# Patient Record
Sex: Male | Born: 1940 | Race: White | Hispanic: No | Marital: Married | Smoking: Former smoker
Health system: Southern US, Community
[De-identification: ages and names within clinical notes are randomized; demographics above are authoritative.]

## PROBLEM LIST (undated history)

## (undated) DIAGNOSIS — I2699 Other pulmonary embolism without acute cor pulmonale: Secondary | ICD-10-CM

## (undated) DIAGNOSIS — Z789 Other specified health status: Secondary | ICD-10-CM

## (undated) DIAGNOSIS — I10 Essential (primary) hypertension: Secondary | ICD-10-CM

## (undated) DIAGNOSIS — R760 Raised antibody titer: Secondary | ICD-10-CM

## (undated) DIAGNOSIS — J449 Chronic obstructive pulmonary disease, unspecified: Secondary | ICD-10-CM

## (undated) DIAGNOSIS — I2510 Atherosclerotic heart disease of native coronary artery without angina pectoris: Secondary | ICD-10-CM

## (undated) DIAGNOSIS — E785 Hyperlipidemia, unspecified: Secondary | ICD-10-CM

## (undated) DIAGNOSIS — E119 Type 2 diabetes mellitus without complications: Secondary | ICD-10-CM

## (undated) DIAGNOSIS — R911 Solitary pulmonary nodule: Secondary | ICD-10-CM

## (undated) DIAGNOSIS — K219 Gastro-esophageal reflux disease without esophagitis: Secondary | ICD-10-CM

## (undated) DIAGNOSIS — K8020 Calculus of gallbladder without cholecystitis without obstruction: Secondary | ICD-10-CM

## (undated) DIAGNOSIS — I272 Pulmonary hypertension, unspecified: Secondary | ICD-10-CM

## (undated) DIAGNOSIS — G4733 Obstructive sleep apnea (adult) (pediatric): Secondary | ICD-10-CM

## (undated) HISTORY — DX: Obstructive sleep apnea (adult) (pediatric): G47.33

## (undated) HISTORY — DX: Essential (primary) hypertension: I10

## (undated) HISTORY — PX: OTHER SURGICAL HISTORY: SHX169

## (undated) HISTORY — DX: Chronic obstructive pulmonary disease, unspecified: J44.9

## (undated) HISTORY — DX: Atherosclerotic heart disease of native coronary artery without angina pectoris: I25.10

## (undated) HISTORY — PX: COLONOSCOPY: SHX174

## (undated) HISTORY — DX: Hyperlipidemia, unspecified: E78.5

## (undated) HISTORY — DX: Other specified health status: Z78.9

## (undated) HISTORY — DX: Type 2 diabetes mellitus without complications: E11.9

---

## 2003-07-16 HISTORY — PX: CORONARY ARTERY BYPASS GRAFT: SHX141

## 2004-03-06 ENCOUNTER — Inpatient Hospital Stay (HOSPITAL_COMMUNITY): Admission: EM | Admit: 2004-03-06 | Discharge: 2004-03-19 | Payer: Self-pay | Admitting: Emergency Medicine

## 2004-03-14 ENCOUNTER — Encounter (INDEPENDENT_AMBULATORY_CARE_PROVIDER_SITE_OTHER): Payer: Self-pay | Admitting: Specialist

## 2004-05-16 ENCOUNTER — Ambulatory Visit: Payer: Self-pay | Admitting: Family Medicine

## 2004-06-26 ENCOUNTER — Ambulatory Visit: Payer: Self-pay | Admitting: Family Medicine

## 2004-07-02 ENCOUNTER — Ambulatory Visit: Payer: Self-pay | Admitting: Cardiology

## 2004-08-13 ENCOUNTER — Ambulatory Visit: Payer: Self-pay | Admitting: Family Medicine

## 2004-09-24 ENCOUNTER — Ambulatory Visit: Payer: Self-pay | Admitting: Family Medicine

## 2004-10-03 ENCOUNTER — Ambulatory Visit: Payer: Self-pay | Admitting: Family Medicine

## 2005-01-03 ENCOUNTER — Ambulatory Visit: Payer: Self-pay | Admitting: Family Medicine

## 2005-01-30 ENCOUNTER — Ambulatory Visit: Payer: Self-pay | Admitting: Family Medicine

## 2005-04-09 ENCOUNTER — Ambulatory Visit: Payer: Self-pay | Admitting: Family Medicine

## 2005-04-16 ENCOUNTER — Ambulatory Visit: Payer: Self-pay | Admitting: Family Medicine

## 2005-05-02 ENCOUNTER — Ambulatory Visit: Payer: Self-pay | Admitting: Cardiology

## 2005-05-02 ENCOUNTER — Inpatient Hospital Stay (HOSPITAL_COMMUNITY): Admission: EM | Admit: 2005-05-02 | Discharge: 2005-05-08 | Payer: Self-pay | Admitting: Emergency Medicine

## 2005-05-05 ENCOUNTER — Ambulatory Visit: Payer: Self-pay | Admitting: Cardiovascular Disease

## 2005-05-15 ENCOUNTER — Ambulatory Visit: Payer: Self-pay | Admitting: Cardiology

## 2005-05-19 ENCOUNTER — Inpatient Hospital Stay (HOSPITAL_COMMUNITY): Admission: AD | Admit: 2005-05-19 | Discharge: 2005-05-20 | Payer: Self-pay | Admitting: Cardiology

## 2005-05-29 ENCOUNTER — Ambulatory Visit: Payer: Self-pay | Admitting: Family Medicine

## 2005-06-20 ENCOUNTER — Ambulatory Visit: Payer: Self-pay | Admitting: Cardiology

## 2005-07-10 ENCOUNTER — Ambulatory Visit: Payer: Self-pay | Admitting: Family Medicine

## 2005-07-16 ENCOUNTER — Ambulatory Visit: Payer: Self-pay | Admitting: Family Medicine

## 2005-10-02 ENCOUNTER — Ambulatory Visit: Payer: Self-pay | Admitting: Cardiology

## 2005-10-22 ENCOUNTER — Ambulatory Visit: Payer: Self-pay | Admitting: Family Medicine

## 2005-11-12 ENCOUNTER — Ambulatory Visit: Payer: Self-pay | Admitting: Family Medicine

## 2005-11-21 ENCOUNTER — Ambulatory Visit: Payer: Self-pay | Admitting: Family Medicine

## 2006-01-21 ENCOUNTER — Ambulatory Visit: Payer: Self-pay | Admitting: Family Medicine

## 2006-02-27 ENCOUNTER — Encounter: Admission: RE | Admit: 2006-02-27 | Discharge: 2006-02-27 | Payer: Self-pay | Admitting: Neurosurgery

## 2006-03-25 ENCOUNTER — Encounter: Admission: RE | Admit: 2006-03-25 | Discharge: 2006-03-25 | Payer: Self-pay | Admitting: Neurosurgery

## 2006-04-30 ENCOUNTER — Ambulatory Visit: Payer: Self-pay | Admitting: Family Medicine

## 2006-05-14 ENCOUNTER — Ambulatory Visit: Payer: Self-pay | Admitting: Cardiology

## 2006-05-26 ENCOUNTER — Ambulatory Visit: Payer: Self-pay | Admitting: Cardiology

## 2006-06-09 ENCOUNTER — Ambulatory Visit: Payer: Self-pay | Admitting: Family Medicine

## 2006-06-19 ENCOUNTER — Inpatient Hospital Stay (HOSPITAL_COMMUNITY): Admission: RE | Admit: 2006-06-19 | Discharge: 2006-06-21 | Payer: Self-pay | Admitting: Neurosurgery

## 2006-08-05 ENCOUNTER — Ambulatory Visit: Payer: Self-pay | Admitting: Family Medicine

## 2006-08-19 ENCOUNTER — Ambulatory Visit: Payer: Self-pay | Admitting: Cardiology

## 2006-08-20 ENCOUNTER — Ambulatory Visit: Payer: Self-pay | Admitting: Cardiology

## 2006-08-20 ENCOUNTER — Observation Stay (HOSPITAL_COMMUNITY): Admission: AD | Admit: 2006-08-20 | Discharge: 2006-08-20 | Payer: Self-pay | Admitting: Cardiology

## 2006-08-25 ENCOUNTER — Ambulatory Visit: Payer: Self-pay | Admitting: Family Medicine

## 2006-08-28 ENCOUNTER — Ambulatory Visit: Payer: Self-pay | Admitting: Cardiology

## 2006-09-02 ENCOUNTER — Ambulatory Visit: Payer: Self-pay | Admitting: Family Medicine

## 2006-09-17 ENCOUNTER — Ambulatory Visit: Payer: Self-pay | Admitting: Family Medicine

## 2006-10-15 ENCOUNTER — Ambulatory Visit: Payer: Self-pay | Admitting: Family Medicine

## 2006-11-05 ENCOUNTER — Ambulatory Visit: Payer: Self-pay | Admitting: Family Medicine

## 2006-11-12 ENCOUNTER — Ambulatory Visit: Payer: Self-pay | Admitting: Family Medicine

## 2007-02-11 ENCOUNTER — Ambulatory Visit (HOSPITAL_COMMUNITY): Admission: RE | Admit: 2007-02-11 | Discharge: 2007-02-11 | Payer: Self-pay | Admitting: Neurosurgery

## 2007-08-27 ENCOUNTER — Encounter: Payer: Self-pay | Admitting: Cardiology

## 2008-06-02 ENCOUNTER — Encounter: Payer: Self-pay | Admitting: Cardiology

## 2008-06-02 ENCOUNTER — Ambulatory Visit: Payer: Self-pay | Admitting: Cardiology

## 2008-06-02 DIAGNOSIS — R05 Cough: Secondary | ICD-10-CM | POA: Insufficient documentation

## 2008-06-02 DIAGNOSIS — I2510 Atherosclerotic heart disease of native coronary artery without angina pectoris: Secondary | ICD-10-CM | POA: Insufficient documentation

## 2008-06-02 DIAGNOSIS — R55 Syncope and collapse: Secondary | ICD-10-CM | POA: Insufficient documentation

## 2008-06-13 ENCOUNTER — Ambulatory Visit: Payer: Self-pay | Admitting: Cardiology

## 2008-06-29 ENCOUNTER — Ambulatory Visit: Payer: Self-pay | Admitting: Cardiology

## 2008-07-01 ENCOUNTER — Inpatient Hospital Stay (HOSPITAL_BASED_OUTPATIENT_CLINIC_OR_DEPARTMENT_OTHER): Admission: RE | Admit: 2008-07-01 | Discharge: 2008-07-01 | Payer: Self-pay | Admitting: Internal Medicine

## 2008-07-01 ENCOUNTER — Ambulatory Visit: Payer: Self-pay | Admitting: Internal Medicine

## 2008-07-14 ENCOUNTER — Ambulatory Visit: Payer: Self-pay | Admitting: Physician Assistant

## 2008-11-03 ENCOUNTER — Encounter: Payer: Self-pay | Admitting: Cardiology

## 2008-11-03 ENCOUNTER — Emergency Department (HOSPITAL_COMMUNITY): Admission: EM | Admit: 2008-11-03 | Discharge: 2008-11-03 | Payer: Self-pay | Admitting: Emergency Medicine

## 2008-11-21 ENCOUNTER — Ambulatory Visit: Payer: Self-pay | Admitting: Cardiology

## 2009-02-02 ENCOUNTER — Telehealth: Payer: Self-pay | Admitting: Cardiology

## 2009-04-11 ENCOUNTER — Ambulatory Visit: Payer: Self-pay | Admitting: Cardiology

## 2009-04-11 DIAGNOSIS — E119 Type 2 diabetes mellitus without complications: Secondary | ICD-10-CM

## 2009-04-11 DIAGNOSIS — Z87891 Personal history of nicotine dependence: Secondary | ICD-10-CM

## 2009-04-11 DIAGNOSIS — J4489 Other specified chronic obstructive pulmonary disease: Secondary | ICD-10-CM | POA: Insufficient documentation

## 2009-04-11 DIAGNOSIS — Z951 Presence of aortocoronary bypass graft: Secondary | ICD-10-CM | POA: Insufficient documentation

## 2009-04-11 DIAGNOSIS — I252 Old myocardial infarction: Secondary | ICD-10-CM

## 2009-04-11 DIAGNOSIS — R072 Precordial pain: Secondary | ICD-10-CM

## 2009-04-11 DIAGNOSIS — I2510 Atherosclerotic heart disease of native coronary artery without angina pectoris: Secondary | ICD-10-CM

## 2009-04-11 DIAGNOSIS — R0602 Shortness of breath: Secondary | ICD-10-CM

## 2009-04-11 DIAGNOSIS — I10 Essential (primary) hypertension: Secondary | ICD-10-CM

## 2009-04-11 DIAGNOSIS — J449 Chronic obstructive pulmonary disease, unspecified: Secondary | ICD-10-CM

## 2009-04-11 DIAGNOSIS — I200 Unstable angina: Secondary | ICD-10-CM

## 2009-04-11 DIAGNOSIS — K219 Gastro-esophageal reflux disease without esophagitis: Secondary | ICD-10-CM

## 2009-04-11 DIAGNOSIS — Z9861 Coronary angioplasty status: Secondary | ICD-10-CM | POA: Insufficient documentation

## 2009-04-11 DIAGNOSIS — R0789 Other chest pain: Secondary | ICD-10-CM

## 2009-04-19 ENCOUNTER — Telehealth (INDEPENDENT_AMBULATORY_CARE_PROVIDER_SITE_OTHER): Payer: Self-pay | Admitting: *Deleted

## 2009-10-24 ENCOUNTER — Ambulatory Visit: Payer: Self-pay | Admitting: Cardiology

## 2010-06-12 ENCOUNTER — Ambulatory Visit: Payer: Self-pay | Admitting: Cardiology

## 2010-07-18 ENCOUNTER — Inpatient Hospital Stay (HOSPITAL_COMMUNITY)
Admission: AD | Admit: 2010-07-18 | Discharge: 2010-07-20 | Payer: Self-pay | Source: Home / Self Care | Attending: Cardiovascular Disease | Admitting: Cardiovascular Disease

## 2010-07-18 LAB — HEPARIN LEVEL (UNFRACTIONATED): Heparin Unfractionated: 0.3 IU/mL (ref 0.30–0.70)

## 2010-07-18 LAB — GLUCOSE, CAPILLARY
Glucose-Capillary: 97 mg/dL (ref 70–99)
Glucose-Capillary: 156 mg/dL — ABNORMAL HIGH (ref 70–99)

## 2010-07-19 LAB — GLUCOSE, CAPILLARY
Glucose-Capillary: 118 mg/dL — ABNORMAL HIGH (ref 70–99)
Glucose-Capillary: 107 mg/dL — ABNORMAL HIGH (ref 70–99)
Glucose-Capillary: 104 mg/dL — ABNORMAL HIGH (ref 70–99)
Glucose-Capillary: 92 mg/dL (ref 70–99)
Glucose-Capillary: 192 mg/dL — ABNORMAL HIGH (ref 70–99)

## 2010-07-19 LAB — CBC
HCT: 39.5 % (ref 39.0–52.0)
Hemoglobin: 13 g/dL (ref 13.0–17.0)
MCH: 29.6 pg (ref 26.0–34.0)
MCHC: 32.9 g/dL (ref 30.0–36.0)
MCV: 90 fL (ref 78.0–100.0)
Platelets: 227 10*3/uL (ref 150–400)
RBC: 4.39 MIL/uL (ref 4.22–5.81)
RDW: 14.7 % (ref 11.5–15.5)
WBC: 13.3 10*3/uL — ABNORMAL HIGH (ref 4.0–10.5)

## 2010-07-19 LAB — BASIC METABOLIC PANEL
BUN: 23 mg/dL (ref 6–23)
CO2: 25 mEq/L (ref 19–32)
Calcium: 8.1 mg/dL — ABNORMAL LOW (ref 8.4–10.5)
Chloride: 103 mEq/L (ref 96–112)
Creatinine, Ser: 1.33 mg/dL (ref 0.4–1.5)
GFR calc Af Amer: >60 mL/min (ref >60)
GFR calc non Af Amer: 53 mL/min — ABNORMAL LOW (ref >60)
Glucose, Bld: 122 mg/dL — ABNORMAL HIGH (ref 70–99)
Potassium: 3.8 mEq/L (ref 3.5–5.1)
Sodium: 135 mEq/L (ref 135–145)

## 2010-07-19 LAB — DIFFERENTIAL
Basophils Absolute: 0.1 10*3/uL (ref 0.0–0.1)
Basophils Relative: 1 % (ref 0–1)
Eosinophils Absolute: 0.1 10*3/uL (ref 0.0–0.7)
Eosinophils Relative: 1 % (ref 0–5)
Lymphocytes Relative: 21 % (ref 12–46)
Lymphs Abs: 2.8 10*3/uL (ref 0.7–4.0)
Monocytes Absolute: 1.3 10*3/uL — ABNORMAL HIGH (ref 0.1–1.0)
Monocytes Relative: 10 % (ref 3–12)
Neutro Abs: 9 10*3/uL — ABNORMAL HIGH (ref 1.7–7.7)
Neutrophils Relative %: 67 % (ref 43–77)

## 2010-07-19 LAB — PROTIME-INR
INR: 1.06 (ref 0.00–1.49)
Prothrombin Time: 14 seconds (ref 11.6–15.2)

## 2010-07-19 LAB — CARDIAC PANEL(CRET KIN+CKTOT+MB+TROPI)
CK, MB: 6.1 ng/mL (ref 0.3–4.0)
Relative Index: 0.9 (ref 0.0–2.5)
Total CK: 689 U/L — ABNORMAL HIGH (ref 7–232)
Troponin I: 2.73 ng/mL (ref 0.00–0.06)

## 2010-07-19 LAB — PLATELET INHIBITION P2Y12
P2Y12 % Inhibition: 0 %
Platelet Function  P2Y12: 243 [PRU] (ref 194–418)
Platelet Function Baseline: 238 [PRU] (ref 194–418)

## 2010-07-19 LAB — HEPARIN LEVEL (UNFRACTIONATED): Heparin Unfractionated: 0.42 IU/mL (ref 0.30–0.70)

## 2010-07-20 ENCOUNTER — Encounter: Payer: Self-pay | Admitting: Cardiology

## 2010-07-20 ENCOUNTER — Encounter: Payer: Self-pay | Admitting: Cardiovascular Disease

## 2010-07-20 LAB — BASIC METABOLIC PANEL
BUN: 21 mg/dL (ref 6–23)
CO2: 26 mEq/L (ref 19–32)
Calcium: 8.4 mg/dL (ref 8.4–10.5)
Chloride: 101 mEq/L (ref 96–112)
Creatinine, Ser: 1.26 mg/dL (ref 0.4–1.5)
GFR calc Af Amer: >60 mL/min (ref >60)
GFR calc non Af Amer: 57 mL/min — ABNORMAL LOW (ref >60)
Glucose, Bld: 104 mg/dL — ABNORMAL HIGH (ref 70–99)
Potassium: 3.9 mEq/L (ref 3.5–5.1)
Sodium: 135 mEq/L (ref 135–145)

## 2010-07-20 LAB — GLUCOSE, CAPILLARY
Glucose-Capillary: 121 mg/dL — ABNORMAL HIGH (ref 70–99)
Glucose-Capillary: 162 mg/dL — ABNORMAL HIGH (ref 70–99)

## 2010-07-20 LAB — CBC
HCT: 39.8 % (ref 39.0–52.0)
Hemoglobin: 12.8 g/dL — ABNORMAL LOW (ref 13.0–17.0)
MCH: 29.4 pg (ref 26.0–34.0)
MCHC: 32.2 g/dL (ref 30.0–36.0)
MCV: 91.3 fL (ref 78.0–100.0)
Platelets: 191 10*3/uL (ref 150–400)
RBC: 4.36 MIL/uL (ref 4.22–5.81)
RDW: 14.6 % (ref 11.5–15.5)
WBC: 8.6 10*3/uL (ref 4.0–10.5)

## 2010-07-30 LAB — POCT I-STAT 3, VENOUS BLOOD GAS (G3P V)
Acid-base deficit: 2 mmol/L (ref 0.0–2.0)
Bicarbonate: 22.8 mEq/L (ref 20.0–24.0)
O2 Saturation: 67 %
TCO2: 24 mmol/L (ref 0–100)
pCO2, Ven: 37.8 mmHg — ABNORMAL LOW (ref 45.0–50.0)
pH, Ven: 7.389 — ABNORMAL HIGH (ref 7.250–7.300)
pO2, Ven: 35 mmHg (ref 30.0–45.0)

## 2010-07-30 LAB — POCT I-STAT 3, ART BLOOD GAS (G3+)
Acid-base deficit: 2 mmol/L (ref 0.0–2.0)
Bicarbonate: 22.7 mEq/L (ref 20.0–24.0)
O2 Saturation: 97 %
TCO2: 24 mmol/L (ref 0–100)
pCO2 arterial: 36.1 mmHg (ref 35.0–45.0)
pH, Arterial: 7.408 (ref 7.350–7.450)
pO2, Arterial: 86 mmHg (ref 80.0–100.0)

## 2010-08-04 NOTE — Discharge Summary (Signed)
NAMEMCCORMICK, Henry [name]                ACCOUNT NO.:  [id number]  MEDICAL RECORD NO.:  [id number]          PATIENT TYPE:  INP  LOCATION:  2037                         FACILITY:  [facility]  PHYSICIAN:  Luis Abed, MD, FACCDATE OF BIRTH:  [birth year]  DATE OF ADMISSION:  07/18/2010 DATE OF DISCHARGE:  07/20/2010                              DISCHARGE SUMMARY   PRIMARY CARDIOLOGIST:  Learta Codding, MD,FACC.  PRIMARY CARE PHYSICIAN:  Suzy Bouchard  DISCHARGE DIAGNOSES: 1. Non-ST elevation myocardial infarction.     a.     Cardiac catheterization July 19, 2009:  Left main normal.      RCA 100% occlusion with retrograde fill via collaterals. 2. Left circumflex:  Long proximal stented section was segment of 40-     50% stenosis, proximal portion of third obtuse marginal is 100%     occluded, which is new since prior catheterization, distal OM with     bifurcation with segmental and sequential 70-80% stenosis     diffusely.  LAD extensive stents proximally with diffuse in-stent     restenosis with subsequent occlusion.  LIMA to LAD patent with good     flow to the distal LAD.  No opportunity for PCI appreciated.     Limited option could be CABG to distal left circumflex/distal RCA,     but distal RCA is too diseased for adjunctive revascularization.     Suggest medical therapy. 3. Bronchitis subsection.     a.     The patient received 5 days of Avelox and 5 days of cefepime      IV to complete 10-day course of antibiotics with a 5-day course of      Avelox 400 mg p.o. daily (no fevers for greater than 48 hours).      White count not elevated on date of discharge. 4. Syncopal episode (question vaguely mediated in the setting of     bronchitis and diarrhea).     a.     The patient's thiazide diuretic to be held until followup      with primary cardiologist, and the patient encouraged to increase      fluid intake over the next few days. 5. Chronic obstructive pulmonary disease  exacerbation/bronchitis.  SECONDARY DIAGNOSES: 1. Coronary artery disease.     a.     Non-ST elevated myocardial infarction with subsequent      coronary artery bypass graft x3 (LIMA - LAD, SVG - OM, SVG - PDA),      August 2005.     b.     Cardiac catheterization December 2009:  Severe native triple-      vessel disease, patent LIMA to LAD, chronically occluded SVG      grafts x2, approximately 50% tandem in-stent restenosis of the      left circumflex artery, treated medically. 2. Non-insulin-dependent diabetes mellitus. 3. Hypertension. 4. Dyslipidemia. 5. Gastroesophageal reflux disease. 6. Status post lumbar laminectomy, October 2007.  ALLERGIES:  NKDA.  PROCEDURES: 1. Cardiac catheterization July 19, 2010:  Please see discharge     diagnoses, cardiac catheterization July 19, 2009. 2. EKG July 20, 2010:  Normal sinus rhythm, inferior Q-waves,     question significant Q-waves in V1 through V3 with no significant     ST-T-wave changes (nonspecific changes in I and aVL), normal axis,     no evidence of hypertrophy.  Delayed R-wave progression, PR 146,     QRS 86, and QTc of 435.  HISTORY OF PRESENT ILLNESS:  [name] is a 70 year old Caucasian gentleman with [name] is a 70 year old Caucasian gentleman with the above-noted complex medical history who reported to [facility] ED on the morning of July 15, 2010, after he had a syncopal episode.  The patient awoke early in the morning, felt well, and was cooking in the kitchen, and over the course of the morning had to go to the bathroom twice and was ambulating with a sensation of dizziness. During the last bowel movement in the morning, he became diaphoretic while he was sitting on the commode and after getting up, he felt every weak and slumped to the floor.  The patient was found by his wife breathing heavily and drenched in sweat.  EMS was called and initial blood pressure was 87/50.  He was brought to [facility] ED, and after receiving 500 mL normal saline bolus,  blood pressure up to 125/75.  He had a similar episode several years ago while getting out of car.  He denies history of TIAs, CVAs, or any history of thyroid problems.  The patient does report severe cough over the prior 3 days to his presentation.  He has been treated by his primary care provider, but his symptoms have continued and actually worsened.  He has some minimal yellow sputum production.  No pleuritis or hemoptysis.  He denies vertigo, seizure disorder, or seizure activity.  He is a former smoker, quitting in 85s.  No other complaints and negative review of systems.  HOSPITAL COURSE:  The patient was noted to have elevation to his cardiac enzymes and troponin peak of 5.18.  Subsequently, transferred to [facility] on July 18, 2009, for further eval/cardiac catheterization. Of note on admission to [facility], he was initiated on IV antibiotics with cefepime and oral azithromycin.  He continued these for the duration of both his stay at [facility] and [facility].  The patient underwent cardiac catheterization as above.  Please see results in discharge diagnosis section, subsection A.  At this time, medical management is being pursued.  Of note, the patient has a VerifyNow test, which showed zero inhibition from Plavix, but this medication along with full strength aspirin is being continued now, and decision as to whether or not dual antiplatelet therapy in the setting of VerifyNow results should be continued will be made by his primary cardiologist.  The patient will complete a full 10-day course of antibiotics by switching to Avelox 400 mg p.o. for the next 5 days.  He has been instructed to follow up with his primary care provider regarding his upper respiratory symptoms.  Of note, the patient has chest x-ray at [facility], which was negative, blood and urine cultures, which were also negative. The patient had not had a fever for greater than 40 hours prior to  his discharge.  His white count was not elevated on the date of discharge. At the time of discharge, the patient received his new medication list, prescriptions, followup instructions, and all questions and concerns were addressed prior to leaving the hospital.  DISCHARGE LABORATORY DATA:  WBC is 8.6, HGB is 12.8, HCT 39.8, PLT count is 198, WBC differential on July 19, 2010,  notable for neutrophils at only 67%.  Sodium 135, potassium 3.9, chloride 101, bicarb 26, BUN 21, creatinine 1.26, glucose 104, calcium 8.4.  Peak troponin at [facility] of 5.18.  Full set of enzymes on July 19, 2010, at [facility] CK 689, MB 6.1, and troponin I of 2.73.  Platelet function P2Y12 243, platelet function baseline 238,  P2Y12 percent inhibition 0.  FOLLOWUP PLANS AND APPOINTMENTS:  Please see hospital course.  DISCHARGE MEDICATIONS: 1. Avelox 400 mg 1 tablet p.o. daily x5 days. 2. Acetaminophen 325 mg 1-2 tablets p.o. q.4 h. p.r.n. 3. Enteric-coated aspirin 325 mg p.o. daily. 4. Guaifenesin 600 mg 1 tablet p.o. b.i.d. 5. Loratadine 10 mg p.o. daily. 6. Sublingual nitroglycerin p.r.n. for chest discomfort. 7. Advair 250/50 one puff inhaled b.i.d. 8. Flonase 2 sprays in each nostril daily. 9. Isosorbide mononitrate 90 mg p.o. daily. 10.Lipitor 80 mg p.o. daily. 11.Losartan 100 mg p.o. daily. 12.Metformin 500 mg 1 tablet p.o. at bedtime (please restart on     July 21, 2010). 13.Metoprolol tartrate 75 mg p.o. b.i.d. 14.Plavix 75 mg p.o. daily. 15.Verapamil SR 240 mg p.o. daily.  DURATION OF DISCHARGE ENCOUNTER INCLUDING PHYSICIAN:  45 minutes.     Jarrett Ables, PAC   ______________________________ Luis Abed, MD, [facility]    MS/MEDQ  D:  07/20/2010  T:  07/21/2010  Job:  [id number]  cc:   Learta Codding, MD,FACC Suzy Bouchard  Electronically Signed by Jarrett Ables PAC on 07/25/2010 02:58:50 PM Electronically Signed by Willa Rough MD FACC on 08/04/2010 09:10:14 AM

## 2010-08-06 ENCOUNTER — Ambulatory Visit
Admission: RE | Admit: 2010-08-06 | Discharge: 2010-08-06 | Payer: Self-pay | Source: Home / Self Care | Attending: Cardiology | Admitting: Cardiology

## 2010-08-09 NOTE — Procedures (Addendum)
NAMEJAQUAN, SADOWSKY                ACCOUNT NO.:  [id number]  MEDICAL RECORD NO.:  [id number]          PATIENT TYPE:  INP  LOCATION:  2037                         FACILITY:  [facility]  PHYSICIAN:  Colleen Can. Deborah Chalk, M.D.DATE OF BIRTH:  [birth year]  DATE OF PROCEDURE:  07/19/2010 DATE OF DISCHARGE:                           CARDIAC CATHETERIZATION   HISTORY:  Mr. Plessinger has had previous bypass surgery with known total occlusion of saphenous vein grafts, had extensive stents.  He has had total occlusion of his right coronary artery and left anterior descending in-stent stenosis.  He presents with a non-ST elevation myocardial infarction who is now referred for right and left heart catheterization.  PROCEDURE:  Right and left heart catheterization with selective coronary angiography, left ventricular angiography, angiography left internal mammary artery graft.  TYPE AND SITE OF ENTRY:  Percutaneous right femoral artery, percutaneous right femoral vein.  CATHETERS:  A 5-French 4-curved Judkins right-left coronary catheters, 5- French pigtail ventriculographic catheter, 5-French left internal mammary graft catheter, 7-French Swan-Ganz thermodilution catheter.  CONTRAST MATERIAL:  Omnipaque.  MEDICATIONS GIVEN PRIOR TO PROCEDURE:  None.  MEDICATIONS GIVEN DURING THE PROCEDURE:  Versed 2 mg IV.  COMMENTS:  The patient tolerated the procedure well.  HEMODYNAMIC DATA:  The right aortic pressure showed an A-wave 19, V-wave of 18, mean of 18, RV pressure was 41/10-18.  Pulmonary artery pressure was 37/40, pulmonary wedge pressure's A-wave of 20, V-wave of 18, mean of 18, LV pressure was 114/9, aortic pressure 117/70, aortic saturation was 97%, pulmonary artery saturation of 67%.  Fick cardiac output was 5.0 with a cardiac index of 2.5.  ANGIOGRAPHIC DATA:  The left ventricular angiogram was performed in the RAO projection.  Overall cardiac size and silhouette were normal.  There was  mild apical hypokinesis.  Ejection fraction is 50%.  There is no mitral regurgitation, intracardiac calcification, or intracavitary filling defect.  Extensive stents were noted in the cardiac silhouette.  CORONARY ARTERIES: 1. Left main coronary artery is normal. 2. The right coronary artery is totally occluded in the proximal     portions.  There is retrograde fill by the left circumflex branch     that continued in the AV groove.  The left circumflex had a long proximal stented section proximally with sequential 40-50% stenosis.  The more proximal of three obtuse marginal is totally occluded which is a new finding from the previous catheterization.  Distal obtuse marginal has a bifurcation and is segmental and sequential 78% stenosis diffusely.  The distal portions of this distal obtuse marginal, possibly could be bypassed.  The left anterior descending had extensive stents proximally with diffuse in-stent restenosis and subsequent occlusion.  Left internal mammary artery graft to LAD is patent with good fill distally to the distal left anterior descending.  OVERALL IMPRESSION: 1. Well-preserved left ventricular function with mild apical     hypokinesis. 2. There is a new total occlusion of a branch of the left circumflex     marginal system that would account for the new findings. 3. Totally occluded right coronary artery, totally occluded left     anterior descending, and  newly occluded branch of left circumflex. 4. Totally occluded saphenous vein graft from previous catheterization     study. 5. Persistent patency of the left internal mammary graft to the LAD.  DISCUSSION:  It is felt that probably Mr. Beale will be best treated medically.  He is not a candidate for any repeat percutaneous interventions.  The distal circumflex is perhaps the only potential target for redo bypass surgery and the distal right coronary artery is so diffusely diseased and is retrograde fill  that I do not think he would be a candidate for surgery.  I think he is best managed with medical approach.     Colleen Can. Deborah Chalk, M.D.     SNT/MEDQ  D:  07/19/2010  T:  07/20/2010  Job:  [id number]  cc:   Maryann Conners, MD Cardiology, [facility]  Electronically Signed by Roger Shelter M.D. on 08/09/2010 03:36:14 PM

## 2010-08-10 ENCOUNTER — Ambulatory Visit
Admission: RE | Admit: 2010-08-10 | Discharge: 2010-08-10 | Payer: Self-pay | Source: Home / Self Care | Attending: Cardiology | Admitting: Cardiology

## 2010-08-15 NOTE — Assessment & Plan Note (Signed)
Summary: 6 MO FU PER OCT REMINDER   Visit Type:  Follow-up Primary Provider:  Joette Catching MD   History of Present Illness: the patient is a 70 year old male with a history of coronary artery disease.  He has two occluded vein grafts and patent Lima to the LAD.  His last catheterization was in December 2009.  He continues to decline any symptoms of chest pain or shortness of breath.  He has no orthopnea PND no palpitations or syncope.  From a cardiac standpoint is doing quite well.  Preventive Screening-Counseling & Management  Alcohol-Tobacco     Smoking Status: quit     Year Quit: 1974  Current Medications (verified): 1)  Cozaar 100 Mg Tabs (Losartan Potassium) .... Take 1 Tablet By Mouth Once A Day 2)  Imdur 60 Mg Xr24h-Tab (Isosorbide Mononitrate) .... Take 1 1/2 Tab (90mg ) By Mouth Daily 3)  Metoprolol Tartrate 50 Mg Tabs (Metoprolol Tartrate) .... Take 1 1/2 Tablet By Mouth Twice A Day 4)  Verapamil Hcl Cr 240 Mg Xr24h-Cap (Verapamil Hcl) .... Take 1 Tablet By Mouth Once A Day 5)  Plavix 75 Mg Tabs (Clopidogrel Bisulfate) .... Take 1 Tablet By Mouth Once A Day 6)  Lipitor 40 Mg Tabs (Atorvastatin Calcium) .... Take 1 Tablet By Mouth Once A Day 7)  Aspirin 81 Mg Tbec (Aspirin) .... Take 1 Tablet By Mouth Once A Day 8)  Advair Diskus 250-50 Mcg/dose Aepb (Fluticasone-Salmeterol) .... One Inhalation Twice Daily 9)  Singulair 10 Mg Tabs (Montelukast Sodium) .... Take 1 Tablet By Mouth Once A Day 10)  Nasonex 50 Mcg/act Susp (Mometasone Furoate) .... Two Sprays Per Nostril Daily 11)  Metformin Hcl 500 Mg Tabs (Metformin Hcl) .... Take 1 Tablet By Mouth Once A Day 12)  Nitrostat 0.4 Mg Subl (Nitroglycerin) .... Use As Directed 13)  Zegerid 40-1100 Mg Caps (Omeprazole-Sodium Bicarbonate) .... Take 1 Tablet By Mouth Once A Day 14)  Chlorthalidone 50 Mg Tabs (Chlorthalidone) .... Take 1 Tablet By Mouth Once A Day 15)  Tekturna 150 Mg Tabs (Aliskiren Fumarate) .... Take 1 Tablet By Mouth  Once A Day  Allergies (verified): No Known Drug Allergies  Comments:  Nurse/Medical Assistant: The patient's medication list and allergies were reviewed with the patient and were updated in the Medication and Allergy Lists.  Past History:  Past Medical History: Last updated: 04/11/2009  coronary artery disease status post non-ST elevationmyocardial infarction andstatus-post 3 vessel coronary bypass grafting in August of 2005 status post Cypher stenting of the circumflex in August 2006 normal ejection fraction hypertension poorly controlled type 2 diabetes mellitus dyslipidemia tobacco use (chews tobacco) ACE inhibitor intolerance with cough asthma history of recurrent bronchitis  Past Surgical History: Last updated: 04/11/2009 CARDIAC STENT 2006 CORONARYARTERY BYPASS 2005  Family History: Last updated: 04/11/2009 FAMILY HX-STROKE, DIABETES MELLITUS, NEOP PROSTATE  Social History: Last updated: 04/11/2009 Tobacco Use - Yes.  Retired  Married  Alcohol Use - no Drug Use - no  Risk Factors: Smoking Status: quit (06/12/2010) Cans of tobacco/wk: 3/pks (04/11/2009)  Vital Signs:  Patient profile:   70 year old male Height:      68 inches Weight:      185 pounds BMI:     28.23 Pulse rate:   74 / minute BP sitting:   152 / 88  (left arm) Cuff size:   large  Vitals Entered By: Carlye Grippe (June 12, 2010 1:45 PM)  Nutrition Counseling: Patient's BMI is greater than 25 and therefore counseled on weight  management options.  Physical Exam  Additional Exam:  General: Well-developed, well-nourished in no distress head: Normocephalic and atraumatic eyes PERRLA/EOMI intact, conjunctiva and lids normal nose: No deformity or lesions mouth normal dentition, normal posterior pharynx neck: Supple, no JVD.  No masses, thyromegaly or abnormal cervical nodes lungs: Normal breath sounds bilaterally without wheezing.  Normal percussion heart: regular rate and rhythm  with normal S1 and S2, no S3 or S4.  PMI is normal.  No pathological murmurs abdomen: Normal bowel sounds, abdomen is soft and nontender without masses, organomegaly or hernias noted.  No hepatosplenomegaly musculoskeletal: Back normal, normal gait muscle strength and tone normal pulsus: Pulse is normal in all 4 extremities Extremities: No peripheral pitting edema neurologic: Alert and oriented x 3 skin: Intact without lesions or rashes cervical nodes: No significant adenopathy psychologic: Normal affect    Impression & Recommendations:  Problem # 1:  POSTSURGICAL AORTOCORONARY BYPASS STATUS (ICD-V45.81) the patient is status post coronary artery bypass grafting and prior stent placement.  He has no recurrent chest pain.  His last catheterization was in 2009.  Continue risk factor modification.  Problem # 2:  HYPERTENSION (ICD-401.9) patient's blood pressure is well controlled.  We will continue current medical regimen. His updated medication list for this problem includes:    Cozaar 100 Mg Tabs (Losartan potassium) .Marland Kitchen... Take 1 tablet by mouth once a day    Metoprolol Tartrate 50 Mg Tabs (Metoprolol tartrate) .Marland Kitchen... Take 1 1/2 tablet by mouth twice a day    Verapamil Hcl Cr 240 Mg Xr24h-cap (Verapamil hcl) .Marland Kitchen... Take 1 tablet by mouth once a day    Aspirin 81 Mg Tbec (Aspirin) .Marland Kitchen... Take 1 tablet by mouth once a day    Chlorthalidone 50 Mg Tabs (Chlorthalidone) .Marland Kitchen... Take 1 tablet by mouth once a day    Tekturna 150 Mg Tabs (Aliskiren fumarate) .Marland Kitchen... Take 1 tablet by mouth once a day  Other Orders: EKG w/ Interpretation (93000)  Patient Instructions: 1)  Your physician recommends that you continue on your current medications as directed. Please refer to the Current Medication list given to you today. 2)  Follow up in  6 months

## 2010-08-15 NOTE — Assessment & Plan Note (Signed)
Summary: 6 MO F/U PER MARCH REMINDER-JM   Visit Type:  Follow-up Primary Provider:  Joette Catching MD   History of Present Illness: the patient is a 70 year old male with a history of coronary artery disease. He has 2 occluded vein grafts and a patent LIMA to the LAD. Last catheterization was in December of 2009. He currently denies any chest pain or shortness of breath. He recently went [place] hunting without any difficulty. Dr. Lysbeth Galas follows his laboratory work and his cholesterol is stable. He denies any orthopnea PND palpitations or syncope. However his blood pressure remains uncontrolled.  Preventive Screening-Counseling & Management  Alcohol-Tobacco     Smoking Status: quit     Year Quit: 1974  Current Medications (verified): 1)  Cozaar 100 Mg Tabs (Losartan Potassium) .... Take 1 Tablet By Mouth Once A Day 2)  Imdur 60 Mg Xr24h-Tab (Isosorbide Mononitrate) .... Take 1 1/2 Tab (90mg ) By Mouth Daily 3)  Metoprolol Tartrate 50 Mg Tabs (Metoprolol Tartrate) .... Take 1 1/2 Tablet By Mouth Twice A Day 4)  Verapamil Hcl Cr 240 Mg Xr24h-Cap (Verapamil Hcl) .... Take 1 Tablet By Mouth Once A Day 5)  Plavix 75 Mg Tabs (Clopidogrel Bisulfate) .... Take 1 Tablet By Mouth Once A Day 6)  Lipitor 40 Mg Tabs (Atorvastatin Calcium) .... Take 1 Tablet By Mouth Once A Day 7)  Aspirin 81 Mg Tbec (Aspirin) .... Take 1 Tablet By Mouth Once A Day 8)  Advair Diskus 250-50 Mcg/dose Aepb (Fluticasone-Salmeterol) .... One Inhalation Twice Daily 9)  Singulair 10 Mg Tabs (Montelukast Sodium) .... Take 1 Tablet By Mouth Once A Day 10)  Nasonex 50 Mcg/act Susp (Mometasone Furoate) .... Two Sprays Per Nostril Daily 11)  Glimepiride 2 Mg Tabs (Glimepiride) .... Take 1 Tablet By Mouth Once A Day 12)  Nitrostat 0.4 Mg Subl (Nitroglycerin) .... Use As Directed 13)  Zegerid 40-1100 Mg Caps (Omeprazole-Sodium Bicarbonate) .... Take 1 Tablet By Mouth Once A Day 14)  Chlorthalidone 50 Mg Tabs (Chlorthalidone) ....  Take 1 Tablet By Mouth Once A Day  Allergies (verified): No Known Drug Allergies  Comments:  Nurse/Medical Assistant: The patient's medications and allergies were reviewed with the patient and were updated in the Medication and Allergy Lists. List reviewed.  Past History:  Past Medical History: Last updated: 04/11/2009  coronary artery disease status post non-ST elevationmyocardial infarction andstatus-post 3 vessel coronary bypass grafting in August of 2005 status post Cypher stenting of the circumflex in August 2006 normal ejection fraction hypertension poorly controlled type 2 diabetes mellitus dyslipidemia tobacco use (chews tobacco) ACE inhibitor intolerance with cough asthma history of recurrent bronchitis  Past Surgical History: Last updated: 04/11/2009 CARDIAC STENT 2006 CORONARYARTERY BYPASS 2005  Family History: Last updated: 04/11/2009 FAMILY HX-STROKE, DIABETES MELLITUS, NEOP PROSTATE  Social History: Last updated: 04/11/2009 Tobacco Use - Yes.  Retired  Married  Alcohol Use - no Drug Use - no  Risk Factors: Smoking Status: quit (10/24/2009) Cans of tobacco/wk: 3/pks (04/11/2009)  Vital Signs:  Patient profile:   70 year old male Height:      68 inches Weight:      192 pounds Pulse rate:   71 / minute BP sitting:   147 / 87  (left arm) Cuff size:   large  Vitals Entered By: Carlye Grippe (October 24, 2009 10:08 AM)  Physical Exam  Additional Exam:  General: Well-developed, well-nourished in no distress head: Normocephalic and atraumatic eyes PERRLA/EOMI intact, conjunctiva and lids normal nose: No deformity or lesions  mouth normal dentition, normal posterior pharynx neck: Supple, no JVD.  No masses, thyromegaly or abnormal cervical nodes lungs: Normal breath sounds bilaterally without wheezing.  Normal percussion heart: regular rate and rhythm with normal S1 and S2, no S3 or S4.  PMI is normal.  No pathological murmurs abdomen: Normal  bowel sounds, abdomen is soft and nontender without masses, organomegaly or hernias noted.  No hepatosplenomegaly musculoskeletal: Back normal, normal gait muscle strength and tone normal pulsus: Pulse is normal in all 4 extremities Extremities: No peripheral pitting edema neurologic: Alert and oriented x 3 skin: Intact without lesions or rashes cervical nodes: No significant adenopathy psychologic: Normal affect    Impression & Recommendations:  Problem # 1:  POSTSURGICAL AORTOCORONARY BYPASS STATUS (ICD-V45.81) the patient reports no recurrent chest pain.we will continue on her current medical regimen.  Problem # 2:  HYPERTENSION (ICD-401.9) blood pressure is not optimally controlled and we will change hydrochlorothiazide to chlorthalidone 50 milligrams p.o. q. daily His updated medication list for this problem includes:    Cozaar 100 Mg Tabs (Losartan potassium) .Marland Kitchen... Take 1 tablet by mouth once a day    Metoprolol Tartrate 50 Mg Tabs (Metoprolol tartrate) .Marland Kitchen... Take 1 1/2 tablet by mouth twice a day    Verapamil Hcl Cr 240 Mg Xr24h-cap (Verapamil hcl) .Marland Kitchen... Take 1 tablet by mouth once a day    Aspirin 81 Mg Tbec (Aspirin) .Marland Kitchen... Take 1 tablet by mouth once a day    Chlorthalidone 50 Mg Tabs (Chlorthalidone) .Marland Kitchen... Take 1 tablet by mouth once a day  Problem # 3:  DM (ICD-250.00) followed by Dr. Lysbeth Galas. Laboratory work for lipid panel  also followed by him His updated medication list for this problem includes:    Cozaar 100 Mg Tabs (Losartan potassium) .Marland Kitchen... Take 1 tablet by mouth once a day    Aspirin 81 Mg Tbec (Aspirin) .Marland Kitchen... Take 1 tablet by mouth once a day    Glimepiride 2 Mg Tabs (Glimepiride) .Marland Kitchen... Take 1 tablet by mouth once a day  Patient Instructions: 1)  Stop HCTZ 2)  Start Chlorthalidone 50mg   3)  Follow up in  6 months Prescriptions: CHLORTHALIDONE 50 MG TABS (CHLORTHALIDONE) Take 1 tablet by mouth once a day  #30 x 6   Entered by:   Hoover Brunette, LPN   Authorized  by:   Lewayne Bunting, MD, [facility]   Signed by:   Hoover Brunette, LPN on 04/54/0981   Method used:   [facility] to        [facility] [phone]* (retail)       [address]       [place], [place]  [address]       Ph: [phone] or [phone]       Fax: [phone]   RxID:   [id number]   Handout requested.

## 2010-08-16 NOTE — Assessment & Plan Note (Signed)
Summary: BP/HR CHECK-JM  Nurse Visit   Vital Signs:  Patient profile:   70 year old male Height:      68 inches Weight:      186 pounds Pulse rate:   62 / minute BP sitting:   107 / 69  (left arm) Cuff size:   regular  Vitals Entered By: Carlye Grippe (August 10, 2010 8:49 AM)  Past History:  Past Medical History: Last updated: 04/11/2009  coronary artery disease status post non-ST elevationmyocardial infarction andstatus-post 3 vessel coronary bypass grafting in August of 2005 status post Cypher stenting of the circumflex in August 2006 normal ejection fraction hypertension poorly controlled type 2 diabetes mellitus dyslipidemia tobacco use (chews tobacco) ACE inhibitor intolerance with cough asthma history of recurrent bronchitis   Visit Type:  nurse visit  CC:  bp check.  CC: bp check Comments [id number] HTN--yes [id number] meds?--yes Side effects?--no Chest pain, SOB, Dizziness?--no A/P: 1. HTN (401.1)             At goal?              If no, physician will be notified.              Follow up in ...Marland KitchenMarland KitchenMarland Kitchen  5 minutes was spent with the patient.       Preventive Screening-Counseling & Management  Alcohol-Tobacco     Cans of tobacco/week: chew 5-6 packs tobacco/week     Tobacco Counseling: not to resume use of tobacco products  Current Medications (verified): 1)  Cozaar 100 Mg Tabs (Losartan Potassium) .... Take 1 Tablet By Mouth Once A Day 2)  Isosorbide Mononitrate Cr 120 Mg Xr24h-Tab (Isosorbide Mononitrate) .... Take One Tablet By Mouth Daily 3)  Metoprolol Tartrate 50 Mg Tabs (Metoprolol Tartrate) .... Take 1 1/2 Tablet By Mouth Twice A Day 4)  Verapamil Hcl Cr 240 Mg Xr24h-Cap (Verapamil Hcl) .... Take 1 Tablet By Mouth Every Morning. 5)  Plavix 75 Mg Tabs (Clopidogrel Bisulfate) .... Take 1 Tablet By Mouth Once A Day 6)  Lipitor 40 Mg Tabs (Atorvastatin Calcium) .... Take 1 Tablet By Mouth Once A Day 7)  Aspirin 325 Mg Tabs (Aspirin) ....  Take 1 Tablet By Mouth Once A Day 8)  Advair Diskus 250-50 Mcg/dose Aepb (Fluticasone-Salmeterol) .... One Inhalation Twice Daily 9)  Singulair 10 Mg Tabs (Montelukast Sodium) .... Take 1 Tablet By Mouth Once A Day 10)  Nasonex 50 Mcg/act Susp (Mometasone Furoate) .... Two Sprays Per Nostril Daily 11)  Metformin Hcl 500 Mg Tabs (Metformin Hcl) .... Take 1 Tablet By Mouth Once A Day 12)  Nitrostat 0.4 Mg Subl (Nitroglycerin) .... Use As Directed 13)  Zegerid 40-1100 Mg Caps (Omeprazole-Sodium Bicarbonate) .... Take 1 Tablet By Mouth Once A Day 14)  Chlorthalidone 50 Mg Tabs (Chlorthalidone) .... Take 1 Tablet By Mouth Once A Day 15)  Verapamil Hcl Cr 120 Mg Xr24h-Cap (Verapamil Hcl) .... Take One Capsule By Mouth Every Evening.  Allergies (verified): No Known Drug Allergies  Comments:  Nurse/Medical Assistant: The patient's medication list and allergies were reviewed with the patient and were updated in the Medication and Allergy Lists.  Orders Added: 1)  Est. Patient Level I [82956] Blood pressure very good no further adjustments in medications. Lewayne Bunting, MD, [facility]  August 10, 2010 12:05 PM  Patient informed via message machine of the above.Carlye Grippe  August 10, 2010 3:08 PM

## 2010-08-16 NOTE — Assessment & Plan Note (Signed)
Summary: EPH-POST [facility] 1/6?   Primary Provider:  Joette Catching MD   History of Present Illness: the patient is a 70 year old male with a history of coronary artery disease status post prior bypass surgery.  The patient presented with a non-ST elevation myocardial infarction July 15, 2010.  Reportedly the patient had symptoms of bronchitis and diarrhea he was very dehydrated and when he coughed he had a syncopal episode.  On arrival in the emergency room his blood pressure was very low approximately 80 systolic.  He was given IV fluids and transferred for cardiac catheterization.  He was found to have well preserved LV function with mild apical hypokinesis but with a new total occlusion of the branch of the left circumflex marginal system.  It also the newly occluded branch of the left circumflex.  He persistent patency of the left internal mammary artery to the LAD.  It was felt that the patient could be best managed medically.  Since his episode the patient appears to have chronic stable angina.  Interestingly on exertion however he has no chest pain but does have some shortness of breath.  On occasion he has central chest pain which will last a few minutes and resolves with sublingual nitroglycerin.  Unfortunately continues to chew tobacco but otherwise compliant with his medical regimen.  I also advised the patient to stop chewing tobacco.  Preventive Screening-Counseling & Management  Alcohol-Tobacco     Smoking Status: quit     Year Quit: 1974  Current Medications (verified): 1)  Cozaar 100 Mg Tabs (Losartan Potassium) .... Take 1 Tablet By Mouth Once A Day 2)  Imdur 60 Mg Xr24h-Tab (Isosorbide Mononitrate) .... Take 1 1/2 Tab (90mg ) By Mouth Daily 3)  Metoprolol Tartrate 50 Mg Tabs (Metoprolol Tartrate) .... Take 1 1/2 Tablet By Mouth Twice A Day 4)  Verapamil Hcl Cr 240 Mg Xr24h-Cap (Verapamil Hcl) .... Take 1 Tablet By Mouth Once A Day 5)  Plavix 75 Mg Tabs (Clopidogrel  Bisulfate) .... Take 1 Tablet By Mouth Once A Day 6)  Lipitor 40 Mg Tabs (Atorvastatin Calcium) .... Take 1 Tablet By Mouth Once A Day 7)  Aspirin 325 Mg Tabs (Aspirin) .... Take 1 Tablet By Mouth Once A Day 8)  Advair Diskus 250-50 Mcg/dose Aepb (Fluticasone-Salmeterol) .... One Inhalation Twice Daily 9)  Singulair 10 Mg Tabs (Montelukast Sodium) .... Take 1 Tablet By Mouth Once A Day 10)  Nasonex 50 Mcg/act Susp (Mometasone Furoate) .... Two Sprays Per Nostril Daily 11)  Metformin Hcl 500 Mg Tabs (Metformin Hcl) .... Take 1 Tablet By Mouth Once A Day 12)  Nitrostat 0.4 Mg Subl (Nitroglycerin) .... Use As Directed 13)  Zegerid 40-1100 Mg Caps (Omeprazole-Sodium Bicarbonate) .... Take 1 Tablet By Mouth Once A Day 14)  Chlorthalidone 50 Mg Tabs (Chlorthalidone) .... Take 1 Tablet By Mouth Once A Day 15)  Tekturna 150 Mg Tabs (Aliskiren Fumarate) .... Take 1 Tablet By Mouth Once A Day  Allergies (verified): No Known Drug Allergies  Past History:  Past Medical History: Last updated: 04/11/2009  coronary artery disease status post non-ST elevationmyocardial infarction andstatus-post 3 vessel coronary bypass grafting in August of 2005 status post Cypher stenting of the circumflex in August 2006 normal ejection fraction hypertension poorly controlled type 2 diabetes mellitus dyslipidemia tobacco use (chews tobacco) ACE inhibitor intolerance with cough asthma history of recurrent bronchitis  Past Surgical History: Last updated: 04/11/2009 CARDIAC STENT 2006 CORONARYARTERY BYPASS 2005  Family History: Last updated: 04/11/2009 FAMILY HX-STROKE, DIABETES  MELLITUS, NEOP PROSTATE  Social History: Last updated: 04/11/2009 Tobacco Use - Yes.  Retired  Married  Alcohol Use - no Drug Use - no  Risk Factors: Smoking Status: quit (08/06/2010) Cans of tobacco/wk: 3/pks (04/11/2009)  Review of Systems       The patient complains of chest pain and shortness of breath.  The patient  denies fatigue, malaise, fever, weight gain/loss, vision loss, decreased hearing, hoarseness, palpitations, prolonged cough, wheezing, sleep apnea, coughing up blood, abdominal pain, blood in stool, nausea, vomiting, diarrhea, heartburn, incontinence, blood in urine, muscle weakness, joint pain, leg swelling, rash, skin lesions, headache, fainting, dizziness, depression, anxiety, enlarged lymph nodes, easy bruising or bleeding, and environmental allergies.    Vital Signs:  Patient profile:   70 year old male Weight:      187.25 pounds Pulse rate:   68 / minute BP sitting:   132 / 82  (left arm) Cuff size:   regular  Vitals Entered By: Hoover Brunette, LPN (August 06, 2010 3:31 PM) Is Patient Diabetic? Yes Comments post hosp    Physical Exam  Additional Exam:  General: Well-developed, well-nourished in no distress head: Normocephalic and atraumatic eyes PERRLA/EOMI intact, conjunctiva and lids normal nose: No deformity or lesions mouth poor dentition, normal posterior pharynx neck: Supple, no JVD.  No masses, thyromegaly or abnormal cervical nodes lungs: Normal breath sounds bilaterally without wheezing.  Normal percussion heart: regular rate and rhythm with normal S1 and S2, no S3 or S4.  PMI is normal.  No pathological murmurs abdomen: Normal bowel sounds, abdomen is soft and nontender without masses, organomegaly or hernias noted.  No hepatosplenomegaly musculoskeletal: Back normal, normal gait muscle strength and tone normal pulsus: Pulse is normal in all 4 extremities Extremities: No peripheral pitting edema neurologic: Alert and oriented x 3 skin: Intact without lesions or rashes cervical nodes: No significant adenopathy psychologic: Normal affect    Cardiac Cath  Procedure date:  07/19/2010  Findings:      well-preserved LV function with mild apical hypokinesis new total occlusion of the branch of the left circumflex marginal system totally occluded right coronary  artery, totally occluded left anterior descending, and newly occluded branch of the left circumflex. Totally occluded softness vein graft from previous catheterization study persistent patency of the left internal mammary graft to the LAD  Impression & Recommendations:  Problem # 1:  CHEST DISCOMFORT (ICD-786.59) patient does have chronic stable angina.  We will increase his verapamil CR 240 mg in the morning and verapamil CR 120 mg in the evening.  I will also increase his Imdur to 120mg  by mouth qdaily.  His updated medication list for this problem includes:    Isosorbide Mononitrate Cr 120 Mg Xr24h-tab (Isosorbide mononitrate) .Marland Kitchen... Take one tablet by mouth daily    Metoprolol Tartrate 50 Mg Tabs (Metoprolol tartrate) .Marland Kitchen... Take 1 1/2 tablet by mouth twice a day    Verapamil Hcl Cr 240 Mg Xr24h-cap (Verapamil hcl) .Marland Kitchen... Take 1 tablet by mouth every morning.    Plavix 75 Mg Tabs (Clopidogrel bisulfate) .Marland Kitchen... Take 1 tablet by mouth once a day    Aspirin 325 Mg Tabs (Aspirin) .Marland Kitchen... Take 1 tablet by mouth once a day    Nitrostat 0.4 Mg Subl (Nitroglycerin) ..... Use as directed    Verapamil Hcl Cr 120 Mg Xr24h-cap (Verapamil hcl) .Marland Kitchen... Take one capsule by mouth every evening.  Problem # 2:  CORONARY ATHEROSCLEROSIS NATIVE CORONARY ARTERY (ICD-414.01) patient is status-post catheterization.  It was felt  that the total occlusion of the branch of the left circumflex could best. treated medically. adjusted his medical regimen as above His updated medication list for this problem includes:    Isosorbide Mononitrate Cr 120 Mg Xr24h-tab (Isosorbide mononitrate) .Marland Kitchen... Take one tablet by mouth daily    Metoprolol Tartrate 50 Mg Tabs (Metoprolol tartrate) .Marland Kitchen... Take 1 1/2 tablet by mouth twice a day    Verapamil Hcl Cr 240 Mg Xr24h-cap (Verapamil hcl) .Marland Kitchen... Take 1 tablet by mouth every morning.    Plavix 75 Mg Tabs (Clopidogrel bisulfate) .Marland Kitchen... Take 1 tablet by mouth once a day    Aspirin 325 Mg Tabs  (Aspirin) .Marland Kitchen... Take 1 tablet by mouth once a day    Nitrostat 0.4 Mg Subl (Nitroglycerin) ..... Use as directed    Verapamil Hcl Cr 120 Mg Xr24h-cap (Verapamil hcl) .Marland Kitchen... Take one capsule by mouth every evening.  Problem # 3:  HYPERTENSION (ICD-401.9) a simplified to patient's medical regimen and particularly Tekturna in the recent data to not combine it with ACE or ARB.  Hopefully verapamil will be sufficient for blood pressure control he felt we will add a low dose of Minipress. The following medications were removed from the medication list:    Tekturna 150 Mg Tabs (Aliskiren fumarate) .Marland Kitchen... Take 1 tablet by mouth once a day His updated medication list for this problem includes:    Cozaar 100 Mg Tabs (Losartan potassium) .Marland Kitchen... Take 1 tablet by mouth once a day    Metoprolol Tartrate 50 Mg Tabs (Metoprolol tartrate) .Marland Kitchen... Take 1 1/2 tablet by mouth twice a day    Verapamil Hcl Cr 240 Mg Xr24h-cap (Verapamil hcl) .Marland Kitchen... Take 1 tablet by mouth every morning.    Aspirin 325 Mg Tabs (Aspirin) .Marland Kitchen... Take 1 tablet by mouth once a day    Chlorthalidone 50 Mg Tabs (Chlorthalidone) .Marland Kitchen... Take 1 tablet by mouth once a day    Verapamil Hcl Cr 120 Mg Xr24h-cap (Verapamil hcl) .Marland Kitchen... Take one capsule by mouth every evening.  Patient Instructions: 1)  Your physician wants you to follow-up in: 3 months. You will receive a reminder letter in the mail one-two months in advance. If you don't receive a letter, please call our office to schedule the follow-up appointment. 2)  Return on Friday, August 10, 2010 at 8:30am for nurse visit for Blood pressure check. 3)  Increase Verapamil to 240mg  every morning and 120mg  every evening. A new prescription for the 120mg  tablets was sent to your pharmacy for you to take at night. 4)  Increase Imdur (isosorbide) to 120mg  by mouth once daily. You may take 2 of your 60mg  tabets until gone and then get new perscription filled for 120mg  tablets.  5)  Stop  Tekturna. Prescriptions: ISOSORBIDE MONONITRATE CR 120 MG XR24H-TAB (ISOSORBIDE MONONITRATE) Take one tablet by mouth daily  #30 x 6   Entered by:   Cyril Loosen, RN, BSN   Authorized by:   Lewayne Bunting, MD, [facility]   Signed by:   Cyril Loosen, RN, BSN on 08/06/2010   Method used:   [facility]        [facility] [phone][facility] (retail)       [address]       [place], [place]  [address]       Ph: [phone] or [phone]       Fax: [phone]   RxID:   [id number] VERAPAMIL HCL CR 120  MG XR24H-CAP (VERAPAMIL HCL) Take one capsule by mouth every evening.  #30 x 6   Entered by:   Cyril Loosen, RN, BSN   Authorized by:   Lewayne Bunting, MD, [facility]   Signed by:   Cyril Loosen, RN, BSN on 08/06/2010   Method used:   [facility] to        [facility] [phone]* (retail)       [address]       [place], [place]  [address]       Ph: [phone] or [phone]       Fax: [phone]   RxID:   [id number]

## 2010-09-07 ENCOUNTER — Encounter: Payer: Self-pay | Admitting: Cardiology

## 2010-10-24 LAB — DIFFERENTIAL
Basophils Absolute: 0.1 10*3/uL (ref 0.0–0.1)
Basophils Relative: 1 % (ref 0–1)
Eosinophils Absolute: 0.5 10*3/uL (ref 0.0–0.7)
Eosinophils Relative: 4 % (ref 0–5)
Lymphocytes Relative: 18 % (ref 12–46)
Lymphs Abs: 2.1 10*3/uL (ref 0.7–4.0)
Monocytes Absolute: 0.7 10*3/uL (ref 0.1–1.0)
Monocytes Relative: 6 % (ref 3–12)
Neutro Abs: 8.3 10*3/uL — ABNORMAL HIGH (ref 1.7–7.7)
Neutrophils Relative %: 71 % (ref 43–77)

## 2010-10-24 LAB — CBC
HCT: 47.5 % (ref 39.0–52.0)
Hemoglobin: 16 g/dL (ref 13.0–17.0)
MCHC: 33.8 g/dL (ref 30.0–36.0)
MCV: 91.1 fL (ref 78.0–100.0)
Platelets: 303 10*3/uL (ref 150–400)
RBC: 5.21 MIL/uL (ref 4.22–5.81)
RDW: 14.7 % (ref 11.5–15.5)
WBC: 11.7 10*3/uL — ABNORMAL HIGH (ref 4.0–10.5)

## 2010-10-24 LAB — BASIC METABOLIC PANEL
BUN: 15 mg/dL (ref 6–23)
CO2: 23 mEq/L (ref 19–32)
Calcium: 9.2 mg/dL (ref 8.4–10.5)
Chloride: 103 mEq/L (ref 96–112)
Creatinine, Ser: 1.18 mg/dL (ref 0.4–1.5)
GFR calc Af Amer: >60 mL/min (ref >60)
GFR calc non Af Amer: >60 mL/min (ref >60)
Glucose, Bld: 150 mg/dL — ABNORMAL HIGH (ref 70–99)
Potassium: 3.7 mEq/L (ref 3.5–5.1)
Sodium: 136 mEq/L (ref 135–145)

## 2010-10-24 LAB — URINALYSIS, ROUTINE W REFLEX MICROSCOPIC
Glucose, UA: NEGATIVE mg/dL
Ketones, ur: 15 mg/dL — AB
Nitrite: NEGATIVE
Protein, ur: 100 mg/dL — AB
Specific Gravity, Urine: 1.03 (ref 1.005–1.030)
Urobilinogen, UA: 1 mg/dL (ref 0.0–1.0)
pH: 5.5 (ref 5.0–8.0)

## 2010-10-24 LAB — LIPASE, BLOOD: Lipase: 31 U/L (ref 11–59)

## 2010-11-27 NOTE — Cardiovascular Report (Signed)
Henry [name], Henry [name]                ACCOUNT NO.:  [id number]   MEDICAL RECORD NO.:  [id number]          PATIENT TYPE:  OIB   LOCATION:  1963                         FACILITY:  [facility]   PHYSICIAN:  Henry [name], MDDATE OF BIRTH:  Jun 28, 1941   DATE OF PROCEDURE:  07/01/2008  DATE OF DISCHARGE:  07/01/2008                            CARDIAC CATHETERIZATION   CARDIOLOGIST:  Henry Codding, MD,FACC.   PRIMARY CARE PHYSICIAN:  Henry Cloud. Kriste Basque, MD.   INDICATIONS:  Henry [name] is a 70 year old male with a history of  coronary artery disease and diabetes.  He is status post bypass surgery  in 2005 and a followup catheterization in 2006.  He had a total  occlusion of his 2 vein grafts with patency of the LIMA.  He had high-  grade proximal left circumflex stenosis and this was stented by Dr.  Samule Ohm.   He recently has been having mostly atypical chest pain.  However, he did  have a stress test which showed possibility of reversible ischemia in  the lateral wall.  He is thus referred for cardiac catheterization.   PROCEDURES PERFORMED:  1. Selective coronary angiography.  2. Saphenous vein graft angiography.  3. LIMA angiography.  4. Left heart cath.  5. Left ventriculogram.   DESCRIPTION OF THE PROCEDURE:  The risks and indication were explained.  A 4-French arterial sheath was placed in the right femoral artery using  modified Seldinger technique.  Standard catheters including JL-4, 3-D  RCA, and angled pigtail were used for procedure.  All catheter exchanges  made over wire.  There are no apparent complications.   Central aortic pressure 116/62 with a mean of 87.  LV was 107/10 with an  EDP of 15.   Left main had some mild plaquing.   LAD had an 80% lesion proximally and then had diffuse disease followed  by total occlusion in the midsection after septal perforator.  The left  circumflex gave off OM-1 and distal posterolateral.  Within there was a  long area of stenting in the  proximal and midportion.  Within the stent  there were tandem 40-50% lesion.  These were nonflow limiting.  In the  OM, there was a 50-60% diffuse tubular disease.  In posterolateral,  there was a diffuse 70% stenosis.  There was a large collateral coming  to the distal right coronary artery.   Right coronary artery is totally occluded proximally.  Once again, there  is a prominent collateral coming off the left circumflex filling what  appears to be a posterolateral or a PDA branch.   Saphenous vein graft to the right coronary artery is totally occluded.  This was chronic.   Saphenous vein graft to the left circumflex system was not injected, but  shown to be previously occluded.   LIMA to the LAD was widely patent with good flow of the LAD.  In the  apical LAD, there was a 60-70% focal stenosis.   Left ventriculogram done in the RAO position shows an EF of 65-70% with  no regional wall motion abnormalities.   ASSESSMENT:  1.  Severe native three-vessel coronary artery disease.  2. Left internal mammary artery graft to the left anterior descending      coronary artery is patent.  3. Saphenous vein graft x2 are occluded which is chronic.  4. 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  These are nonflow limiting.  5. Normal left ventricular function upon panning down over the      abdominal aorta after the left ventriculogram.  There was some      calcification, but no aneurysm.   PLAN:  I reviewed the films with Dr. Juanda Chance and I feel that lesions in  the left circumflex stent are nonobstructive.  We will proceed with  medical therapy.      Henry Buckles. Bensimhon, MD  Electronically Signed     DRB/MEDQ  D:  07/01/2008  T:  07/01/2008  Job:  [id number]   cc:   Henry Codding, MD,FACC  Henry Cloud. Kriste Basque, MD

## 2010-11-27 NOTE — Assessment & Plan Note (Signed)
[facility]                          [facility]   NAME:Henry [name], Henry [name]                       MRN:          [id number]  DATE:11/21/2008                            DOB:          07-12-1941    HISTORY OF PRESENT ILLNESS:  The patient is a 70 year old male who chews  tobacco.  The patient has known coronary artery disease.  He did undergo  cardiac catheterization in December 2009 due to recurrent chest pain.  The patient has stable anatomy with known 100% occlusion of both vein  graft but with patent LIMA to the LAD.  It was felt he could be treated  medically.  In retrospect, however the patient's chest pain appears to  be noncardiac.  It is reproducible by palpation of his chest and occurs  mainly at night when he lays on his left side.  The patient appears to  have ongoing sternotomy pain post CABG.  This pain does not resolve with  nitroglycerin either.   The patient now comes in for routine visit.  Unfortunately, his blood  pressure is 201/102, but is in significant pain.  He states that he was  recently at [facility] for renal colic and found a large kidney stone.  He also required antibiotics for urinary tract infection.  He is now  referred by his primary care physician to a urologist.  The patient  states that he is not certain why his blood pressure is markedly  elevated.  He is currently in pain and he actually takes several over-  the-counter pain medications including Goody powders.  From a cardiac  standpoint, however he is stable with no shortness of breath and as  related above his chest pain is noncardiac.   MEDICATIONS:  1. Plavix 75 mg p.o. daily.  2. Lipitor 40 mg p.o. daily.  3. Diovan 160 mg p.o. daily.  4. Metoprolol 75 mg twice a day.  5. Verapamil 120 mg p.o. daily.  6. Aspirin 81 mg p.o. daily.  7. Advair 250/50 mg p.o. daily.  8. Singulair 10 mg p.o. daily.  9. Nasonex 50 mg p.o. daily.  10.Glimepiride 2  a day.  11.NitroQuick as directed.  12.Zegerid.  13.Isosorbide mononitrate 60 mg p.o. daily.   PHYSICAL EXAMINATION:  VITAL SIGNS:  Blood pressure is 201/102, heart  rate is 82 beats per minute, weight is 189 pounds.  GENERAL:  Well-nourished white male in no apparent distress.  HEENT:  Pupils; eyes are clear.  Conjunctivae clear.  NECK:  Supple normal carotid upstroke and no carotid bruits.  LUNGS:  Clear breath sounds bilaterally.  HEART:  Regular rate and rhythm.  Normal S1 and S2.  No murmur, rubs, or  gallops.  ABDOMEN:  Soft, nontender.  No rebound or guarding and good bowel  sounds.  EXTREMITIES:  No cyanosis, clubbing, or edema.  NEURO:  The patient is alert, oriented grossly nonfocal.   PROBLEM LIST:  1. Multivessel coronary artery disease.      a.     Stable anatomy by catheterization, December 2009 with a  known 100% occlusion of both vein grafts, patent left internal       mammary artery to the left anterior descending graft, 50% tandem       in-stent restenosis of the left circumflex artery treated       medically.      b.     Status post non-STEMI/three-vessel coronary artery bypass       graft in August 2005.      c.     Status post Cypher stenting of the circumflex in August       2006.  2. Normal ejection fraction.  3. Hypertension, poorly controlled.  4. Type 2 diabetes mellitus.  5. Dyslipidemia.  6. Tobacco use (chews tobacco).  7. Angiotensin-converting enzyme inhibitor intolerance with cough.  8. Asthma.  9. History of recurrent bronchitis.   PLAN:  1. The patient's blood pressures are very poorly controlled.  This      could be in the setting of increased nonsteroidal use as well as      pain.  I did increase the patient's verapamil to 240 mg  p.o. daily      and Imdur to 90 mg p.o. daily.  We will have him followup for blood      pressure check.  2. The patient will need to be referred as soon as possible to a      urologist.  This was done by  Dr. Lysbeth Galas at [facility], but the patient      wants now to go to [facility] and we have given him a      referral hopefully for the next couple of days.  3. I have told the patient to recheck his blood pressure, remains      elevated we will need to follow up with him.  4. I have also told the patient that if he needs lithotripsy or      surgical removal of his kidney stone, Plavix will need to be      discontinued a  week prior to the procedure.     Learta Codding, MD,FACC  Electronically Signed    GED/MedQ  DD: 11/21/2008  DT: 11/22/2008  Job #: [id number]   cc:   Delaney Meigs, M.D.

## 2010-11-27 NOTE — Assessment & Plan Note (Signed)
[facility]                          [facility]   NAME:Henry [name], Henry [name]                       MRN:          [id number]  DATE:07/14/2008                            DOB:          April 29, 1941    PRIMARY CARDIOLOGIST:  Learta Codding, MD, [facility]   REASON FOR VISIT:  Post catheterization followup.   Henry [name] returns to the clinic after undergoing recent elective  cardiac catheterization in the [facility] lab.  He was referred by Dr. Andee Lineman,  for further evaluation of chest pain in the setting of known coronary  artery disease and a recent abnormal dobutamine stress Cardiolite test.  Cardiac catheterization, however, suggested stable anatomy with known  100% occlusion of the vein graft and continued patency of the LIMA-LAD  graft.  With respect to her previously placed stent in the circumflex  artery, this had approximately 50% tandem in-stent restenosis.  Left  ventricular function was normal.  Dr. Gala Romney recommended continued  medical management.   Clinically, Henry [name] seems to suggest a chronic, pansternal chest  pressure.  He denies any exertional chest discomfort, but does have some  mild exertional dyspnea.  He is currently being treated for possible  community acquired pneumonia.  He does have a significant, nonproductive  cough and denies any fevers.  With respect to tobacco, he stopped  smoking in 1974, but does continue to chew.   CURRENT MEDICATIONS:  1. Full-dose aspirin.  2. Plavix.  3. Lipitor 40 daily.  4. Metoprolol 50 b.i.d.  5. Nifedipine 90 daily.  6. Advair.  7. Singulair.  8. Nasonex.  9. Glimepiride two daily.  10.Zegerid.  11.Fish oil 1000 b.i.d.  12.Isosorbide mononitrate 60 daily.  13.Diovan 160 daily.  14.Factive 320 mg daily (5 days).  15.Prednisone dose pack.  16.Nitrostat p.r.n.   PHYSICAL EXAMINATION:  VITAL SIGNS:  Blood pressure 164/89, pulse 101,  regular 101, regular, pulse ox 97% on room air,  weight 196.  GENERAL:  A 70 year old male, moderately obese, sitting upright, no  distress.  HEENT:  Normocephalic, atraumatic.  NECK:  Palpable carotid pulse without bruits.  No JVD.  LUNGS:  Diffuse expiratory coarse wheezes and rhonchi.  HEART:  Regular rhythm at increased rate.  No significant murmurs.  No  rubs.  ABDOMEN:  Protuberant, nontender.  EXTREMITIES:  Stable right groin with no hematoma, ecchymosis, or bruit  on auscultation.  Intact femoral and distal pulses.  NEURO:  No focal deficit pressure.   IMPRESSION:  1. Multivessel coronary artery disease.      a.     Stable anatomy by recent cardiac catheterization, with known       100% occlusion of both vein grafts; patent LIMA - LAD graft; 50%       tandem in-stent restenosis of left circumflex artery, treated       medically.      b.     Status post NSTEMI/3v CABG, August 2005.      c.     Status post Cypher stenting of CFX, October 2006.  2. Normal LVF.  3. Hypertension, controlled.  4. Type 2 diabetes mellitus.  5. Dyslipidemia.      a.     LDL 48, HDL 42, and total cholesterol 120, February 2009.  6. Ongoing tobacco.  7. ACE inhibitor intolerance (cough).  8. Asthma.  9. Recurrent bronchitis, question pneumonia.   PLAN:  1. Medication adjustments with discontinuation of nifedipine and      initiation of a negative ionotrope with verapamil 120 mg daily.      The patient has documented high basal heart rates, per review of      his chart.  Given his normal left ventricular function, as well as      uncontrolled hypertension, I have elected to add verapamil as a      potentially more effective agent for both of these issues for him,      rather than continuing nifedipine.  Given that he is not reporting      any symptoms suggestive of true exertional angina pectoris, I will      continue the current dose of isosorbide mononitrate.  2. Down titrate aspirin from full dose to 81 mg daily.  The patient      does  complain of easy      bruisability and has been on Plavix for quite some time, which we      will continue.  3. Schedule return clinic followup with myself and Dr. Andee Lineman in the      following 2-3 months.      Rozell Searing, PA-C  Electronically Signed      Jonelle Sidle, MD  Electronically Signed   GS/MedQ  DD: 07/14/2008  DT: 07/15/2008  Job #: [id number]   cc:   Delaney Meigs, M.D.

## 2010-11-27 NOTE — Assessment & Plan Note (Signed)
[facility]                          [facility]   NAME:Henry [name], Henry [name]                       MRN:          [id number]  DATE:06/29/2008                            DOB:          07/02/1941    REFERRING PHYSICIAN:  Delaney Meigs, M.D.   HISTORY OF PRESENT ILLNESS:  The patient is a 70 year old male who was  seen recently in the office on June 02, 2008.  At that time, the  patient complained of a cough with greenish phlegm.  He also had some  wheezing on exam.  He reported left-sided chest pain.  He, however, did  report 2 different types of chest pain, one that was associated with  left-sided pressure and was worsened with cough, but he also had some  substernal chest pain, which are sharper in nature, which was more  intermittent and worsened on exertion.  The patient on exam at that time  was significant wheezing.  I treated him with steroids and antibiotics  and this problem has now resolved.  He also underwent stress testing on  June 13, 2008, which demonstrated an abnormal study.  In particular,  that appear to be a lateral defect which was completely reversible in  the area of his prior placed stent to the circumflex coronary artery.   The patient underwent bypass grafting in 2005 by Dr. Cornelius Moras.  He then  presented in late 2006 with several weeks of exertional angina.  He was  found to have occlusion of both vein graft with continued patency of his  LIMA to his LAD.  He also has at least 80% stenosis of proximal  circumflex compromise with perfusion of both the native circumflex  territory as well as a distal RCA, which was supplied via large  collaterals from the proximal circumflex.  Dr. Samule Ohm proceeded with  placement of a Cypher stent in the ostium of the circumflex artery  extending down to overlap a previously placed stent in the mid  circumflex.  He had a repeat catheterization in November 2006, when he  had wide  patency of both stents.   The patient is now following up after a stress test.  I explained to him  that this study is abnormal, but I am still not entirely convinced that  his chest pain is necessarily anginal in nature.  Although, his  bronchitis and wheezing has resolved, he continues to complain of a left-  sided chest pain, which is worse with certain positions and worse on  palpation.  He also still has a substernal chest pain, which has some  exertional component, but it is not consistently relieved with  nitroglycerin either.  A chest x-ray that was done showed no significant  abnormalities.   MEDICATIONS:  1. Plavix 75 mg p.o. daily.  2. Lipitor 40 mg p.o. daily.  3. Altace 10 mg p.o. daily.  4. Metoprolol 50 mg p.o. b.i.d.  5. Nifedipine 90 mg p.o. daily.  6. Aspirin 325 daily.  7. Advair 250/50 one puff b.i.d.  8. Singulair 10 mg p.o. daily.  9. Nasonex.  10.Glimepiride.  11.Zegerid 40/1100 mg p.o. daily.  12.Fish oil 100 mg p.o. b.i.d.  13.Isosorbide 30 mg p.o. b.i.d.   PHYSICAL EXAMINATION:  VITAL SIGNS:  Blood pressure 160/91, heart rate  100, and weight 195 pounds.  GENERAL:  Well-nourished white male, very anxious, and slightly  diaphoretic.  HEENT:  Pupils isochoric.  Conjunctivae are clear.  NECK:  Supple.  NECK:  Normal carotid upstroke and no carotid bruits.  No thyromegaly.  Non-nodular thyroid.  No cervical adenopathy.  LUNGS:  Not clear, still has slightly prolonged expirium, but no  wheezing.  HEART:  Regular rate and rhythm with normal S1 and S2, and no murmur,  rubs, or gallops.  ABDOMEN:  Soft and nontender.  No rebound or guarding.  Good bowel  sounds.  EXTREMITIES:  No cyanosis, clubbing, or edema.  NEUROLOGIC:  The patient is alert and oriented.  Grossly nonfocal.   PROBLEM LIST:  1. Chest pain with atypical features.      a.     Coronary artery disease, status post coronary artery bypass       grafting in 2005 with occlusion of vein graft  to the circumflex       and right coronary artery.      b.     Status post stent placement x2 in the mid circumflex and the       proximal ostial circumflex in 2006 by Dr. Samule Ohm.      c.     Abnormal Cardiolite study, currently with lateral defects       reversible.  2. Status post recent acute bronchitis, resolved with steroids and      antibiotics.  3. Hypertension, poorly controlled.  4. Tachycardia.  5. History of cough with change of Altace to Diovan.  6. Gastroesophageal reflux disease.  7. Asthma.   PLAN:  1. The patient has very atypical symptoms, and I do think they are      more related to chest wall pain, possibly with a component of      anxiety.  However, his Stress Cardiolite study was abnormal with a      lateral defect and given the fact that he has overlapping stents      with 1 stent involving the ostium of the circumflex coronary      artery.  We will plan to proceed with a cardiac catheterization,      particularly as his right coronary arteries also at risk through      large collaterals from the circumflex distribution.  2. Again, the patient had prescription of Klonopin, because he is      quite anxious for the next couple of days.  3. I have discussed the risks and benefits of proceeding with a      cardiac catheterization and the patient is willing to proceed.  The      patient has no significant findings on catheterization.  He      will need more specific treatment for musculoskeletal pain with      consideration of x-rays of the left hemi-chest with rib details.     Learta Codding, MD,FACC  Electronically Signed    GED/MedQ  DD: 06/29/2008  DT: 06/30/2008  Job #: [id number]   cc:   Delaney Meigs, M.D.

## 2010-11-30 NOTE — H&P (Signed)
Henry [name], Henry [name]                ACCOUNT NO.:  [id number]   MEDICAL RECORD NO.:  [id number]          PATIENT TYPE:  INP   LOCATION:  1843                         FACILITY:  [facility]   PHYSICIAN:  [place] [name], M.D. [facility] OF BIRTH:  [birth year]   DATE OF ADMISSION:  05/02/2005  DATE OF DISCHARGE:                                HISTORY & PHYSICAL   ADDENDUM:  Henry [name] states that his chest heaviness worsens with lying flat on his  back and improves when he sits up.  He also notices worsening of his chest  heaviness with walking up the hill and improvement with resting.   PAST MEDICAL HISTORY:  Coronary artery disease status post coronary artery  bypass grafting in August 2005 with the following grafts placed:  LIMA to  LAD, SVG to circumflex, SVG to PDA.  Henry [name] also has a history of  hypertension, type 2 diabetes, asthma, remote tobacco abuse, and a family  history of coronary artery disease.   CURRENT MEDICATIONS:  Plavix 75 mg daily, Lipitor 40 mg daily, Altace 10 mg  daily, Metoprolol 60 mg b.i.d., aspirin 325 mg daily, Nifedipine 90 mg  daily, Singular 10 mg daily, Amaryl 10 mg daily, Nasonex 50 mcg daily,  Advair 100/50 daily or b.i.d. as needed.   PHYSICAL EXAMINATION:  VITAL SIGNS:  Blood pressure 150/80, pulse 86 and regular.  GENERAL:  Well developed male in no acute distress.  NECK:  No JVD, no carotid bruits.  CARDIOVASCULAR:  He has a regular rate and rhythm with a normal S1 and S2,  no murmurs are appreciated.  LUNGS:  Decreased breath sounds bilaterally, otherwise clear.  ABDOMEN:  Soft, nontender, active bowel sounds.  EXTREMITIES:  No cyanosis, clubbing, or edema are noted, distal pulses are  intact bilaterally.  NEUROLOGICAL:  He is alert and oriented x 3.   IMPRESSION AND PLAN:  1.  Coronary artery disease status post non-Q wave MI with subsequent      coronary artery bypass grafting in August 2005.  The patient now      presents with recurrent  chest discomfort that he describes as consistent      with his previous angina.  He is to be admitted to [facility]      and have some basic laboratory tests including a CBC, CMP, and cardiac      enzymes q.8h. x 3.  He will be continued on his current medications as      they are listed above.  Will obtain a chest x-ray for further      evaluation.  He has had an electrocardiogram done here in the office      that reveals a sinus rhythm at 86 beats per minute with nonspecific ST      abnormalities, poor R wave progression, and improved since his previous      EKG performed in September 2005.  If he rules out for acute myocardial      infarction with serial enzymes, he will have a stress Myoview study      performed tomorrow.  2.  Hypertension, will continue his medications as prior to admission.  3.  Diabetes mellitus.  We will follow his blood glucose levels during      hospitalization.  He will be continued on his Amaryl.  4.  Asthma, will continue on Advair and Singular.  5.  Hyperlipidemia.  Recent fasting lipid profile revealed an LDL      cholesterol of 51, HDL of 38, and triglycerides 118, his total      cholesterol was 113.  Continue on his Lipitor as above.   The patient was interviewed and examined by Dr. Quilcene [name], he agrees  with the above assessment and plan.      Amy Mercy Riding, P.A. [facility]      Henry [name], M.D. [facility]  Electronically Signed    AB/MEDQ  D:  05/02/2005  T:  05/02/2005  Job:  [id number]

## 2010-11-30 NOTE — H&P (Signed)
NAMEZAYDON, KINSER                ACCOUNT NO.:  [id number]   MEDICAL RECORD NO.:  [id number]          PATIENT TYPE:  INP   LOCATION:  1843                         FACILITY:  [facility]   PHYSICIAN:  Bennett Bing, M.D. [facility] OF BIRTH:  February 23, 1941   DATE OF ADMISSION:  05/02/2005  DATE OF DISCHARGE:                                HISTORY & PHYSICAL   HISTORY OF PRESENT ILLNESS:  Mr. Nanney is seen in [facility]  office for Horizon study follow-up visit. Upon evaluation he reported chest  discomfort for the last three to four weeks. He states that he has had a  heaviness in his left anterior chest that occurs with rest and with  exertion. It is intermittent in nature. He states he feels that he has  progressively worsened over the last several days. This morning he was  awakened by severe agonizing chest pain that lasted approximately 15  minutes. He denies any association with shortness of breath. He continues  presently to have heaviness in his left anterior chest. He states that the  chest heaviness does worsen with walking up a hill and is associated with  some dyspnea on exertion at that time. He states that he has been walking  two miles daily since his bypass surgery and only began to have problems  several weeks ago.   Dictation ended at this point.      Amy Mercy Riding, P.A. [facility]      Elmo Bing, M.D. [facility]  Electronically Signed    AB/MEDQ  D:  05/02/2005  T:  05/02/2005  Job:  [id number]

## 2010-11-30 NOTE — Discharge Summary (Signed)
NAMEHOY, FALLERT                ACCOUNT NO.:  [id number]   MEDICAL RECORD NO.:  [id number]          PATIENT TYPE:  INP   LOCATION:  6526                         FACILITY:  [facility]   PHYSICIAN:  Salvadore Farber, M.D. LHCDATE OF BIRTH:  May 05, 1941   DATE OF ADMISSION:  05/02/2005  DATE OF DISCHARGE:  05/08/2005                                 DISCHARGE SUMMARY   PRIMARY CARDIOLOGIST:  Dr. Andee Lineman   PRIMARY CARE PHYSICIAN:  Dr. Lysbeth Galas   PRINCIPAL DIAGNOSIS:  1.  Unstable anginal pain status post CYPHER stent to the circumflex.  2.  Residual coronary artery disease with the left anterior descending      totaled, left internal mammary artery to left anterior descending widely      patent, and right coronary artery totaled with left-to-right      collaterals, 95% posterior descending artery and 60% obtuse marginal      medical therapy.  3.  Status post aortocoronary bypass surgery in 2005 with left internal      mammary artery to left anterior descending, saphenous vein graft to      right coronary artery and saphenous vein graft to obtuse marginal,      saphenous vein graft to obtuse marginal and saphenous vein graft to      right coronary artery totaled at catheterization this admission.  4.  Hypertension.  5.  Non Q-wave myocardial infarction prior to bypass surgery in August of      2005.  6.  Diabetes.  7.  Asthma.  8.  Hyperlipidemia.  9.  Gastroesophageal reflux disease.   ALLERGIES:  No known drug allergies.   PROCEDURES:  1.  Cardiac catheterization.  2.  Left ventriculogram.  3.  Coronary angiography.  4.  Saphenous vein graft angiography x2 and left internal mammary artery      arterial graft angiography.  5.  Percutaneous intervention and stent with a CYPHER stent to the      circumflex.   HOSPITAL COURSE:  Henry [name] is a 70 year old man with known coronary artery  disease.  He was seen in the office for Horizon study follow-up and reported  chest discomfort  described as a heaviness that is associated both with rest  and exertion.  He was evaluated and it was felt that he needed admission to  the hospital with cardiac catheterization.  He was admitted to the hospital  on May 02, 2005.   His cardiac enzymes were negative for MI and he had a stress Myoview.  During the stress portion of the test he had no angina but he had an  anterolateral defect with partial reversibility consistent with inferior  wall ischemia.  It was felt that he needed catheterization and this was  performed on May 06, 2005.   Catheterization showed the SVG to RCA and SVG to OM occluded.  The LIMA to  LAD was widely patent.  There were left-to-right collaterals to the RCA and  a 95% PDA is a small vessel and medical therapy is recommended.  The  circumflex had patent stents, but prior to  and after the previously placed  stent there was an 80% stenosis.  This was treated with PTCA and a CYPHER  stent reducing the stenosis to 10%.  The ostial branch which collateralized  with the RCA is jailed, but had no ostial stenosis and had TIMI 3 flow.   Mr. Yeakle tolerated the procedure well and the sheath was removed without  difficulty.  His CK-MB post procedure was negative.  His troponin I had  slight elevation but he had no chest pain with ambulation.  His vital signs  were stable and his groin had a small hematoma with a small amount of  ecchymosis, but no bruit.  He was evaluated by Dr. Samule Ohm and considered  stable for discharge with outpatient follow-up arranged.   DISCHARGE LABORATORIES:  Hemoglobin 14.1, hematocrit 41.7, WBC 11, platelets  313.  Sodium 133, potassium 3.8, chloride 102, CO2 25, BUN 15, creatinine  1.2, glucose 99.   DISCHARGE INSTRUCTIONS:  He is to stick to a low fat, diabetic diet.  He is  to do no driving for two days and no lifting for two weeks.  He is to follow  up with Dr. Andee Lineman in [place] on November 1 at 2:15 and with Dr. Lysbeth Galas as   well.   DISCHARGE MEDICATIONS:  1.  Plavix 75 mg daily.  2.  Lipitor 40 mg q.h.s.  3.  Altace 10 mg daily.  4.  Metoprolol 50 mg b.i.d.  5.  Aspirin 325 mg daily.  6.  Nifedipine 90 mg daily.  7.  Singulair 10 mg daily.  8.  Amaryl 2 mg daily.  9.  Nasonex 50 mcg daily.  10. Advair 100/50 daily.  11. Protonix 40 mg daily.  12. Prilosec 40 mg daily.  13. Nitroglycerin sublingual p.r.n.   DURATION OF DISCHARGE ENCOUNTER:  42 minutes.      Theodore Demark, P.A. [facility]      Salvadore Farber, M.D. [facility]  Electronically Signed   RB/MEDQ  D:  05/08/2005  T:  05/08/2005  Job:  [id number]   cc:   [facility] [place] R[place], M.D.  Fax: [phone]

## 2010-11-30 NOTE — Assessment & Plan Note (Signed)
[facility]                            [facility] OFFICE NOTE   BENJERMAN, MOLINELLI                         MRN:          [id number]  DATE:05/14/2006                            DOB:          June 08, 1941    [name] is seen for cardiology follow-up.  He is also seen today to help  for cardiac clearance for a neurosurgical procedure for back pain.  The  patient has significant cardiac disease.  Fortunately, most recently he has  been stable.  The patient underwent CABG in the past.  Then in October of  2006he was assessed for chest discomfort.  His cath at that time showed  normal left ventricular function.  The study showed occlusion of both vein  grafts.  The LIMA was patent.  There was severe stenosis of the proximal  circumflex and decision was made to proceed with an intervention with a drug-  eluting stent to the proximal circumflex.  The patient did quite well with  this.   Then in November of 2006, the patient had some recurring symptoms.  He was  brought back in and catheterization was done.  Studies showed that the  circumflex stent was widely patent.  The anatomy had not changed and the  patient was stable.   Mr.. Kneale is not having any recurring chest pain.  He does have some chest  wall pain.  Other than this, he is stable.   PAST MEDICAL HISTORY:  1. Allergies:  No known drug allergies.  2. Medications:  Lipitor 40, Plavix 75, metoprolol 50 b.i.d., nifedipine      10, Singulair 10, Nasonex 50, Advair 10/50 b.i.d., aspirin 325, Altace      10, omeprazole 40, Glimepiride 2 mg.  3. Other medical problems:  See the complete list below.   REVIEW OF SYSTEMS:  He is not having any significant GI or GU symptoms.  He  does have some chest wall discomfort.  Otherwise his review of systems is  negative.   PHYSICAL EXAMINATION:  The patient is oriented to person, time and place.  Affect is normal.  Blood pressure today 140/78 with a  pulse of 93.  HEENT  reveals that he has no xanthelasma.  There is normal extraocular motion.  There are no carotid bruits.  There is no jugular venous distention.  Lungs  are clear.  Respiratory effort is not labored.  Cardiac exam reveals an S1  with an S2.  There are no clicks or significant murmurs.  His abdomen is  soft.  There are no masses or bruits.  He has no peripheral edema.  He has  good distal pulses.   EKG shows no significant change with poor anterior R-wave progression.   PROBLEM LIST:  1. Gastroesophageal reflux disease.  2. Diabetes.  3. Asthma.  4. Hyperlipidemia on medications.  5. Hypertension treated.  6. Coronary artery disease.  The patient is post coronary artery bypass      graft in the past.  His left internal mammary artery is patent.  His      vein  grafts are occluded.  He had a circumflex stent placed in October      of 2006 and recath in November of 2006 showed that that area was nicely      patent.  He is not having any significant symptoms.  7. Normal left ventricular function when assessed by his last cath.  8. Plavix therapy.  The patient is now one year post the placement of his      drug-eluting stent.  9. Need for neurosurgery for very limiting back pain.   Based on the data that we have and his overall lack of cardiac symptoms, I  believe he can be cleared for his neurosurgery.  I am sure that his Plavix  will have to be stopped for this.  With this in mind, I have chosen to stop  his Plavix at this time to be sure that he remains stable.  I prefer this  approach to the approach of stopping Plavix just prior to his significant  procedure.  If he were to have any difficulties, he can be in touch with [place].   We will stop his Plavix today.  We will see him for early follow up to be  sure he is stable. In the meantime, we can allow Dr. Lovell Sheehan to schedule his  back procedure in the range of several weeks from now.     ______________________________  Luis Abed, MD, [facility]    JDK/MedQ  DD: 05/14/2006  DT: 05/14/2006  Job #: [id number]   cc:   Delaney Meigs, M.D.  Cristi Loron, M.D.

## 2010-11-30 NOTE — Cardiovascular Report (Signed)
Henry [name], Henry [name]                            ACCOUNT NO.:  [id number]   MEDICAL RECORD NO.:  [id number]                   PATIENT TYPE:  INP   LOCATION:  1832                                 FACILITY:  [facility]   PHYSICIAN:  Carole Binning, M.D. [facility]         DATE OF BIRTH:  10/30/1940   DATE OF PROCEDURE:  03/06/2004  DATE OF DISCHARGE:                              CARDIAC CATHETERIZATION   PROCEDURES:  1. Left heart catheterization with coronary angiography.  2. Left ventriculography.  3. Abdominal aortography.   INDICATIONS:  Mr. Szabo is a 70 year old male with a long history of  exertional angina.  He presented to Dr. Joyce Copa office this morning with  the onset of chest pain at approximately 7:30 a.m.  EKG's obtained in Dr.  Joyce Copa office revealed ST segment elevation and anterior leads.  EMS was  summonsed.  The patient was brought emergently to [facility] ER.  In the  emergency room here, his pain had resolved and ST segment elevation had  resolved.  There was residual ST segment depression in the anterolateral  leads.  He was enrolled in the horizon's trial and randomized treatment of  AngioMax.  He was brought urgently to the Cardiac Catheterization  Laboratory.   DESCRIPTION OF PROCEDURE:  A 6-French sheath was placed in the right femoral  artery.  Coronary angiography was performed with standard Judkins 6-French  catheters.  Left subclavian arteriography was performed with the JL-4  catheter.  Left ventriculography and abdominal aortography performed with an  angled pigtail catheter.  Contrast was Omnipaque.  There were no  complications.   HEMODYNAMIC DATA:  Left ventricular pressure 116/22.  Initial aortic  pressure was 100/66.  There was no aortic valve gradient.   LEFT VENTRICULOGRAM:  There was moderate akinesis of the apical wall.  Ejection fraction was estimated at 50%.  There was no mitral regurgitation.   ABDOMINAL AORTOGRAM:  Abdominal aortogram  reveals normal abdominal aorta,  renal arteries and iliac arteries.   Left subclavian artery and left internal mammary artery were patent.   CORONARY ARTERIOGRAPHY (RIGHT DOMINANT):  Of note:  All three coronary  arteries were heavily calcified throughout.   Left main has luminal irregularities.   Left anterior descending artery has very diffuse calcification with diffuse  50% stenosis in the proximal vessel.  In the mid vessel there is diffuse 70%  followed by a 90% stenosis.  The distal LAD is 100% occluded with TIMI-0  flow beyond this.  There are collaterals filling the distal and apical LAD.  Prior to it's occlusion, the LAD gives rise to 4 small diagonal branches.  The fourth diagonal branch has a 95% stenosis proximally.   Left circumflex is very heavily calcified.  There is a 70% stenosis in the  ostium of the circumflex.  Circumflex gives rise to a large first obtuse  marginal branch with a long 70-80% stenosis.  There is a  normal sized  bifurcating second obtuse marginal which has a diffuse 80% stenosis  proximally extending into both branches of the bifurcation.  The circumflex  is supplying collaterals to the distal right coronary artery.   Right coronary artery has a long 50% stenosis proximally followed by 10%  occlusion of the proximal vessel.  There are right to left collaterals  filling the right coronary artery including a normal sized posterior  descending artery and a small posterolateral branch.   IMPRESSION:  1. Mildly decreased left ventricular systolic function with apical akinesis.  2. Severe three vessel coronary artery disease with heavy calcified and     diffuse disease of the coronary arteries.  The culprit for the patient's     acute myocardial infarction appears to be the occlusion of the distal     left anterior descending.  However, at this point, the patient is pain     free, and there are collaterals filling the distal and apical left      anterior descending.  Furthermore, there is severe diffuse disease     proximal to this occlusion in the distal left anterior descending.  Based     on this, the patient will best be served by coronary artery bypass     surgery.   PLAN:  The patient will be resumed on AngioMax per study protocol after  sheath is removed.  Cardiovascular Surgery will be consulted for coronary  artery bypass surgery.                                               Carole Binning, M.D. [facility]    MWP/MEDQ  D:  03/06/2004  T:  03/07/2004  Job:  [id number]   cc:   Delaney Meigs, M.D.  [address]  [place]  [place] [address]  Fax: [phone]   Learta Codding, M.D. [facility]   Cardiac Catheterization Laboratory

## 2010-11-30 NOTE — Op Note (Signed)
Henry [name], Henry [name]                ACCOUNT NO.:  [id number]   MEDICAL RECORD NO.:  [id number]          PATIENT TYPE:  INP   LOCATION:  2899                         FACILITY:  [facility]   PHYSICIAN:  Cristi Loron, M.D.DATE OF BIRTH:  04-24-1941   DATE OF PROCEDURE:  06/19/2006  DATE OF DISCHARGE:                               OPERATIVE REPORT   BRIEF HISTORY:  The patient is a 70 year old white male who has suffered  from back and leg pain.  He failed medical management and was worked up  with a lumbar MRI which demonstrated spinal stenosis at L4-5 and 5-1.  He wound up having an epidural injections, which did not help much.  The  patient therefore weighed the risks, benefits and alternatives to  surgery and decided to proceed with a decompressive laminectomy.   PREOPERATIVE DIAGNOSIS:  L4-5 and 5-1 spinal stenosis, lumbago and  lumbar radiculopathy.   POSTOPERATIVE DIAGNOSIS:  L4-5 and 5-1 spinal stenosis, lumbago and  lumbar radiculopathy.   PROCEDURE:  L4 and L5 laminectomy and bilateral foraminotomies using  microdissection.   SURGEON:  Delma Officer, M.D.   ASSISTANT:  Colon Branch, M.D.   ANESTHESIA:  General endotracheal.   ESTIMATED BLOOD LOSS:  100 mL.   SPECIMENS:  None.   DRAINS:  None.   COMPLICATIONS:  None.   DESCRIPTION OF PROCEDURE:  The patient was brought to the operating room  by the anesthesia team.  General endotracheal anesthesia was induced.  The patient was then turned in the prone position onto the [place] frame.  His lumbosacral region was then shaved with the clippers and prepared  with Betadine scrub and Betadine solution.  Sterile drapes were applied.  I injected the area to be incised with Marcaine with epinephrine  solution and used the scalpel to make a linear incision over the L4-5  and 5-1 interspaces.  I used the electrocautery to perform a bilateral  subperiosteal dissection, exposing the spinous process and lamina of L3,  4 and 5.   I obtained intraoperative radiograph to confirm our location  and then we inserted the [facility] retractor for exposure.  We began by  incising the L3-4, 4-5 and 5-1 interspinous ligaments with a scalpel and  used the Leksell rongeur to remove the spinous process of L5 and L4.  We  then used a high-speed drill to perform bilateral L4 and L5  laminotomies.  We then used the Kerrison punch to complete the L4 and L5  laminectomy and remove the ligamentum flavum at L3-4, 4-5 and 5-1.  We  performed foraminotomies about the bilateral L5 and S1 nerve root as  well as the L4 nerve root.  There was considerable stenosis at L4-5 and  to a lesser extent at L5-S1.  I then inspected the intervertebral disks;  they were bulging, but there were no significant herniations  bilaterally.  On the dorsal aspect of the dura, there were several, what  appeared to be needle holes from the patient's prior epidural injection;  one of them was leaking spinal fluid.  I placed a figure-of-eight 6-0  Prolene in  there and then had Anesthesia perform Valsalva on the patient  and we did not see any further spinal fluid leak, but because there were  several, what appeared to be needle holes from the epidurals, I placed  Tisseel over the exposed dura.  I should mention that I placed the 6-0  Prolene suture under magnification and illumination of the microscope.  We then removed the McCullough retractor and then reapproximated the  patient's thoracolumbar fascia with interrupted #1 Vicryl suture, the  subcutaneous tissue with interrupted 2-0 Vicryl suture and the skin with  Steri-Strips and Benzoin.  The wound was then coated with Bacitracin  ointment, a sterile dressing was applied, the drapes were removed and  the patient was subsequently returned to the supine position, where he  was extubated by the anesthesia team and transported to the  postanesthesia care unit in stable condition.  All sponge, instrument  and  needle counts were correct at the end of this case.      Cristi Loron, M.D.  Electronically Signed     JDJ/MEDQ  D:  06/19/2006  T:  06/20/2006  Job:  [id number]

## 2010-11-30 NOTE — Discharge Summary (Signed)
Henry [name], Henry [name]                ACCOUNT NO.:  [id number]   MEDICAL RECORD NO.:  [id number]          PATIENT TYPE:  INP   LOCATION:  2038                         FACILITY:  [facility]   PHYSICIAN:  Salvatore Decent. Cornelius Moras, M.D. DATE OF BIRTH:  10/30/1940   DATE OF ADMISSION:  03/06/2004  DATE OF DISCHARGE:  03/19/2004                                 DISCHARGE SUMMARY   ADMITTING DIAGNOSIS:  Chest pain.   PAST MEDICAL HISTORY AND DISCHARGE DIAGNOSES:  1.  Severe three-vessel coronary artery disease with Class IV unstable      angina, status post acute non-Q-wave myocardial infarction, status post      coronary artery bypass grafting x3.  2.  Hypertension.  3.  Type 2 diabetes mellitus.  4.  Nasal polyps.  5.  Chronic sinusitis.  6.  Asthma.  7.  History of tobacco abuse.   ALLERGIES:  No known drug allergies.   BRIEF HISTORY:  The patient is a 70 year old white male who had no previous  history of coronary artery disease, but his past medical history was  significant for hypertension, type 2 diabetes and strong family history of  coronary artery disease.  The patient describes a 2- to 31-month history of  progressive symptoms of classic exertional angina.  He describes substernal  chest tightness that was brought on by activity and relieved with rest.  This had increased in frequency and severity over the past several months  prior to admission.  On the morning of March 06, 2004, he was awakened from  sleep with similar severe substernal chest tightness which was now occurring  at rest.  This was associated with mild shortness of breath and nausea.  The  patient presented to Dr. Ralene Ok Nyland's office and was then taken by  EMS to the emergency room at [facility], where he was evaluated by Dr. Learta Codding.  Baseline echocardiogram was notable for Q waves in the  anteroseptal leads and diffuse nonspecific ST changes consistent with acute  coronary ischemia.  The initial set of  cardiac enzymes were positive.  The  patient's symptoms resolved with nitrates.  Dr. Andee Lineman then sent the patient  for urgent cardiac cath; this was performed by Dr. Emilie Rutter. Pulsipher on  March 06, 2004 and findings revealed severe three-vessel coronary artery  disease with only mild left ventricular dysfunction.   HOSPITAL COURSE:  The patient was admitted via the emergency department by  Dr. Andee Lineman and taken for urgent cardiac cath by Dr. Gerri Spore, as previously  stated.  Cardiac cath revealed three-vessel coronary artery disease and  therefore Dr. Salvatore Decent. Cornelius Moras of CVTS was consulted regarding surgical  revascularization.  Dr. Cornelius Moras evaluated the patient on March 06, 2004 and it  was his opinion that the patient should undergo coronary artery bypass  grafting.  The patient was admitted and maintained on routine care with IV  nitroglycerin and heparin prior to surgery.  The patient remained in stable  condition, however, on hospital day 1, the patient had the new development  of gross hematuria while on heparin;  therefore, Dr. Claudette Laws of  urology services was consulted.  It was his opinion that the patient was in  stable condition and could proceed with coronary artery bypass grafting.  The GU exam was normal and the prostate felt benign. Dr. Etta Grandchild agreed to  follow up with the patient postoperatively.   The patient was taken to the OR on March 14, 2004 for coronary artery  bypass grafting x3.  The left internal mammary artery was grafted to the  LAD, saphenous vein was grafted to the PD, and saphenous vein was grafted to  the circumflex.  EVH was performed on the bilateral thighs.  A coronary  endarterectomy was also performed on the LAD.  The patient tolerated the  procedure well and was hemodynamically stable immediately postoperatively.  The patient was transferred from the OR to the SICU in stable condition.  The patient was extubated without complication and woke up  from anesthesia  neurologically intact.  No inotropic agents were used during the procedure.   On postoperative day 1, the patient felt well and complained of only mild  chest soreness.  He was afebrile with normal vital signs and maintaining a  normal sinus rhythm.  Chest tube output was low and they were therefore  discontinued in routine fashion.  The patient was doing well and he was  mobilized.  The patient was transferred to unit 2000 without complication.   The patient began cardiac rehab on postoperative day 1 and tolerated this  relatively well.  Cardiac rehab continued postoperatively and his tolerance  level increased, as expected.   On postoperative day 2, the patient's urine culture had been sent  preoperatively and this came back with probable contamination.  His  preoperative UA was positive for bacteria and on postoperative day 2, the  patient was noted to have an elevated white count.  The patient was started  on Bactrim empirically and the urine culture was re-sent.  The patient was  diuresed postoperatively for volume overload.  Also on postoperative day 2,  the patient was noted to have an elevated creatinine and therefore this was  monitored closely.   The remainder of the patient's postoperative course progressed as expected.  On postoperative day 5, the patient complained of mild nausea with no  vomiting.  He was afebrile with stable vital signs and he was maintaining  sinus rhythm.  The patient was diuresing well.  On physical exam, cardiac  was regular rate and rhythm.  Lungs revealed decreased bowel sounds in the  right base, otherwise, clear.  The abdomen was soft with active bowel  sounds.  The incisions were healing well and there was trace edema present  in bilateral lower extremities.  The patient's white count was trending down  and the diabetes mellitus was under good control.  The patient was ambulating well and was in stable condition, and felt to be  ready for  discharge home.   LABORATORIES:  CBC and BMP on March 19, 2004:  White count 14.5,  hemoglobin 12.1, hematocrit 35, platelets 355,000.  Sodium 136, potassium  4.1, BUN 15, creatinine 1.3, glucose 99; AST 54, ALT 36, alkaline  phosphatase 54, total bilirubin 0.8.   CONDITION ON DISCHARGE:  Condition on discharge is improved.   INSTRUCTIONS:  1.  Medications:      1.  Aspirin 325 mg p.o. daily.      2.  Lipitor 40 mg p.o. daily.      3.  Singulair  10 mg daily.      4.  Amaryl 10 mg daily.      5.  Advair.      6.  Prilosec.      7.  Plavix 75 mg daily.      8.  Lasix 40 mg daily x1 week.          1.  K-Dur 10 mEq daily x1 week.      9.  Lopressor 50 mg p.o. b.i.d.      10. Altace 2.5 mg p.o. b.i.d.      11. Tylox 1-2 p.o. q.4-6 hours.  2.  Activity:  No driving or lifting more than 10 pounds and the patient is      to continue daily breathing and walking exercises.  3.  Diet:  Low-salt, low-fat and carbohydrate-modified, medium-calorie.  4.  Wound care:  The patient may shower daily and clean incisions with soap      and water.  If wound problems arise, the patient is to contact the CVTS      office.   FOLLOWUP APPOINTMENT:  1.  Followup appointment with Dr. Andee Lineman 2 weeks after discharge; the      patient was to call for an appointment.  The patient was to have a chest      x-ray taken by his cardiologist.  2.  Dr. Cornelius Moras, 3 weeks after discharge and the CVTS office will contact the      patient with that date and time.  The patient was instructed to bring      his chest x-ray from Dr. Margarita Mail office to the appointment with Dr.      Cornelius Moras.       AY/MEDQ  D:  04/19/2004  T:  04/19/2004  Job:  [id number]   cc:   Learta Codding, M.D. [facility]

## 2010-11-30 NOTE — Assessment & Plan Note (Signed)
[facility]                            [facility] NOTE   Henry [name], Henry [name]                         MRN:          [id number]  DATE:05/26/2006                            DOB:          [birth year]    Henry [name] is seen today for followup. See my complete note of May 14, 2006. During that note, I cleared the patient for his neurosurgical  procedure. I also decided at that time to stop his Plavix. I wanted to be  sure that he was stable off of it. I had encouraged the patient to be in  touch with Dr. Lovell Sheehan so that his surgery could be planned and we could  proceed. The patient tells me today that he did not completely understand  and has not been in touch with Dr. Lovell Sheehan. We will arrange for the followup  with Dr. Lovell Sheehan today.   Henry [name] is doing fine off Plavix. He had some vague left chest pain but  this is chronic. I feel comfortable that this does not represent any  problems related to coming off his Plavix.   PAST MEDICAL HISTORY:   ALLERGIES:  No known drug allergies.   MEDICATIONS:  Lipitor, metoprolol, nifedipine, Singulair, Nasonex, Advair,  aspirin, Omeprazole and glimepiride.   OTHER MEDICAL PROBLEMS:  See the complete list on the note of May 14, 2006.   REVIEW OF SYSTEMS:  He is feeling well and his review of systems is  negative.   PHYSICAL EXAMINATION:  GENERAL:  The patient is oriented to person, time and  place. He is anxious about coming off his Plavix but he and I discussed this  at great length and he is very comfortable with it at this time and is  comfortable staying off it until after his neurosurgery when he and I can  discuss it again.  VITAL SIGNS:  Blood pressure is 118/72 with a pulse of 84. Respirations are  18. Weight is 202 pounds. The patient is well-developed and well-nourished  and is somewhat overweight.  HEENT:  Reveals no xanthelasma. He has normal extraocular motion. There are  no carotid bruits. There is no jugular venous distention.  CARDIAC:  Reveals an S1 with an S2. There are no clicks or significant  murmurs.  LUNGS:  Clear. Respiratory effort is not labored.  ABDOMEN:  Soft. There are no masses or bruits.  EXTREMITIES:  He has no peripheral edema.   No labs are done today.   Problems are listed extensively on my note of May 14, 2006 and this not  has already been sent to Dr. Lovell Sheehan and Dr. Lysbeth Galas.   1. GERD.  2. Diabetes.  3. Asthma.  4. Hyperlipidemia.  5. Hypertension treated.  6. Coronary artery disease. The patient is post CABG and post drug-eluting      stent. His procedure was done in October 2006 and he had a recath in      November of 2006 showing that all areas were nicely patent. He is      stable.  7. Normal left ventricular  function.  8. Plavix therapy. The patient is off Plavix. He can have his neurosurgery      at any time and then I will see him back and we will decide if we will      restart his Plavix.  9. Need for neurosurgery for limiting back pain.     Luis Abed, MD, [facility]  Electronically Signed    JDK/MedQ  DD: 05/26/2006  DT: 05/26/2006  Job #: [id number]   cc:   Cristi Loron, M.D.  Delaney Meigs, M.D.

## 2010-11-30 NOTE — Assessment & Plan Note (Signed)
[facility]                          [facility] NOTE   Henry [name], Henry [name]                       MRN:          [id number]  DATE:08/29/2006                            DOB:          Sep 30, 1940    REFERRING PHYSICIAN:  Delaney Meigs, M.D.   HISTORY OF PRESENT ILLNESS:  The patient is a 70 year old male status  post coronary artery bypass grafting in 2005.  The patient underwent  subsequent stenting of the native circumflex coronary artery after  occluding his vein graft to the obtuse marginal right PDA.  The LIMA had  previously remained patent.  The patient was recently hospitalized for  substernal chest pain and referred for cardiac catheterization.  He had  no change in his anatomy.  The LIMA to the LAD remains patent and the  stent portion of circumflex remains widely patent.  The ejection  fraction was also normal.   MEDICATIONS:  1. Lipitor 40 mg p.o. q.h.s.  2. Plavix 75 mg a day.  3. Metoprolol 50 mg p.o. b.i.d.  4. Nifedipine 90 mg p.o. daily.  5. Singulair 10 mg a day.  6. Nasonex 50 daily.  7. Aspirin 325 daily.  8. Glimepiride 2 mg p.o. daily.  9. Advair 250/50 mg p.o. daily.  10.Zegerid 40 mg p.o. daily.   PHYSICAL EXAMINATION:  VITAL SIGNS:  Blood pressure is 149/91, heart  rate is 96.  GENERAL:  Well-nourished white male in no apparent distress.  HEENT:  Pupils isocoric, conjunctivae clear.  NECK:  Supple.  Normal carotid upstroke, no carotid bruits.  LUNGS:  Clear breath sounds bilaterally.  HEART:  Regular rate and rhythm, normal S1 and S2.  No murmur, rubs or  gallops.  ABDOMEN:  Soft.  EXTREMITIES:  No cyanosis, clubbing or edema.   PROBLEM LIST:  1. Coronary artery disease.      a.     Patent left internal mammary artery to the left anterior       descending artery.      b.     Patent stent to native circumflex coronary artery.      c.     Normal left ventricular function.  2. Abnormal Cardiolite  stress study with lateral ischemia.  3. Gastroesophageal reflux disease.  4. Diabetes.  5. Asthma.  6. Dyslipidemia.   PLAN:  1. The patient can continue on his current medical therapy.  I did add      Imdur 30 mg a day.  I do suspect he may have a mild degree of      ischemia in the lateral wall.  2. The patient can follow up with [place] in the next few months.     Learta Codding, MD,FACC  Electronically Signed    GED/MedQ  DD: 08/29/2006  DT: 08/29/2006  Job #: [id number]   cc:   Delaney Meigs, M.D.

## 2010-11-30 NOTE — Cardiovascular Report (Signed)
NAMETOWNES, FUHS                ACCOUNT NO.:  [id number]   MEDICAL RECORD NO.:  [id number]          PATIENT TYPE:  INP   LOCATION:  2031                         FACILITY:  [facility]   PHYSICIAN:  Salvadore Farber, M.D. LHCDATE OF BIRTH:  Apr 08, 1941   DATE OF PROCEDURE:  05/06/2005  DATE OF DISCHARGE:                              CARDIAC CATHETERIZATION   DATE OF SERVICE:  May 06, 2005.   PROCEDURE:  Left heart catheterization, left ventriculography, coronary  angiography, saphenous vein graft and left internal mammary artery graft  angiography.   INDICATION:  Mr. Arch is a 70 year old gentleman status post coronary  artery bypass grafting in August 2005.  He now presents with three weeks of  chest discomfort occurring with minimal exertion.  He has had no rest pain.  He was admitted to the hospital on October 19 and ruled out for myocardial  infarction.  He was then referred for diagnostic angiography today.   PROCEDURAL TECHNIQUE:  Informed consent was obtained.  Under 1% lidocaine  local anesthesia, a #5 French sheath was placed in right common femoral  artery using the modified Seldinger technique.  Diagnostic angiography was  performed using JL4 and JR4 catheters for the native coronaries, JR4 for  each of the two saphenous vein grafts, JR4 for the left subclavian, and LIMA  catheter for the left internal mammary artery graft.  Left heart  catheterization and ventriculography were then performed using a pigtail  catheter.  The patient tolerated the procedure well and was transferred to  the holding room in stable condition.   COMPLICATIONS:  None.   FINDINGS:  1.  LV:  132/11/18.  EF 65% without regional wall motion abnormality.  2.  No aortic stenosis or mitral regurgitation.  3.  Left main:  Angiographically normal.  4.  LAD:  Moderate sized vessel giving rise to no significant diagonals.      The vessel is occluded in its mid section.  The LIMA to LAD is widely     patent with excellent distal runoff.  There is significant disease of      the distal vessel as it approaches the apex.  5.  Circumflex:  Moderate sized vessel giving rise to two obtuse marginals.      The proximal vessel has an 80% stenosis.  There is a patent stent in the      mid vessel.  The first marginal has a 50% stenosis in its mid section.      The second marginal has a long 80% stenosis from its origin.  6.  RCA:  RCA has occluded proximally.  It receives robust collaterals via      atrial branch of the circumflex which arises distal to the lesion of the      proximal circumflex.  This reaches the PLV and then supplies the      remainder of the right in a retrograde fashion.  There is a 95% stenosis      of the proximal PDA.  7.  Saphenous vein graft to the obtuse marginal is occluded.  8.  Saphenous  vein graft to the RCA is occluded.  9.  The left subclavian is angiographically normal.   IMPRESSION AND PLAN:  Occlusion of both saphenous vein grafts.  The left  internal mammary artery is patent and left ventricular systolic function is  preserved.  There is a severe stenosis of the proximal circumflex which  limits flow both to the distal circumflex into the right coronary artery  territory.  We will plan percutaneous revascularization of this.      Salvadore Farber, M.D. [facility]  Electronically Signed     WED/MEDQ  D:  05/06/2005  T:  05/07/2005  Job:  [id number]   cc:   [place] Bing, M.D. [facility]  [address]  [address]  [place]  [place] [address]   Delaney Meigs, M.D.  Fax: [phone]

## 2010-11-30 NOTE — H&P (Signed)
Henry [name], Henry [name] NO.:  [id number]   MEDICAL RECORD NO.:  [id number]          PATIENT TYPE:  INP   LOCATION:  3736                         FACILITY:  [facility]   PHYSICIAN:  Cuthbert Bing, M.D. [facility] OF BIRTH:  1941-04-04   DATE OF ADMISSION:  05/19/2005  DATE OF DISCHARGE:                                HISTORY & PHYSICAL   REFERRING:  [facility] emergency department.   PRIMARY CARE PHYSICIAN:  Delaney Meigs, M.D.   PRIMARY CARDIOLOGIST:  Learta Codding, M.D. [facility]   HISTORY OF PRESENT ILLNESS:  A 70 year old gentleman with coronary artery  disease and recent stent placement in the circumflex presents with recurrent  ischemic chest pain. Mr. Deavers cardiac history dates to 25 when he  underwent uncomplicated CABG surgery. He initially presented with a non-Q  myocardial infarction. He has multiple cardiovascular risk factors including  hypertension, diabetes and a positive family history. He returned recently  with exertional chest discomfort and an abnormal stress test prompting the  repeat coronary angiography which revealed severe three-vessel disease, a  patent LIMA graft to the LAD, total obstruction of saphenous vein grafts to  the obtuse marginal and to the right coronary, and a critical circumflex  lesion adjacent to a prior intracoronary stent. A drug-eluting stent was  placed after this lesion was successfully dilated. Mr. Deady had some mild  chest heaviness in the days following that procedure, but subsequently felt  well. Early the morning of admission he awoke with moderately severe left  chest pressure and aching without associated symptoms and without radiation.  He took sublingual nitroglycerin without much improvement. He took another  sublingual nitroglycerin a few hours later. When this was ineffective, EMS  was summoned. The patient was transported to be [facility] emergency  department where additional oral and  intravenous nitroglycerin plus  intravenous metoprolol was administered, ultimately with improvement in his  chest discomfort. Initial cardiac markers and EKG showed no significant  abnormality.   PAST MEDICAL HISTORY:  Is otherwise notable for asthma, hyperlipidemia and  GERD. The patient has a remote history of cigarette smoking.   ALLERGIES:  He has no known drug allergies.   DISCHARGE MEDICATIONS:  1.  Plavix 75 milligrams q.d.  2.  Atorvastatin 40 milligrams q.d.  3.  Ramipril 10 milligrams q.d.  4.  Metoprolol 50 milligrams b.i.d.  5.  Aspirin 325 milligrams q.d.  6.  Nifedipine 90 milligrams q.d.  7.  Singulair 10 milligrams q.d.  8.  Amaryl 2 milligrams q.d.  9.  Nasonex.  10. Advair.  11. Protonix 40 milligrams q.d.  12. Sublingual nitroglycerin p.r.n.   FAMILY HISTORY:  The patient's father suffered a fatal myocardial infarction  at age 4.   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 Discontinued cigarette smoking in 1974. No history of  excessive alcohol use.   REVIEW OF SYSTEMS:  Notable for stable weight and appetite, chronic sinus  drainage, intermittent wheezing, GERD symptoms.  All other systems reviewed  and are negative.   PHYSICAL EXAMINATION:  GENERAL:  Pleasant well-appearing gentleman in no  acute distress.  VITAL SIGNS:  The blood pressure is 110/80, heart rate 75 and regular,  respirations 20, temperature 98. Afebrile.  HEENT: Anicteric sclerae; pupils equal, round, react to light; normal lids  and conjunctivae.  NECK:  No jugular venous distension; normal carotid upstrokes without  bruits.  ENDOCRINE: No thyromegaly.  HEMATOPOIETIC: No adenopathy.  SKIN:  No significant lesions.  LUNGS:  Clear to auscultation; resonant to percussion.  HEART:  PMI slightly displaced laterally; normal first and second heart  sounds;  fourth heart sound present.  ABDOMEN:  Soft and nontender; no bruits; no masses; no organomegaly.  EXTREMITIES:  Distal pulses 1-2+; no edema.  MUSCULOSKELETAL:  No joint deformities.  NEUROMUSCULAR: Symmetric strength and tone; normal cranial nerves.  PSYCHIATRIC: Normal affect; normal orientation.   EKG:  Normal sinus rhythm; possible prior inferior myocardial infarction;  low voltage; delayed R-wave progression - cannot exclude prior anteroseptal  myocardial infarction.   LABORATORY DATA:  Other laboratory studies notable for negative cardiac  markers and normal renal function. Glucose is 164.   IMPRESSION:  Mr. Tejada returns with chest discomfort that is worrisome for  ischemia despite no acute EKG abnormalities and negative cardiac markers. In  the setting of known severe three-vessel disease with multiple occluded  grafts and a recent intervention, coronary angiography will be necessary to  determine if there is an anatomic basis for his symptomatology. He is taking  moderate doses of nifedipine - coronary spasm appears unlikely, but long-  acting nitrates could be added to his regime if there is no significant  focal disease amenable to intervention. He he may be a candidate for  Ranolazine as well if chest discomfort continues to occur. Serial cardiac  markers and EKGs will be obtained. His usual medications will be continued.  Low dose Lovenox and intravenous nitroglycerin will also be administered  pending coronary angiography on the second hospital day.      Elton Bing, M.D. [facility]  Electronically Signed     RR/MEDQ  D:  05/19/2005  T:  05/20/2005  Job:  [id number]

## 2010-11-30 NOTE — Cardiovascular Report (Signed)
[name], [name]                ACCOUNT NO.:  [id number]   MEDICAL RECORD NO.:  [id number]          PATIENT TYPE:  INP   LOCATION:  2899                         FACILITY:  [facility]   PHYSICIAN:  Salvadore Farber, MD  DATE OF BIRTH:  1941/05/18   DATE OF PROCEDURE:  08/20/2006  DATE OF DISCHARGE:  08/20/2006                            CARDIAC CATHETERIZATION   PROCEDURE:  1. Left heart catheterization.  2. Left ventriculography.  3. Coronary angiography.  4. Left internal mammary angiography.  5. StarClose closure of the right common femoral arteriotomy site.   INDICATIONS:  Henry [name] is a 70 year old gentleman who is status post  coronary artery bypass grafting in 2005.  He subsequently has undergone  stenting of the native circumflex after occluding his vein grafts to the  obtuse marginal and right PDA.  The LIMA has previously remained patent.  In addition, he has had episodes of noncardiac chest pain.  He now  presents with chest discomfort reminiscent of prior angina.  He ruled  out for myocardial infarction.  Cardiolite studies suggested lateral  ischemia and he was referred for diagnostic angiography.   PROCEDURAL TECHNIQUE:  Informed consent was obtained.  Under 1%  lidocaine local anesthesia, a 6-French sheath was placed in the right  common femoral artery using the modified Seldinger technique.  Diagnostic angiography was performed using JL-4 and JR-4 catheters for  the native coronaries, LIMA catheter for the left internal mammary  artery.  The saphenous vein bypass grafts were not imaged, as they had  previously been documented to be occluded.  Left heart catheterization  and ventriculography were then performed using a pigtail catheter.  The  patient tolerated the procedure well and was transferred to the holding  room in stable condition.   COMPLICATIONS:  None.   FINDINGS:  1. LV:  125/6/13.  EF 70% without regional wall motion abnormality.  2. No aortic  stenosis or mitral regurgitation.  3. Left main:  Angiographically normal.  4. LAD:  The vessel was occluded proximally after the origin of 2      diagonals.  The LIMA to LAD is widely patent with excellent distal      runoff.  5. Circumflex:  Fairly large vessel giving rise to 2 obtuse marginals.      The ostium of the vessel has a 20% to 30% stenosis.  The proximal      and mid vessel is then stented.  There is a focal area of 20% in-      stent restenosis in the mid section.  The first marginal has a 50%      stenosis distally.  The AV groove circumflex just distal to the      stented segment has a 60% stenosis in a segment of vessel that is      well under 2 mm in diameter.  6. RCA:  The vessel is occluded proximally.  The vein graft to the PDA      is occluded.  There is a large collateral from the circumflex to      the posterior left  ventricular branch; this supplies excellent      collateralization to the RCA.  The PDA has a short-segment total      occlusion with the distal PDA being supplied via combination of      bridging collaterals and septals collaterals.   IMPRESSION/PLAN:  There has been no change in his coronary anatomy  compared with November 2006.  Specifically, the left internal mammary  artery to left anterior descending graft remains patent.  In addition,  the stented portion of the circumflex remains widely patent.  Ejection  fraction remains normal.  We will plan on continuing with his current  medical therapy.      Salvadore Farber, MD  Electronically Signed     WED/MEDQ  D:  08/20/2006  T:  08/21/2006  Job:  [id number]   cc:   Learta Codding, MD,FACC

## 2010-11-30 NOTE — Op Note (Signed)
Henry [name], Henry [name]                            ACCOUNT NO.:  [id number]   MEDICAL RECORD NO.:  [id number]                   PATIENT TYPE:  INP   LOCATION:  2303                                 FACILITY:  [facility]   PHYSICIAN:  Salvatore Decent. Cornelius Moras, M.D.              DATE OF BIRTH:  10/30/1940   DATE OF PROCEDURE:  03/14/2004  DATE OF DISCHARGE:                                 OPERATIVE REPORT   PREOPERATIVE DIAGNOSIS:  Severe three-vessel coronary artery disease with  class IV unstable angina, status post acute non-Q wave myocardial  infarction.   POSTOPERATIVE DIAGNOSIS:  Severe three-vessel coronary artery disease with  class IV unstable angina, status post acute non-Q wave myocardial  infarction.   OPERATION PERFORMED:  Median sternotomy for coronary artery bypass grafting  times three and coronary artery endarterectomy times one (left internal  mammary artery to distal left anterior descending coronary artery with  endarterectomy, saphenous vein graft to circumflex marginal branch,  saphenous vein graft to posterior descending coronary artery, endoscopic  saphenous vein harvest from bilateral thighs.   SURGEON:  Salvatore Decent. Cornelius Moras, M.D.   ASSISTANT:  Jerold Coombe, P.A.   ANESTHESIA:  General.   INDICATIONS FOR PROCEDURE:  The patient is a 70 year old male from [place],  [place] with history of hypertension, type 2 diabetes mellitus,  strong family history of coronary artery disease, and a remote history of  tobacco use.  The patient presents with a two to three-month history of  classical symptoms of angina.  He developed pain occurring at rest on August  23 prompting hospital evaluation.  He ruled in for an acute non-Q wave  myocardial infarction.  He was stabilized medically and cardiac  catheterization was performed demonstrating severe three-vessel coronary  artery disease with preserved left ventricular function.  A full  consultation note has been dictated  previously.   OPERATIVE CONSENT:  The patient and his family have been counseled at length  regarding the indications and potential benefits of coronary artery bypass  grafting.  Alternative treatment strategies have been discussed.  They  understand and accept all associated risks of surgery including but not  limited to risks of death, stroke, myocardial infarction, congestive heart  failure respiratory failure, pneumonia, bleeding requiring blood  transfusion, arrhythmia, infection and recurrent coronary artery disease.  They understand that based upon the radiographic appearance from the  patient's cardiac catheterization the patient has diffuse distal disease  perhaps with poor targets for bypass grafting.  He is felt to be at  increased risk for perioperative myocardial infarction or perhaps premature  graft failure or premature recurrent coronary artery disease in the future.  All of their questions have been addressed.   OPERATIVE FINDINGS:  Severe diffuse distal coronary artery disease with very  poor targets for grafting. The patient would not be felt to be a candidate  for redo coronary artery bypass grafting in  the future.   DESCRIPTION OF PROCEDURE:  The patient was brought to the operating room on  the above mentioned date and central monitoring was established by the  anesthesia service under the care and direction of W. Autumn Patty, MD.  Specifically, a Swann-Ganz catheter was placed through the right internal  jugular approach.  A radial arterial line was place.  Intravenous  antibiotics were administered.  Following induction with general  endotracheal anesthesia, a Foley catheter was placed.  The patient's chest,  abdomen, both groins and both lower extremities were prepared and draped in  sterile manner.   Baseline transesophageal echocardiogram was performed by Dr. Sampson Goon.  These demonstrate the presence of essentially normal left ventricular  function.   There is mild to moderate left ventricular hypertrophy.  No other  significant abnormalities were identified.  Endoscopic saphenous vein  harvest was performed initially from the patient's left thigh through a  small incision made just above the left knee.  The saphenous vein was  relatively poor quality due to the presence of significant varicosities.  The greater saphenous vein was removed from the left thigh but due to these  areas of  varicosities, several segments of the vein were not usable.  Therefore, vein was cut into segments and subsequently reconstructed using  end-to-end anastomotic techniques.  Ultimately, a single adequate segment of  saphenous vein was obtained from the left thigh.  Subsequently, additional  saphenous vein was obtained from the patient's right thigh again using  endoscopic vein harvest technique. This vein was also large caliber but has  fewer areas of varicosities.  After the saphenous vein was removed from both  thighs, the surgical incisions in both legs were closed in multiple layers  with running absorbable suture.   A median sternotomy incision was performed and the left internal mammary  artery was dissected from the chest wall and prepared for bypass grafting.  The left internal mammary artery was good quality conduit.  The patient was  heparinized systemically.  The pericardium was opened.  The ascending aorta  was mildly dilated and sclerotic.  There were no palpable plaques or  calcifications.  The ascending aorta and the right atrium were cannulated  for cardiopulmonary bypass.  A retrograde cardioplegia catheter was placed  through the right atrium into the coronary sinus.  Adequate heparinization  was verified.   Cardiopulmonary bypass was begun and the surface of the heart was inspected.  There was mild to moderate left ventricular hypertrophy.  There was diffuse coronary artery disease with severely calcified visible and palpable plaque   throughout all of the epicardial coronary arteries.  There were no good  targets for bypass grafting at all.  The diagonal branches were small and  diffusely diseased.  There was some marbling within the myocardium,  particularly in the distal anterior wall and apex suggestive of previous  myocardial infarction.  A temperature probe was placed in the left  ventricular septum.  A cardioplegia catheter was placed in the ascending  aorta.   The patient was cooled to 32 degrees systemic temperature.  The aortic cross-  clamp was applied and cardioplegia was delivered initially in an antegrade  fashion through the aortic root.  Iced saline slush was applied for topical  hypothermia.  Supplemental cardioplegia was administered retrograde to the  coronary sinus catheter.  The initial cardioplegic arrest and myocardial  cooling were felt to be excellent.  Repeat doses of cardioplegia were  administered intermittently throughout the cross-clamp  portion of the  operation antegrade through the aortic root, antegrade down the subsequently  placed vein grafts, and retrograde through the coronary sinus catheter to  maintain septal temperature below 15 degrees centigrade.  The following  distal coronary anastomoses were performed:  (1)  The posterior descending  coronary artery was grafted with the saphenous vein graft in an end-to-side  fashion.  This coronary artery was diffusely diseased and poor quality  target.  There was hard palpable plaque throughout the entire distal right  coronary artery and all of its branches.  The anastomosis was placed just  beyond the bifurcation of the distal right coronary artery on the proximal  portion of the posterior descending coronary artery.  At this level, the  internal diameter of the vessel measured 1.5 mm and a 1.5 mm probe would  pass retrograde.  A 1.5 probe would not pass distally although a 1.0 probe  would.  (2)  The first circumflex marginal branch was  grafted with a  saphenous vein graft in end-to-side fashion.  This coronary artery was also  diffusely diseased and very poor target for grafting. The only suitable site  for grafting was chosen and at this level the vessel measured 1.5 mm in  diameter.  A 1.5 probe would pass a short distance in both directions.  The  second circumflex marginal branch was too diffusely diseased and small for  grafting.  (3)  The distal left anterior descending coronary artery was  grafted with the left internal mammary artery in an end-to-side fashion.  This coronary artery was very poor and extraordinarily severely diseased.  There were no satisfactory sites for grafting and after initial arteriotomy  was attempted, it was clear that endarterectomy was necessary. Coronary  artery endarterectomy was performed using the eversion technique. A long  segment of hard, calcified plaque could be extracted from the vessel in both directions and the distal end seemed to feather fairly nicely.  After  completion of the endarterectomy, the vessel was irrigated with heparinized  saline and the distal anastomosis was then performed after opening the left  internal mammary artery to create a long beveled anastomosis.   Both proximal saphenous vein anastomoses were performed directly to the  ascending aorta prior to the removal of the aortic cross-clamp.  The septal  temperature was noted to rise appropriately upon reperfusion of the left  internal mammary artery.  One final dose of warm retrograde hot shot  cardioplegia was administered.  All air was evacuated from the aortic root.  The aortic cross-clamp was removed after a total cross-clamp of 81 minutes.   The heart began to beat spontaneously without need for cardioversion.  All  proximal and distal coronary anastomoses were inspected for hemostasis and  appropriate graft orientation.  Epicardial pacing wires were fixed to the  right ventricular outflow tract and  to the right atrial appendage.  The  patient was rewarmed to 37 degrees centigrade temperature.  The patient was  weaned from cardiopulmonary bypass without difficulty.  The patient's rhythm  at separation from bypass was normal sinus rhythm.  No inotropic support was  required.  Total cardiopulmonary bypass time from the operation was 97  minutes.  Transesophageal echocardiogram performed by Dr. Sampson Goon after  separation from bypass demonstrated preserved left ventricular function with  no abnormalities.   The venous and arterial cannulae were removed uneventfully.  Protamine was  administered to reverse the anticoagulation.  The mediastinum and the left  chest were  irrigated with saline solution containing vancomycin.  Meticulous  surgical hemostasis was ascertained.  The mediastinum and the left chest  were drained with three chest tubes placed through separate stab incisions  inferiorly.  The median sternotomy was closed in routine fashion. The soft  tissues anterior to the sternum were closed in multiple layers and the skin  was closed with a running subcuticular skin closure.   The patient tolerated the procedure well and was transported to the surgical  intensive care unit in stable condition. There were no intraoperative  complications.  All sponge, needle and instrument counts were verified  correct at the completion of the operation.  No blood products were  administered.                                               Salvatore Decent. Cornelius Moras, M.D.    CHO/MEDQ  D:  03/14/2004  T:  03/15/2004  Job:  [id number]   cc:   Learta Codding, M.D. [facility]   Carole Binning, M.D. [facility]   Delaney Meigs, M.D.  [address]  [place]  [place] [address]  Fax: [phone]   [facility], [facility], [place]

## 2010-11-30 NOTE — Consult Note (Signed)
Henry [name], Henry [name]                            ACCOUNT NO.:  [id number]   MEDICAL RECORD NO.:  [id number]                   PATIENT TYPE:  INP   LOCATION:  2022                                 FACILITY:  [facility]   PHYSICIAN:  Claudette Laws, M.D.               DATE OF BIRTH:  10/30/1940   DATE OF CONSULTATION:  03/08/2004  DATE OF DISCHARGE:                                   CONSULTATION   REFERRING PHYSICIAN:  Learta Codding, M.D.   CHIEF COMPLAINT:  Blood in urine.   HISTORY OF PRESENT ILLNESS:  This 70 year old white male was admitted two  days ago with angina, chest pain.  He has a known history of coronary artery  disease.  He came in through the ER.  He had a cardiac catheterization and  then noted some gross hematuria.  The patient has no prior history of  bladder outlet obstruction or  BPH symptoms.  He does have a history of  kidney stones in the past.  No past history of hematuria that he is aware  of.   The patient underwent a cardiac catheterization on March 06, 2004, and is  to undergo bypass surgery on March 09, 2004.  Presently the patient is  voiding without much difficulty.  His urine is tea-colored when I saw him in  his room.   The patient is currently retired, denies alcohol use.  He has been on  prednisone recently.   MEDICATIONS AT HOME:  1. Singulair 10 mg a day.  2. Amaryl 2 mg a day.  3. Advair 250/50 one puff b.i.d.  4. Prilosec.  5. Nasonex 50 mg a day.  6. Timoprazole 20 mg a day.   The patient has a history of sinus trouble, asthma, GERD.   PHYSICAL EXAMINATION:  GENERAL:  No acute distress.  ABDOMEN:  Benign.  No obvious masses or hernias or adenopathy.  GENITALIA:  Uncircumcised male, normal penis and meatus.  No lesions.  Scrotum normal in appearance.  No rashes.  Testicles bilaterally  symmetrical.  No obvious testicular masses.  Normal spermatic cord.  RECTAL:  Examination of the prostate reveals a flat, +1, smooth, benign,  nontender  gland.  No nodules.  No other anorectal lesions.  Good sphincter  tone.   LABORATORY AND X-RAY DATA:  Pertinent laboratory data is BUN 22, creatinine  1.2.  His urine culture is still pending.   IMPRESSION:  1. Painless gross hematuria, possibly secondary to anticoagulation effect.  2. Severe coronary artery disease.  3. Hypertension.  4. Type 2 diabetes mellitus.  5. Chronic sinusitis.  6. Asthma.  7. Remote history of nephrolithiasis.   DISCUSSION:  I went over the findings with the patient and the family, and I  encouraged him to go ahead with his surgery tomorrow.  We will see how he  gets along, and then I will see him in followup,  and then work him up from  the hematuria standpoint.  For now before he goes home, we could probably  screen him with a renal ultrasound, possibly a urine cytology if hematuria  persists.  Ultimately, I will see him in the office for followup, and if he  needs cystoscopy performed, cystoscopy in the office.                                               Claudette Laws, M.D.    RFS/MEDQ  D:  03/08/2004  T:  03/08/2004  Job:  [id number]   cc:   Learta Codding, M.D. [facility]

## 2010-11-30 NOTE — Consult Note (Signed)
NAMEGREG, Henry [name]                            ACCOUNT NO.:  [id number]   MEDICAL RECORD NO.:  [id number]                   PATIENT TYPE:  INP   LOCATION:  1832                                 FACILITY:  [facility]   PHYSICIAN:  Salvatore Decent. Cornelius Moras, M.D.              DATE OF BIRTH:  10/30/1940   DATE OF CONSULTATION:  03/06/2004  DATE OF DISCHARGE:                                   CONSULTATION   CONSULTING PHYSICIAN:  Salvatore Decent. Cornelius Moras, M.D.   REQUESTING PHYSICIAN:  Carole Binning, M.D. [facility].   PRIMARY CARDIOLOGIST:  Learta Codding, M.D. [facility]   PRIMARY CARE PHYSICIAN:  Delaney Meigs, M.D.   REASON FOR CONSULTATION:  Severe three-vessel coronary artery disease with  class IV unstable angina, status post acute non-Q wave myocardial  infarction.   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  He has no  previous history of coronary artery disease but risk factors notable for a  history of hypertension, type 2 diabetes mellitus, strong family history of  coronary artery disease, and remote history of tobacco use.  Henry [name]  describes a 2-3 month history of progressive symptoms of classical  exertional angina.  He describes substernal chest tightness which is brought  on by activity and relieved by rest.  This has increased in frequency and  severity over several months.  This morning he was awoken from  his sleep at  approximately 6:30 a.m. with similar severe substernal chest tightness  occurring at rest.  This was associated with mild shortness of breath and  nausea.  He presented to the hospital where he was initially evaluated by  Dr. Andee Lineman.  Baseline electrocardiogram was notable for Q waves in the  anteroseptal leads and diffuse nonspecific ST changes consistent with acute  coronary ischemia.  His initial set of  cardiac enzymes was positive with a  total CK of 213 and a CK-MB fraction of 7.2.  His symptoms of chest pain  resolved with nitrates.  He was brought to [facility] for urgent  catheterization.  This was performed this afternoon by Dr. Gerri Spore.  Findings at the time of catheterization are notable for severe three-vessel  coronary artery disease with only mild left ventricular dysfunction.  Cardiac surgical consultation has been requested to consider surgical  revascularization.   REVIEW OF SYSTEMS:  GENERAL:  The patient reports good appetite.  He has not  been gaining or loosing weight.  CARDIAC:  Notable for symptoms of  progressive exertional angina culminating in today's acute episode this  morning.  He reports only mild exertional shortness of breath.  He denies  history of resting shortness of breath, PND, orthopnea, or lower extremity  edema.  He has had  occasional palpitations.  RESPIRATORY:  Notable for  longstanding history of asthma and chronic sinus drainage, associated with  this, he has intermittent cough.  He denies any productive cough,  hemoptysis, wheezing.  GASTROINTESTINAL:  Negative at present.  The patient  has had problems with symptoms of reflux in the past but this has been well  controlled on medical therapy for the last 7-8 years.  He denies difficulty  swallowing.  He denies history of hematochezia, hematemesis, melena.  MUSCULOSKELETAL:  Negative.  The patient denies arthritis or arthralgias.  NEUROLOGIC:  Negative.  The patient denies symptoms suggestive of previous  TIA, stroke, or seizures.  GENITOURINARY:  Negative.  The patient denies  urinary urgency or frequency.  INFECTIOUS:  Notable for chronic sinusitis.  He was seen in the office yesterday by Dr. Lysbeth Galas and started on a  prednisone taper and oral antibiotics due to persistent sinus drainage.  HEMATOLOGIC:  Negative.  ENDOCRINE:  Notable for type 2 diabetes mellitus  for which the patient  has been treated for the last 2-3 years.  He states  that he checks his sugars approximately three times each week, and he thinks  they have been under good control.  PSYCHIATRIC:  Negative.  HEENT:  Notable  for poor dentition as well as known nasal polyps.   PAST MEDICAL HISTORY:  1. Hypertension.  2. Type 2 diabetes mellitus.  3. Nasal polyps.  4. Chronic sinusitis.  5. Asthma.  6. Remote history of tobacco use.  7. Strong family history of coronary artery disease.   PAST SURGICAL HISTORY:  None.   FAMILY HISTORY:  The patient's father died of a myocardial infarction at age  20.   SOCIAL HISTORY:  The patient is married and lives in [place] with his wife.  He is retired from the town of [place].  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  He has a remote history  of tobacco use, although he quit smoking in 1974.  The patient denies a  history of excessive alcohol consumption.   MEDICATIONS:  Prior to admission:  1. Nifedipine XL 90 mg daily.  2. Singulair 10 mg daily.  3. Amaryl 2 mg daily.  4. Advair Diskus 250/50 one puff twice daily.  5. Prilosec 20 mg daily.  6. Nasonex 50 mcg daily.  7. Prednisone taper, started March 05, 2004.  8. Ketek 800 mg daily for six days beginning March 05, 2004.   DRUG ALLERGIES:  None known.   PHYSICAL EXAMINATION:  GENERAL:  The patient is a well-appearing white male  who appears his stated age in no acute distress.  VITAL SIGNS:  Blood pressure is 121/69, pulse 99.  He is in sinus rhythm and  afebrile.  HEENT:  Grossly unrevealing, although notable for poor dentition.  NECK:  Supple.  There is cervical nor supraclavicular lymphadenopathy.  There is no jugular venous distention.  No carotid bruits are noted.  CHEST:  Auscultation of the chest reveals fairly clear breath sounds bilaterally.  There are a few scattered bibasilar inspiratory crackles.  No  wheezes or rhonchi are noted.   CARDIOVASCULAR:  Includes a regular rate and rhythm.  No murmurs, rubs or  gallops are appreciated.  ABDOMEN:  Soft and nontender.  There are no palpable masses.  Bowel sounds  are present.  EXTREMITIES:  Warm and adequately perfused.  There is no lower extremity  edema.  Distal pulses are diminished in both lower legs at the  ankle.  There  is no sign of venous insufficiency.  RECTAL:  Deferred.  GU:  Deferred.  There is a compression dressing in the right groin from  cardiac catheterization.  NEUROLOGIC:  Grossly nonfocal.  The remainder of the patient's physical exam  is noncontributory.   DIAGNOSTIC TESTS:  Cardiac catheterization, performed by Dr. Gerri Spore  today, has been reviewed.  This demonstrates severe three-vessel coronary  artery disease with diffuse disease throughout all of the coronary arteries  and extremely heavy calcification throughout all of the vessels.  There is  50-70% proximal stenosis of the left anterior descending coronary artery,  followed by long segment 70% stenosis, followed by 95% stenosis, followed by  occlusion of the left anterior descending coronary artery just after take-  off of a medium sized diagonal branch.  There is 95% proximal stenosis in  the diagonal branch.  The distal left anterior descending coronary artery  can be seen late through left to left collaterals.  It appears to be  diffusely diseased and may be a very poor target for grafting.  The left  circumflex coronary artery gives rise to a large first circumflex marginal  branch which has long segment 80% proximal stenosis.  Beyond take-off of the  first circumflex marginal branch, there is long segment 80% stenosis of the  second circumflex marginal branch which also bifurcates.  There is 100%  proximal occlusion of the right coronary artery with left to right  collateral filling of the distal right coronary circulation.  Left  ventricular function is only mildly reduced with moderate  apical akinesis.  There is mitral regurgitation.   IMPRESSION:  1. Severe three-vessel coronary artery disease with mild left ventricular     dysfunction.  2. Class IV unstable angina with acute non-Q wave myocardial infarction.   Henry [name] has very diffuse distal coronary artery disease with relatively  poor targets for bypass grafting.  However, I believe that an attempt at  surgical revascularization will be the best long term treatment strategy.  He will be at somewhat increased risk for a perioperative myocardial  infarction or premature graft failure or recurrent symptoms or signs of  coronary artery disease.  However, his long term prognosis with medical  therapy would be poor.  Furthermore, his coronary anatomy would not be  approachable via percutaneous coronary intervention.   PLAN:  I have outlined options at length with Henry [name] and his family. All of their questions have been addressed.  They understand and accept all  associated risks of surgery including but not limited to the risk of death,  stroke, myocardial infarction, congestive heart failure, respiratory  failure, pneumonia, bleeding requiring blood transfusion, arrhythmia,  infection, and recurrent coronary artery disease.  All of their questions  have been addressed.  We tentatively plan to proceed with surgery on Friday,  March 09, 2004.                                               Salvatore Decent. Cornelius Moras, M.D.    CHO/MEDQ  D:  03/06/2004  T:  03/06/2004  Job:  [id number]   cc:   Learta Codding, M.D. [facility]   Carole Binning, M.D. [facility]   Delaney Meigs, M.D.  [address]  [place]  [place] [address]  Fax: [phone]   [facility]  [place], [place]  [facility]  [place], [place]

## 2010-11-30 NOTE — Discharge Summary (Signed)
NAMEJAIRE, Henry [name]                ACCOUNT NO.:  [id number]   MEDICAL RECORD NO.:  [id number]          PATIENT TYPE:  INP   LOCATION:  3736                         FACILITY:  [facility]   PHYSICIAN:  Salvadore Farber, M.D. LHCDATE OF BIRTH:  1941-02-02   DATE OF ADMISSION:  05/19/2005  DATE OF DISCHARGE:  05/20/2005                                 DISCHARGE SUMMARY   DISCHARGE DIAGNOSIS:  Chest pain, no critical coronary artery disease by  cardiac catheterization this admission with medical therapy for distal  circumflex 80% and posterior descending artery 95%, collaterals.   SECONDARY DIAGNOSIS:  1.  Status post aortocoronary bypass surgery in 1995 with left internal      mammary artery to the left anterior descending, saphenous vein graft to      obtuse marginal, and saphenous vein graft to right coronary artery.  2.  Status post non-Q wave myocardial infarction in 1995.  3.  History of chest discomfort and abnormal stress test with saphenous vein      graft to obtuse marginal and saphenous vein graft to right coronary      artery totally occluded and drug eluting stent placed to the circumflex      (right coronary artery totally occluded without intervention).  4.  Asthma.  5.  Hyperlipidemia.  6.  Gastroesophageal reflux disease symptoms.  7.  Remote history of tobacco use.  8.  Family history of coronary artery disease in his father who died of an      myocardial infarction at age 65.  7.  Chronic sinusitis.  10. Type 2 diabetes.   ALLERGIES:  No known drug allergies.   PROCEDURE:  1.  Cardiac catheterization.  2.  Coronary arteriogram.  3.  Left ventriculogram.  4.  Left internal mammary artery angiogram.   HOSPITAL COURSE:  Henry [name] is a 70 year old male with known coronary  artery disease.  He came to the hospital because he was awakened with  moderately severe left chest pain and took sublingual nitroglycerin without  much improvement.  EMS transported him to  [facility] where  additional oral and intravenous  nitroglycerin as well as IV beta blocker  improved his chest discomfort.  He was transferred to [facility]  for further evaluation.   His cardiac enzymes were negative for MI and a cardiac catheterization was  performed on May 20, 2005.  The LAD was widely patent and the circumflex  stent was widely patent, as well.  The RCA was totally occluded, but this  was old.  There was 95% stenosis in the PDA but this is a small vessel and  an 80% stenosis in the circumflex which was not flow limiting.   Dr. Samule Ohm evaluated the films and felt that there was no clear culprit for  anginal chest pain.  Henry [name] was stable post cath and ambulating without  chest pain or shortness of breath.  He was discharged on May 20, 2005,  with outpatient follow up arranged.   DISCHARGE INSTRUCTIONS:  He is to stick to a low fat diabetic diet.  He  is  to call our office for problems with the cath site.  He is to follow up with  Dr. Andee Lineman on November 15 and with Dr. Lysbeth Galas as needed.   DISCHARGE MEDICATIONS:  1.  Plavix 75 mg daily.  2.  Lipitor 40 mg daily.  3.  Altace 10 mg daily.  4.  Procardia XL 90 mg daily.  5.  Protonix 40 mg daily.  6.  Advair 150/50 daily.  7.  Coated aspirin 325 mg daily.  8.  Lopressor 50 mg b.i.d.  9.  Nasonex daily.  10. Amaryl 2 mg daily.  11. Nitroglycerin sublingual p.r.n.      Theodore Demark, P.A. [facility]      Salvadore Farber, M.D. [facility]  Electronically Signed    RB/MEDQ  D:  05/20/2005  T:  05/20/2005  Job:  [id number]   cc:   Learta Codding, M.D. [facility]  [address]  [address]  [place]  [place] [address]   Delaney Meigs, M.D.  Fax: [phone]

## 2010-11-30 NOTE — Cardiovascular Report (Signed)
NAMEOSTIN, MATHEY                ACCOUNT NO.:  [id number]   MEDICAL RECORD NO.:  [id number]          PATIENT TYPE:  INP   LOCATION:  3736                         FACILITY:  [facility]   PHYSICIAN:  Salvadore Farber, M.D. LHCDATE OF BIRTH:  [birth year]   DATE OF PROCEDURE:  05/20/2005  DATE OF DISCHARGE:  05/20/2005                              CARDIAC CATHETERIZATION   PROCEDURE:  Coronary angiography, left internal mammary artery angiography.  Starlix closure of the right, femoral arteriotomy site.   CARDIOLOGIST:  Salvadore Farber, M.D.   INDICATIONS:  Mr. Riebel is a 69 year old gentleman status post coronary  artery bypass grafting in August 2005 by Dr. Cornelius Moras.  He then represented 2  weeks ago with several weeks of new onset exertional angina. He was found to  have occlusion of both vein grafts with continued patency of his LIMA to his  LAD.  He had at least an 80% stenosis of the proximal circumflex  compromising perfusion of both the native circumflex territory as well as  the distal RCA which was supplied via a large collateral from the proximal  circumflex.  On October 24, I placed a 2.5 x 28 mm CYPHER stent in the  ostium of the circumflex extending down to overlap a previously placed stent  in the mid circumflex.  He has generally done well since then a resolution  of his exertional angina.  However, yesterday morning he awoke with  substernal chest discomfort, reminiscent of his prior angina.  It was  unaffected by nitroglycerin.  It lasted several hours.  Electrocardiogram  and serial enzymes demonstrated no evidence of myocardial injury.  Due to  his recent stent placement and a large territory potential risk, he was  referred for diagnostic angiography.   PROCEDURAL TECHNIQUE:  Informed consent was obtained. Under 1% lidocaine  local anesthesia, a 5-French sheath was placed in the left femoral artery  using the modified Seldinger technique. A diagnostic angiography of  the left  system was performed using a JL-4 catheter.  LIMA angiography was performed  using a LIMA catheter.  The arteriotomy was then closed using a StarClose  device.  Complete hemostasis was obtained.  The patient was then transferred  to the holding room in stable condition having tolerated the procedure well.   COMPLICATIONS:  None.   FINDINGS:  1.  Left main angiographically normal.  2.  LAD: Vessel is occluded in the midsection.  The LIMA to LAD is widely      patent with excellent distal runoff.  3.  Circumflex: Widely patent stents from the ostium through the mid vessel.      The marginal has two major divisions.  The proximal portion of both of      the more lateral of these is severely and diffusely diseased.  There is      at least a 50% stenosis in the more medial of the two branches.  4.  RCA: Previously documented occluded with occluded vein graft to it.  It      is nicely collateralized from the circumflex.   IMPRESSION/PLAN:  Widely patent circumflex stent.  No change in anatomy from  the completion of his intervention 2 weeks ago. I suspect noncardiac  etiology to his chest discomfort.      Salvadore Farber, M.D. [facility]  Electronically Signed     WED/MEDQ  D:  05/20/2005  T:  05/20/2005  Job:  [id number]   cc:   Learta Codding, M.D. [facility]  [address]  [address]  [place]  [place] [address]   Delaney Meigs, M.D.  Fax: [phone]

## 2010-11-30 NOTE — Discharge Summary (Signed)
NAMECLELAND, SIMKINS                ACCOUNT NO.:  [id number]   MEDICAL RECORD NO.:  [id number]          PATIENT TYPE:  INP   LOCATION:  2899                         FACILITY[facility]   PHYSICIAN:  Salvadore Farber, MD  DATE OF BIRTH:  08-10-1940   DATE OF ADMISSION:  08/20/2006  DATE OF DISCHARGE:  08/20/2006                               DISCHARGE SUMMARY   CARDIOLOGIST:  Dr. Willa Rough   PRIMARY CARE PHYSICIAN:  Dr. Joette Catching   REASON FOR ADMISSION:  Chest pain and abnormal myocardial perfusion  study.   DISCHARGE DIAGNOSES:  1. Noncardiac chest pain, etiology unclear.  2. Coronary artery disease.        A:  Stable anatomy by catheterization this admission - medical  therapy recommended.        B:  Non-ST-elevation myocardial infarction August of 2005 with  subsequent three-vessel CABG (LIMA to LAD, vein graft to the obtuse  marginal, vein graft to the PDA).        C:  October of 2006:  CYPHER stenting to the circumflex; totally  occluded vein graft with widely patent LIMA graft.        D:  Cardiac catheterization November of 2006 with patent  circumflex stents, patent LIMA graft, totally occluded vein graft with  distal collateralization.  1. Good left ventricular function.  2. Diabetes mellitus.  3. Hyperlipidemia.  4. Hypertension.  5. Chronic obstructive pulmonary disease.  6. Gastroesophageal reflux disease.  7. Degenerative disc disease status post L4-L5 lumbar laminectomy in      December of 2007.  8. Ex-smoker.   PROCEDURES PERFORMED THIS ADMISSION:  Cardiac catheterization by Dr.  Randa Evens August 20, 2006 revealing a patent LIMA to the LAD,  known totally occluded vein graft and patent stented circumflex with  mild-to-moderate residual nonobstructive disease elsewhere.  EF is  normal at 70% without regional wall motion abnormalities.  Femoral  artery site closed with StarClose device.   HISTORY:  Henry [name] is a 70 year old male patient followed  by Dr. Myrtis Ser  in our [facility] who was seen in consultation at [facility] on  August 18, 2006.  He presented to the hospital with intermittent chest  pain that had been ongoing for two days.  It awakened him from sleep.  It was left sided which was reminiscent of the pain he had prior to his  myocardial infarction.  Patient took nitroglycerin without relief.  His  cardiac markers were negative.  Our service set him up for stress  myocardial perfusion to further assess his pain.  This returned abnormal  with defects in the inferior and lateral wall.  He was transferred to  [facility] for cardiac catheterization to further evaluate his  pain and abnormal stress test.   HOSPITAL COURSE:  Patient was transferred to [facility] on  August 20, 2006.  He went to the cardiac catheterization lab with Dr.  Samule Ohm.  He underwent cardiac catheterization as noted above.  The  results as noted above showed stable anatomy consistent with his  previous cardiac catheterization.  It was recommended he continue on  medical therapy.  At the time of this dictation, the patient is pending  post catheterization bedrest and hydration.  He did have some continued  pain post catheterization and he was given morphine and IV Protonix.  As  long as he remains pain free after his fluids have been completed, he  can be discharged to home later this evening.  He will follow up with  Dr. Myrtis Ser in [place].   LABS AND X-RAY DATA:  Stress Myoview as noted above.   On February 4, glucose 128, BUN 18, creatinine 1.2, sodium 138,  potassium 3.6, cardiac markers negative x3, INR 1.0, white count 12,000,  hemoglobin 15.5, hematocrit 45, platelet count 374,000.   Chest x-ray from [facility] according to the note showed no acute  disease.   DISCHARGE MEDICATIONS:  1. Lipitor 40 mg daily.  2. Altace 10 mg daily.  3. Metoprolol 50 mg b.i.d.  4. Coated aspirin 325 mg daily.  5. Nifedipine 90 mg  daily.  6. Prilosec 40 mg daily.  7. Singulair 10 mg daily.  8. Advair 100/50 b.i.d.  9. Nasonex nasal spray as directed.  10.Amaryl 2 mg daily.  11.Amoxicillin as directed (he recently was diagnosed with sinusitis).  12.Nitroglycerin p.r.n. chest pain.  13.Plavix 75 mg daily.   DIET:  Low fat, low-sodium diabetic diet.   ACTIVITY:  He is to increase his activity slowly.   WOUND CARE:  He should call our office in [facility] for any groin swelling,  bleeding or bruising or fever.   FOLLOWUP:  1. The patient will see Dr. Myrtis Ser February 13 at 10:00 a.m. in our [place]      office.  2. He should follow up with Dr. Lysbeth Galas in the next week and he should      call for an appointment.   Total physician and PA time greater than 30 minutes on this discharge.      Henry Newcomer, PA-C      Salvadore Farber, MD  Electronically Signed    SW/MEDQ  D:  08/20/2006  T:  08/21/2006  Job:  [id number]   cc:   Delaney Meigs, M.D.

## 2010-11-30 NOTE — Cardiovascular Report (Signed)
Henry [name], Henry [name]                ACCOUNT NO.:  [id number]   MEDICAL RECORD NO.:  [id number]          PATIENT TYPE:  INP   LOCATION:  6526                         FACILITY:  [facility]   PHYSICIAN:  Salvadore Farber, M.D. LHCDATE OF BIRTH:  1941-04-17   DATE OF PROCEDURE:  05/07/2005  DATE OF DISCHARGE:                              CARDIAC CATHETERIZATION   PROCEDURE:  Drug-eluting stent placement in the proximal circumflex.   INDICATIONS:  Henry [name] is a 70 year old gentleman who underwent coronary  artery bypass grafting in August 2000.  He presented with several weeks of  new onset exertional angina.  I performed diagnostic angiography yesterday.  This demonstrated occlusion of both vein grafts with continued patency of  his LIMA to the LAD.  He had at least 80% stenosis of the proximal  circumflex compromising perfusion both of the native circumflex territory as  well as of the distal RCA which is supplied via a large collateral from the  proximal circumflex.  He returns to the laboratory today for planned  percutaneous revascularization of the proximal circumflex.   PROCEDURAL TECHNIQUE:  Informed consent was obtained.  Under 1% lidocaine  local anesthesia a 6-French sheath was placed in the right common femoral  artery using the modified Seldinger technique.  Anticoagulation was  initiated with bivalirudin.  ACT was confirmed to be greater than 225  seconds.  A Prowater wire was advanced to the distal circumflex without  difficulty.  I then attempted to wire the atrial branch which collateralizes  the RCA to protect it during angioplasty across its origin.  I initially  tried with a BMW wire and then a Whisper wire and was unsuccessful.  I then  tried with a Steer-It wire and was again unsuccessful.  Finally, I tried  with a Venture catheter, again without success.  I then decided to go ahead  and dilate without it protected.  I pre dilated the lesions over the  Prowater wire  using a 2.5 x 20 mm Maverick at 10 atmospheres distally and 8  atmospheres proximally.  I then attempted to stent across the entirety of  the lesions from the ostium of the circumflex into the previously stented  segment using a 2.5 x 28 mm CYPHER.  I was unable, however, to advance it  beyond the prior stent.  I therefore exchanged through a balloon catheter  for a wiggle wire.  With this in place I was able to advance the stent  without difficulty.  It was positioned at the ostium of the circumflex so as  to overlap the proximal portion of the previously placed stent on its distal  end.  I then deployed it at 14 atmospheres.  I then post dilated the distal  margin of the stent using a 3 x 20 mm Quantum at 18 atmospheres and the  proximal margin at 20 atmospheres.  Repeat angiography demonstrated  approximately 20% residual stenosis proximally.  I then redilated using a 3  mm Quantum at 26 atmospheres at the ostium.  Final angiography demonstrated  two segments of approximately 10% residual stenosis.  The atrial branch  supplying the RCA was intentionally jailed.  There was no compromise of its  origin and there remains TIMI 3 flow in it.   COMPLICATIONS:  None.   IMPRESSION/RECOMMENDATION:  Successful drug-eluting stent placement in the  proximal circumflex.  Patient will be maintained on a combination of aspirin  and Plavix indefinitely.      Salvadore Farber, M.D. [facility]  Electronically Signed     WED/MEDQ  D:  05/07/2005  T:  05/07/2005  Job:  [id number]   cc:   Delaney Meigs, M.D.  Fax: [phone]

## 2010-12-21 ENCOUNTER — Encounter: Payer: Self-pay | Admitting: Cardiology

## 2010-12-26 ENCOUNTER — Ambulatory Visit (INDEPENDENT_AMBULATORY_CARE_PROVIDER_SITE_OTHER): Payer: Medicare Other | Admitting: Cardiology

## 2010-12-26 ENCOUNTER — Encounter: Payer: Self-pay | Admitting: Cardiology

## 2010-12-26 VITALS — BP 131/79 | HR 61 | Ht 68.0 in | Wt 192.0 lb

## 2010-12-26 DIAGNOSIS — I10 Essential (primary) hypertension: Secondary | ICD-10-CM

## 2010-12-26 DIAGNOSIS — J690 Pneumonitis due to inhalation of food and vomit: Secondary | ICD-10-CM

## 2010-12-26 DIAGNOSIS — I2510 Atherosclerotic heart disease of native coronary artery without angina pectoris: Secondary | ICD-10-CM

## 2010-12-26 DIAGNOSIS — Z87891 Personal history of nicotine dependence: Secondary | ICD-10-CM

## 2010-12-26 NOTE — Progress Notes (Signed)
HPI the patient is a 70 year old male with a history of coronary artery disease status post prior bypass surgery.  The patient presented with a non-ST elevation myocardial infarction July 15, 2010.  Reportedly the patient had symptoms of bronchitis and diarrhea he was very dehydrated and when he coughed he had a syncopal episode.  On arrival in the emergency room his blood pressure was very low approximately 80 systolic.  He was given IV fluids and transferred for cardiac catheterization.  He was found to have well preserved LV function with mild apical hypokinesis but with a new total occlusion of the branch of the left circumflex marginal system.  It also the newly occluded branch of the left circumflex.  He persistent patency of the left internal mammary artery to the LAD.  It was felt that the patient could be best managed medically.  Since his episode the patient appears to have chronic stable angina.  Interestingly on exertion however he has no chest pain but does have some shortness of breath.  On occasion he has central chest pain which will last a few minutes and resolves with sublingual nitroglycerin.  Unfortunately continues to chew tobacco but otherwise compliant with his medical regimen.  No Known Allergies  Current Outpatient Prescriptions on File Prior to Visit  Medication Sig Dispense Refill  . aspirin 325 MG tablet Take 325 mg by mouth daily.        Marland Kitchen atorvastatin (LIPITOR) 40 MG tablet Take 40 mg by mouth daily.        . chlorthalidone (HYGROTON) 50 MG tablet Take 50 mg by mouth daily.        . clopidogrel (PLAVIX) 75 MG tablet Take 75 mg by mouth daily.        . Fluticasone-Salmeterol (ADVAIR DISKUS) 250-50 MCG/DOSE AEPB Inhale 1 puff into the lungs 2 (two) times daily.        . isosorbide mononitrate (IMDUR) 120 MG 24 hr tablet Take 120 mg by mouth daily.        Marland Kitchen losartan (COZAAR) 100 MG tablet Take 100 mg by mouth daily.        . metFORMIN (GLUCOPHAGE) 500 MG tablet Take 500  mg by mouth daily.        . metoprolol (LOPRESSOR) 50 MG tablet Take 75 mg by mouth 2 (two) times daily.        . montelukast (SINGULAIR) 10 MG tablet Take 10 mg by mouth daily.        . nitroGLYCERIN (NITROSTAT) 0.4 MG SL tablet Place 0.4 mg under the tongue every 5 (five) minutes as needed.        Marland Kitchen omeprazole-sodium bicarbonate (ZEGERID) 40-1100 MG per capsule Take 1 capsule by mouth daily.        . verapamil (VERELAN PM) 120 MG 24 hr capsule Take 120 mg by mouth at bedtime.        Marland Kitchen DISCONTD: mometasone (NASONEX) 50 MCG/ACT nasal spray Place 2 sprays into the nose daily.          Past Medical History  Diagnosis Date  . CAD (coronary artery disease)     Status post non-ST elevation myocardial infarction January 2012 secondary to occluded branch of the circumflex coronary artery/medical treatment  . Myocardial infarction     s/p non-ST elevation MI and status-post 3 vessel coronary bypass grafting in 8/05.   . Presence of stent in left circumflex coronary artery     s/p Cypher stenting of the circumflex on Aug  2006  . Ejection fraction < 50%     normal EF  . HTN (hypertension)     poorly controlled  . DM2 (diabetes mellitus, type 2)   . Dyslipidemia   . ACE inhibitor intolerance   . Asthma   . Recurrent aspiration bronchitis/pneumonia     Hx    Past Surgical History  Procedure Date  . Coronary angioplasty with stent placement 2006  . Coronary artery bypass 2005     Family History  Problem Relation Age of Onset  . Stroke Other     family Hx- also DM and Neop prostate    History   Social History  . Marital Status: Married    Spouse Name: N/A    Number of Children: N/A  . Years of Education: N/A   Occupational History  . Not on file.   Social History Main Topics  . Smoking status: Former Smoker -- 0.3 packs/day for 30 years    Types: Cigarettes    Quit date: 07/15/1972  . Smokeless tobacco: Current User    Types: Chew   Comment: chews 1/2 pack tobacco per day    . Alcohol Use: No  . Drug Use: No  . Sexually Active: Not on file   Other Topics Concern  . Not on file   Social History Narrative   Married, retired.    YQM:VHQIONGEX positives as outlined above. The remainder of the 18  point review of systems is negative   PHYSICAL EXAM BP 131/79  Pulse 61  Ht 5\' 8"  (1.727 m)  Wt 192 lb (87.091 kg)  BMI 29.19 kg/m2  General: Well-developed, well-nourished in no distress Head: Normocephalic and atraumatic Eyes:PERRLA/EOMI intact, conjunctiva and lids normal Ears: No deformity or lesions Mouth:normal dentition, normal posterior pharynx Neck: Supple, no JVD.  No masses, thyromegaly or abnormal cervical nodes Lungs: Normal breath sounds bilaterally without wheezing.  Normal percussion Cardiac: regular rate and rhythm with normal S1 and S2, no S3 or S4.  PMI is normal.  No pathological murmurs Abdomen: Normal bowel sounds, abdomen is soft and nontender without masses, organomegaly or hernias noted.  No hepatosplenomegaly MSK: Back normal, normal gait muscle strength and tone normal Vascular: Pulse is normal in all 4 extremities Extremities: No peripheral pitting edema Neurologic: Alert and oriented x 3 Skin: Intact without lesions or rashes Lymphatics: No significant adenopathy Psychologic: Normal affect   ECG: Normal sinus rhythm otherwise normal tracing.  ASSESSMENT AND PLAN

## 2010-12-26 NOTE — Patient Instructions (Signed)
   Increase Imdur to 180mg  daily - can take 1 1/2 tabs of current supply.  Call office if tolerate & need new prescription.   Your physician wants you to follow up in: 6 months.  You will receive a reminder letter in the mail one-two months in advance.  If you don't receive a letter, please call our office to schedule the follow up appointment

## 2010-12-26 NOTE — Assessment & Plan Note (Signed)
Coronary artery disease status post non-ST elevation microinfarction status post coronary bypass grafting x3 on August 2005 Status post Cypher stent to the circumflex August 2006 Status post non-ST elevation myocardial infarction with new closure and of an OM branch of the circumflex January 2012: Medical treatment. Patient still has a little bit of residual chest pain. Will increase isosorbide mononitrate 180 mg by mouth daily.

## 2010-12-26 NOTE — Assessment & Plan Note (Signed)
The patient still chews tobacco. I counseled him about this habit.

## 2010-12-26 NOTE — Assessment & Plan Note (Signed)
Blood pressure controlled no further change in medical therapy.

## 2011-01-15 ENCOUNTER — Other Ambulatory Visit: Payer: Self-pay | Admitting: *Deleted

## 2011-01-15 MED ORDER — METOPROLOL TARTRATE 50 MG PO TABS: 75.0000 mg | ORAL_TABLET | Freq: Two times a day (BID) | ORAL | Status: DC

## 2011-01-15 MED ORDER — CHLORTHALIDONE 50 MG PO TABS: 50.0000 mg | ORAL_TABLET | Freq: Every day | ORAL | Status: DC

## 2011-01-27 ENCOUNTER — Inpatient Hospital Stay (HOSPITAL_COMMUNITY)
Admission: EM | Admit: 2011-01-27 | Discharge: 2011-02-03 | DRG: 176 | Disposition: A | Discharge status: Home / Self Care | Payer: Medicare Other | Attending: Cardiology | Admitting: Cardiology

## 2011-01-27 ENCOUNTER — Inpatient Hospital Stay (HOSPITAL_COMMUNITY): Payer: Medicare Other

## 2011-01-27 DIAGNOSIS — I2510 Atherosclerotic heart disease of native coronary artery without angina pectoris: Secondary | ICD-10-CM

## 2011-01-27 DIAGNOSIS — E119 Type 2 diabetes mellitus without complications: Secondary | ICD-10-CM | POA: Diagnosis present

## 2011-01-27 DIAGNOSIS — J449 Chronic obstructive pulmonary disease, unspecified: Secondary | ICD-10-CM | POA: Diagnosis present

## 2011-01-27 DIAGNOSIS — K0510 Chronic gingivitis, plaque induced: Secondary | ICD-10-CM | POA: Diagnosis present

## 2011-01-27 DIAGNOSIS — E785 Hyperlipidemia, unspecified: Secondary | ICD-10-CM | POA: Diagnosis present

## 2011-01-27 DIAGNOSIS — K219 Gastro-esophageal reflux disease without esophagitis: Secondary | ICD-10-CM | POA: Diagnosis present

## 2011-01-27 DIAGNOSIS — Z7982 Long term (current) use of aspirin: Secondary | ICD-10-CM

## 2011-01-27 DIAGNOSIS — I2699 Other pulmonary embolism without acute cor pulmonale: Principal | ICD-10-CM | POA: Diagnosis present

## 2011-01-27 DIAGNOSIS — Z7901 Long term (current) use of anticoagulants: Secondary | ICD-10-CM

## 2011-01-27 DIAGNOSIS — Z9861 Coronary angioplasty status: Secondary | ICD-10-CM

## 2011-01-27 DIAGNOSIS — F17200 Nicotine dependence, unspecified, uncomplicated: Secondary | ICD-10-CM | POA: Diagnosis present

## 2011-01-27 DIAGNOSIS — E7800 Pure hypercholesterolemia, unspecified: Secondary | ICD-10-CM | POA: Diagnosis present

## 2011-01-27 DIAGNOSIS — I252 Old myocardial infarction: Secondary | ICD-10-CM

## 2011-01-27 DIAGNOSIS — I25810 Atherosclerosis of coronary artery bypass graft(s) without angina pectoris: Secondary | ICD-10-CM | POA: Diagnosis present

## 2011-01-27 DIAGNOSIS — I10 Essential (primary) hypertension: Secondary | ICD-10-CM | POA: Diagnosis present

## 2011-01-27 DIAGNOSIS — J4489 Other specified chronic obstructive pulmonary disease: Secondary | ICD-10-CM | POA: Diagnosis present

## 2011-01-27 LAB — BASIC METABOLIC PANEL
CO2: 23 mEq/L (ref 19–32)
Chloride: 101 mEq/L (ref 96–112)
Creatinine, Ser: 1.27 mg/dL (ref 0.50–1.35)

## 2011-01-27 LAB — HEMOGLOBIN A1C: Mean Plasma Glucose: 137 mg/dL — ABNORMAL HIGH (ref <117)

## 2011-01-27 LAB — DIFFERENTIAL
Eosinophils Relative: 6 % — ABNORMAL HIGH (ref 0–5)
Lymphocytes Relative: 31 % (ref 12–46)
Lymphs Abs: 3.6 10*3/uL (ref 0.7–4.0)
Monocytes Absolute: 0.8 10*3/uL (ref 0.1–1.0)
Monocytes Relative: 6 % (ref 3–12)

## 2011-01-27 LAB — CBC
HCT: 41.4 % (ref 39.0–52.0)
MCH: 28.7 pg (ref 26.0–34.0)
MCHC: 33.6 g/dL (ref 30.0–36.0)
MCV: 85.4 fL (ref 78.0–100.0)
RDW: 14 % (ref 11.5–15.5)
WBC: 11.7 10*3/uL — ABNORMAL HIGH (ref 4.0–10.5)

## 2011-01-27 LAB — POCT I-STAT, CHEM 8
Chloride: 108 mEq/L (ref 96–112)
Creatinine, Ser: 1.3 mg/dL (ref 0.50–1.35)
Glucose, Bld: 115 mg/dL — ABNORMAL HIGH (ref 70–99)
Potassium: 3.7 mEq/L (ref 3.5–5.1)

## 2011-01-27 LAB — APTT: aPTT: 28 seconds (ref 24–37)

## 2011-01-27 LAB — CARDIAC PANEL(CRET KIN+CKTOT+MB+TROPI)
CK, MB: 3.5 ng/mL (ref 0.3–4.0)
Troponin I: <0.3 ng/mL (ref <0.30)

## 2011-01-27 LAB — D-DIMER, QUANTITATIVE: D-Dimer, Quant: 2.98 ug/mL-FEU — ABNORMAL HIGH (ref 0.00–0.48)

## 2011-01-28 ENCOUNTER — Inpatient Hospital Stay (HOSPITAL_COMMUNITY): Payer: Medicare Other

## 2011-01-28 ENCOUNTER — Encounter (HOSPITAL_COMMUNITY): Payer: Self-pay

## 2011-01-28 DIAGNOSIS — I2699 Other pulmonary embolism without acute cor pulmonale: Secondary | ICD-10-CM

## 2011-01-28 DIAGNOSIS — I517 Cardiomegaly: Secondary | ICD-10-CM

## 2011-01-28 LAB — CBC
HCT: 38.8 % — ABNORMAL LOW (ref 39.0–52.0)
Platelets: 238 10*3/uL (ref 150–400)
RBC: 4.49 MIL/uL (ref 4.22–5.81)
RDW: 14.3 % (ref 11.5–15.5)
WBC: 9.6 10*3/uL (ref 4.0–10.5)

## 2011-01-28 LAB — GLUCOSE, CAPILLARY
Glucose-Capillary: 114 mg/dL — ABNORMAL HIGH (ref 70–99)
Glucose-Capillary: 128 mg/dL — ABNORMAL HIGH (ref 70–99)

## 2011-01-28 LAB — BASIC METABOLIC PANEL
Calcium: 8.8 mg/dL (ref 8.4–10.5)
Chloride: 102 mEq/L (ref 96–112)
Creatinine, Ser: 1.22 mg/dL (ref 0.50–1.35)
GFR calc Af Amer: >60 mL/min (ref >60)
Sodium: 137 mEq/L (ref 135–145)

## 2011-01-28 LAB — HEPARIN LEVEL (UNFRACTIONATED): Heparin Unfractionated: 0.2 IU/mL — ABNORMAL LOW (ref 0.30–0.70)

## 2011-01-28 LAB — CARDIAC PANEL(CRET KIN+CKTOT+MB+TROPI)
CK, MB: 3.7 ng/mL (ref 0.3–4.0)
Relative Index: 2.9 — ABNORMAL HIGH (ref 0.0–2.5)
Troponin I: <0.3 ng/mL (ref <0.30)

## 2011-01-28 LAB — PLATELET INHIBITION P2Y12
P2Y12 % Inhibition: 33 %
Platelet Function Baseline: 309 [PRU] (ref 194–418)

## 2011-01-28 LAB — POCT ACTIVATED CLOTTING TIME: Activated Clotting Time: 149 seconds

## 2011-01-28 LAB — LIPID PANEL: Cholesterol: 129 mg/dL (ref 0–200)

## 2011-01-28 MED ORDER — IOHEXOL 300 MG/ML  SOLN
80.0000 mL | Freq: Once | INTRAMUSCULAR | Status: AC | PRN
  Administered 2011-01-28: 80 mL via INTRAVENOUS

## 2011-01-29 ENCOUNTER — Encounter: Payer: Self-pay | Admitting: *Deleted

## 2011-01-29 DIAGNOSIS — I2699 Other pulmonary embolism without acute cor pulmonale: Secondary | ICD-10-CM

## 2011-01-29 LAB — GLUCOSE, CAPILLARY
Glucose-Capillary: 134 mg/dL — ABNORMAL HIGH (ref 70–99)
Glucose-Capillary: 93 mg/dL (ref 70–99)
Glucose-Capillary: 126 mg/dL — ABNORMAL HIGH (ref 70–99)
Glucose-Capillary: 120 mg/dL — ABNORMAL HIGH (ref 70–99)
Glucose-Capillary: 121 mg/dL — ABNORMAL HIGH (ref 70–99)

## 2011-01-29 LAB — PROTIME-INR
INR: 1 (ref 0.00–1.49)
Prothrombin Time: 13.4 seconds (ref 11.6–15.2)

## 2011-01-29 LAB — BASIC METABOLIC PANEL
CO2: 25 mEq/L (ref 19–32)
Calcium: 9.2 mg/dL (ref 8.4–10.5)
Creatinine, Ser: 1.23 mg/dL (ref 0.50–1.35)

## 2011-01-29 LAB — CBC
Platelets: 245 10*3/uL (ref 150–400)
RDW: 14 % (ref 11.5–15.5)
WBC: 10.4 10*3/uL (ref 4.0–10.5)

## 2011-01-30 LAB — BASIC METABOLIC PANEL
BUN: 20 mg/dL (ref 6–23)
Creatinine, Ser: 1.27 mg/dL (ref 0.50–1.35)
GFR calc Af Amer: >60 mL/min (ref >60)
GFR calc non Af Amer: 56 mL/min — ABNORMAL LOW (ref >60)
Potassium: 3.6 mEq/L (ref 3.5–5.1)

## 2011-01-30 LAB — HEPARIN LEVEL (UNFRACTIONATED): Heparin Unfractionated: 0.41 IU/mL (ref 0.30–0.70)

## 2011-01-30 LAB — GLUCOSE, CAPILLARY

## 2011-01-30 LAB — PROTIME-INR
INR: 1.07 (ref 0.00–1.49)
Prothrombin Time: 14.1 seconds (ref 11.6–15.2)

## 2011-01-30 LAB — CBC
Hemoglobin: 13.4 g/dL (ref 13.0–17.0)
MCHC: 33.2 g/dL (ref 30.0–36.0)
WBC: 10.6 10*3/uL — ABNORMAL HIGH (ref 4.0–10.5)

## 2011-01-31 LAB — GLUCOSE, CAPILLARY
Glucose-Capillary: 105 mg/dL — ABNORMAL HIGH (ref 70–99)
Glucose-Capillary: 101 mg/dL — ABNORMAL HIGH (ref 70–99)
Glucose-Capillary: 118 mg/dL — ABNORMAL HIGH (ref 70–99)

## 2011-01-31 LAB — CBC
Hemoglobin: 13.2 g/dL (ref 13.0–17.0)
MCH: 29 pg (ref 26.0–34.0)
MCV: 85.5 fL (ref 78.0–100.0)
RBC: 4.55 MIL/uL (ref 4.22–5.81)

## 2011-01-31 NOTE — Cardiovascular Report (Signed)
Henry [name] NO.:  [id number]  MEDICAL RECORD NO.:  [id number]  LOCATION:  2915                         FACILITY:  [facility]  PHYSICIAN:  Henry Morton. Riley Kill, MD, FACCDATE OF BIRTH:  12/17/1940  DATE OF PROCEDURE:  01/27/2011 DATE OF DISCHARGE:                           CARDIAC CATHETERIZATION   INDICATIONS:  Henry [name] is well-known to [place].  He last underwent cath in January.  He had previous bypass surgery in 2006.  At that time, his internal mammary was patent but his vein grafts were occluded.  His distal circumflex is severely diseased and the obtuse marginal III was occluded.  The right is occluded, the vein graft is occluded that fills by collaterals from the proximal circumflex which is stented.  He presented today with prolonged chest pain.  Initial cardiac enzymes are pending, but the pain persisted and was similar to what he had in January.  As a result, we elected to bring him to the catheterization laboratory urgently.  He was originally called in as a STEMI, but there was no electrocardiographic evidence to suggest this.  PROCEDURE: 1. Left heart catheterization. 2. Selective coronary arteriography. 3. Saphenous vein graft angiography. 4. Selective left internal mammary angiography. 5. Selective left ventriculography.  DESCRIPTION OF PROCEDURE:  The procedure was performed from the right femoral artery.  The natives were injected followed by both vein grafts in the internal mammary, the IMA was injected using an IMA catheter. Central aortic and left ventricular pressure was measured with pigtail and ventriculography was performed in the RAO projection.  His ACT was adequate for sheath removal in the lab.  In comparing the films to the old films, there was not a substantial change and continued medical therapy was recommended.  I reviewed the films in detail with his family.  ANGIOGRAPHIC DATA: 1. On plain fluoroscopy, there was extensive  calcification of     coronaries. 2. The left main is intact. 3. The LAD has about 70% narrowing, is diffusely diseased and then is     totally occluded after the septal.  The septal has 80-90% proximal     narrowing.  The tiny diagonals with the third diagonal have some     ostial disease. 4. The internal mammary to distal left anterior descending artery     supplies the distal left anterior descending artery nicely.  There     are mild luminal irregularities down to the apex where there is     probably a focal eccentric stenosis prior to the bifurcation of     about 80% to the distal tip. 5. The circumflex has about 40% proximal narrowing, it does not appear     to be flow-limiting, followed by another 40% leading into a stented     area.  There is an A-V collateral that goes to the distal right     coronary.  The distal right coronary itself appears to be somewhat     diffusely diseased, particularly in the PDA distribution, but does     fill extensively by the collaterals.  The continuation of the     circumflex goes into a marginal.  There is an additional  marginal     which is occluded and fills by retrograde collaterals, small and     diffusely diseased.  The AV circumflex at this point goes down and     divides into a bifurcating branch distally.  The caliber of the     vessel down here is really quite small, segmentally and diffusely     diseased over a long segment, and no bigger than about 1.5 to 2 mm     in size. 6. The saphenous vein graft to the obtuse marginal is occluded. 7. The saphenous vein graft to the PDA is occluded. 8. The native RCA is occluded. 9. Ventriculography in the RAO projection reveals if anything only     mild hypokinesis of the apex but otherwise normal wall motion,     ejection fraction to be estimated at 50%.  CONCLUSION: 1. Patent internal mammary to the LAD. 2. Occluded saphenous vein grafts to the obtuse marginal and RCA. 3. Severe native  circumflex disease involves the very distal portion.     There is a 90% stenosis and very diffuse disease with very small-     caliber vessels.  As a result, we would recommend continued medical     therapy.  The distal targets are not ideal for either bypass or     stenting.  The circumflex itself has bifurcational disease, it is     very small in caliber.  The lesions are quite long and he is     diabetic.  A good angiographic result from stenting would be     unlikely on a long-term basis.  Hence continued medical therapy     would be recommended.     Henry Morton. Riley Kill, MD, [facility]     TDS/MEDQ  D:  01/27/2011  T:  01/28/2011  Job:  [id number]  cc:   CV Laboratory Learta Codding, MD,FACC  Electronically Signed by Shawnie Pons MD [facility] on 01/31/2011 07:29:46 AM

## 2011-02-01 LAB — PROTIME-INR
INR: 1.81 — ABNORMAL HIGH (ref 0.00–1.49)
Prothrombin Time: 21.3 seconds — ABNORMAL HIGH (ref 11.6–15.2)

## 2011-02-01 LAB — CBC
Platelets: 272 10*3/uL (ref 150–400)
RDW: 14 % (ref 11.5–15.5)
WBC: 8.8 10*3/uL (ref 4.0–10.5)

## 2011-02-02 LAB — CBC
HCT: 37.8 % — ABNORMAL LOW (ref 39.0–52.0)
MCHC: 33.6 g/dL (ref 30.0–36.0)
MCV: 85.7 fL (ref 78.0–100.0)
RDW: 14.1 % (ref 11.5–15.5)
WBC: 8.7 10*3/uL (ref 4.0–10.5)

## 2011-02-02 LAB — HEPARIN LEVEL (UNFRACTIONATED): Heparin Unfractionated: 0.32 IU/mL (ref 0.30–0.70)

## 2011-02-02 LAB — GLUCOSE, CAPILLARY: Glucose-Capillary: 107 mg/dL — ABNORMAL HIGH (ref 70–99)

## 2011-02-03 LAB — CBC
MCH: 29 pg (ref 26.0–34.0)
MCHC: 33.8 g/dL (ref 30.0–36.0)
MCV: 85.7 fL (ref 78.0–100.0)
Platelets: 257 10*3/uL (ref 150–400)
RDW: 14 % (ref 11.5–15.5)

## 2011-02-03 LAB — GLUCOSE, CAPILLARY: Glucose-Capillary: 109 mg/dL — ABNORMAL HIGH (ref 70–99)

## 2011-02-08 NOTE — Discharge Summary (Addendum)
NAMEJMICHAEL, Henry [name] NO.:  [id number]  MEDICAL RECORD NO.:  [id number]  LOCATION:  3713                         FACILITY:  [facility]  PHYSICIAN:  Duke Salvia, MD, FACCDATE OF BIRTH:  19-Oct-1940  DATE OF ADMISSION:  01/27/2011 DATE OF DISCHARGE:  02/03/2011                              DISCHARGE SUMMARY   DISCHARGE DIAGNOSES: 1. Acute bilateral pulmonary emboli by CTA on January 28, 2011. 2. Chest pain.     a.     Initially was a code STEMI that was canceled.     b.     Urgent catheterization on January 28, 2011, after cancellation      of code ST-elevation myocardial infarction, found that anatomy was      not ideal for bypass or PCI, for continued medical therapy. 3. Known coronary artery disease with NSTEMI in January 2012, with      no offer for PCI limited options for coronary artery bypass graft,      for medical therapy.  4. Chronic bronchitis. 5. History of syncope. 6. Non-insulin-dependent diabetes mellitus. 7. Hypertension. 8. Dyslipidemia. 9. Gastroesophageal reflux disease. 10.ACE inhibitor and tolerance with cough response.  HOSPITAL COURSE:  Henry [name] is a 70 year old gentleman with a history of known coronary artery disease as noted above, COPD, non-insulin- dependent diabetes who presented to the [facility] with complaints of chest discomfort.  His severe chest pain was similar to pain during the most recent MI, occurring in the early morning at rest. He took nitroglycerin without relief.  Code STEMI was called.  He arrived to the ER where Dr. Riley Kill reviewed his EKG until STEMI was called off.  He was taken to the Catheterization Lab later ongoing pain and unstable angina.  Please see full cath report for details, but ultimately, decision was made to treat the patient medically.  D-dimer was obtained, which was 2.98.  CT angio of the chest  showed acute bilateral pulmonary emboli.  Lower extremity Dopplers were negative.  A 2-D  echocardiogram was obtained showing normal RV function, and LVEF of 50%-55%.  He was noted to be hypertensive this admission.  His verapamil was increased for his blood pressure control.  Throughout the remainder of hospitalization, the patient remained on heparin to Coumadin overlap until he was therapeutic for greater than 48 hours and had the appropriate cross coverage with heparin.  Dr. Graciela Husbands also discussed with Dr. Riley Kill the possibility of stopping Plavix.  Dr. Riley Kill agreed with this plan.  Dr. Graciela Husbands seen and examined the patient today and feels he is stable for discharge.  DISCHARGE LABS:  WBC 9.7, hemoglobin 12.6, hematocrit 37.3, platelet count 257, INR 3.29.  D-dimer 2.98.  Cardiac enzymes negative x3.  STUDIES: 1. CT angio of the chest, January 27, 2011, showed acute pulmonary emboli     within bilateral lobar and segmental branches.  No evidence of RVR     or strain.  Cholelithiasis without CT evidence for cholecystitis.     Mild hiatal hernia.  Status post median sternotomy, and CABG. 2. Chest x-ray, January 27, 2011, showed nonspecific interstitial     markings with basilar predominance.  Does not  appear to be flash     pulmonary interstitial edema.  Consider viral/atypical pneumonia.     If PE remains a concern, CTA or V/Q scan would be necessary. 3. Cardiac catheterization, please see full report for details.  MEDICATIONS: 1. Verapamil 180 mg daily. 2. Coumadin 5 mg.  Per pharmacy, the patient is to take 1 tablet     tonight, 1-1/2 tablet tomorrow, on Tuesday, February 05, 2011, will     have his INR check. 3. Aspirin 81 mg daily. 4. Imdur 120 mg daily. 5. Advair Diskus 250/50 one puff inhaled b.i.d. 6. Tylenol 325 mg 2 tablets q.4 h. p.r.n. pain. 7. Cinnamon OTC 2 tablets daily. 8. Flonase nasal spray 2 sprays each nostril daily. 9. Fish oil 1000 mg daily. 10.Lipitor 80 mg daily. 11.Losartan 100 mg daily. 12.Metformin 500 mg at bedtime. 13.Metoprolol tartrate 50 mg  tablets t.i.d. 14.Nitroglycerin sublingual 0.4 mg every 5 minutes as needed up to 3     doses for chest pain. 15.Singulair 10 mg daily. 16.Zegerid 40 mg 1 tablet daily.  Per discussion between Dr. Graciela Husbands, Dr. Riley Kill, the patient's Plavix was discontinued this admission.  His verapamil was changed as above. Chlorthalidone was also discontinued this admission.  DISPOSITION:  Henry [name] will be discharged in stable condition to home. He is to keep his cath site clean and dry and to call or return for any problems at the site.  He should follow a low-sodium heart-healthy diabetic diet.  He will follow up with Dr. Riley Kill in approximately 2-4 weeks.  Dr. Margarita Mail office will also call him to help schedule his INR check for Tuesday, February 05, 2011.  I have left a message on the answering service's voicemail to do so.  The patient is aware that he should expect to call.  DURATION OF DISCHARGE ENCOUNTER:  Greater than 30 minutes including physician and PA time.     Ronie Spies, P.A.C.   ______________________________ Duke Salvia, MD, [facility]    DD/MEDQ  D:  02/03/2011  T:  02/04/2011  Job:  [id number]  cc:   Learta Codding, MD,FACC Delaney Meigs, M.D.  Electronically Signed by Ronie Spies  on 02/08/2011 06:00:32 PM Electronically Signed by Sherryl Manges MD [facility] on 03/11/2011 01:50:08 PM

## 2011-02-11 ENCOUNTER — Other Ambulatory Visit: Payer: Self-pay | Admitting: *Deleted

## 2011-02-11 MED ORDER — LOSARTAN POTASSIUM 100 MG PO TABS: 100.0000 mg | ORAL_TABLET | Freq: Every day | ORAL | Status: DC

## 2011-02-27 ENCOUNTER — Ambulatory Visit (INDEPENDENT_AMBULATORY_CARE_PROVIDER_SITE_OTHER): Payer: Medicare Other | Admitting: Cardiology

## 2011-02-27 ENCOUNTER — Encounter: Payer: Self-pay | Admitting: Cardiology

## 2011-02-27 DIAGNOSIS — I2510 Atherosclerotic heart disease of native coronary artery without angina pectoris: Secondary | ICD-10-CM

## 2011-02-27 DIAGNOSIS — I10 Essential (primary) hypertension: Secondary | ICD-10-CM

## 2011-02-27 DIAGNOSIS — R0602 Shortness of breath: Secondary | ICD-10-CM

## 2011-02-27 DIAGNOSIS — I2699 Other pulmonary embolism without acute cor pulmonale: Secondary | ICD-10-CM

## 2011-02-27 MED ORDER — VERAPAMIL HCL ER 240 MG PO TBCR: 240.0000 mg | EXTENDED_RELEASE_TABLET | Freq: Every day | ORAL | Status: DC

## 2011-02-27 MED ORDER — ISOSORBIDE MONONITRATE 120 MG PO TB24: ORAL_TABLET | ORAL | Status: DC

## 2011-02-27 NOTE — Progress Notes (Signed)
HPI  The patient is a 70 year old male with history of known coronary artery disease, COPD non-insulin-dependent diabetes mellitus who presented in July to [facility] hospital with chest discomfort. Initially a code STEMI was called, but this was canceled and the patient was taken off and later date to the catheterization laboratory. His anatomy was found not to be ideal for bypass O. intervention and continued medical therapy was recommended. D-dimer was elevated and a CT angiogram of the chest showed acute bilateral pulmonary emboli lower extremity Dopplers were negative. Echocardiogram showed normal RV function and ejection fraction 50-55%. The patient was taken off Plavix and started on Coumadin. The patient's Coumadin is managed by his primary care physician. It has remained high over the last several weeks but is improving. The patient stated he still gets substernal chest pain on exertion as well as symptoms of shortness of breath consistent with angina. His symptoms improved with sublingual nitroglycerin. Blood pressure has been poorly controlled. The patient was placed on the lower dose of verapamil upon hospital discharge. The patient now presents for followup he reports no groin complications.   No Known Allergies  Current Outpatient Prescriptions on File Prior to Visit  Medication Sig Dispense Refill  . atorvastatin (LIPITOR) 40 MG tablet Take 40 mg by mouth daily.        . fish oil-omega-3 fatty acids 1000 MG capsule Take 1 g by mouth daily.       . fluticasone (FLONASE) 50 MCG/ACT nasal spray Place 2 sprays into the nose daily.        . Fluticasone-Salmeterol (ADVAIR DISKUS) 250-50 MCG/DOSE AEPB Inhale 1 puff into the lungs 2 (two) times daily.        Marland Kitchen losartan (COZAAR) 100 MG tablet Take 1 tablet (100 mg total) by mouth daily.  30 tablet  6  . metoprolol (LOPRESSOR) 50 MG tablet Take 1.5 tablets (75 mg total) by mouth 2 (two) times daily.  90 tablet  6  . montelukast (SINGULAIR) 10 MG  tablet Take 10 mg by mouth daily.        . nitroGLYCERIN (NITROSTAT) 0.4 MG SL tablet Place 0.4 mg under the tongue every 5 (five) minutes as needed.        Marland Kitchen omeprazole-sodium bicarbonate (ZEGERID) 40-1100 MG per capsule Take 1 capsule by mouth daily.          Past Medical History  Diagnosis Date  . CAD (coronary artery disease)     Status post non-ST elevation myocardial infarction January 2012 secondary to occluded branch of the circumflex coronary artery/medical treatment  . Myocardial infarction     s/p non-ST elevation MI and status-post 3 vessel coronary bypass grafting in 8/05.   . Presence of stent in left circumflex coronary artery     s/p Cypher stenting of the circumflex on Aug 2006  . Ejection fraction < 50%     normal EF  . HTN (hypertension)     poorly controlled  . DM2 (diabetes mellitus, type 2)   . Dyslipidemia   . ACE inhibitor intolerance   . Asthma   . Recurrent aspiration bronchitis/pneumonia     Hx  . S/P CABG (coronary artery bypass graft) 2006    Past Surgical History  Procedure Date  . Coronary angioplasty with stent placement 2006  . Coronary artery bypass 2005     Family History  Problem Relation Age of Onset  . Stroke Other     family Hx- also DM and Neop prostate  History   Social History  . Marital Status: Married    Spouse Name: N/A    Number of Children: N/A  . Years of Education: N/A   Occupational History  . Not on file.   Social History Main Topics  . Smoking status: Former Smoker -- 0.3 packs/day for 30 years    Types: Cigarettes    Quit date: 07/15/1972  . Smokeless tobacco: Current User    Types: Chew   Comment: chews 1/2 pack tobacco per day  . Alcohol Use: No  . Drug Use: No  . Sexually Active: Not on file   Other Topics Concern  . Not on file   Social History Narrative   Married, retired.    ION:GEXBMWUXL positives as outlined above. The remainder of the 18  point review of systems is negative  PHYSICAL  EXAM BP 166/90  Pulse 79  Ht 5\' 8"  (1.727 m)  Wt 192 lb (87.091 kg)  BMI 29.19 kg/m2  SpO2 96%  General: Well-developed, well-nourished in no distress Head: Normocephalic and atraumatic Eyes:PERRLA/EOMI intact, conjunctiva and lids normal Ears: No deformity or lesions Mouth:normal dentition, normal posterior pharynx Neck: Supple, no JVD.  No masses, thyromegaly or abnormal cervical nodes Lungs: Normal breath sounds bilaterally without wheezing.  Normal percussion Cardiac: regular rate and rhythm with normal S1 and S2, no S3 or S4.  PMI is normal.  No pathological murmurs Abdomen: Normal bowel sounds, abdomen is soft and nontender without masses, organomegaly or hernias noted.  No hepatosplenomegaly MSK: Back normal, normal gait muscle strength and tone normal Vascular: Pulse is normal in all 4 extremities Extremities: No peripheral pitting edema Neurologic: Alert and oriented x 3 Skin: Intact without lesions or rashes Lymphatics: No significant adenopathy Psychologic: Normal affect   ECG: Not available  ASSESSMENT AND PLAN

## 2011-02-27 NOTE — Assessment & Plan Note (Signed)
CT scan of the chest to reevaluate for recurrent pulmonary emboli and decides in 6 months to stop Coumadin and resume Plavix.Henry [name]

## 2011-02-27 NOTE — Patient Instructions (Signed)
   Echo in 3 months just prior to next office visit  Increase Imdur to 180mg  daily  Increase Verapamil to 240mg  daily Follow up in  3 months

## 2011-02-27 NOTE — Assessment & Plan Note (Signed)
Increase verapamil.

## 2011-02-27 NOTE — Assessment & Plan Note (Signed)
Patient continues to have symptoms of angina. We will increase verapamil. Also increase isosorbide mononitrate. Consider adding Ranexa if needed. Status post recent catheterization with recommendation by Dr. Tedra Senegal to continue medical therapy. Plavix was discontinued and started on Coumadin because of pulmonary emboli.

## 2011-02-27 NOTE — Assessment & Plan Note (Signed)
Stable

## 2011-02-28 NOTE — H&P (Signed)
Henry [name], Henry [name] NO.:  [id number]  MEDICAL RECORD NO.:  [id number]  LOCATION:  2915                         FACILITY:  [facility]  PHYSICIAN:  Arturo Morton. Riley Kill, MD, FACCDATE OF BIRTH:  Dec 10, 1940  DATE OF ADMISSION:  01/27/2011 DATE OF DISCHARGE:                             HISTORY & PHYSICAL   PRIMARY CARDIOLOGIST:  Learta Codding, MD, [facility]  PRIMARY CARE PHYSICIAN:  Dr. Wende Crease.  REASON FOR ADMISSION:  Severe chest pain, unstable angina.  HISTORY OF PRESENT ILLNESS:  This is a 70 year old obese Caucasian male, patient of Dr. Andee Lineman in [place], with known history of severe CAD, hypertension, hypercholesterolemia, multiple cardiac catheterizations, ongoing smokeless tobacco use.  He presents to the emergency room with severe chest pain after calling EMS.  The pain is similar to the pain during the most recent MI, occurring early a.m. at rest.  He took nitroglycerin with no relief.  Wife called EMS, they gave him nitroglycerin en route which did help some with the pain.  A code STEMI was called.  He arrived to the emergency room where Dr. Riley Kill reviewed his EKG and code STEMI was called off.  However, he was taken to the cardiac catheterization lab secondary to ongoing chest pain and unstable angina.  REVIEW OF SYSTEMS:  Positive for severe chest pain, severe chest pressure, mild shortness of breath with diaphoresis.  All other systems have been reviewed and are found to be negative unless listed above.  PAST MEDICAL HISTORY: 1. Non-ST-elevated MI in January 2012.     a.     Left main normal, RCA 100% occlusion with retrograde fill      via collaterals, left circumflex long proximal stented section      with segment of 40%-50% stenosis, proximal portion of third obtuse      marginal is 100% occluded which is new since prior      catheterization, distal OM with bifurcation with segmental and      sequential 70%-80% stenosis diffusely, LAD extensive stents      proximally with diffuse in-stent restenosis and subsequent      occlusion, LIMA to LAD patent with good flow to distal LAD.  No      opportunity for PCI appreciated.  Limited option should be CABG to      distal left circumflex and distal RCA, but distal RCA is too      diseased for adjunctive revascularization, suggests medical      therapy.     b.     Status post recent office visit with Dr. Lewayne Bunting on December 26, 2010, revealing continued exertional residual chest      discomfort.  His isosorbide was increased to mononitrate 180 mg      daily.  He was advised to stop chewing tobacco. 2. Chronic bronchitis. 3. Syncope, vagal, moderated. 4. COPD with chronic bronchitis. 5. Non-insulin-dependent diabetes. 6. Hypertension. 7. Dyslipidemia. 8. GERD. 9. ANGIOTENSIN-CONVERTING ENZYME INHIBITOR intolerance with cough     response.  PAST SURGICAL HISTORY: 1. Coronary artery bypass grafting in August 2005.     a.     Status post LIMA to  LAD with endarterectomy, saphenous vein      graft to circumflex marginal branch, saphenous vein graft to      posterior descending coronary artery with endoscopic saphenous      vein harvest from bilateral thighs.     b.     Cypher drug-eluting stent to the circumflex in August 2006.  SOCIAL HISTORY:  He is married.  He lives in [place].  He continues smokeless tobacco.  He was a former smoker of one-third pack a day for 30 years.  Negative for EtOH or drug use.  FAMILY HISTORY:  Not available at time of this dictation.  CURRENT LABORATORY DATA:  Hemoglobin 13.9, hematocrit 41.4, white blood cells 11.7, platelets 251.  INR 1.0, PTT 28, PT 14.0.  Sodium 138, potassium 3.5, chloride 101, CO2 of 23, glucose 109, BUN 20, creatinine 1.27.  PHYSICAL EXAMINATION:  GENERAL:  He is awake, alert, oriented, in no acute distress. VITAL SIGNS:  Blood pressure 165/83, heart rate 65, respirations 24, O2 sat 99% on room air. HEENT:  Head is normocephalic  and atraumatic.  Eyes, PERRLA. NECK:  Supple without JVD or carotid bruits appreciated. CARDIOVASCULAR:  Regular rate and rhythm.  Distant heart sounds without murmurs, rubs, or gallops. LUNGS:  Essentially clear to auscultation with some bibasilar crackles noted. ABDOMEN:  Obese, nontender, 2+ bowel sounds. EXTREMITIES:  Without clubbing, cyanosis, or edema.  Radial pulses and distal pulses are palpable. SKIN:  Warm and dry. NEUROLOGIC:  Intact.  IMPRESSION: 1. Unstable angina.     a.     Known history of coronary artery disease status post      coronary artery bypass grafting and stents with multiple cardiac      catheterizations, most recent in January 2012. 2. Hypertension. 3. Diabetes. 4. Hypercholesterolemia. 5. Chronic obstructive pulmonary disease with chronic bronchitis.  PLAN:  The patient is taken emergently to cardiac catheterization lab. Although code STEMI had been discontinued, the patient was still having ongoing chest pain and stable angina and therefore, catheterization was completed per Dr. Riley Kill.  Please see Dr. Rosalyn Charters thorough cardiac catheterization notes for more details.  The patient will be admitted, placed in ICU with further recommendations per Dr. Riley Kill postcatheterization.     Bettey Mare. Lyman Bishop, NP   ______________________________ Arturo Morton Riley Kill, MD, [facility]    KML/MEDQ  D:  01/27/2011  T:  01/28/2011  Job:  [id number]  cc:   Dr. Wende Crease  Electronically Signed by Joni Reining NP on 01/31/2011 04:35:03 PM Electronically Signed by Shawnie Pons MD [facility] on 02/28/2011 09:49:32 AM

## 2011-04-19 LAB — POCT I-STAT GLUCOSE
Glucose, Bld: 111 mg/dL — ABNORMAL HIGH (ref 70–99)
Operator id: 141321

## 2011-05-22 ENCOUNTER — Other Ambulatory Visit: Payer: Medicare Other | Admitting: *Deleted

## 2011-05-23 ENCOUNTER — Other Ambulatory Visit: Payer: Medicare Other | Admitting: *Deleted

## 2011-05-30 ENCOUNTER — Ambulatory Visit: Payer: Medicare Other | Admitting: Cardiology

## 2011-07-23 ENCOUNTER — Ambulatory Visit (INDEPENDENT_AMBULATORY_CARE_PROVIDER_SITE_OTHER): Payer: Medicare Other | Admitting: Cardiology

## 2011-07-23 ENCOUNTER — Encounter: Payer: Self-pay | Admitting: Cardiology

## 2011-07-23 VITALS — BP 139/88 | HR 70 | Ht 68.0 in | Wt 189.0 lb

## 2011-07-23 DIAGNOSIS — I2510 Atherosclerotic heart disease of native coronary artery without angina pectoris: Secondary | ICD-10-CM

## 2011-07-23 MED ORDER — CHLORTHALIDONE 25 MG PO TABS: 25.0000 mg | ORAL_TABLET | Freq: Every day | ORAL | Status: DC

## 2011-07-23 MED ORDER — CLOPIDOGREL BISULFATE 75 MG PO TABS: 75.0000 mg | ORAL_TABLET | Freq: Every day | ORAL | Status: DC

## 2011-07-23 NOTE — Patient Instructions (Signed)
   Stop Coumadin  Restart Plavix 75mg  daily  Continue Aspirin 81mg  daily  Chlorthalidone 25mg  daily Your physician wants you to follow up in:  3 months.  You will receive a reminder letter in the mail one-two months in advance.  If you don't receive a letter, please call our office to schedule the follow up appointment

## 2011-07-24 ENCOUNTER — Encounter: Payer: Self-pay | Admitting: Cardiology

## 2011-07-24 NOTE — Progress Notes (Signed)
Henry Bottoms, MD, [facility] ABIM Board Certified in Adult Cardiovascular Medicine,Internal Medicine and Critical Care Medicine    EA:[id number] patient with coronary artery disease  HPI:  The patient is a 71 year old male with a history of coronary artery disease, status post prior coronary artery bypass grafting, prior stent placement, COPD and non-insulin-dependent diabetes mellitus.  The patient had a pulmonary embolism in July 2012. He presents for followup.  He has had some prostate cough and upper respiratory infection as well as bronchitis.  He finished recent therapy which Levaquin.  He has a history of chronic stable angina.  After we increased indoor and verapamil during his last office visit the patient has significantly improved.  He has rare exertional angina.  The patient, however, concerned about this "breathing" he continues to have cough.  He was concerned about a recent chest x-ray in December 2012 reviewed this with the patient and was only noted for basilar and lingular subsegmental atelectasis but otherwise stable. The patient reports no presyncope, syncope, orthopnea, PND.  The patient is questioning today if he can come off Coumadin.  He was scheduled for an echocardiogram, but was unable to afford this.  I told the patient we would do a limited bedside echocardiogram with the GE V-scan.  PMH: reviewed and listed in Problem List in Electronic Records (and see below) Past Medical History  Diagnosis Date  . CAD (coronary artery disease)     Status post non-ST elevation myocardial infarction January 2012 secondary to occluded branch of the circumflex coronary artery/medical treatment  . Myocardial infarction     s/p non-ST elevation MI and status-post 3 vessel coronary bypass grafting in 8/05.   . Presence of stent in left circumflex coronary artery     s/p Cypher stenting of the circumflex on Aug 2006  . Ejection fraction < 50%     normal EF  . HTN (hypertension)     poorly  controlled  . DM2 (diabetes mellitus, type 2)   . Dyslipidemia   . ACE inhibitor intolerance   . Asthma   . Recurrent aspiration bronchitis/pneumonia     Hx  . S/P CABG (coronary artery bypass graft) 2006  . Pulmonary embolism     July 2012 place on Coumadin   Past Surgical History  Procedure Date  . Coronary angioplasty with stent placement 2006  . Coronary artery bypass 2005     Allergies/SH/FHX : available in Electronic Records for review  No Known Allergies History   Social History  . Marital Status: Married    Spouse Name: N/A    Number of Children: N/A  . Years of Education: N/A   Occupational History  . Not on file.   Social History Main Topics  . Smoking status: Former Smoker -- 0.3 packs/day for 30 years    Types: Cigarettes    Quit date: 07/15/1972  . Smokeless tobacco: Current User    Types: Chew   Comment: chews 1/2 pack tobacco per day  . Alcohol Use: No  . Drug Use: No  . Sexually Active: Not on file   Other Topics Concern  . Not on file   Social History Narrative   Married, retired.    Family History  Problem Relation Age of Onset  . Stroke Other     family Hx- also DM and Neop prostate    Medications: Current Outpatient Prescriptions  Medication Sig Dispense Refill  . acetaminophen (TYLENOL) 325 MG tablet Take 650 mg by mouth every  4 (four) hours as needed.        Marland Kitchen aspirin 81 MG EC tablet Take 81 mg by mouth daily.        . Cinnamon 500 MG TABS Take 2 tablets by mouth daily.        . Coenzyme Q10 (COQ-10) 150 MG CAPS Take 1 capsule by mouth daily.        . fish oil-omega-3 fatty acids 1000 MG capsule Take 2 g by mouth daily.       . fluticasone (FLONASE) 50 MCG/ACT nasal spray Place 2 sprays into the nose daily.        . Fluticasone-Salmeterol (ADVAIR DISKUS) 250-50 MCG/DOSE AEPB Inhale 1 puff into the lungs 2 (two) times daily.        . isosorbide mononitrate (IMDUR) 120 MG 24 hr tablet Take 1 1/2 tabs (180mg ) daily  45 tablet  6  .  losartan (COZAAR) 100 MG tablet Take 1 tablet (100 mg total) by mouth daily.  30 tablet  6  . metFORMIN (GLUCOPHAGE-XR) 500 MG 24 hr tablet Take 500 mg by mouth daily with breakfast.        . metoprolol (LOPRESSOR) 50 MG tablet Take 1.5 tablets (75 mg total) by mouth 2 (two) times daily.  90 tablet  6  . montelukast (SINGULAIR) 10 MG tablet Take 10 mg by mouth daily.        . nitroGLYCERIN (NITROSTAT) 0.4 MG SL tablet Place 0.4 mg under the tongue every 5 (five) minutes as needed.        Marland Kitchen omeprazole-sodium bicarbonate (ZEGERID) 40-1100 MG per capsule Take 1 capsule by mouth daily.        . Pitavastatin Calcium (LIVALO) 4 MG TABS Take 1 tablet by mouth daily.        . verapamil (CALAN-SR) 240 MG CR tablet Take 1 tablet (240 mg total) by mouth daily.  30 tablet  6  . chlorthalidone (HYGROTON) 25 MG tablet Take 1 tablet (25 mg total) by mouth daily.  30 tablet  6  . clopidogrel (PLAVIX) 75 MG tablet Take 1 tablet (75 mg total) by mouth daily.  30 tablet  6    ROS: No nausea or vomiting. No fever or chills.No melena or hematochezia.No bleeding.No claudication.  Cough stable, angina  Physical Exam: BP 139/88  Pulse 70  Ht 5\' 8"  (1.727 m)  Wt 189 lb (85.73 kg)  BMI 28.74 kg/m2 General:well-nourished white male in no distress with occasional cough Neck:normal carotid upstroke and no carotid bruits.  No thyromegaly.  Nonnodular thyroid Lungs:clear breath sounds bilaterally.  No wheezing Cardiac:regular rate and rhythm with normal S1 and S2 no murmur, rubs or gallops Vascular:no edema.  Normal distal pulses Skin:warm and dry Physcologic:normal affect  12lead ECG:not performed Limited bedside ECHO:normal LV systolic function, ejection fraction 60%, no significant aortic mitral valve pathology.  Normal RV function   Patient Active Problem List  Diagnoses  . DM  . HYPERTENSION-poorly controlled  . CORONARY ATHEROSCLEROSIS NATIVE CORONARY ARTERY-status post coronary bypass grafting  .  CHRONIC OBSTRUCTIVE ASTHMA UNSPECIFIED  . ESOPHAGEAL REFLUX  . SYNCOPEno recurrence  . SHORTNESS OF BREATH  . COUGH  . PRECORDIAL PAIN-chronic stable angina  . TOBACCO ABUSE, HX OF  . POSTSURGICAL AORTOCORONARY BYPASS STATUS-ejection fraction 60%  . POSTSURG PERCUT TRANSLUMINAL COR ANGPLSTY STS  . Recurrent aspiration bronchitis/pneumonia  . Pulmonary embolismstatus post Coumadin therapy for 6 months    PLAN   Prescribed chlorthalidone 25 milligrams by mouth daily  for hypertension  Patient has been treated for greater than 6 months for incidentally found small pulmonary emboli.  We'll discontinue Coumadin  Stable from an angina perspective with improvement of exertional angina after increase of Imdur and verapamil  Continue risk factor modification.  Resume Plavix 75 mg by mouth daily.  Routine followup in 6 months.

## 2011-09-19 ENCOUNTER — Other Ambulatory Visit: Payer: Self-pay | Admitting: Cardiology

## 2011-09-24 ENCOUNTER — Other Ambulatory Visit: Payer: Self-pay | Admitting: Cardiology

## 2011-10-08 ENCOUNTER — Other Ambulatory Visit: Payer: Self-pay | Admitting: *Deleted

## 2011-10-08 NOTE — Telephone Encounter (Signed)
Request came from [facility] and nurse called patient to clarify chlorthalidone dose since we had 25 mg and request was for 50 mg. Patient informed nurse that he was taking 25 mg. Patient told nurse that he didn't request any refills and he has informed [facility] that he would contact them directly if he decides to change to 3 mth supply prescriptions. Patient told nurse that he didn't want these refills at this time. All refills denied and pharmacy notified via fax.

## 2011-10-24 ENCOUNTER — Other Ambulatory Visit: Payer: Self-pay | Admitting: Cardiology

## 2011-11-05 ENCOUNTER — Ambulatory Visit (INDEPENDENT_AMBULATORY_CARE_PROVIDER_SITE_OTHER): Payer: Medicare Other | Admitting: Cardiology

## 2011-11-05 ENCOUNTER — Encounter: Payer: Self-pay | Admitting: Cardiology

## 2011-11-05 VITALS — BP 158/89 | HR 78 | Ht 68.0 in | Wt 194.0 lb

## 2011-11-05 DIAGNOSIS — I10 Essential (primary) hypertension: Secondary | ICD-10-CM

## 2011-11-05 DIAGNOSIS — I2699 Other pulmonary embolism without acute cor pulmonale: Secondary | ICD-10-CM

## 2011-11-05 DIAGNOSIS — Z9861 Coronary angioplasty status: Secondary | ICD-10-CM

## 2011-11-05 DIAGNOSIS — M722 Plantar fascial fibromatosis: Secondary | ICD-10-CM

## 2011-11-05 DIAGNOSIS — J449 Chronic obstructive pulmonary disease, unspecified: Secondary | ICD-10-CM

## 2011-11-05 NOTE — Patient Instructions (Signed)
Continue all current medications. Keep log of blood pressure readings and bring to office Okay to take Ibuprofen as needed for plantar fasciatus Your physician wants you to follow up in: 6 months.  You will receive a reminder letter in the mail one-two months in advance.  If you don't receive a letter, please call our office to schedule the follow up appointment

## 2011-11-17 ENCOUNTER — Encounter: Payer: Self-pay | Admitting: Cardiology

## 2011-11-17 DIAGNOSIS — M722 Plantar fascial fibromatosis: Secondary | ICD-10-CM | POA: Insufficient documentation

## 2011-11-17 NOTE — Assessment & Plan Note (Signed)
Continue DAPT now the patient is off Coumadin.

## 2011-11-17 NOTE — Assessment & Plan Note (Signed)
Chronic shortness of breath related to COPD.

## 2011-11-17 NOTE — Assessment & Plan Note (Signed)
I told patient that it's okay in the short term to take ibuprofen that he has to be careful because his own DAPT.  I recommended that he go see a foot doctor so he can be treated hopefully without medications.

## 2011-11-17 NOTE — Assessment & Plan Note (Signed)
Status post treatment for 6 months.  Patient currently off Coumadin.  The patient will continue on DAPT.

## 2011-11-17 NOTE — Progress Notes (Signed)
Henry Bottoms, MD, [facility] ABIM Board Certified in Adult Cardiovascular Medicine,Internal Medicine and Critical Care Medicine    CC: Followup patient with coronary artery disease and prior bypass grafting.  HPI:  Patient has discontinued Coumadin and is being adequately treated for pulmonary embolism.  He has resumed DAPT.  He has no specific cardiovascular complaints today.  His blood pressure is elevated however in the office.  He does feel he has some increased shortness of breath associated with cough but relates this to asthma/COPD exacerbated by pollen.  The patient reports no symptoms of angina and otherwise has normal exercise tolerance.  He does complain of foot pain consistent with plantar fasciitis and is being taken occasionally ibuprofen. From a cardiovascular standpoint he reports no other issues like presyncope or syncope or palpitations.  PMH: reviewed and listed in Problem List in Electronic Records (and see below) Past Medical History  Diagnosis Date  . CAD (coronary artery disease)     Status post non-ST elevation myocardial infarction January 2012 secondary to occluded branch of the circumflex coronary artery/medical treatment  . Myocardial infarction     s/p non-ST elevation MI and status-post 3 vessel coronary bypass grafting in 8/05.   . Presence of stent in left circumflex coronary artery     s/p Cypher stenting of the circumflex on Aug 2006  . Ejection fraction < 50%     normal EF, Ejection fraction 60% by bedside echocardiogram January 2013.  Marland Kitchen HTN (hypertension)     poorly controlled  . DM2 (diabetes mellitus, type 2)   . Dyslipidemia   . ACE inhibitor intolerance   . Asthma   . Recurrent aspiration bronchitis/pneumonia     Hx  . S/P CABG (coronary artery bypass graft) 2006  . Pulmonary embolism     July 2012 place on Coumadin   Past Surgical History  Procedure Date  . Coronary angioplasty with stent placement 2006  . Coronary artery bypass 2005      Allergies/SH/FHX : available in Electronic Records for review  No Known Allergies History   Social History  . Marital Status: Married    Spouse Name: N/A    Number of Children: N/A  . Years of Education: N/A   Occupational History  . Not on file.   Social History Main Topics  . Smoking status: Former Smoker -- 0.3 packs/day for 30 years    Types: Cigarettes    Quit date: 07/15/1972  . Smokeless tobacco: Current User    Types: Chew   Comment: chews 1/2 pack tobacco per day  . Alcohol Use: No  . Drug Use: No  . Sexually Active: Not on file   Other Topics Concern  . Not on file   Social History Narrative   Married, retired.    Family History  Problem Relation Age of Onset  . Stroke Other     family Hx- also DM and Neop prostate    Medications: Current Outpatient Prescriptions  Medication Sig Dispense Refill  . acetaminophen (TYLENOL) 325 MG tablet Take 650 mg by mouth every 4 (four) hours as needed.        Marland Kitchen aspirin 81 MG EC tablet Take 81 mg by mouth daily.        . chlorthalidone (HYGROTON) 25 MG tablet Take 1 tablet (25 mg total) by mouth daily.  30 tablet  6  . Cinnamon 500 MG TABS Take 2 tablets by mouth daily.        . clopidogrel (  PLAVIX) 75 MG tablet Take 1 tablet (75 mg total) by mouth daily.  30 tablet  6  . Coenzyme Q10 (COQ-10) 150 MG CAPS Take 1 capsule by mouth daily.        . fish oil-omega-3 fatty acids 1000 MG capsule Take 2 g by mouth daily.       . fluticasone (FLONASE) 50 MCG/ACT nasal spray Place 2 sprays into the nose daily.        . Fluticasone-Salmeterol (ADVAIR DISKUS) 250-50 MCG/DOSE AEPB Inhale 1 puff into the lungs 2 (two) times daily.        . isosorbide mononitrate (IMDUR) 60 MG 24 hr tablet TAKE 3 TABLETS BY MOUTH DAILY  90 tablet  6  . losartan (COZAAR) 100 MG tablet Take 1 tablet (100 mg total) by mouth daily.  30 tablet  6  . metFORMIN (GLUCOPHAGE-XR) 500 MG 24 hr tablet Take 500 mg by mouth daily with breakfast.        .  metoprolol (LOPRESSOR) 50 MG tablet Take 1.5 tablets (75 mg total) by mouth 2 (two) times daily.  90 tablet  6  . montelukast (SINGULAIR) 10 MG tablet Take 10 mg by mouth daily.        . nitroGLYCERIN (NITROSTAT) 0.4 MG SL tablet Place 0.4 mg under the tongue every 5 (five) minutes as needed.        Marland Kitchen omeprazole-sodium bicarbonate (ZEGERID) 40-1100 MG per capsule Take 1 capsule by mouth daily.        . Pitavastatin Calcium (LIVALO) 4 MG TABS Take 1 tablet by mouth daily.        . verapamil (CALAN-SR) 240 MG CR tablet TAKE 1 TABLET (240 MG TOTAL) BY MOUTH DAILY.  30 tablet  6    ROS: No nausea or vomiting. No fever or chills.No melena or hematochezia.No bleeding.No claudication  Physical Exam: BP 158/89  Pulse 78  Ht 5\' 8"  (1.727 m)  Wt 194 lb (87.998 kg)  BMI 29.50 kg/m2 General:Well-nourished white male in no apparent distress. Neck:Normal carotid upstroke and no carotid bruits.  No thyromegaly no nodular thyroid.  JVP is 5-6 cm Lungs:Clear breath sounds bilaterally.  No definite wheezing on exam today Cardiac: regular rate and rhythm with normal S1 and S2No murmur rubs or gallops Vascular:No edema.  Normal distal pulses. Skin:Warm and dry. Physcologic:Normal affect.  12lead ECG:Not obtained Limited bedside ECHO:N/A No images are attached to the encounter.   Assessment and Plan  Pulmonary embolism Status post treatment for 6 months.  Patient currently off Coumadin.  The patient will continue on DAPT.  POSTSURG PERCUT TRANSLUMINAL COR ANGPLSTY STS Continue DAPT now the patient is off Coumadin.  Plantar fasciitis I told patient that it's okay in the short term to take ibuprofen that he has to be careful because his own DAPT.  I recommended that he go see a foot doctor so he can be treated hopefully without medications.  HYPERTENSION Blood pressure elevated in the office.  I asked the patient keep a log of His blood pressure readings.  We will make decisions whether he needs  additional antihypertensive therapy based on the readings.  CHRONIC OBSTRUCTIVE ASTHMA UNSPECIFIED Chronic shortness of breath related to COPD.    Patient Active Problem List  Diagnoses  . DM  . HYPERTENSION  . OLD MYOCARDIAL INFARCTION  . CHRONIC OBSTRUCTIVE ASTHMA UNSPECIFIED  . ESOPHAGEAL REFLUX  . SHORTNESS OF BREATH  . COUGH  . TOBACCO ABUSE, HX OF  . POSTSURGICAL AORTOCORONARY BYPASS  STATUS  . POSTSURG PERCUT TRANSLUMINAL COR ANGPLSTY STS  . Recurrent aspiration bronchitis/pneumonia  . Pulmonary embolism  . Plantar fasciitis

## 2011-11-17 NOTE — Assessment & Plan Note (Signed)
Blood pressure elevated in the office.  I asked the patient keep a log of His blood pressure readings.  We will make decisions whether he needs additional antihypertensive therapy based on the readings.

## 2011-11-18 ENCOUNTER — Other Ambulatory Visit: Payer: Self-pay | Admitting: Cardiology

## 2011-12-08 ENCOUNTER — Emergency Department (HOSPITAL_COMMUNITY): Payer: Medicare Other

## 2011-12-08 ENCOUNTER — Inpatient Hospital Stay (HOSPITAL_COMMUNITY)
Admission: EM | Admit: 2011-12-08 | Discharge: 2011-12-14 | DRG: 176 | Disposition: A | Discharge status: Home / Self Care | Payer: Medicare Other | Attending: Cardiology | Admitting: Cardiology

## 2011-12-08 ENCOUNTER — Encounter (HOSPITAL_COMMUNITY): Payer: Self-pay | Admitting: *Deleted

## 2011-12-08 DIAGNOSIS — Z833 Family history of diabetes mellitus: Secondary | ICD-10-CM

## 2011-12-08 DIAGNOSIS — I2699 Other pulmonary embolism without acute cor pulmonale: Principal | ICD-10-CM

## 2011-12-08 DIAGNOSIS — R748 Abnormal levels of other serum enzymes: Secondary | ICD-10-CM | POA: Diagnosis present

## 2011-12-08 DIAGNOSIS — J449 Chronic obstructive pulmonary disease, unspecified: Secondary | ICD-10-CM | POA: Diagnosis present

## 2011-12-08 DIAGNOSIS — Z823 Family history of stroke: Secondary | ICD-10-CM

## 2011-12-08 DIAGNOSIS — Z951 Presence of aortocoronary bypass graft: Secondary | ICD-10-CM

## 2011-12-08 DIAGNOSIS — R0600 Dyspnea, unspecified: Secondary | ICD-10-CM

## 2011-12-08 DIAGNOSIS — I2510 Atherosclerotic heart disease of native coronary artery without angina pectoris: Secondary | ICD-10-CM | POA: Diagnosis present

## 2011-12-08 DIAGNOSIS — J4489 Other specified chronic obstructive pulmonary disease: Secondary | ICD-10-CM | POA: Diagnosis present

## 2011-12-08 DIAGNOSIS — R0609 Other forms of dyspnea: Secondary | ICD-10-CM

## 2011-12-08 DIAGNOSIS — I10 Essential (primary) hypertension: Secondary | ICD-10-CM | POA: Insufficient documentation

## 2011-12-08 DIAGNOSIS — Z87891 Personal history of nicotine dependence: Secondary | ICD-10-CM

## 2011-12-08 DIAGNOSIS — Z9861 Coronary angioplasty status: Secondary | ICD-10-CM

## 2011-12-08 DIAGNOSIS — E785 Hyperlipidemia, unspecified: Secondary | ICD-10-CM

## 2011-12-08 DIAGNOSIS — Z79899 Other long term (current) drug therapy: Secondary | ICD-10-CM

## 2011-12-08 DIAGNOSIS — E119 Type 2 diabetes mellitus without complications: Secondary | ICD-10-CM | POA: Insufficient documentation

## 2011-12-08 DIAGNOSIS — I252 Old myocardial infarction: Secondary | ICD-10-CM

## 2011-12-08 DIAGNOSIS — R000 Tachycardia, unspecified: Secondary | ICD-10-CM

## 2011-12-08 DIAGNOSIS — R894 Abnormal immunological findings in specimens from other organs, systems and tissues: Secondary | ICD-10-CM | POA: Diagnosis present

## 2011-12-08 DIAGNOSIS — I2789 Other specified pulmonary heart diseases: Secondary | ICD-10-CM | POA: Diagnosis present

## 2011-12-08 DIAGNOSIS — R7989 Other specified abnormal findings of blood chemistry: Secondary | ICD-10-CM

## 2011-12-08 DIAGNOSIS — I517 Cardiomegaly: Secondary | ICD-10-CM

## 2011-12-08 DIAGNOSIS — Z7902 Long term (current) use of antithrombotics/antiplatelets: Secondary | ICD-10-CM

## 2011-12-08 DIAGNOSIS — Z7901 Long term (current) use of anticoagulants: Secondary | ICD-10-CM

## 2011-12-08 LAB — CBC
HCT: 44.5 % (ref 39.0–52.0)
Hemoglobin: 15.1 g/dL (ref 13.0–17.0)
MCHC: 33.9 g/dL (ref 30.0–36.0)
RBC: 5.29 MIL/uL (ref 4.22–5.81)

## 2011-12-08 LAB — POCT I-STAT, CHEM 8
Calcium, Ion: 1.17 mmol/L (ref 1.12–1.32)
Creatinine, Ser: 1.1 mg/dL (ref 0.50–1.35)
Glucose, Bld: 145 mg/dL — ABNORMAL HIGH (ref 70–99)
HCT: 49 % (ref 39.0–52.0)
Hemoglobin: 16.7 g/dL (ref 13.0–17.0)
TCO2: 22 mmol/L (ref 0–100)

## 2011-12-08 LAB — COMPREHENSIVE METABOLIC PANEL
Alkaline Phosphatase: 51 U/L (ref 39–117)
BUN: 19 mg/dL (ref 6–23)
CO2: 22 mEq/L (ref 19–32)
Chloride: 101 mEq/L (ref 96–112)
GFR calc Af Amer: 78 mL/min — ABNORMAL LOW (ref >90)
GFR calc non Af Amer: 67 mL/min — ABNORMAL LOW (ref >90)
Glucose, Bld: 140 mg/dL — ABNORMAL HIGH (ref 70–99)
Potassium: 3.9 mEq/L (ref 3.5–5.1)
Total Bilirubin: 0.4 mg/dL (ref 0.3–1.2)
Total Protein: 8 g/dL (ref 6.0–8.3)

## 2011-12-08 LAB — CARDIAC PANEL(CRET KIN+CKTOT+MB+TROPI)
CK, MB: 5.6 ng/mL — ABNORMAL HIGH (ref 0.3–4.0)
CK, MB: 5.4 ng/mL — ABNORMAL HIGH (ref 0.3–4.0)
Relative Index: 4.3 — ABNORMAL HIGH (ref 0.0–2.5)
Relative Index: 4 — ABNORMAL HIGH (ref 0.0–2.5)
Total CK: 135 U/L (ref 7–232)
Troponin I: 0.51 ng/mL (ref <0.30)
Troponin I: <0.3 ng/mL (ref <0.30)

## 2011-12-08 LAB — HEMOGLOBIN A1C: Mean Plasma Glucose: 146 mg/dL — ABNORMAL HIGH (ref <117)

## 2011-12-08 LAB — PROTIME-INR
Prothrombin Time: 13.5 seconds (ref 11.6–15.2)
Prothrombin Time: 14.4 seconds (ref 11.6–15.2)

## 2011-12-08 LAB — GLUCOSE, CAPILLARY
Glucose-Capillary: 127 mg/dL — ABNORMAL HIGH (ref 70–99)
Glucose-Capillary: 144 mg/dL — ABNORMAL HIGH (ref 70–99)

## 2011-12-08 LAB — ANTITHROMBIN III: AntiThromb III Func: 88 % (ref 75–120)

## 2011-12-08 LAB — APTT: aPTT: 49 seconds — ABNORMAL HIGH (ref 24–37)

## 2011-12-08 LAB — PRO B NATRIURETIC PEPTIDE: Pro B Natriuretic peptide (BNP): 5628 pg/mL — ABNORMAL HIGH (ref 0–125)

## 2011-12-08 MED ORDER — MORPHINE SULFATE 4 MG/ML IJ SOLN
4.0000 mg | Freq: Once | INTRAMUSCULAR | Status: AC
  Administered 2011-12-08: 4 mg via INTRAVENOUS
  Filled 2011-12-08: qty 1

## 2011-12-08 MED ORDER — FLUTICASONE PROPIONATE 50 MCG/ACT NA SUSP
1.0000 | Freq: Every day | NASAL | Status: DC
  Administered 2011-12-09 – 2011-12-14 (×6): 1 via NASAL
  Filled 2011-12-08 (×2): qty 16

## 2011-12-08 MED ORDER — CLOPIDOGREL BISULFATE 75 MG PO TABS: 75.0000 mg | ORAL_TABLET | Freq: Every day | ORAL | Status: DC

## 2011-12-08 MED ORDER — HEPARIN (PORCINE) IN NACL 100-0.45 UNIT/ML-% IJ SOLN
1350.0000 [IU]/h | INTRAMUSCULAR | Status: DC
  Administered 2011-12-08: 1000 [IU]/h via INTRAVENOUS
  Administered 2011-12-08 – 2011-12-12 (×5): 1600 [IU]/h via INTRAVENOUS
  Administered 2011-12-13: 1350 [IU]/h via INTRAVENOUS
  Filled 2011-12-08 (×14): qty 250

## 2011-12-08 MED ORDER — METOPROLOL TARTRATE 50 MG PO TABS
75.0000 mg | ORAL_TABLET | Freq: Two times a day (BID) | ORAL | Status: DC
  Administered 2011-12-08 – 2011-12-14 (×13): 75 mg via ORAL
  Filled 2011-12-08 (×14): qty 1

## 2011-12-08 MED ORDER — MUPIROCIN 2 % EX OINT
1.0000 "application " | TOPICAL_OINTMENT | Freq: Two times a day (BID) | CUTANEOUS | Status: AC
  Administered 2011-12-08 – 2011-12-13 (×10): 1 via NASAL
  Filled 2011-12-08 (×2): qty 22

## 2011-12-08 MED ORDER — IOHEXOL 350 MG/ML SOLN
100.0000 mL | Freq: Once | INTRAVENOUS | Status: AC | PRN
  Administered 2011-12-08: 100 mL via INTRAVENOUS

## 2011-12-08 MED ORDER — CHLORHEXIDINE GLUCONATE CLOTH 2 % EX PADS
6.0000 | MEDICATED_PAD | Freq: Every morning | CUTANEOUS | Status: AC
  Administered 2011-12-09 – 2011-12-13 (×5): 6 via TOPICAL

## 2011-12-08 MED ORDER — SODIUM CHLORIDE 0.9 % IV SOLN
INTRAVENOUS | Status: DC
  Administered 2011-12-08: 09:00:00 via INTRAVENOUS
  Administered 2011-12-12: 10 mL/h via INTRAVENOUS

## 2011-12-08 MED ORDER — NITROGLYCERIN IN D5W 200-5 MCG/ML-% IV SOLN
2.0000 ug/min | Freq: Once | INTRAVENOUS | Status: AC
  Administered 2011-12-08: 5 ug/min via INTRAVENOUS
  Filled 2011-12-08: qty 250

## 2011-12-08 MED ORDER — NITROGLYCERIN 0.4 MG SL SUBL: 0.4000 mg | SUBLINGUAL_TABLET | SUBLINGUAL | Status: DC | PRN

## 2011-12-08 MED ORDER — HEPARIN BOLUS VIA INFUSION
4000.0000 [IU] | Freq: Once | INTRAVENOUS | Status: AC
  Administered 2011-12-08: 4000 [IU] via INTRAVENOUS

## 2011-12-08 MED ORDER — CHLORHEXIDINE GLUCONATE CLOTH 2 % EX PADS: 6.0000 | MEDICATED_PAD | Freq: Every day | CUTANEOUS | Status: DC

## 2011-12-08 MED ORDER — ACETAMINOPHEN 325 MG PO TABS
650.0000 mg | ORAL_TABLET | ORAL | Status: DC | PRN
  Administered 2011-12-08 – 2011-12-10 (×3): 650 mg via ORAL
  Filled 2011-12-08 (×3): qty 2

## 2011-12-08 MED ORDER — ASPIRIN 300 MG RE SUPP: 300.0000 mg | RECTAL | Status: AC

## 2011-12-08 MED ORDER — FLUTICASONE-SALMETEROL 250-50 MCG/DOSE IN AEPB
1.0000 | INHALATION_SPRAY | Freq: Two times a day (BID) | RESPIRATORY_TRACT | Status: DC
  Administered 2011-12-08 – 2011-12-14 (×12): 1 via RESPIRATORY_TRACT
  Filled 2011-12-08: qty 14

## 2011-12-08 MED ORDER — ASPIRIN EC 81 MG PO TBEC
81.0000 mg | DELAYED_RELEASE_TABLET | Freq: Every day | ORAL | Status: DC
  Administered 2011-12-09 – 2011-12-11 (×3): 81 mg via ORAL
  Filled 2011-12-08 (×3): qty 1

## 2011-12-08 MED ORDER — ATORVASTATIN CALCIUM 20 MG PO TABS
20.0000 mg | ORAL_TABLET | Freq: Every day | ORAL | Status: DC
  Administered 2011-12-08 – 2011-12-13 (×6): 20 mg via ORAL
  Filled 2011-12-08 (×7): qty 1

## 2011-12-08 MED ORDER — ASPIRIN 81 MG PO CHEW: 324.0000 mg | CHEWABLE_TABLET | ORAL | Status: AC

## 2011-12-08 MED ORDER — ASPIRIN 81 MG PO TBEC: 81.0000 mg | DELAYED_RELEASE_TABLET | Freq: Every day | ORAL | Status: DC

## 2011-12-08 MED ORDER — VERAPAMIL HCL ER 240 MG PO TBCR
240.0000 mg | EXTENDED_RELEASE_TABLET | Freq: Every day | ORAL | Status: DC
  Administered 2011-12-08 – 2011-12-14 (×7): 240 mg via ORAL
  Filled 2011-12-08 (×7): qty 1

## 2011-12-08 MED ORDER — CHLORTHALIDONE 25 MG PO TABS
25.0000 mg | ORAL_TABLET | Freq: Every day | ORAL | Status: DC
  Administered 2011-12-08 – 2011-12-14 (×7): 25 mg via ORAL
  Filled 2011-12-08 (×8): qty 1

## 2011-12-08 MED ORDER — MONTELUKAST SODIUM 10 MG PO TABS
10.0000 mg | ORAL_TABLET | Freq: Every day | ORAL | Status: DC
  Administered 2011-12-08 – 2011-12-13 (×6): 10 mg via ORAL
  Filled 2011-12-08 (×7): qty 1

## 2011-12-08 MED ORDER — LOSARTAN POTASSIUM 50 MG PO TABS
100.0000 mg | ORAL_TABLET | Freq: Every day | ORAL | Status: DC
  Administered 2011-12-08 – 2011-12-14 (×7): 100 mg via ORAL
  Filled 2011-12-08 (×8): qty 2

## 2011-12-08 MED ORDER — CLOPIDOGREL BISULFATE 75 MG PO TABS
75.0000 mg | ORAL_TABLET | Freq: Every day | ORAL | Status: DC
  Administered 2011-12-08 – 2011-12-14 (×7): 75 mg via ORAL
  Filled 2011-12-08 (×9): qty 1

## 2011-12-08 MED ORDER — HEPARIN BOLUS VIA INFUSION
2000.0000 [IU] | Freq: Once | INTRAVENOUS | Status: AC
  Administered 2011-12-08: 2000 [IU] via INTRAVENOUS
  Filled 2011-12-08: qty 2000

## 2011-12-08 MED ORDER — INSULIN ASPART 100 UNIT/ML ~~LOC~~ SOLN
0.0000 [IU] | Freq: Three times a day (TID) | SUBCUTANEOUS | Status: DC
  Administered 2011-12-08: 3 [IU] via SUBCUTANEOUS
  Administered 2011-12-09 (×3): 2 [IU] via SUBCUTANEOUS
  Administered 2011-12-10: 4 [IU] via SUBCUTANEOUS
  Administered 2011-12-11: 2 [IU] via SUBCUTANEOUS
  Administered 2011-12-11: 3 [IU] via SUBCUTANEOUS
  Administered 2011-12-13 – 2011-12-14 (×4): 2 [IU] via SUBCUTANEOUS

## 2011-12-08 MED ORDER — ONDANSETRON HCL 4 MG/2ML IJ SOLN: 4.0000 mg | Freq: Four times a day (QID) | INTRAMUSCULAR | Status: DC | PRN

## 2011-12-08 MED ORDER — INSULIN ASPART 100 UNIT/ML ~~LOC~~ SOLN: 0.0000 [IU] | Freq: Every day | SUBCUTANEOUS | Status: DC

## 2011-12-08 MED ORDER — ONDANSETRON HCL 4 MG/2ML IJ SOLN
4.0000 mg | Freq: Once | INTRAMUSCULAR | Status: AC
  Administered 2011-12-08: 4 mg via INTRAVENOUS
  Filled 2011-12-08: qty 2

## 2011-12-08 MED ORDER — HYDROMORPHONE HCL PF 1 MG/ML IJ SOLN
1.0000 mg | INTRAMUSCULAR | Status: DC | PRN
  Administered 2011-12-13: 1 mg via INTRAVENOUS
  Filled 2011-12-08: qty 1

## 2011-12-08 MED ORDER — PANTOPRAZOLE SODIUM 40 MG PO TBEC
40.0000 mg | DELAYED_RELEASE_TABLET | Freq: Every day | ORAL | Status: DC
  Administered 2011-12-09 – 2011-12-14 (×6): 40 mg via ORAL
  Filled 2011-12-08 (×6): qty 1

## 2011-12-08 NOTE — Progress Notes (Signed)
ANTICOAGULATION CONSULT NOTE - Follow Up Consult  Pharmacy Consult for heparin Indication: PE/NSTEMI  Labs:  Basename 12/08/11 2141 12/08/11 1750 12/08/11 1221 12/08/11 1215 12/08/11 1049 12/08/11 0902 12/08/11 0846  HGB -- -- -- -- -- 16.7 15.1  HCT -- -- -- -- -- 49.0 44.5  PLT -- -- -- -- -- -- 253  APTT -- -- 49* -- -- -- --  LABPROT -- -- 14.4 -- -- -- 13.5  INR -- -- 1.10 -- -- -- 1.01  HEPARINUNFRC 0.19* -- -- -- -- -- --  CREATININE -- -- -- -- -- 1.10 1.07  CKTOTAL -- 135 -- 129 135 -- --  CKMB -- 5.4* -- 5.6* 5.8* -- --  TROPONINI -- <0.30 -- 0.46* 0.51* -- --    Assessment: 71yo male subtherapeutic on heparin with initial dosing for PE/NSTEMI.  Goal of Therapy:  Heparin level 0.3-0.7 units/ml   Plan:  Will give heparin 2000 units IV bolus x1 and increase gtt by 3-4 units/kg/hr to 1600 units/hr and check level with am labs.  Colleen Can PharmD BCPS 12/08/2011,10:53 PM

## 2011-12-08 NOTE — Progress Notes (Signed)
12/08/11 0950  OTHER  CSW Follow Up Status No follow-up needed

## 2011-12-08 NOTE — Progress Notes (Addendum)
ANTICOAGULATION CONSULT NOTE - Initial Consult  Pharmacy Consult for UFH Indication: NSTEMI  No Known Allergies  Patient Measurements: Height: 5\' 8"  (172.7 cm) Weight: 200 lb (90.719 kg) IBW/kg (Calculated) : 68.4  Heparin Dosing Weight: 77kg  Vital Signs: Temp: 98.5 F (36.9 C) (05/26 0824) Temp src: Oral (05/26 0824) BP: 172/91 mmHg (05/26 0824) Pulse Rate: 114  (05/26 0824)  Labs:  Basename 12/08/11 0902 12/08/11 0846  HGB 16.7 15.1  HCT 49.0 44.5  PLT -- 253  APTT -- --  LABPROT -- 13.5  INR -- 1.01  HEPARINUNFRC -- --  CREATININE 1.10 --  CKTOTAL -- --  CKMB -- --  TROPONINI -- --    Estimated Creatinine Clearance: 66.4 ml/min (by C-G formula based on Cr of 1.1).   Medical History: Past Medical History  Diagnosis Date  . CAD (coronary artery disease)     Status post non-ST elevation myocardial infarction January 2012 secondary to occluded branch of the circumflex coronary artery/medical treatment  . Myocardial infarction     s/p non-ST elevation MI and status-post 3 vessel coronary bypass grafting in 8/05.   . Presence of stent in left circumflex coronary artery     s/p Cypher stenting of the circumflex on Aug 2006  . Ejection fraction < 50%     normal EF, Ejection fraction 60% by bedside echocardiogram January 2013.  Marland Kitchen HTN (hypertension)     poorly controlled  . DM2 (diabetes mellitus, type 2)   . Dyslipidemia   . ACE inhibitor intolerance   . Asthma   . Recurrent aspiration bronchitis/pneumonia     Hx  . S/P CABG (coronary artery bypass graft) 2006  . Pulmonary embolism     July 2012 place on Coumadin    Medications:   (Not in a hospital admission)  Assessment: 71 y/o male patient admitted with chest pain, h/o PE, now with positive troponin requiring anticoagulation for NSTEMI.  Goal of Therapy:  Heparin level 0.3-0.7 units/ml Monitor platelets by anticoagulation protocol: Yes   Plan:  Heparin 4000 unit IV bolus followed by infusion at  1000 units/hr. Check 8 hour heparin level with daily cbc and heparin level.  Verlene Mayer, PharmD, BCPS Pager [phone] 12/08/2011,9:32 AM  CTA reveals large b/l PE, will increase gtt rate to 1250 units/hr and check xa level in 8 hours. Plan to start coumadin tomorrow.

## 2011-12-08 NOTE — ED Provider Notes (Signed)
History     CSN: [id number]  Arrival date & time 12/08/11  4098   First MD Initiated Contact with Patient 12/08/11 [phone]      Chief Complaint  Patient presents with  . Chest Pain  . Shortness of Breath    (Consider location/radiation/quality/duration/timing/severity/associated sxs/prior treatment) Patient is a 71 y.o. male presenting with chest pain and shortness of breath. The history is provided by the patient.  Chest Pain Primary symptoms include shortness of breath. Pertinent negatives for primary symptoms include no fever, no palpitations and no abdominal pain.    Shortness of Breath  Associated symptoms include chest pain and shortness of breath. Pertinent negatives include no fever.  pt with hx cad, cabg, PE presents w dyspnea w minimal exertion for past 2-3 days, constant. Better w rest. Also notes in past day left lower chest pressure, occasionally notes pain worse w deep breathing. Denies cough or fever. No leg pain or swelling. No orthopnea or pnd. Hx cabg 2005 w subsequent angioplasty 2006. States current symptoms feel more similar to PE last year, and less like his prior cardiac cp. Pt is now off coumadin.   Past Medical History  Diagnosis Date  . CAD (coronary artery disease)     Status post non-ST elevation myocardial infarction January 2012 secondary to occluded branch of the circumflex coronary artery/medical treatment  . Myocardial infarction     s/p non-ST elevation MI and status-post 3 vessel coronary bypass grafting in 8/05.   . Presence of stent in left circumflex coronary artery     s/p Cypher stenting of the circumflex on Aug 2006  . Ejection fraction < 50%     normal EF, Ejection fraction 60% by bedside echocardiogram January 2013.  Marland Kitchen HTN (hypertension)     poorly controlled  . DM2 (diabetes mellitus, type 2)   . Dyslipidemia   . ACE inhibitor intolerance   . Asthma   . Recurrent aspiration bronchitis/pneumonia     Hx  . S/P CABG (coronary artery  bypass graft) 2006  . Pulmonary embolism     July 2012 place on Coumadin    Past Surgical History  Procedure Date  . Coronary angioplasty with stent placement 2006  . Coronary artery bypass 2005     Family History  Problem Relation Age of Onset  . Stroke Other     family Hx- also DM and Neop prostate    History  Substance Use Topics  . Smoking status: Former Smoker -- 0.3 packs/day for 30 years    Types: Cigarettes    Quit date: 07/15/1972  . Smokeless tobacco: Current User    Types: Chew   Comment: chews 1/2 pack tobacco per day  . Alcohol Use: No      Review of Systems  Constitutional: Negative for fever and chills.  HENT: Negative for neck pain.   Eyes: Negative for redness.  Respiratory: Positive for shortness of breath.   Cardiovascular: Positive for chest pain. Negative for palpitations and leg swelling.  Gastrointestinal: Negative for abdominal pain.  Genitourinary: Negative for flank pain.  Musculoskeletal: Negative for back pain.  Skin: Negative for rash.  Neurological: Negative for headaches.  Hematological: Does not bruise/bleed easily.  Psychiatric/Behavioral: Negative for confusion.    Allergies  Review of patient's allergies indicates no known allergies.  Home Medications   Current Outpatient Rx  Name Route Sig Dispense Refill  . ACETAMINOPHEN 325 MG PO TABS Oral Take 650 mg by mouth every 4 (four) hours as needed.      Marland Kitchen  ASPIRIN 81 MG PO TBEC Oral Take 81 mg by mouth daily.      . CHLORTHALIDONE 25 MG PO TABS Oral Take 1 tablet (25 mg total) by mouth daily. 30 tablet 6  . CINNAMON 500 MG PO TABS Oral Take 2 tablets by mouth daily.      Marland Kitchen CLOPIDOGREL BISULFATE 75 MG PO TABS Oral Take 1 tablet (75 mg total) by mouth daily. 30 tablet 6  . COQ-10 150 MG PO CAPS Oral Take 1 capsule by mouth daily.      . OMEGA-3 FATTY ACIDS 1000 MG PO CAPS Oral Take 2 g by mouth daily.     Marland Kitchen FLUTICASONE PROPIONATE 50 MCG/ACT NA SUSP Nasal Place 2 sprays into the  nose daily.      Marland Kitchen FLUTICASONE-SALMETEROL 250-50 MCG/DOSE IN AEPB Inhalation Inhale 1 puff into the lungs 2 (two) times daily.      . ISOSORBIDE MONONITRATE ER 60 MG PO TB24  TAKE 3 TABLETS BY MOUTH DAILY 90 tablet 6  . LOSARTAN POTASSIUM 100 MG PO TABS Oral Take 1 tablet (100 mg total) by mouth daily. 30 tablet 6  . METFORMIN HCL ER 500 MG PO TB24 Oral Take 500 mg by mouth daily with breakfast.      . METOPROLOL TARTRATE 50 MG PO TABS  TAKE 1 AND 1/2 TABLETS BY MOUTH TWICE DAILY 90 tablet 6  . MONTELUKAST SODIUM 10 MG PO TABS Oral Take 10 mg by mouth daily.      Marland Kitchen NITROGLYCERIN 0.4 MG SL SUBL Sublingual Place 0.4 mg under the tongue every 5 (five) minutes as needed.      Marland Kitchen OMEPRAZOLE-SODIUM BICARBONATE 40-1100 MG PO CAPS Oral Take 1 capsule by mouth daily.      Marland Kitchen PITAVASTATIN CALCIUM 4 MG PO TABS Oral Take 1 tablet by mouth daily.      Marland Kitchen VERAPAMIL HCL ER 240 MG PO TBCR  TAKE 1 TABLET (240 MG TOTAL) BY MOUTH DAILY. 30 tablet 6    BP 172/91  Pulse 114  Temp(Src) 98.5 F (36.9 C) (Oral)  Resp 24  Ht 5\' 8"  (1.727 m)  Wt 200 lb (90.719 kg)  BMI 30.41 kg/m2  SpO2 96%  Physical Exam  Nursing note and vitals reviewed. Constitutional: He is oriented to person, place, and time. He appears well-developed and well-nourished.  HENT:  Head: Atraumatic.  Nose: Nose normal.  Mouth/Throat: Oropharynx is clear and moist.  Eyes: Conjunctivae are normal. Pupils are equal, round, and reactive to light.  Neck: Neck supple. No JVD present. No tracheal deviation present.  Cardiovascular: Regular rhythm and intact distal pulses.  Exam reveals no gallop and no friction rub.   No murmur heard. Pulmonary/Chest: Effort normal and breath sounds normal. No accessory muscle usage. No respiratory distress.  Abdominal: Soft. Bowel sounds are normal. He exhibits no distension. There is no tenderness.  Musculoskeletal: Normal range of motion. He exhibits no edema and no tenderness.  Neurological: He is alert and  oriented to person, place, and time.  Skin: Skin is warm and dry.  Psychiatric: He has a normal mood and affect.    ED Course  Procedures (including critical care time)   Labs Reviewed  CBC  COMPREHENSIVE METABOLIC PANEL  PROTIME-INR      MDM  Iv ns. 02 [place]. Monitor, ecg. Labs.   Pcxr.    Date: 12/08/2011  Rate: 112  Rhythm: sinus tachycardia  QRS Axis: normal  Intervals: normal  ST/T Wave abnormalities: ST depressions inferiorly  and ST depressions laterally  Conduction Disutrbances:none  Narrative Interpretation:   Old EKG Reviewed: changes noted   Given hx pe, taken off coumadin, tachy, ecg changes, will start heparin bolus and gtt. Pharmacy consult re heparin management/dosing.   Jed Limerick, pts cardiologist called-  They will see in ed/admit.   Recheck pt no change in condition, cp improved, dyspnea persists.    Ct positive, pt already on coumadin. Discussed results w pt and family.    CRITICAL CARE Performed by: Suzi Roots   Total critical care time: 45 minutes  Critical care time was exclusive of separately billable procedures and treating other patients.  Critical care was necessary to treat or prevent imminent or life-threatening deterioration.  Critical care was time spent personally by me on the following activities: development of treatment plan with patient and/or surrogate as well as nursing, discussions with consultants, evaluation of patient's response to treatment, examination of patient, obtaining history from patient or surrogate, ordering and performing treatments and interventions, ordering and review of laboratory studies, ordering and review of radiographic studies, pulse oximetry and re-evaluation of patient's condition.          Suzi Roots, MD 12/08/11 1310

## 2011-12-08 NOTE — ED Notes (Signed)
Cardiology MD at bedside.

## 2011-12-08 NOTE — ED Notes (Signed)
Admitting at bedside 

## 2011-12-08 NOTE — ED Notes (Signed)
Cardiology aware of troponin  

## 2011-12-08 NOTE — ED Notes (Signed)
EMS-pt reports sob last night, woke up this am with tightness to left chest wall and bilateral rib cage. Pt with hx of PE. Pt reports severe sob with ambulation. 4 nitros given pta, pt reports pain 1/10 at this time after nitro. 18g(L)AC. 324 asa given pta.

## 2011-12-08 NOTE — Progress Notes (Signed)
  Echocardiogram 2D Echocardiogram has been performed.  Renne Platts L 12/08/2011, 3:11 PM

## 2011-12-08 NOTE — Consult Note (Signed)
CARDIOLOGY CONSULT NOTE   Patient ID: Henry [name] MRN: [id number] DOB/AGE: 10/11/1940 71 y.o.  Admit date: 12/08/2011  Primary Physician   Josue Hector, MD, MD Primary Cardiologist   GD  Reason for Consultation   Chest pain  Henry [name] is a 71 y.o. male with a history of CAD.  He has noted gradually increasing dyspnea on exertion for several weeks. This has gradually gotten worse, to the point that he has been short of breath with minimal exertion. He noticed this yesterday as he could not walk room-room without stopping for breath. He was getting short of breath at rest last p.m. He went to bed and tried to sleep but was unable to get much rest. He does do a little but was awakened by chest pain, left-sided, at a 10/10. He describes it as both pain and pressure/tightness. It was worse with deep inspiration and position change would help temporarily. He was also short of breath but denies nausea, vomiting or diaphoresis. He would try to rest and would get some rest but the pain would wake him again after a brief time. The chest pain resolved but the tightness/pressure is still there. He took a nitroglycerin but his symptoms did not resolve. The symptoms remind him both of his angina and of his PE symptoms. When his symptoms did not resolve, he came to the emergency room. In the emergency room, his chest pain is down to a 4/10 on IV nitroglycerin. This is the first time he has had chest pain since his PE.   Past Medical History  Diagnosis Date  . CAD (coronary artery disease)     Status post non-ST elevation myocardial infarction January 2012 secondary to occluded branch of the circumflex coronary artery/medical treatment  . Myocardial infarction     s/p non-ST elevation MI and status-post 3 vessel coronary bypass grafting in 8/05.   . Presence of stent in left circumflex coronary artery     s/p Cypher stenting of the circumflex on Aug 2006  . Ejection fraction < 50%    normal EF, Ejection fraction 60% by bedside echocardiogram January 2013.  Marland Kitchen HTN (hypertension)     poorly controlled  . DM2 (diabetes mellitus, type 2)   . Dyslipidemia   . ACE inhibitor intolerance   . Asthma   . Recurrent aspiration bronchitis/pneumonia     Hx  . S/P CABG (coronary artery bypass graft) 2006  . Pulmonary embolism     July 2012 place on Coumadin     Past Surgical History  Procedure Date  . Coronary angioplasty with stent placement 2006  . Coronary artery bypass 2005     No Known Allergies  I have reviewed the patient's current medications    . heparin  4,000 Units Intravenous Once  .  morphine injection  4 mg Intravenous Once  . nitroGLYCERIN  2-200 mcg/min Intravenous Once  . ondansetron (ZOFRAN) IV  4 mg Intravenous Once      . sodium chloride 20 mL/hr at 12/08/11 0854  . heparin       Medication Sig  acetaminophen (TYLENOL) 325 MG tablet Take 650 mg by mouth every 4 (four) hours as needed.    aspirin 81 MG EC tablet Take 81 mg by mouth daily.    chlorthalidone (HYGROTON) 25 MG tablet Take 1 tablet (25 mg total) by mouth daily.  Cinnamon 500 MG TABS Take 2 tablets by mouth daily.    clopidogrel (PLAVIX) 75 MG tablet Take  1 tablet (75 mg total) by mouth daily.  Coenzyme Q10 (COQ-10) 150 MG CAPS Take 1 capsule by mouth daily.    fish oil-omega-3 fatty acids 1000 MG capsule Take 2 g by mouth daily.   fluticasone (FLONASE) 50 MCG/ACT nasal spray Place 2 sprays into the nose daily.    Fluticasone-Salmeterol (ADVAIR DISKUS) 250-50 MCG/DOSE AEPB Inhale 1 puff into the lungs 2 (two) times daily.    isosorbide mononitrate (IMDUR) 60 MG 24 hr tablet TAKE 3 TABLETS BY MOUTH DAILY  losartan (COZAAR) 100 MG tablet Take 1 tablet (100 mg total) by mouth daily.  metFORMIN (GLUCOPHAGE-XR) 500 MG 24 hr tablet Take 500 mg by mouth daily with breakfast.    metoprolol (LOPRESSOR) 50 MG tablet TAKE 1 AND 1/2 TABLETS BY MOUTH TWICE DAILY  montelukast (SINGULAIR) 10 MG  tablet Take 10 mg by mouth daily.    nitroGLYCERIN (NITROSTAT) 0.4 MG SL tablet Place 0.4 mg under the tongue every 5 (five) minutes as needed.    omeprazole-sodium bicarbonate (ZEGERID) 40-1100 MG per capsule Take 1 capsule by mouth daily.    Pitavastatin Calcium (LIVALO) 4 MG TABS Take 1 tablet by mouth daily.    verapamil (CALAN-SR) 240 MG CR tablet TAKE 1 TABLET (240 MG TOTAL) BY MOUTH DAILY.     History   Social History  . Marital Status: Married    Spouse Name: N/A    Number of Children: N/A  . Years of Education: N/A   Occupational History  . Retired   Social History Main Topics  . Smoking status: Former Smoker -- 0.3 packs/day for 30 years    Types: Cigarettes    Quit date: 07/15/1972  . Smokeless tobacco: Current User    Types: Chew   Comment: chews 1/2 pack tobacco per day  . Alcohol Use: No  . Drug Use: No  . Sexually Active: Not on file   Other Topics Concern  . Not on file   Social History Narrative   Married, retired.     Family History  Problem Relation Age of Onset  . Stroke Other     family Hx- also DM and Neop prostate     ROS: He has gained 7 or 8 pounds but the weight gain has been gradual and he denies lower extremity edema, orthopnea or PND. Until his shortness of breath worsened, he was able to do things around the house without difficulty. He has occasional musculoskeletal aches and pains. He is compliant with his medications. He has not had any recent illnesses, fevers or chills. He has not had melena. Full 14 point review of systems complete and found to be negative unless listed above.  Physical Exam: Blood pressure 172/91, pulse 114, temperature 98.5 F (36.9 C), temperature source Oral, resp. rate 24, height 5\' 8"  (1.727 m), weight 200 lb (90.719 kg), SpO2 96.00%.  General: Well developed, well nourished, male in no acute distress Head: Eyes PERRLA, No xanthomas. Normocephalic and atraumatic, oropharynx without edema or exudate. Dentition  good Lungs: Clear bilaterally with a few rales  Heart: HRRR S1 S2, no rub/gallop, no significant  murmur. pulses are 2+ upper extrem, slightly decreased in both lower extremities.   Neck: No carotid bruits. No lymphadenopathy.  JVD not elevated. Abdomen: Bowel sounds present, abdomen soft and slightly tender in the left upper quadrant without masses or hernias noted. Msk:  No spine or cva tenderness. No weakness, no joint deformities or effusions. He has some tenderness in the left lateral lower  rib edge. Extremities: No clubbing or cyanosis. No edema.  Neuro: Alert and oriented X 3. No focal deficits noted. Psych:  Good affect, responds appropriately Skin: No rashes or lesions noted.  Labs:   Lab Results  Component Value Date   WBC 13.8* 12/08/2011   HGB 16.7 12/08/2011   HCT 49.0 12/08/2011   MCV 84.1 12/08/2011   PLT 253 12/08/2011    Basename 12/08/11 0846  INR 1.01    Lab 12/08/11 0902  NA 141  K 3.8  CL 106  CO2 --  BUN 19  CREATININE 1.10  CALCIUM --  PROT --  BILITOT --  ALKPHOS --  ALT --  AST --  GLUCOSE 145*    Basename 12/08/11 0859  TROPIPOC 0.21*   Echo: 07/17/2010 EF 60%, apical WMA, diastolic dysfunction, mild MR  ECG: 08-Dec-2011 08:20:32  SINUS TACHYCARDIA ~ V-rate> 99 INFERIOR INFARCT, AGE INDETERMINATE ~ Q >62mS, initial axis(240,-30) BORDERLINE R WAVE PROGRESSION, ANTERIOR LEADS ~ R < 0.58mV Standard 12 Lead Report ~ Unconfirmed Interpretation Vent. rate 112 BPM PR interval 160 ms QRS duration 98 ms QT/QTc 324/442 ms P-R-T axes 51 46 *   Radiology:  Dg Chest Port 1 View 12/08/2011  *RADIOLOGY REPORT*  Clinical Data: Chest pain, shortness of breath  PORTABLE CHEST - 1 VIEW  Comparison: CT chest dated 01/28/2011  Findings: Lungs are clear. No pleural effusion or pneumothorax.  Mild cardiomegaly. Postsurgical changes related to prior CABG.  IMPRESSION: No evidence of acute cardiopulmonary disease.  Mild cardiomegaly.  Original Report Authenticated  By: Charline Bills, M.D.    ASSESSMENT AND PLAN:   The patient was seen today by Dr Andee Lineman, the patient evaluated and the data reviewed.  1. dyspnea: A CT angiogram of the chest has been ordered. Heparin has been ordered. We will followup on the results. If the CT is positive for PE, M.D. advise on lifelong Coumadin.  2. Elevated troponin and abnormal ECG: His ECG has some diffuse inferolateral ST depression. His initial troponin is mildly elevated. We will check full cardiac enzymes and continue to cycle those. M.D. to review records and advise further evaluation depending on results of the above testing. We will hold his metformin and can continue other home meds plus sliding scale insulin.  Signed: Theodore Demark 12/08/2011, 9:43 AM Co-Sign MD  I have reviewed the history is documented by the physician's assistant.  The patient presents with pleuritic chest pain and shortness of breath.  He has a prior history of pulmonary emboli.  CT scan today confirms the presence of new large bilateral pulmonary emboli particularly on the left side. Patient will be treated with intravenous heparin as well as Coumadin.  He is hemodynamically stable and there is no indication for TPA. EKG changes are likely secondary to supply demand mismatch and nonspecific in a setting of pulmonary emboli.  Troponins are also only mildly elevated. The patient will require lifelong Coumadin.  Prior to initiation of Coumadin and I have drawn a hypercoagulable screen. Decision will need to be made if the patient needs to stay on triple therapy with DAPT and Coumadin.  Probably Plavix can be discontinued prior to hospital discharge. The patient will be admitted for further monitoring and will need to 48 hours of overlap with Coumadin and heparin. I discussed the results with the patient and his wife and they understand the plan. Henry [name] 12:32 PM 12/08/2011

## 2011-12-09 DIAGNOSIS — I2699 Other pulmonary embolism without acute cor pulmonale: Principal | ICD-10-CM

## 2011-12-09 LAB — BASIC METABOLIC PANEL
BUN: 27 mg/dL — ABNORMAL HIGH (ref 6–23)
CO2: 20 mEq/L (ref 19–32)
Chloride: 102 mEq/L (ref 96–112)
Creatinine, Ser: 1.3 mg/dL (ref 0.50–1.35)

## 2011-12-09 LAB — LIPID PANEL
HDL: 31 mg/dL — ABNORMAL LOW (ref >39)
Total CHOL/HDL Ratio: 4 RATIO
VLDL: 44 mg/dL — ABNORMAL HIGH (ref 0–40)

## 2011-12-09 LAB — CBC
HCT: 41.5 % (ref 39.0–52.0)
MCH: 27.9 pg (ref 26.0–34.0)
MCV: 85.7 fL (ref 78.0–100.0)
RBC: 4.84 MIL/uL (ref 4.22–5.81)
WBC: 13 10*3/uL — ABNORMAL HIGH (ref 4.0–10.5)

## 2011-12-09 LAB — GLUCOSE, CAPILLARY
Glucose-Capillary: 126 mg/dL — ABNORMAL HIGH (ref 70–99)
Glucose-Capillary: 148 mg/dL — ABNORMAL HIGH (ref 70–99)
Glucose-Capillary: 133 mg/dL — ABNORMAL HIGH (ref 70–99)
Glucose-Capillary: 142 mg/dL — ABNORMAL HIGH (ref 70–99)

## 2011-12-09 LAB — CARDIAC PANEL(CRET KIN+CKTOT+MB+TROPI)
Total CK: 151 U/L (ref 7–232)
Troponin I: 0.34 ng/mL (ref <0.30)

## 2011-12-09 MED ORDER — WARFARIN VIDEO
Freq: Once | Status: AC
  Administered 2011-12-09: 15:00:00

## 2011-12-09 MED ORDER — WARFARIN - PHARMACIST DOSING INPATIENT
Freq: Every day | Status: DC
  Administered 2011-12-09 – 2011-12-13 (×2)

## 2011-12-09 MED ORDER — COUMADIN BOOK
Freq: Once | Status: AC
  Administered 2011-12-09: 15:00:00
  Filled 2011-12-09: qty 1

## 2011-12-09 MED ORDER — WARFARIN SODIUM 7.5 MG PO TABS
7.5000 mg | ORAL_TABLET | Freq: Once | ORAL | Status: AC
  Administered 2011-12-09: 7.5 mg via ORAL
  Filled 2011-12-09: qty 1

## 2011-12-09 NOTE — Progress Notes (Signed)
    Subjective:  The patient is feeling better. He has less shortness of breath and his chest pain has nearly resolved. Still dyspneic with low-level activity.  Objective:  Vital Signs in the last 24 hours: Temp:  [98.4 F (36.9 C)-98.7 F (37.1 C)] 98.7 F (37.1 C) (05/27 0800) Pulse Rate:  [80-113] 97  (05/27 0800) Resp:  [12-23] 20  (05/27 0800) BP: (100-156)/(59-102) 128/83 mmHg (05/27 0700) SpO2:  [95 %-100 %] 99 % (05/27 0923) FiO2 (%):  [100 %] 100 % (05/26 1201) Weight:  [89.5 kg (197 lb 5 oz)] 89.5 kg (197 lb 5 oz) (05/26 1321)  Intake/Output from previous day: 05/26 0701 - 05/27 0700 In: 1274 [P.O.:680; I.V.:594] Out: 610 [Urine:610]  Physical Exam: Pt is alert and oriented, NAD at rest. HEENT: normal Neck: JVP - normal Lungs: CTA bilaterally CV: RRR without murmur or gallop Abd: soft, NT, Positive BS, no hepatomegaly Ext: no C/C/E, distal pulses intact and equal Skin: warm/dry no rash  Lab Results:  Basename 12/09/11 0310 12/08/11 0902 12/08/11 0846  WBC 13.0* -- 13.8*  HGB 13.5 16.7 --  PLT 234 -- 253    Basename 12/09/11 0310 12/08/11 0902 12/08/11 0846  NA 136 141 --  K 4.1 3.8 --  CL 102 106 --  CO2 20 -- 22  GLUCOSE 128* 145* --  BUN 27* 19 --  CREATININE 1.30 1.10 --    Basename 12/09/11 0310 12/08/11 1750  TROPONINI 0.34* <0.30    Cardiac Studies: 2D Echo: Study Conclusions  - Left ventricle: The cavity size was normal. Wall thickness was increased in a pattern of mild LVH. Systolic function was normal. The estimated ejection fraction was in the range of 55% to 60%. The study is not technically sufficient to allow evaluation of LV diastolic function. - Regional wall motion abnormality: Mild hypokinesis of the entire inferior myocardium; cannot exclude mild hypokinesis of the basal-mid inferolateral myocardium. - Ventricular septum: The contour showed diastolic flattening. - Right ventricle: The cavity size was mildly  dilated. Systolic function was moderately reduced. - Right atrium: Bowing of interatrial septum R-->L consistent with elevated RA pressure The atrium was mildly dilated. - Pulmonary arteries: PA peak pressure: 60mm Hg (S). Impressions:  - The right ventricular systolic pressure was increased consistent with severe pulmonary hypertension.  Chest CTA: IMPRESSION:  1. Large burden of bilateral pulmonary emboli as described above.  Interventricular straightening in the heart suggests the  possibility of right heart strain. Critical Value/emergent results  were called by telephone at the time of interpretation on  12/08/2011 at 12:55 p.m. to Dr. Meredith Mody., who verbally  acknowledged these results.   Tele: Sinus rhythm  Assessment/Plan:  1. Bilateral pulmonary emboli, recurrent 2. Elevated cardiac markers, likely secondary to #1 with evidence of right heart strain on echo 3. CAD status post CABG  The patient is clinically improving. He will require lifelong Coumadin as per the consult note of Dr. Andee Lineman. He will continue on heparin bridge and his heparin level this morning is 0.57. His INR is 1.1. He will require intravenous unfractionated heparin until his INR is therapeutic.   Tonny Bollman, M.D. 12/09/2011, 9:28 AM

## 2011-12-09 NOTE — Progress Notes (Signed)
ANTICOAGULATION CONSULT NOTE - Follow Up Consult  Pharmacy Consult for heparin and warfarin Indication: Recurrent, bilateral pulmonary embolism  Labs:  Basename 12/09/11 0452 12/09/11 0310 12/08/11 2141 12/08/11 1750 12/08/11 1221 12/08/11 1215 12/08/11 0902 12/08/11 0846  HGB -- 13.5 -- -- -- -- 16.7 --  HCT -- 41.5 -- -- -- -- 49.0 44.5  PLT -- 234 -- -- -- -- -- 253  APTT -- -- -- -- 49* -- -- --  LABPROT 14.1 -- -- -- 14.4 -- -- 13.5  INR 1.07 -- -- -- 1.10 -- -- 1.01  HEPARINUNFRC 0.57 -- 0.19* -- -- -- -- --  CREATININE -- 1.30 -- -- -- -- 1.10 1.07  CKTOTAL -- 151 -- 135 -- 129 -- --  CKMB -- 4.3* -- 5.4* -- 5.6* -- --  TROPONINI -- 0.34* -- <0.30 -- 0.46* -- --    Assessment: Henry [name] is a 71 year old gentleman admitted 5/26 with chest pain subsequently found to have bilateral pulmonary embolus. This is recurrent, and the patient will be on anticoagulation for life. He is currently therapeutic on heparin 1600 units/hour, and he is to begin warfarin therapy today 5/27. Today will be day 1/5 of heparin/warfarin overlap. Hgb 13.5, plt 234, INR 1.07.  Goal of Therapy:  Heparin level 0.3-0.7 units/ml   Plan:  1. Continue heparin 1600 units/hour 2. Warfarin 7.5 mg PO x1 dose @1800  5/27 3. Warfarin education, book, and video 4. F/U daily INR, CBC, HL while on heparin  Vandella Ord, [place] R PharmD 12/09/2011,12:52 PM

## 2011-12-10 LAB — HEPARIN LEVEL (UNFRACTIONATED): Heparin Unfractionated: 0.39 IU/mL (ref 0.30–0.70)

## 2011-12-10 LAB — CBC
Hemoglobin: 12.9 g/dL — ABNORMAL LOW (ref 13.0–17.0)
MCH: 27.9 pg (ref 26.0–34.0)
RBC: 4.62 MIL/uL (ref 4.22–5.81)

## 2011-12-10 LAB — PROTIME-INR: Prothrombin Time: 14.5 seconds (ref 11.6–15.2)

## 2011-12-10 LAB — LUPUS ANTICOAGULANT PANEL
Lupus Anticoagulant: NOT DETECTED
PTTLA 4:1 Mix: 57 secs — ABNORMAL HIGH (ref 28.0–43.0)

## 2011-12-10 LAB — GLUCOSE, CAPILLARY: Glucose-Capillary: 138 mg/dL — ABNORMAL HIGH (ref 70–99)

## 2011-12-10 LAB — PROTEIN C ACTIVITY: Protein C Activity: 119 % (ref 75–133)

## 2011-12-10 LAB — PROTEIN S ACTIVITY: Protein S Activity: 200 % — ABNORMAL HIGH (ref 69–129)

## 2011-12-10 MED ORDER — WARFARIN SODIUM 7.5 MG PO TABS
7.5000 mg | ORAL_TABLET | Freq: Once | ORAL | Status: AC
  Administered 2011-12-10: 7.5 mg via ORAL
  Filled 2011-12-10: qty 1

## 2011-12-10 MED ORDER — DOCUSATE SODIUM 100 MG PO CAPS
100.0000 mg | ORAL_CAPSULE | Freq: Two times a day (BID) | ORAL | Status: DC | PRN
  Administered 2011-12-10: 100 mg via ORAL
  Filled 2011-12-10: qty 1

## 2011-12-10 MED ORDER — BIOTENE DRY MOUTH MT LIQD
15.0000 mL | Freq: Two times a day (BID) | OROMUCOSAL | Status: DC
  Administered 2011-12-10 – 2011-12-13 (×7): 15 mL via OROMUCOSAL

## 2011-12-10 NOTE — Progress Notes (Signed)
    Subjective:  Mild chest pain only with coughing. Persistent dyspnea with activity, none at rest. No other complaints. Appetite good.  Objective:  Vital Signs in the last 24 hours: Temp:  [97.4 F (36.3 C)-98.7 F (37.1 C)] 98.7 F (37.1 C) (05/28 1600) Pulse Rate:  [57-81] 58  (05/28 1630) Resp:  [17-19] 18  (05/28 1600) BP: (98-145)/(53-78) 123/67 mmHg (05/28 1630) SpO2:  [97 %-100 %] 97 % (05/28 1630) FiO2 (%):  [21 %] 21 % (05/28 0910)  Intake/Output from previous day: 05/27 0701 - 05/28 0700 In: 960 [P.O.:440; I.V.:520] Out: 1525 [Urine:1525]  Physical Exam: Pt is alert and oriented, NAD HEENT: normal Neck: JVP - normal Lungs: CTA bilaterally CV: RRR without murmur or gallop Abd: soft, NT, Positive BS, no hepatomegaly Ext: no C/C/E, distal pulses intact and equal Skin: warm/dry no rash   Lab Results:  Basename 12/10/11 0528 12/09/11 0310  WBC 9.9 13.0*  HGB 12.9* 13.5  PLT 226 234    Basename 12/09/11 0310 12/08/11 0902 12/08/11 0846  NA 136 141 --  K 4.1 3.8 --  CL 102 106 --  CO2 20 -- 22  GLUCOSE 128* 145* --  BUN 27* 19 --  CREATININE 1.30 1.10 --    Basename 12/09/11 0310 12/08/11 1750  TROPONINI 0.34* <0.30   Tele: sinus rhythm  Assessment/Plan:   1. Bilateral pulmonary emboli, recurrent  2. Elevated cardiac markers, likely secondary to #1 with evidence of right heart strain on echo  3. CAD status post CABG  No significant clinical changes. INR only 1.1 today - will require several more days in the hospital until INR is therapeutic considering that he has large PE burden. Will likely transfer to tele bed in the am. Otherwise continue same Rx. Heparin level therapeutic this am at 0.39.  Tonny Bollman, M.D. 12/10/2011, 6:29 PM

## 2011-12-10 NOTE — Progress Notes (Signed)
ANTICOAGULATION CONSULT NOTE - Follow Up Consult  Pharmacy Consult for heparin and warfarin Indication: Recurrent, bilateral pulmonary embolism  Labs:  Basename 12/10/11 0528 12/09/11 0452 12/09/11 0310 12/08/11 2141 12/08/11 1750 12/08/11 1221 12/08/11 1215 12/08/11 0902 12/08/11 0846  HGB 12.9* -- 13.5 -- -- -- -- -- --  HCT 39.9 -- 41.5 -- -- -- -- 49.0 --  PLT 226 -- 234 -- -- -- -- -- 253  APTT -- -- -- -- -- 49* -- -- --  LABPROT 14.5 14.1 -- -- -- 14.4 -- -- --  INR 1.11 1.07 -- -- -- 1.10 -- -- --  HEPARINUNFRC 0.39 0.57 -- 0.19* -- -- -- -- --  CREATININE -- -- 1.30 -- -- -- -- 1.10 1.07  CKTOTAL -- -- 151 -- 135 -- 129 -- --  CKMB -- -- 4.3* -- 5.4* -- 5.6* -- --  TROPONINI -- -- 0.34* -- <0.30 -- 0.46* -- --   Admit Complaint: 71 y.o.  male  admitted 12/08/2011 with chest pain subsequently found to have bilateral pulmonary embolus. This is recurrent, and the patient will be on anticoagulation for life. Pharmacy consulted to dose heparin and warfarin  Overnight Events: 12/10/2011 none noted  Assessment: Anticoagulation: Day 2/5 day minimum overlap heparin with warfarin. Heparin level at goal on 1600 units/hr, H/H down slightly, platelets stable, INR up slightly after first dose of warfarin 5/27.  Cardiovascular: CAD s/p NSTEMI January 2012, stent placement 2006, EF 50%, HTN, hyperlipidemia: ASA, atorvastatin, chlorthalidone, clopidogrel, losartan, metoprolol, verapamil; BP at goal since admission, now trending up, HR 60-80s,   Endocrinology: DM: SSI,  Glucose < 200  Gastrointestinal / Nutrition: Protonix (on PTA)  Pulmonary: COPD, Asthma: flonase, flovent, montelukast; 98% on RA, ongoing SOB with PE.  PTA Medication Issues: Home Meds Not Ordered: IMDUR, metformin, fish oil,  cinnamon, co-Q10,   Best Practices: DVT Prophylaxis:  Full dose IV heparin  Goal of Therapy:  Heparin level 0.3-0.7 units/ml INR 2-3   Plan:   Continue heparin 1600 units/hour  Warfarin 7.5  mg PO x1 dose   F/U daily INR, CBC, HL while on heparin   Thank you for allowing pharmacy to be a part of this patients care team.  Lovenia Kim Pharm.D., BCPS Clinical Pharmacist 12/10/2011 9:41 AM Pager: [phone] Phone: [phone]

## 2011-12-10 NOTE — Care Management Note (Addendum)
    Page 1 of 1   12/13/2011     3:10:47 PM   CARE MANAGEMENT NOTE 12/13/2011  Patient:  Henry [name], Henry [name]   Account Number:  [id number]  Date Initiated:  12/10/2011  Documentation initiated by:  Junius Creamer  Subjective/Objective Assessment:   adm w elev trop, pul emb     Action/Plan:   lives w wife, pcp dr Darcel Bayley n[place]   Anticipated DC Date:  12/12/2011   Anticipated DC Plan:  HOME W HOME HEALTH SERVICES      DC Planning Services  CM consult      Choice offered to / List presented to:             Status of service:   Medicare Important Message given?   (If response is "NO", the following Medicare IM given date fields will be blank) Date Medicare IM given:   Date Additional Medicare IM given:    Discharge Disposition:    Per UR Regulation:  Reviewed for med. necessity/level of care/duration of stay  If discussed at Long Length of Stay Meetings, dates discussed:    Comments:  12/13/11 Persis Graffius,RN,BSN 1130 MET WITH PT AND WIFE TO DISCUSS DC PLANS.  WIFE TO PROVIDE CARE AT DISCHARGE.  DC PLANNED FOR TOMORROW.  PT TO DC ON COUMADIN.  NURSE TO SHOW PT/WIFE COUMADIN VIDEO TODAY.  5/28 12:36p debbie dowell rn,bsn [phone]

## 2011-12-11 LAB — PROTEIN S, TOTAL: Protein S Ag, Total: 130 % (ref 60–150)

## 2011-12-11 LAB — CBC
HCT: 39.2 % (ref 39.0–52.0)
MCHC: 32.9 g/dL (ref 30.0–36.0)
MCV: 85 fL (ref 78.0–100.0)
RDW: 14.4 % (ref 11.5–15.5)

## 2011-12-11 LAB — GLUCOSE, CAPILLARY
Glucose-Capillary: 127 mg/dL — ABNORMAL HIGH (ref 70–99)
Glucose-Capillary: 111 mg/dL — ABNORMAL HIGH (ref 70–99)

## 2011-12-11 MED ORDER — WARFARIN SODIUM 10 MG PO TABS
10.0000 mg | ORAL_TABLET | Freq: Once | ORAL | Status: AC
  Administered 2011-12-11: 10 mg via ORAL
  Filled 2011-12-11: qty 1

## 2011-12-11 MED ORDER — ACETAMINOPHEN 325 MG PO TABS
650.0000 mg | ORAL_TABLET | ORAL | Status: DC | PRN
  Administered 2011-12-11 – 2011-12-14 (×4): 650 mg via ORAL
  Filled 2011-12-11 (×4): qty 2

## 2011-12-11 MED ORDER — ZOLPIDEM TARTRATE 5 MG PO TABS
5.0000 mg | ORAL_TABLET | Freq: Every evening | ORAL | Status: DC | PRN
  Administered 2011-12-13: 5 mg via ORAL
  Filled 2011-12-11: qty 1

## 2011-12-11 NOTE — Progress Notes (Signed)
ANTICOAGULATION CONSULT NOTE - Follow Up Consult  Pharmacy Consult for Heparin and Coumadin Indication: Bilateral PE (recurrent) No Known Allergies  Patient Measurements: Height: 5\' 8"  (172.7 cm) Weight: 197 lb 5 oz (89.5 kg) IBW/kg (Calculated) : 68.4  Heparin Dosing Weight: 77kg  Vital Signs: Temp: 97.7 F (36.5 C) (05/29 1225) Temp src: Oral (05/29 1225) BP: 146/80 mmHg (05/29 1225) Pulse Rate: 71  (05/29 1225)  Labs:  Henry [name] 12/11/11 0541 12/10/11 0528 12/09/11 0452 12/09/11 0310 12/08/11 1750  HGB 12.9* 12.9* -- -- --  HCT 39.2 39.9 -- 41.5 --  PLT 240 226 -- 234 --  APTT -- -- -- -- --  LABPROT 15.4* 14.5 14.1 -- --  INR 1.19 1.11 1.07 -- --  HEPARINUNFRC 0.48 0.39 0.57 -- --  CREATININE -- -- -- 1.30 --  CKTOTAL -- -- -- 151 135  CKMB -- -- -- 4.3* 5.4*  TROPONINI -- -- -- 0.34* <0.30    Estimated Creatinine Clearance: 55.8 ml/min (by C-G formula based on Cr of 1.3).   Medications:  Heparin 1600 units/hr   Assessment: 71yom on Heparin bridging to Coumadin (Day 3 of minimum 5 Day overlap) for bilateral PE. Heparin level (0.48) remains therapeutic with heparin drip 1600 units/hr. INR (1.19) is subtherapeutic and has trended up minimally with 7.5mg  x 2 - will increase dose. - H/H and Plts stable - No significant bleeding reported  Goal of Therapy:  INR 2-3 Heparin level 0.3-0.7 units/ml Monitor platelets by anticoagulation protocol: Yes   Plan:  1. Continue heparin drip 1600 units/hr (16 ml/hr) 2. Coumadin 10mg  po x 1 today 3. Follow-up AM INR, heparin level and CBC  Henry [name] [phone] 12/11/2011,1:38 PM

## 2011-12-11 NOTE — Progress Notes (Signed)
    Subjective:  Remains dyspneic. No chest pain or other complaints.  Objective:  Vital Signs in the last 24 hours: Temp:  [97.8 F (36.6 C)-99.2 F (37.3 C)] 99.2 F (37.3 C) (05/29 0745) Pulse Rate:  [57-81] 74  (05/29 0821) Resp:  [14-18] 16  (05/29 0821) BP: (98-152)/(53-80) 152/80 mmHg (05/29 0821) SpO2:  [96 %-100 %] 100 % (05/29 0821) FiO2 (%):  [21 %] 21 % (05/28 0910)  Intake/Output from previous day: 05/28 0701 - 05/29 0700 In: 1704 [P.O.:1080; I.V.:624] Out: 3100 [Urine:3100]  Physical Exam: Pt is alert and oriented, NAD HEENT: normal Neck: JVP - normal Lungs: CTA bilaterally CV: RRR without murmur or gallop Abd: soft, NT, Positive BS, no hepatomegaly Ext: no C/C/E, distal pulses intact and equal Skin: warm/dry no rash   Lab Results:  Basename 12/11/11 0541 12/10/11 0528  WBC 9.3 9.9  HGB 12.9* 12.9*  PLT 240 226    Basename 12/09/11 0310 12/08/11 0902 12/08/11 0846  NA 136 141 --  K 4.1 3.8 --  CL 102 106 --  CO2 20 -- 22  GLUCOSE 128* 145* --  BUN 27* 19 --  CREATININE 1.30 1.10 --    Basename 12/09/11 0310 12/08/11 1750  TROPONINI 0.34* <0.30   Tele: sinus rhythm  Assessment/Plan:   1. Bilateral pulmonary emboli, recurrent  2. Elevated cardiac markers, likely secondary to #1 with evidence of right heart strain on echo  3. CAD status post CABG  No major changes. Pt is stable and will transfer him to a tele bed today. Anticipate he will need at least another 72 hours in the hospital based on his INR trend.   Tonny Bollman, M.D. 12/11/2011, 8:39 AM

## 2011-12-12 LAB — CBC
HCT: 40.4 % (ref 39.0–52.0)
MCH: 28 pg (ref 26.0–34.0)
MCV: 85.1 fL (ref 78.0–100.0)
Platelets: 241 10*3/uL (ref 150–400)
RBC: 4.75 MIL/uL (ref 4.22–5.81)

## 2011-12-12 LAB — GLUCOSE, CAPILLARY
Glucose-Capillary: 93 mg/dL (ref 70–99)
Glucose-Capillary: 149 mg/dL — ABNORMAL HIGH (ref 70–99)

## 2011-12-12 MED ORDER — WARFARIN SODIUM 10 MG PO TABS
10.0000 mg | ORAL_TABLET | Freq: Once | ORAL | Status: AC
  Administered 2011-12-12: 10 mg via ORAL
  Filled 2011-12-12: qty 1

## 2011-12-12 NOTE — Progress Notes (Signed)
ANTICOAGULATION CONSULT NOTE - Follow Up Consult  Pharmacy Consult for Heparin and Coumadin Indication: Bilateral PE (recurrent)  No Known Allergies  Patient Measurements: Height: 5\' 8"  (172.7 cm) Weight: 197 lb 5 oz (89.5 kg) IBW/kg (Calculated) : 68.4  Heparin Dosing Weight: 77kg  Vital Signs: Temp: 97.7 F (36.5 C) (05/30 0429) Temp src: Oral (05/30 0429) BP: 146/78 mmHg (05/30 0429) Pulse Rate: 59  (05/30 0429)  Labs:  Basename 12/12/11 0548 12/11/11 0541 12/10/11 0528  HGB 13.3 12.9* --  HCT 40.4 39.2 39.9  PLT 241 240 226  APTT -- -- --  LABPROT 18.8* 15.4* 14.5  INR 1.54* 1.19 1.11  HEPARINUNFRC 0.65 0.48 0.39  CREATININE -- -- --  CKTOTAL -- -- --  CKMB -- -- --  TROPONINI -- -- --    Estimated Creatinine Clearance: 55.8 ml/min (by C-G formula based on Cr of 1.3).   Medications:  Heparin 1600 units/hr   Assessment: 71yom on Heparin bridging to Coumadin (Day 4 of minimum 5 Day overlap) for bilateral PE. Heparin level (0.65) remains therapeutic with heparin drip 1600 units/hr. INR (1.54) is subtherapeutic but trended up with Coumadin 10mg  - will repeat dose. - H/H and Plts stable - No significant bleeding reported  Goal of Therapy:  INR 2-3 Heparin level 0.3-0.7 units/ml Monitor platelets by anticoagulation protocol: Yes   Plan:  1. Continue heparin drip 1600 units/hr (16 ml/hr) 2. Repeat Coumadin 10mg  po x 1 today 3. Follow-up AM INR, heparin level and CBC  Cleon Dew [phone] 12/12/2011,9:40 AM

## 2011-12-12 NOTE — Progress Notes (Signed)
    Subjective:  No change in symptoms. Patient is dyspneic after washing up this morning. No chest pain.  Objective:  Vital Signs in the last 24 hours: Temp:  [97.7 F (36.5 C)-98 F (36.7 C)] 97.7 F (36.5 C) (05/30 0429) Pulse Rate:  [58-71] 59  (05/30 0429) Resp:  [16-18] 16  (05/30 0429) BP: (125-146)/(49-80) 146/78 mmHg (05/30 0429) SpO2:  [95 %-100 %] 95 % (05/30 0429)  Intake/Output from previous day: 05/29 0701 - 05/30 0700 In: 600 [P.O.:600] Out: 1000 [Urine:1000]  Physical Exam: Pt is alert and oriented, NAD HEENT: normal Neck: JVP - normal Lungs: CTA bilaterally CV: RRR without murmur or gallop Abd: soft, NT, Positive BS, obese Ext: no C/C/E, distal pulses intact and equal Skin: warm/dry no rash  Lab Results:  Basename 12/12/11 0548 12/11/11 0541  WBC 8.9 9.3  HGB 13.3 12.9*  PLT 241 240   No results found for this basename: NA:2,K:2,CL:2,CO2:2,GLUCOSE:2,BUN:2,CREATININE:2 in the last 72 hours No results found for this basename: TROPONINI:2,CK,MB:2 in the last 72 hours  Tele: Sinus rhythm, no arrhythmia  Assessment/Plan  1. Acute bilateral pulmonary emboli 2. Coronary artery disease status post CABG with multiple graft occlusions  The patient's INR is trending up. He is at 1.54 today. I would anticipate that he will require at least another 48 hours in the hospital. After review of his cardiac cath data, I would be inclined to continue him on warfarin alone once he is therapeutic. I don't see any indication to keep him on long-term Plavix as there are no patent stents.  12/12/2011, 9:02 AM

## 2011-12-13 LAB — PROTIME-INR: INR: 2.3 — ABNORMAL HIGH (ref 0.00–1.49)

## 2011-12-13 LAB — GLUCOSE, CAPILLARY
Glucose-Capillary: 131 mg/dL — ABNORMAL HIGH (ref 70–99)
Glucose-Capillary: 127 mg/dL — ABNORMAL HIGH (ref 70–99)
Glucose-Capillary: 151 mg/dL — ABNORMAL HIGH (ref 70–99)

## 2011-12-13 LAB — CBC
HCT: 40.8 % (ref 39.0–52.0)
MCH: 27.8 pg (ref 26.0–34.0)
MCHC: 32.6 g/dL (ref 30.0–36.0)
MCV: 85.2 fL (ref 78.0–100.0)
Platelets: 253 10*3/uL (ref 150–400)
RDW: 14.3 % (ref 11.5–15.5)
WBC: 9.5 10*3/uL (ref 4.0–10.5)

## 2011-12-13 LAB — BETA-2-GLYCOPROTEIN I ABS, IGG/M/A
Beta-2 Glyco I IgG: 0 G Units (ref <20)
Beta-2-Glycoprotein I IgA: 8 A Units (ref <20)

## 2011-12-13 LAB — HEPARIN LEVEL (UNFRACTIONATED): Heparin Unfractionated: 0.68 IU/mL (ref 0.30–0.70)

## 2011-12-13 LAB — CARDIOLIPIN ANTIBODIES, IGG, IGM, IGA
Anticardiolipin IgA: 19 APL U/mL (ref <22)
Anticardiolipin IgM: 0 MPL U/mL — ABNORMAL LOW (ref <11)

## 2011-12-13 MED ORDER — WARFARIN SODIUM 7.5 MG PO TABS
7.5000 mg | ORAL_TABLET | Freq: Once | ORAL | Status: AC
  Administered 2011-12-13: 7.5 mg via ORAL
  Filled 2011-12-13: qty 1

## 2011-12-13 NOTE — Progress Notes (Signed)
ANTICOAGULATION CONSULT NOTE - Follow Up Consult  Pharmacy Consult for Heparin Indication: pulmonary embolus  No Known Allergies  Patient Measurements: Height: 5\' 8"  (172.7 cm) Weight: 197 lb 5 oz (89.5 kg) IBW/kg (Calculated) : 68.4  Heparin Dosing Weight:   Vital Signs: Temp: 97.7 F (36.5 C) (05/31 1326) Temp src: Oral (05/31 1326) BP: 126/76 mmHg (05/31 1326) Pulse Rate: 58  (05/31 1326)  Labs:  Basename 12/13/11 1401 12/13/11 0520 12/12/11 0548 12/11/11 0541  HGB -- 13.3 13.3 --  HCT -- 40.8 40.4 39.2  PLT -- 253 241 240  APTT -- -- -- --  LABPROT -- 25.7* 18.8* 15.4*  INR -- 2.30* 1.54* 1.19  HEPARINUNFRC 0.81* 0.83* 0.65 --  CREATININE -- -- -- --  CKTOTAL -- -- -- --  CKMB -- -- -- --  TROPONINI -- -- -- --    Estimated Creatinine Clearance: 55.8 ml/min (by C-G formula based on Cr of 1.3).   Medications:  Scheduled:    . antiseptic oral rinse  15 mL Mouth Rinse BID  . atorvastatin  20 mg Oral q1800  . Chlorhexidine Gluconate Cloth  6 each Topical q morning - 10a  . chlorthalidone  25 mg Oral Daily  . clopidogrel  75 mg Oral Daily  . fluticasone  1 spray Each Nare Daily  . Fluticasone-Salmeterol  1 puff Inhalation BID  . insulin aspart  0-15 Units Subcutaneous TID WC  . insulin aspart  0-5 Units Subcutaneous QHS  . losartan  100 mg Oral Daily  . metoprolol tartrate  75 mg Oral BID  . montelukast  10 mg Oral QHS  . mupirocin ointment  1 application Nasal BID  . pantoprazole  40 mg Oral Q1200  . verapamil  240 mg Oral Daily  . warfarin  10 mg Oral ONCE-1800  . warfarin  7.5 mg Oral ONCE-1800  . Warfarin - Pharmacist Dosing Inpatient   Does not apply q1800    Assessment: 71yo male with PE.  Heparin level this afternoon remain above goal.  No bleeding issues noted and IV pump on correct rate.  Pltc is stable.  Goal of Therapy:  Heparin level 0.3-0.7 units/ml Monitor platelets by anticoagulation protocol: Yes   Plan:  1.  Decrease heparin to  1350 units/hr 2.  Check heparin level in 6 hours.  Marisue Humble, PharmD Clinical Pharmacist [facility]

## 2011-12-13 NOTE — Progress Notes (Signed)
ANTICOAGULATION CONSULT NOTE - Follow Up Consult  Pharmacy Consult for Coumadin Indication: pulmonary embolus  No Known Allergies  Patient Measurements: Height: 5\' 8"  (172.7 cm) Weight: 197 lb 5 oz (89.5 kg) IBW/kg (Calculated) : 68.4  Heparin Dosing Weight:   Vital Signs: Temp: 97.6 F (36.4 C) (05/31 0559) Temp src: Oral (05/31 0559) BP: 124/82 mmHg (05/31 0559) Pulse Rate: 63  (05/31 0559)  Labs:  Basename 12/13/11 0520 12/12/11 0548 12/11/11 0541  HGB 13.3 13.3 --  HCT 40.8 40.4 39.2  PLT 253 241 240  APTT -- -- --  LABPROT 25.7* 18.8* 15.4*  INR 2.30* 1.54* 1.19  HEPARINUNFRC 0.83* 0.65 0.48  CREATININE -- -- --  CKTOTAL -- -- --  CKMB -- -- --  TROPONINI -- -- --    Estimated Creatinine Clearance: 55.8 ml/min (by C-G formula based on Cr of 1.3).   Medications:  Scheduled:    . antiseptic oral rinse  15 mL Mouth Rinse BID  . atorvastatin  20 mg Oral q1800  . Chlorhexidine Gluconate Cloth  6 each Topical q morning - 10a  . chlorthalidone  25 mg Oral Daily  . clopidogrel  75 mg Oral Daily  . fluticasone  1 spray Each Nare Daily  . Fluticasone-Salmeterol  1 puff Inhalation BID  . insulin aspart  0-15 Units Subcutaneous TID WC  . insulin aspart  0-5 Units Subcutaneous QHS  . losartan  100 mg Oral Daily  . metoprolol tartrate  75 mg Oral BID  . montelukast  10 mg Oral QHS  . mupirocin ointment  1 application Nasal BID  . pantoprazole  40 mg Oral Q1200  . verapamil  240 mg Oral Daily  . warfarin  10 mg Oral ONCE-1800  . Warfarin - Pharmacist Dosing Inpatient   Does not apply q1800    Assessment: 71yo male with B-PE, large clot burden.  Today is day #5/5 minimum overlap with 24hr therapeutic overlap, with therapeutic INR 2.3 x 1.  Heparin was adjusted overnight with f/u HL this afternoon.  No bleeding problems noted.  Goal of Therapy:  INR 2-3 and Heparin level 0.3-0.7 Monitor platelets by anticoagulation protocol: Yes   Plan:  1.  Coumadin 7.5mg   today 2.  F/U HL this afternoon 3.  Continue daily INR, if therapeutic in AM- will be able to d/c Heparin.  Marisue Humble, PharmD Clinical Pharmacist [facility]    [place], Racine Erby P 12/13/2011,10:25 AM

## 2011-12-13 NOTE — Progress Notes (Signed)
ANTICOAGULATION CONSULT NOTE - Follow Up Consult  Pharmacy Consult for heparin Indication: pulmonary embolus  Labs:  Basename 12/13/11 0520 12/12/11 0548 12/11/11 0541  HGB 13.3 13.3 --  HCT 40.8 40.4 39.2  PLT 253 241 240  APTT -- -- --  LABPROT 25.7* 18.8* 15.4*  INR 2.30* 1.54* 1.19  HEPARINUNFRC 0.83* 0.65 0.48  CREATININE -- -- --  CKTOTAL -- -- --  CKMB -- -- --  TROPONINI -- -- --    Assessment: 71yo male now supratherapeutic on heparin after stable at goal for several days though levels had been trending up.  Goal of Therapy:  Heparin level 0.3-0.7 units/ml   Plan:  Will decrease heparin gtt by ~1 unit/kg/hr to 1500 units/hr and check level in 8hr.  Colleen Can PharmD BCPS 12/13/2011,6:18 AM

## 2011-12-13 NOTE — Progress Notes (Signed)
ANTICOAGULATION CONSULT NOTE - Follow Up Consult  Pharmacy Consult for Heparin Indication: pulmonary embolus  No Known Allergies  Patient Measurements: Height: 5\' 8"  (172.7 cm) Weight: 197 lb 5 oz (89.5 kg) IBW/kg (Calculated) : 68.4  Heparin Dosing Weight:   Vital Signs: Temp: 98 F (36.7 C) (05/31 2053) Temp src: Oral (05/31 2053) BP: 105/64 mmHg (05/31 2053) Pulse Rate: 53  (05/31 2053)  Labs:  Henry [name] 12/13/11 2116 12/13/11 1401 12/13/11 0520 12/12/11 0548 12/11/11 0541  HGB -- -- 13.3 13.3 --  HCT -- -- 40.8 40.4 39.2  PLT -- -- 253 241 240  APTT -- -- -- -- --  LABPROT -- -- 25.7* 18.8* 15.4*  INR -- -- 2.30* 1.54* 1.19  HEPARINUNFRC 0.68 0.81* 0.83* -- --  CREATININE -- -- -- -- --  CKTOTAL -- -- -- -- --  CKMB -- -- -- -- --  TROPONINI -- -- -- -- --    Estimated Creatinine Clearance: 55.8 ml/min (by C-G formula based on Cr of 1.3).   Medications:  Scheduled:     . antiseptic oral rinse  15 mL Mouth Rinse BID  . atorvastatin  20 mg Oral q1800  . Chlorhexidine Gluconate Cloth  6 each Topical q morning - 10a  . chlorthalidone  25 mg Oral Daily  . clopidogrel  75 mg Oral Daily  . fluticasone  1 spray Each Nare Daily  . Fluticasone-Salmeterol  1 puff Inhalation BID  . insulin aspart  0-15 Units Subcutaneous TID WC  . insulin aspart  0-5 Units Subcutaneous QHS  . losartan  100 mg Oral Daily  . metoprolol tartrate  75 mg Oral BID  . montelukast  10 mg Oral QHS  . mupirocin ointment  1 application Nasal BID  . pantoprazole  40 mg Oral Q1200  . verapamil  240 mg Oral Daily  . warfarin  7.5 mg Oral ONCE-1800  . Warfarin - Pharmacist Dosing Inpatient   Does not apply q1800    Assessment: 71yo male with PE.  Heparin level 5/31 afternoon = 0.81 on heparin 1500 units/hr, rate was reduced to 1350 units/hr and recheck of heparin level = 0.68.  No bleeding issues noted and IV pump on correct rate.  Pltc is stable.  Goal of Therapy:  Heparin level 0.3-0.7  units/ml Monitor platelets by anticoagulation protocol: Yes   Plan:  1.  Continue heparin gtt at 1350 units/hr as heparin level is within desired rate 2. F/u am heparin level and CBC  Juliette Alcide, PharmD, BCPS.  Pager: [phone] 12/13/2011 9:50 PM

## 2011-12-14 DIAGNOSIS — I2699 Other pulmonary embolism without acute cor pulmonale: Secondary | ICD-10-CM

## 2011-12-14 DIAGNOSIS — E785 Hyperlipidemia, unspecified: Secondary | ICD-10-CM

## 2011-12-14 LAB — CBC
HCT: 39.3 % (ref 39.0–52.0)
Hemoglobin: 12.9 g/dL — ABNORMAL LOW (ref 13.0–17.0)
MCH: 27.7 pg (ref 26.0–34.0)
MCHC: 32.8 g/dL (ref 30.0–36.0)
MCV: 84.3 fL (ref 78.0–100.0)
RBC: 4.66 MIL/uL (ref 4.22–5.81)

## 2011-12-14 LAB — GLUCOSE, CAPILLARY: Glucose-Capillary: 132 mg/dL — ABNORMAL HIGH (ref 70–99)

## 2011-12-14 LAB — HEPARIN LEVEL (UNFRACTIONATED): Heparin Unfractionated: 0.63 IU/mL (ref 0.30–0.70)

## 2011-12-14 MED ORDER — WARFARIN SODIUM 2 MG PO TABS
2.0000 mg | ORAL_TABLET | Freq: Once | ORAL | Status: AC
  Administered 2011-12-14: 2 mg via ORAL
  Filled 2011-12-14: qty 1

## 2011-12-14 MED ORDER — WARFARIN SODIUM 5 MG PO TABS: 5.0000 mg | ORAL_TABLET | Freq: Every day | ORAL | Status: DC

## 2011-12-14 MED ORDER — WARFARIN 0.5 MG HALF TABLET
0.5000 mg | ORAL_TABLET | Freq: Once | ORAL | Status: DC
  Filled 2011-12-14: qty 1

## 2011-12-14 NOTE — Progress Notes (Signed)
Henry Bottoms, MD, [facility] ABIM Board Certified in Adult Cardiovascular Medicine,Internal Medicine and Critical Care Medicine      Subjective:   Patient is scheduled for discharge today.  His INR is 3.7.  We will follow him closely and Coumadin for one or 2 occasions to make sure that is under good dosing regimen of Coumadin.  The patient still reports some shortness of breath on exertion although there appears to be some improvement. He denies any chest pain.  He has no orthopnea or PND.  Objective:   Weight Range:  Vital Signs:   Temp:  [97.7 F (36.5 C)-98.9 F (37.2 C)] 98.9 F (37.2 C) (06/01 0510) Pulse Rate:  [53-81] 81  (06/01 1016) Resp:  [18-20] 19  (06/01 0510) BP: (105-147)/(64-82) 147/82 mmHg (06/01 1016) SpO2:  [93 %-97 %] 95 % (06/01 0810) Last BM Date: 12/14/11  Weight change: Filed Weights   12/08/11 0824 12/08/11 1321  Weight: 200 lb (90.719 kg) 197 lb 5 oz (89.5 kg)    Intake/Output:   Intake/Output Summary (Last 24 hours) at 12/14/11 1307 Last data filed at 12/14/11 0800  Gross per 24 hour  Intake    480 ml  Output   1400 ml  Net   -920 ml     Physical Exam: General:  Well appearing. No resp difficulty HEENT: normal Neck: supple. JVP . Carotids 2+ bilat; no bruits. No lymphadenopathy or thryomegaly appreciated. Cor: PMI nondisplaced. Regular rate & rhythm. No rubs, gallops or murmurs. Lungs: clear Abdomen: soft, nontender, nondistended. No hepatosplenomegaly. No bruits or masses. Good bowel sounds. Extremities: no cyanosis, clubbing, rash, edema Neuro: alert & orientedx3, cranial nerves grossly intact. moves all 4 extremities w/o difficulty.                         Telemetry: Normal sinus rhythm  Labs: Basic Metabolic Panel:  Lab 12/09/11 3086 12/08/11 0902 12/08/11 0846  NA 136 141 138  K 4.1 3.8 3.9  CL 102 106 101  CO2 20 -- 22  GLUCOSE 128* 145* 140*  BUN 27* 19 19  CREATININE 1.30 1.10 1.07  CALCIUM 8.7 -- 9.5  MG -- -- --  PHOS  -- -- --    Liver Function Tests:  Lab 12/08/11 0846  AST 24  ALT 20  ALKPHOS 51  BILITOT 0.4  PROT 8.0  ALBUMIN 3.8   No results found for this basename: LIPASE:5,AMYLASE:5 in the last 168 hours No results found for this basename: AMMONIA:3 in the last 168 hours  CBC:  Lab 12/14/11 0512 12/13/11 0520 12/12/11 0548 12/11/11 0541 12/10/11 0528  WBC 10.8* 9.5 8.9 9.3 9.9  NEUTROABS -- -- -- -- --  HGB 12.9* 13.3 13.3 12.9* 12.9*  HCT 39.3 40.8 40.4 39.2 39.9  MCV 84.3 85.2 85.1 85.0 86.4  PLT 251 253 241 240 226    Cardiac Enzymes:  Lab 12/09/11 0310 12/08/11 1750 12/08/11 1215 12/08/11 1049  CKTOTAL 151 135 129 135  CKMB 4.3* 5.4* 5.6* 5.8*  CKMBINDEX -- -- -- --  TROPONINI 0.34* <0.30 0.46* 0.51*     BNP: BNP (last 3 results)  Basename 12/08/11 1215  PROBNP 5628.0*    ABG    Component Value Date/Time   PHART 7.408 07/19/2010 1631   PCO2ART 36.1 07/19/2010 1631   PO2ART 86.0 07/19/2010 1631   HCO3 22.8 07/19/2010 1640   TCO2 22 12/08/2011 0902   ACIDBASEDEF 2.0 07/19/2010 1640   O2SAT  67.0 07/19/2010 1640     Other results: EKG: Not obtained Imaging:  No results found.   Medications:     Scheduled Medications:    . antiseptic oral rinse  15 mL Mouth Rinse BID  . atorvastatin  20 mg Oral q1800  . chlorthalidone  25 mg Oral Daily  . clopidogrel  75 mg Oral Daily  . fluticasone  1 spray Each Nare Daily  . Fluticasone-Salmeterol  1 puff Inhalation BID  . insulin aspart  0-15 Units Subcutaneous TID WC  . insulin aspart  0-5 Units Subcutaneous QHS  . losartan  100 mg Oral Daily  . metoprolol tartrate  75 mg Oral BID  . montelukast  10 mg Oral QHS  . pantoprazole  40 mg Oral Q1200  . verapamil  240 mg Oral Daily  . warfarin  0.5 mg Oral ONCE-1800  . warfarin  7.5 mg Oral ONCE-1800  . Warfarin - Pharmacist Dosing Inpatient   Does not apply q1800     Infusions:    . sodium chloride 10 mL/hr (12/12/11 2038)  . DISCONTD: heparin 1,350 Units/hr  (12/13/11 1509)     PRN Medications:  acetaminophen, docusate sodium, HYDROmorphone (DILAUDID) injection, nitroGLYCERIN, ondansetron (ZOFRAN) IV, zolpidem   Assessment:  #1 status post large bilateral pulmonary emboli #2 dyspnea secondary to #1 #3 coronary artery disease, Status post coronary artery bypass grafting #4 Lupus anticoagulant positive, dilute Russell viper venom test was not applied, nor was the one-to-one mix of the dilute Russell viper venom test applied.  We will proceed with this as an outpatient #5 MRSA positive On screening test.   Patient Active Problem List  Diagnoses  . DM  . HYPERTENSION  . OLD MYOCARDIAL INFARCTION  . CHRONIC OBSTRUCTIVE ASTHMA UNSPECIFIED  . ESOPHAGEAL REFLUX  . SHORTNESS OF BREATH  . COUGH  . TOBACCO ABUSE, HX OF  . POSTSURGICAL AORTOCORONARY BYPASS STATUS  . POSTSURG PERCUT TRANSLUMINAL COR ANGPLSTY STS  . Recurrent aspiration bronchitis/pneumonia  . Pulmonary embolism  . Plantar fasciitis      Plan/Discussion:    The patient has been started on warfarin.  He is therapeutic. He will be scheduled for a Coumadin check with Misty Stanley in the Coumadin clinic next Tuesday The patient can see me back in the clinic in the next couple of weeks. The patient previously was on dual antiplatelet therapy but we will discontinue aspirin and maintain him on Plavix and Coumadin to reduce his risk of bleeding. We will repeat an echocardiogram in approximately 3 months to make sure that his RV function has recovered and also to make sure he has no residual pulmonary hypertension.Based on this we can decide if he needs a repeat CT scan to make sure to test full resolution of his pulmonary emboli.If the patient has persistent thromboembolic disease and documented pulmonary hypertension by right heart catheterization as well as shortness of breath consideration may need to be given to thrombectomy. The patient appears to have a lupus anticoagulant and we  will further work this up as an outpatient will probably obtain a consultation from the coagulation clinic at [facility] and I will send E mailed to Dr. Isaiah Serge, for recommendations for further workup  Length of Stay: 6   Henry [name] 12/14/2011, 1:07 PM

## 2011-12-14 NOTE — Discharge Summary (Signed)
Discharge Summary   Patient ID: Henry [name],  MRN: [id number], DOB/AGE: Jul 03, 1941 71 y.o.  Admit date: 12/08/2011 Discharge date: 12/14/2011  Discharge Diagnoses Principal Problem:  *Pulmonary emboli Active Problems:  DM  HYPERTENSION  CAD (coronary artery disease)  Elevated troponin level  Dyslipidemia  Allergies No Known Allergies  Diagnostic Studies/Procedures  PORTABLE CHEST - 1 VIEW- 12/08/11 Comparison: CT chest dated 01/28/2011  Findings: Lungs are clear. No pleural effusion or pneumothorax.  Mild cardiomegaly. Postsurgical changes related to prior CABG.  IMPRESSION:  No evidence of acute cardiopulmonary disease.  Mild cardiomegaly.  CT ANGIOGRAPHY CHEST- 12/08/11 Technique: Multidetector CT imaging of the chest using the  standard protocol during bolus administration of intravenous  contrast. Multiplanar reconstructed images including MIPs were  obtained and reviewed to evaluate the vascular anatomy.  Contrast: OMNIPAQUE IOHEXOL 350 MG/ML SOLN CT chest  01/28/2011  Comparison: CT angio chest 01/28/2011  Findings: This study is positive for pulmonary emboli. Clot is  seen within the distal main pulmonary arteries bilaterally, left  greater than right. There is extensive clot in the lower lobe  branches to both lower lobes. Clot also extends into the segmental  and subsegmental branches to both lower lobes bilaterally. Clot is  seen within lobar and a segmental branch to the right upper lobe  and to lobar and segmental branches of the right middle lobe. Clot  extends into the lobar branch to the left upper lobe.  Patient is status post median sternotomy for CABG, and cardiomegaly  is stable.  Negative for pleural or pericardial effusion. Negative for  lymphadenopathy. Normal caliber thoracic aorta.  There is small hiatal hernia. There is some air within the upper  server upper thoracic esophagus. Trachea and mainstem bronchi are  patent.  There is  scattered areas of atelectasis, most prominent in the  right lower lobe.  A right middle lobe nodule measuring 8 x 5 mm on image number 54 of  the lung windows is stable compared to chest CT of 01/28/2011.  There is a tiny focus of atelectasis or airspace disease in the  perihilar right upper lobe on image number 51.  No acute or suspicious bony abnormalities.  Multiple small gallstones are seen within the gallbladder. At  least two partially visualized large stones are seen in the left  kidney, and appears similar to the prior abdomen CT of April 2010.  Stable partially visualized 2.9 cm laterally exophytic left upper  pole renal cyst. Parapelvic cysts in the upper pole left kidney  versus dilatation of the calyces related to renal stones appears  similar to prior abdominal CT.  IMPRESSION:  1. Large burden of bilateral pulmonary emboli as described above.  Interventricular straightening in the heart suggests the  possibility of right heart strain. Critical Value/emergent results  were called by telephone at the time of interpretation on  12/08/2011 at 12:55 p.m. to Dr. Meredith Mody., who verbally  acknowledged these results.  2. Stable 8 x 5 mm right middle lobe pulmonary nodule. Unchanged  compared to chest CT of July 2012. Given its stability over this  time, a follow-up noncontrast chest CT in 6-12 months is suggested.  3. Scattered areas of atelectasis. Small focal area of airspace  disease verse atelectasis in the right perihilar right upper lobe.  4. Cardiomegaly and prior CABG.  5. Left renal calculi, left renal cyst, and parapelvic cysts  versus dilatation of the upper pole calyces appear similar to  abdominal CT of 2010.  6. Cholelithiasis.  2D ECHOCARDIOGRAM WITHOUT CONTRAST- 12/08/11 Study Conclusions  - Left ventricle: The cavity size was normal. Wall thickness was increased in a pattern of mild LVH. Systolic function was normal. The estimated ejection fraction was in  the range of 55% to 60%. The study is not technically sufficient to allow evaluation of LV diastolic function. - Regional wall motion abnormality: Mild hypokinesis of the entire inferior myocardium; cannot exclude mild hypokinesis of the basal-mid inferolateral myocardium. - Ventricular septum: The contour showed diastolic flattening. - Right ventricle: The cavity size was mildly dilated. Systolic function was moderately reduced. - Right atrium: Bowing of interatrial septum R-->L consistent with elevated RA pressure The atrium was mildly dilated. - Pulmonary arteries: PA peak pressure: 60mm Hg (S). Impressions:  - The right ventricular systolic pressure was increased consistent with severe pulmonary hypertension. Transthoracic echocardiography. M-mode, complete 2D, spectral Doppler, and color Doppler. Height: Height: 172.7cm. Height: 68in. Weight: Weight: 90.7kg. Weight: 199.5lb. Body mass index: BMI: 30.4kg/m^2. Body surface area: BSA: 2.22m^2. Blood pressure: 147/102. Patient status: Inpatient. Location: Bedside. ------------------------------------------------------------ ------------------------------------------------------------ Left ventricle: The cavity size was normal. Wall thickness was increased in a pattern of mild LVH. Systolic function was normal. The estimated ejection fraction was in the range of 55% to 60%. Regional wall motion abnormalities: Mild hypokinesis of the entire inferior myocardium; cannot exclude mild hypokinesis of the basal-mid inferolateral myocardium. The study is not technically sufficient to allow evaluation of LV diastolic function. ------------------------------------------------------------ Aortic valve: Mildly thickened, mildly calcified leaflets. Doppler: No significant regurgitation. ------------------------------------------------------------ Aorta: Aortic root: The aortic root was normal in size. Ascending aorta: The ascending aorta  was normal in size. ------------------------------------------------------------ Mitral valve: Mildly thickened leaflets . Doppler: No significant regurgitation. ----------------------------------------------------------- Left atrium: The atrium was normal in size. ------------------------------------------------------------ Right ventricle: The cavity size was mildly dilated. Systolic function was moderately reduced. ------------------------------------------------------------ Ventricular septum: The contour showed diastolic flattening. -------------------------------------------------------- Pulmonic valve: Poorly visualized. Mildly thickened leaflets. Doppler: No significant regurgitation. ------------------------------------------------------------ Tricuspid valve: Mildly thickened leaflets. Doppler: Trivial regurgitation. ------------------------------------------------------------ Right atrium: Bowing of interatrial septum R-->L consistent with elevated RA pressure The atrium was mildly dilated. ------------------------------------------------------------ Pericardium: There was no pericardial effusion. ------------------------------------------------------------ 2D measurements Normal Doppler measurements Normal Left ventricle Main pulmonary LVID ED, 37.5 mm 43-52 artery chord, Pressure, S 60 mm =30 PLAX Hg LVID ES, 29.6 mm 23-38 Left ventricle chord, Ea, lat ann, 6.36 cm/s ------ PLAX tiss DP FS, chord, 21 % >29 Tricuspid valve PLAX Regurg peak 354 cm/s ------ LVPW, ED 11.8 mm ------ vel IVS/LVPW 1.01 <1.3 Peak RV-RA 50 mm ------ ratio, ED gradient, S Hg Ventricular septum Systemic veins IVS, ED 11.9 mm ------ Estimated 10 mm ------ Aorta CVP Hg Root diam, 32 mm ------ Right ventricle ED Pressure, S 60 mm <30 Left atrium Hg AP dim 34 mm ------ AP dim 1.61 cm/m^2 <2.2 index  History of Present Illness  Mr. Bivins is a 71yo male with the above problem list who  was admitted to [facility] on 12/08/11 with large bilateral pulmonary emboli.   Prior to his admission, he had reported increasing DOE for several weeks, which had gradually worsened to the point where he became short of breath on minimal exertion. He noticed the day before admission, that he could not ambulate room to room without stopping for breath and even at rest. He had difficulty sleeping due to intermittent left-sided chest pain rated at a 10/10. The patient has a prior history of PE and CAD, and this pain reminded  him of both.   Upon ED arrival, cardiac enzymes were found to be mildly elevated. EKG revealed inferolateral ST depressions. A CXR revealed no acute process as noted above. Mild cardiomegaly was observed. A CT-angio chest revealed a large bilateral PE burden, in addition to the other findings listed above. A 2D echocardiogram was ordered revealing mild LVH, LVEF 55-60%, mild hypokinesis of inferior myocardium, diastolic ventricular septal flattening, mild RV dilatations, moderate RV systolic dysfunction, elevated RA pressures and mild dilatation. PA peak pressure 60 mmHg. The study was noted to be technically limited. The patient was diagnosed with bilateral pulmonary emboli. The elevated cardiac biomarkers and ST depression on EKG was suspected to be secondary to elevated RV pressures and cardiac strain. He remained stable hemodynamically, but was admitted to the ICU.   Hospital Course   He was started on heparin with bridging to Coumadin. Outpatient medications were resumed. Imdur was held. A hypercoagulable screen was ordered which resulted a positive lupus anticoagulant.   The length of his admission was due to a slow-to-become-therapeutic INR on Coumadin therapy. His shortness of breath and chest pain improved gradually. He remained hymodynamically stable with no acute events.   Today, INR returned at 3.37 (up from 2.30 the day prior). He was evaluated by Dr. Earnestine Leys and  found to be stable for discharge. He noted that the patient still endorsed some DOE, however this has improved. His chest pain this morning was mild. He will be continue outpatient Tylenol for pain control. ASA was held to avoid triple therapy and increase his risk of bleeding. Plavix will be resumed.  Upon discussing with pharmacy, Coumadin will be dosed at 5mg  daily until he follows up in the Coumadin clinic on Tuesday. He will resume outpatient medications as listed below. He will follow-up with Dr. Earnestine Leys in approximately 2 weeks.  This information, including supplemental information on pulmonary emboli, has been clearly outlined in the discharge AVS.   Of note, he will be referred to the coagulation clinic at [facility] given the patient's elevated lupus anticoagulant this admission. Dr. Earnestine Leys will be arranging follow-up there. It was noted that a follow-up 2D echocardiogram should be repeated to ensure recovery of RV function with improvement in pulmonary HTN. Based on these findings, further consideration may be made for repeat CT and the need for thrombectomy.   Discharge Vitals:  Blood pressure 147/82, pulse 81, temperature 98.9 F (37.2 C), temperature source Oral, resp. rate 19, height 5\' 8"  (1.727 m), weight 89.5 kg (197 lb 5 oz), SpO2 95.00%.   Labs: Recent Labs  [facility] 12/14/11 0512 12/13/11 0520   WBC 10.8* 9.5   HGB 12.9* 13.3   HCT 39.3 40.8   MCV 84.3 85.2   PLT 251 253    Lab 12/09/11 0310 12/08/11 0902 12/08/11 0846  NA 136 141 138  K 4.1 3.8 3.9  CL 102 106 101  CO2 20 -- 22  BUN 27* 19 19  CREATININE 1.30 1.10 1.07  CALCIUM 8.7 -- 9.5  PROT -- -- 8.0  BILITOT -- -- 0.4  ALKPHOS -- -- 51  ALT -- -- 20  AST -- -- 24  AMYLASE -- -- --  LIPASE -- -- --  GLUCOSE 128* 145* 140*   Disposition:   Follow-up Information    Follow up with Boone CARD MOREHEAD on 12/17/2011. (For Coumadin clinic. )    Contact information:   [address] [place] [address]  Follow up with Peyton Bottoms, MD in 2 weeks. (Office will contact you with appointment date and time. )    Contact information:   [address] [place] [address] [phone]         Discharge Medications:  Medication List  As of 12/14/2011  4:13 PM   START taking these medications         warfarin 5 MG tablet   Commonly known as: COUMADIN   Take 1 tablet (5 mg total) by mouth daily.   Start taking on: 12/15/2011         CONTINUE taking these medications         acetaminophen 325 MG tablet   Commonly known as: TYLENOL      ADVAIR DISKUS 250-50 MCG/DOSE Aepb   Generic drug: Fluticasone-Salmeterol      chlorthalidone 25 MG tablet   Commonly known as: HYGROTON   Take 1 tablet (25 mg total) by mouth daily.      Cinnamon 500 MG Tabs      clopidogrel 75 MG tablet   Commonly known as: PLAVIX   Take 1 tablet (75 mg total) by mouth daily.      CoQ-10 150 MG Caps      fish oil-omega-3 fatty acids 1000 MG capsule      fluticasone 50 MCG/ACT nasal spray   Commonly known as: FLONASE      LIVALO 4 MG Tabs   Generic drug: Pitavastatin Calcium      losartan 100 MG tablet   Commonly known as: COZAAR   Take 1 tablet (100 mg total) by mouth daily.      metFORMIN 500 MG 24 hr tablet   Commonly known as: GLUCOPHAGE-XR      metoprolol 50 MG tablet   Commonly known as: LOPRESSOR      montelukast 10 MG tablet   Commonly known as: SINGULAIR      nitroGLYCERIN 0.4 MG SL tablet   Commonly known as: NITROSTAT      omeprazole-sodium bicarbonate 40-1100 MG per capsule   Commonly known as: ZEGERID      triamcinolone 55 MCG/ACT nasal inhaler   Commonly known as: NASACORT      verapamil 240 MG CR tablet   Commonly known as: CALAN-SR   TAKE 1 TABLET (240 MG TOTAL) BY MOUTH DAILY.         STOP taking these medications         aspirin 81 MG EC tablet      isosorbide mononitrate 60 MG 24 hr tablet          Where to get  your medications    These are the prescriptions that you need to pick up. We sent them to a specific pharmacy, so you will need to go there to get them.   [id number] - [place], [place] - [address]    [address] [place] [address]    Phone: [phone]        warfarin 5 MG tablet           Outstanding Labs/Studies: PT/INR at Coumadin clinic 12/17/11  Duration of Discharge Encounter: Greater than 30 minutes including physician time.  Signed, R. Hurman Horn, PA-C 12/14/2011, 4:13 PM

## 2011-12-14 NOTE — Discharge Instructions (Signed)
PLEASE REMEMBER TO BRING ALL OF YOUR MEDICATIONS TO EACH OF YOUR FOLLOW-UP OFFICE VISITS.  PLEASE ATTEND ALL FOLLOW-UP APPOINTMENTS.  PLEASE TAKE ALL MEDICATIONS AS PRESCRIBED.   Pulmonary Embolus  A pulmonary (lung) embolus (PE) is a blood clot that has traveled from another place in the body to the lung. Most clots come from deep veins in the legs or pelvis. PE is a dangerous and potentially life-threatening condition that can be treated if identified.  CAUSES  Blood clots form in a vein for different reasons. Usually several things cause blood clots. They include:  The flow of blood slows down.  The inside of the vein is damaged in some way.  The person has a condition that makes the blood clot more easily. These conditions may include:  Older age (especially over 76 years old).  Having a history of blood clots.  Having major or lengthy surgery. Hip surgery is particularly high-risk.  Breaking a hip or leg.  Sitting or lying still for a long time.  Cancer or cancer treatment.  Having a long, thin tube (catheter) placed inside a vein during a medical procedure.  Being overweight (obese).  Pregnancy and childbirth.  Medicines with estrogen.  Smoking.  Other circulation or heart problems.  SYMPTOMS  The symptoms of a PE usually start suddenly and include:  Shortness of breath.  Coughing.  Coughing up blood or blood-tinged mucus (phlegm).  Chest pain. Pain is often worse with deep breaths.  Rapid heartbeat.  DIAGNOSIS  If a PE is suspected, your caregiver will take a medical history and carry out a physical exam. Your caregiver will check for the risk factors listed above. Tests that also may be required include:  Blood tests, including studies of the clotting properties of your blood.  Imaging tests. Ultrasound, CT, MRI, and other tests can all be used to see if you have clots in your legs or lungs. If you have a clot in your legs and have breathing or chest problems, your  caregiver may conclude that you have a clot in your lungs. Further lung tests may not be needed.  An EKG can look for heart strain from blood clots in the lungs.  PREVENTION  Exercise the legs regularly. Take a brisk 30 minute walk every day.  Maintain a weight that is appropriate for your height.  Avoid sitting or lying in bed for long periods of time without moving your legs.  Women, particularly those over the age of 86, should consider the risks and benefits of taking estrogen medicines, including birth control pills.  Do not smoke, especially if you take estrogen medicines.  Long-distance travel can increase your risk. You should exercise your legs by walking or pumping the muscles every hour.  In hospital prevention:  Your caregiver will assess your need for preventive PE care (prophylaxis) when you are admitted to the hospital. If you are having surgery, your surgeon will assess you the day of or day after surgery.  Prevention may include medical and nonmedical measures.  TREATMENT  The most common treatment for a PE is blood thinning (anticoagulant) medicine, which reduces the blood's tendency to clot. Anticoagulants can stop new blood clots from forming and old ones from growing. They cannot dissolve existing clots. Your body does this by itself over time. Anticoagulants can be given by mouth, by intravenous (IV) access, or by injection. Your caregiver will determine the best program for you.  Less commonly, clot-dissolving drugs (thrombolytics) are used to dissolve a  PE. They carry a high risk of bleeding, so they are used mainly in severe cases.  Very rarely, a blood clot in the leg needs to be removed surgically.  If you are unable to take anticoagulants, your caregiver may arrange for you to have a filter placed in a main vein in your belly (abdomen). This filter prevents clots from traveling to your lungs.  HOME CARE INSTRUCTIONS  Take all medicines prescribed by your caregiver.  Follow the directions carefully.  You will most likely continue taking anticoagulants after you leave the hospital. Your caregiver will advise you on the length of treatment (usually 3 to 6 months, sometimes for life).  Taking too much or too little of an anticoagulant is dangerous. While taking this type of medicine, you will need to have regular blood tests to be sure the dose is correct. The dose can change for many reasons. It is critically important that you take this medicine exactly as prescribed and that you have blood tests exactly as directed.  Many foods can interfere with anticoagulants. These include foods high in vitamin K, such as spinach, kale, broccoli, cabbage, collard and turnip greens, Brussels sprouts, peas, cauliflower, seaweed, parsley, beef and pork liver, green tea, and soybean oil. Your caregiver should discuss limits on these foods with you or you should arrange a visit with a dietician to answer your questions.  Many medicines can interfere with anticoagulants. You must tell your caregiver about any and all medicines you take. This includesall vitamins and supplements. Be especially cautious with aspirin and anti-inflammatory medicines. Ask your caregiver before taking these.  Anticoagulants can have side effects, mostly excessive bruising or bleeding. You will need to hold pressure over cuts for longer than usual. Avoid alcoholic drinks or consume only very small amounts while taking this medicine.  If you are taking an anticoagulant:  Wear a medical alert bracelet.  Notify your dentist or other caregivers before procedures.  Avoid contact sports.  Ask your caregiver how soon you can go back to normal activities. Not being active can lead to new clots. Ask for a list of what you should and should not do.  Exercise your lower leg muscles. This is important while traveling.  You may need to wear compression stockings. These are tight elastic stockings that apply pressure to the  lower legs. This can help keep the blood in the legs from clotting.  If you are a smoker, you should quit.  Learn as much as you can about pulmonary embolisms.  SEEK MEDICAL CARE IF:  You notice a rapid heartbeat.  You feel weaker or more tired than usual.  You feel faint.  You notice increased bruising.  Your symptoms are not getting better in the time expected.  You are having side effects of medicine.  You have an oral temperature above 102 F (38.9 C).  You discover other family members with blood clots. This may require further testing for inherited diseases or conditions.  SEEK IMMEDIATE MEDICAL CARE IF:  You have chest pain.  You have trouble breathing.  You have new or increased swelling or pain in one leg.  You cough up blood.  You notice blood in vomit, in a bowel movement, or in urine.  You have an oral temperature above 102 F (38.9 C), not controlled by medicine.  You may have another PE. A blood clot in the lungs is a medical emergency. Call your local emergency services (911 in [place] to get to the nearest  hospital or clinic. Do not drive yourself.  MAKE SURE YOU:  Understand these instructions.  Will watch your condition.  Will get help right away if you are not doing well or get worse.  Document Released: 06/28/2000 Document Revised: 06/20/2011 Document Reviewed: 01/02/2009  [facility] Patient Information 2012 [place], [place].

## 2011-12-14 NOTE — Progress Notes (Signed)
12/14/2011 4:30 PM Nursing note Discharge avs form, medications already taken today and those due this evening given and explained to patient and family. RX called in explained to patient as well. Follow up appointments and when to call md discussed. Questions and concerns addressed. Pt. Verbalized understanding. F/u with coumadin clinic also discussed. Today's coumadin dose administered prior to d/c home per orders. D/c iv line. D/c tele. D/c home per orders with wife.  Norvell Ureste, Blanchard Kelch

## 2011-12-14 NOTE — Progress Notes (Addendum)
ANTICOAGULATION CONSULT NOTE - Follow Up Consult  Pharmacy Consult for Heparin & Coumadin Indication: B-PE with large clot burden  No Known Allergies  Patient Measurements: Height: 5\' 8"  (172.7 cm) Weight: 197 lb 5 oz (89.5 kg) IBW/kg (Calculated) : 68.4  Heparin Dosing Weight:  Vital Signs: Temp: 98.9 F (37.2 C) (06/01 0510) Temp src: Oral (06/01 0510) BP: 147/82 mmHg (06/01 1016) Pulse Rate: 81  (06/01 1016)  Labs:  Basename 12/14/11 0512 12/13/11 2116 12/13/11 1401 12/13/11 0520 12/12/11 0548  HGB 12.9* -- -- 13.3 --  HCT 39.3 -- -- 40.8 40.4  PLT 251 -- -- 253 241  APTT -- -- -- -- --  LABPROT 34.6* -- -- 25.7* 18.8*  INR 3.37* -- -- 2.30* 1.54*  HEPARINUNFRC 0.63 0.68 0.81* -- --  CREATININE -- -- -- -- --  CKTOTAL -- -- -- -- --  CKMB -- -- -- -- --  TROPONINI -- -- -- -- --    Estimated Creatinine Clearance: 55.8 ml/min (by C-G formula based on Cr of 1.3).   Medications:  Scheduled:    . antiseptic oral rinse  15 mL Mouth Rinse BID  . atorvastatin  20 mg Oral q1800  . chlorthalidone  25 mg Oral Daily  . clopidogrel  75 mg Oral Daily  . fluticasone  1 spray Each Nare Daily  . Fluticasone-Salmeterol  1 puff Inhalation BID  . insulin aspart  0-15 Units Subcutaneous TID WC  . insulin aspart  0-5 Units Subcutaneous QHS  . losartan  100 mg Oral Daily  . metoprolol tartrate  75 mg Oral BID  . montelukast  10 mg Oral QHS  . pantoprazole  40 mg Oral Q1200  . verapamil  240 mg Oral Daily  . warfarin  7.5 mg Oral ONCE-1800  . Warfarin - Pharmacist Dosing Inpatient   Does not apply q1800    Assessment: 71yo male with B-PE.  Today is day#6 of overlap with INR > 2 x 2 days.  Heparin level is therapeutic & INR is high this AM at 3.37.  No problems noted.  Goal of Therapy:  INR 2-3 & heparin level 0.3-0.7 Monitor platelets by anticoagulation protocol: Yes   Plan:  1.  Paged Dr.McDowell- OK to d/c heparin 2.  Coumadin 0.5mg  today 3.  F/U INR in AM  Marisue Humble, PharmD Clinical Pharmacist [facility]   12/14/11  1550- Patient is going home this evening.  I am concerned his INR may fall more than expected with the above dose.  I expect the jump in INR today was due to the 10mg  doses he received 2 days ago.  Therefore I will give him 2mg  today instead.  Marisue Humble, PharmD Clinical Pharmacist [facility]

## 2011-12-17 ENCOUNTER — Ambulatory Visit (INDEPENDENT_AMBULATORY_CARE_PROVIDER_SITE_OTHER): Payer: Medicare Other | Admitting: *Deleted

## 2011-12-17 DIAGNOSIS — I2699 Other pulmonary embolism without acute cor pulmonale: Secondary | ICD-10-CM

## 2011-12-17 DIAGNOSIS — Z7901 Long term (current) use of anticoagulants: Secondary | ICD-10-CM | POA: Insufficient documentation

## 2011-12-17 MED ORDER — WARFARIN SODIUM 5 MG PO TABS: 5.0000 mg | ORAL_TABLET | Freq: Every day | ORAL | Status: DC

## 2011-12-20 ENCOUNTER — Ambulatory Visit: Payer: Self-pay | Admitting: *Deleted

## 2011-12-20 DIAGNOSIS — I2699 Other pulmonary embolism without acute cor pulmonale: Secondary | ICD-10-CM

## 2011-12-20 DIAGNOSIS — Z7901 Long term (current) use of anticoagulants: Secondary | ICD-10-CM

## 2011-12-30 ENCOUNTER — Encounter: Payer: Self-pay | Admitting: Physician Assistant

## 2011-12-30 ENCOUNTER — Ambulatory Visit (INDEPENDENT_AMBULATORY_CARE_PROVIDER_SITE_OTHER): Payer: Medicare Other | Admitting: Physician Assistant

## 2011-12-30 VITALS — BP 156/88 | HR 71 | Ht 68.0 in | Wt 191.8 lb

## 2011-12-30 DIAGNOSIS — I2510 Atherosclerotic heart disease of native coronary artery without angina pectoris: Secondary | ICD-10-CM

## 2011-12-30 DIAGNOSIS — I2699 Other pulmonary embolism without acute cor pulmonale: Secondary | ICD-10-CM

## 2011-12-30 DIAGNOSIS — Z951 Presence of aortocoronary bypass graft: Secondary | ICD-10-CM

## 2011-12-30 MED ORDER — ISOSORBIDE MONONITRATE ER 60 MG PO TB24: 60.0000 mg | ORAL_TABLET | Freq: Every day | ORAL | Status: DC

## 2011-12-30 MED ORDER — ISOSORBIDE MONONITRATE ER 120 MG PO TB24: ORAL_TABLET | ORAL | Status: DC

## 2011-12-30 NOTE — Assessment & Plan Note (Signed)
Patient has requested to be placed back on Imdur, which he was on prior to recent hospitalization. He states that he had been on this for several years, and that it seemed to help him. Of note, however, he denies any symptoms suggestive of exertional angina. He has not had to use any prn NTG, since his recent hospitalization. He did not know why he was  taken off the Imdur.

## 2011-12-30 NOTE — Assessment & Plan Note (Addendum)
Patient to remain on Coumadin anticoagulation indefinitely, with monitoring/management followed by Dr. Lysbeth Galas. ASA was discontinued at time of discharge, and patient is to remain on Plavix. Of note, he had completed a six-month course of Coumadin anticoagulation back in January of this year, for treatment of pulmonary embolism, which was diagnosed in August 2012. Of note, will schedule a followup 2-D echocardiogram in 3 months, prior to next OV, for reassessment of RV dysfunction and pulmonary hypertension, in the setting of acute pulmonary emboli.

## 2011-12-30 NOTE — Progress Notes (Signed)
HPI: patient presents for post hospital followup from [facility], following recent presentation with progressive DOE. CT angiogram of the chest revealed large, bilateral pulmonary emboli with possible right heart strain. 2-D echo: EF 55-60%, moderate RV dysfunction, and PASP 60 mmHg. 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 He was discharged on Plavix and Coumadin, and taken off ASA. He is being followed by Dr. Lysbeth Galas for his Coumadin, with most recent reported INR of 2.3.  Clinically, he reports much improved SOB. He denies any exertional CP.  No Known Allergies  Current Outpatient Prescriptions  Medication Sig Dispense Refill  . acetaminophen (TYLENOL) 325 MG tablet Take 650 mg by mouth every 4 (four) hours as needed. For pain      . chlorthalidone (HYGROTON) 25 MG tablet Take 1 tablet (25 mg total) by mouth daily.  30 tablet  6  . Cinnamon 500 MG TABS Take 1,000 mg by mouth daily.       . clopidogrel (PLAVIX) 75 MG tablet Take 1 tablet (75 mg total) by mouth daily.  30 tablet  6  . Coenzyme Q10 (COQ-10) 150 MG CAPS Take 1 capsule by mouth daily.        . fish oil-omega-3 fatty acids 1000 MG capsule Take 2 g by mouth daily.       . fluticasone (FLONASE) 50 MCG/ACT nasal spray Place 2 sprays into the nose daily.        . Fluticasone-Salmeterol (ADVAIR DISKUS) 250-50 MCG/DOSE AEPB Inhale 1 puff into the lungs 2 (two) times daily.        Marland Kitchen losartan (COZAAR) 100 MG tablet Take 1 tablet (100 mg total) by mouth daily.  30 tablet  6  . metFORMIN (GLUCOPHAGE-XR) 500 MG 24 hr tablet Take 500 mg by mouth daily with breakfast.        . metoprolol (LOPRESSOR) 50 MG tablet Take 75 mg by mouth 2 (two) times daily.      . montelukast (SINGULAIR) 10 MG tablet Take 10 mg by mouth daily.        . nitroGLYCERIN (NITROSTAT) 0.4 MG SL tablet Place 0.4 mg under the tongue every 5 (five) minutes as needed. For chest pain      . omeprazole-sodium  bicarbonate (ZEGERID) 40-1100 MG per capsule Take 1 capsule by mouth daily.        . Pitavastatin Calcium (LIVALO) 4 MG TABS Take 4 mg by mouth daily.       Marland Kitchen triamcinolone (NASACORT) 55 MCG/ACT nasal inhaler Place 2 sprays into the nose daily.       . verapamil (CALAN-SR) 240 MG CR tablet TAKE 1 TABLET (240 MG TOTAL) BY MOUTH DAILY.  30 tablet  6  . isosorbide mononitrate (IMDUR) 120 MG 24 hr tablet Take 1 1/2 tabs (180mg )  45 tablet  6  . warfarin (COUMADIN) 5 MG tablet Take 1 tablet (5 mg total) by mouth daily.  30 tablet  2    Past Medical History  Diagnosis Date  . CAD (coronary artery disease)     Status post non-ST elevation myocardial infarction January 2012 secondary to occluded branch of the circumflex coronary artery/medical treatment  . Myocardial infarction     s/p non-ST elevation MI and status-post 3 vessel coronary bypass grafting in 8/05.   . Presence of stent in left circumflex coronary artery     s/p Cypher stenting of the circumflex on Aug 2006  . Ejection fraction <  50%     normal EF, Ejection fraction 60% by bedside echocardiogram January 2013.  Marland Kitchen HTN (hypertension)     poorly controlled  . DM2 (diabetes mellitus, type 2)   . Dyslipidemia   . ACE inhibitor intolerance   . Asthma   . Recurrent aspiration bronchitis/pneumonia     Hx  . S/P CABG (coronary artery bypass graft) 2006  . Pulmonary embolism     July 2012 place on Coumadin    Past Surgical History  Procedure Date  . Coronary angioplasty with stent placement 2006  . Coronary artery bypass 2005     History   Social History  . Marital Status: Married    Spouse Name: N/A    Number of Children: N/A  . Years of Education: N/A   Occupational History  . Not on file.   Social History Main Topics  . Smoking status: Former Smoker -- 0.3 packs/day for 30 years    Types: Cigarettes    Quit date: 07/15/1972  . Smokeless tobacco: Current User    Types: Chew   Comment: chews 1/2 pack tobacco per day   . Alcohol Use: No  . Drug Use: No  . Sexually Active: Not on file   Other Topics Concern  . Not on file   Social History Narrative   Married, retired.    Social History Narrative   Married, retired.     Problem Relation Age of Onset  . Stroke Other     family Hx- also DM and Neop prostate    ROS: no nausea, vomiting; no fever, chills; no melena, hematochezia; no claudication  PHYSICAL EXAM: BP 156/88  Pulse 71  Ht 5\' 8"  (1.727 m)  Wt 191 lb 12.8 oz (87 kg)  BMI 29.16 kg/m2 GENERAL: 71 year-old male, obese; NAD HEENT: NCAT, PERRLA, EOMI; sclera clear; no xanthelasma NECK: palpable bilateral carotid pulses, no bruits; no JVD; no TM LUNGS: CTA bilaterally CARDIAC: RRR (S1, S2); no significant murmurs; no rubs or gallops ABDOMEN: Protuberant EXTREMETIES: no significant peripheral edema SKIN: warm/dry; no obvious rash/lesions MUSCULOSKELETAL: no joint deformity NEURO: no focal deficit; NL affect   EKG:    ASSESSMENT & PLAN:  Pulmonary emboli Patient to remain on Coumadin anticoagulation indefinitely, with monitoring/management followed by Dr. Lysbeth Galas. ASA was discontinued at time of discharge, and patient is to remain on Plavix. Of note, he had completed a six-month course of Coumadin anticoagulation back in January of this year, for treatment of pulmonary embolism, which was diagnosed in August 2012. Of note, will schedule a followup 2-D echocardiogram in 3 months, prior to next OV, for reassessment of RV dysfunction and pulmonary hypertension, in the setting of acute pulmonary emboli.   POSTSURGICAL AORTOCORONARY BYPASS STATUS Patient has requested to be placed back on Imdur, which he was on prior to recent hospitalization. He states that he had been on this for several years, and that it seemed to help him. Of note, however, he denies any symptoms suggestive of exertional angina. He has not had to use any prn NTG, since his recent hospitalization. He did not know why he  was  taken off the Imdur.    Gene Baldwin Racicot, PAC

## 2011-12-30 NOTE — Patient Instructions (Addendum)
   Echo - due in 3 months, just before next office visit Follow up in 3 months Imdur 180mg  daily (1 1/2 tabs of the 120mg  tablet)

## 2012-01-04 NOTE — Discharge Summary (Signed)
Patient seen and examined with PA  Counseling was provided regarding the current medical condition and included: . Diagnosis, impressions, prognosis, recommended diagnostic studies  . Risks and benefits of treatment options  . Instructions for management, treatment and/or follow-up care  . Importance of compliance with treatment, risk factor reduction  . Patient and/or family education    Time spent counseling was 15 minutes and recorded in the Problem List documeted by R. Hurman Horn , PA-C

## 2012-01-17 ENCOUNTER — Telehealth: Payer: Self-pay | Admitting: *Deleted

## 2012-01-17 NOTE — Telephone Encounter (Signed)
Received call from Roma Kayser, NP w/ Dr. Lysbeth Galas.  States they have been managing his coumadin for PE, but he has fluctuating INR's & wants to know if Xarelto would be a good choice for this patient.     Will forward to PA for review as GD is out of office till August.

## 2012-01-20 NOTE — Telephone Encounter (Signed)
My recommendation is for Dr Lysbeth Galas to review the risks/benefits of Xarelto Rx versus ongoing Coumadin anticoagulation with the patient, for whom he has been managing the treatment for pulmonary embolus.

## 2012-01-21 NOTE — Telephone Encounter (Signed)
Correction on PA name below.  [employer], PA.    Will fax note.  [phone]) Explained that Dr. Andee Lineman is currently out of office & message picked up by PA.

## 2012-02-16 ENCOUNTER — Other Ambulatory Visit: Payer: Self-pay | Admitting: Cardiology

## 2012-03-18 ENCOUNTER — Other Ambulatory Visit (INDEPENDENT_AMBULATORY_CARE_PROVIDER_SITE_OTHER): Payer: Medicare Other

## 2012-03-18 ENCOUNTER — Other Ambulatory Visit: Payer: Self-pay

## 2012-03-18 DIAGNOSIS — I2699 Other pulmonary embolism without acute cor pulmonale: Secondary | ICD-10-CM

## 2012-03-18 DIAGNOSIS — I2510 Atherosclerotic heart disease of native coronary artery without angina pectoris: Secondary | ICD-10-CM

## 2012-03-19 ENCOUNTER — Telehealth: Payer: Self-pay | Admitting: *Deleted

## 2012-03-19 ENCOUNTER — Other Ambulatory Visit: Payer: Self-pay | Admitting: Cardiology

## 2012-03-19 NOTE — Telephone Encounter (Signed)
Notes Recorded by Lesle Chris, LPN on 4/0/9811 at 5:18 PM Patient notified. Has follow up scheduled for 9/13 with Gene. ------  Notes Recorded by Lesle Chris, LPN on 03/15/4781 at 4:55 PM Left message to return call.

## 2012-03-19 NOTE — Telephone Encounter (Signed)
Message copied by Lesle Chris on Thu Mar 19, 2012  5:19 PM ------      Message from: Prescott Parma C      Created: Thu Mar 19, 2012  1:49 PM       NL RVF. No further workup

## 2012-03-27 ENCOUNTER — Encounter: Payer: Self-pay | Admitting: Physician Assistant

## 2012-03-27 ENCOUNTER — Ambulatory Visit (INDEPENDENT_AMBULATORY_CARE_PROVIDER_SITE_OTHER): Payer: Medicare Other | Admitting: Physician Assistant

## 2012-03-27 VITALS — BP 154/82 | HR 78 | Ht 68.0 in | Wt 191.0 lb

## 2012-03-27 DIAGNOSIS — I10 Essential (primary) hypertension: Secondary | ICD-10-CM

## 2012-03-27 DIAGNOSIS — Z951 Presence of aortocoronary bypass graft: Secondary | ICD-10-CM

## 2012-03-27 DIAGNOSIS — I2699 Other pulmonary embolism without acute cor pulmonale: Secondary | ICD-10-CM

## 2012-03-27 DIAGNOSIS — E785 Hyperlipidemia, unspecified: Secondary | ICD-10-CM

## 2012-03-27 MED ORDER — NITROGLYCERIN 0.4 MG SL SUBL: 0.4000 mg | SUBLINGUAL_TABLET | SUBLINGUAL | Status: DC | PRN

## 2012-03-27 NOTE — Assessment & Plan Note (Signed)
Patient advised to check his ambulatory BP at home, specifically in the early a.m. hours, prior to taking his usual medications. He is also to followup on this with Dr. Lysbeth Galas.

## 2012-03-27 NOTE — Assessment & Plan Note (Signed)
Patient to remain on Coumadin anticoagulation indefinitely, with monitoring/management followed by Dr. Lysbeth Galas. Patient also to continue on Plavix, as previously outlined. RV function normalized, by recent followup echocardiogram. No further workup indicated.

## 2012-03-27 NOTE — Assessment & Plan Note (Signed)
Quiescent on current medication regimen. Of note, patient nearly 2 years out status post NSTEMI, 1/12, treated medically. No current indication for stress test. Continue to monitor closely. Reassess clinical status in 4 months.

## 2012-03-27 NOTE — Patient Instructions (Addendum)
Your physician recommends that you schedule a follow-up appointment in: 4 months.  Your physician recommends that you continue on your current medications as directed. Please refer to the Current Medication list given to you today.  

## 2012-03-27 NOTE — Progress Notes (Signed)
Primary Cardiologist: Lewayne Bunting, MD   HPI: Patient presents for scheduled followup, with last OV this past June.  When last seen, I recommended a followup echocardiogram prior to today's visit, for reassessment of RV dysfunction with associated HTN, following diagnoses of recurrent bilateral pulmonary emboli. At that time, an echocardiogram indicated moderate RV dysfunction, with PASP 60 mmHg.  The recent followup echocardiogram indicated normalization of RV function. LVF remains normal (EF 60-65%), with grade 1 diastolic dysfunction.  Clinically, patient denies any interim exertional CP. He continues to have stable, mild DOE. He reports his INR is very well controlled, staying well within the 2-3.0 range, managed by Dr. Lysbeth Galas.  No Known Allergies  Current Outpatient Prescriptions  Medication Sig Dispense Refill  . acetaminophen (TYLENOL) 325 MG tablet Take 650 mg by mouth every 4 (four) hours as needed. For pain      . atorvastatin (LIPITOR) 40 MG tablet Take 40 mg by mouth daily.      . chlorthalidone (HYGROTON) 25 MG tablet Take 1 tablet (25 mg total) by mouth daily.  30 tablet  6  . Cinnamon 500 MG TABS Take 1,000 mg by mouth daily.       . clopidogrel (PLAVIX) 75 MG tablet TAKE 1 TABLET BY MOUTH DAILY  30 tablet  6  . Coenzyme Q10 (CO Q 10) 10 MG CAPS Take 200 mg by mouth daily.      Marland Kitchen esomeprazole (NEXIUM) 40 MG capsule Take 40 mg by mouth daily before breakfast.      . fish oil-omega-3 fatty acids 1000 MG capsule Take 2 g by mouth daily.       . fluticasone (FLONASE) 50 MCG/ACT nasal spray Place 2 sprays into the nose daily.        . Fluticasone-Salmeterol (ADVAIR DISKUS) 250-50 MCG/DOSE AEPB Inhale 1 puff into the lungs 2 (two) times daily.        . isosorbide mononitrate (IMDUR) 120 MG 24 hr tablet Take 1 1/2 tabs (180mg )  45 tablet  6  . losartan (COZAAR) 100 MG tablet TAKE 1 TABLET BY MOUTH ONCE A DAY  30 tablet  6  . metFORMIN (GLUCOPHAGE-XR) 500 MG 24 hr tablet Take 500 mg  by mouth daily with breakfast.        . metoprolol (LOPRESSOR) 50 MG tablet Take 75 mg by mouth 2 (two) times daily.      . montelukast (SINGULAIR) 10 MG tablet Take 10 mg by mouth daily.        . nitroGLYCERIN (NITROSTAT) 0.4 MG SL tablet Place 1 tablet (0.4 mg total) under the tongue every 5 (five) minutes as needed. For chest pain  25 tablet  1  . verapamil (CALAN-SR) 240 MG CR tablet TAKE 1 TABLET (240 MG TOTAL) BY MOUTH DAILY.  30 tablet  6  . warfarin (COUMADIN) 5 MG tablet Take 1 tablet (5 mg total) by mouth daily.  30 tablet  2  . zolpidem (AMBIEN) 5 MG tablet Take 10 mg by mouth at bedtime as needed.      Marland Kitchen DISCONTD: nitroGLYCERIN (NITROSTAT) 0.4 MG SL tablet Place 0.4 mg under the tongue every 5 (five) minutes as needed. For chest pain        Past Medical History  Diagnosis Date  . CAD (coronary artery disease)     Status post non-ST elevation myocardial infarction January 2012 secondary to occluded branch of the circumflex coronary artery/medical treatment  . Myocardial infarction     s/p non-ST elevation  MI and status-post 3 vessel coronary bypass grafting in 8/05.   . Presence of stent in left circumflex coronary artery     s/p Cypher stenting of the circumflex on Aug 2006  . Ejection fraction < 50%     normal EF, Ejection fraction 60% by bedside echocardiogram January 2013.  Marland Kitchen HTN (hypertension)     poorly controlled  . DM2 (diabetes mellitus, type 2)   . Dyslipidemia   . ACE inhibitor intolerance   . Asthma   . Recurrent aspiration bronchitis/pneumonia     Hx  . S/P CABG (coronary artery bypass graft) 2006  . Pulmonary embolism     July 2012 place on Coumadin    Past Surgical History  Procedure Date  . Coronary angioplasty with stent placement 2006  . Coronary artery bypass 2005     History   Social History  . Marital Status: Married    Spouse Name: N/A    Number of Children: N/A  . Years of Education: N/A   Occupational History  . Not on file.    Social History Main Topics  . Smoking status: Former Smoker -- 0.3 packs/day for 30 years    Types: Cigarettes    Quit date: 07/15/1972  . Smokeless tobacco: Current User    Types: Chew   Comment: chews 1/2 pack tobacco per day  . Alcohol Use: No  . Drug Use: No  . Sexually Active: Not on file   Other Topics Concern  . Not on file   Social History Narrative   Married, retired.     Family History  Problem Relation Age of Onset  . Stroke Other     family Hx- also DM and Neop prostate    ROS: no nausea, vomiting; no fever, chills; no melena, hematochezia; no claudication  PHYSICAL EXAM: BP 154/82  Pulse 78  Ht 5\' 8"  (1.727 m)  Wt 191 lb (86.637 kg)  BMI 29.04 kg/m2  SpO2 97% GENERAL: 71 year-old male, obese; NAD  HEENT: NCAT, PERRLA, EOMI; sclera clear; no xanthelasma  NECK: palpable bilateral carotid pulses, no bruits; no JVD; no TM  LUNGS: CTA bilaterally  CARDIAC: RRR (S1, S2); no significant murmurs; no rubs or gallops  ABDOMEN: Protuberant  EXTREMETIES: no significant peripheral edema  SKIN: warm/dry; no obvious rash/lesions  MUSCULOSKELETAL: no joint deformity  NEURO: no focal deficit; NL affect   EKG:    ASSESSMENT & PLAN:  Pulmonary emboli Patient to remain on Coumadin anticoagulation indefinitely, with monitoring/management followed by Dr. Lysbeth Galas. Patient also to continue on Plavix, as previously outlined. RV function normalized, by recent followup echocardiogram. No further workup indicated.   POSTSURGICAL AORTOCORONARY BYPASS STATUS Quiescent on current medication regimen. Of note, patient nearly 2 years out status post NSTEMI, 1/12, treated medically. No current indication for stress test. Continue to monitor closely. Reassess clinical status in 4 months.  HYPERTENSION Patient advised to check his ambulatory BP at home, specifically in the early a.m. hours, prior to taking his usual medications. He is also to followup on this with Dr.  Lysbeth Galas.  Dyslipidemia Will request most recent FLP from Dr. Joyce Copa office. Target LDL 70 or less, if feasible. Continue current dose Lipitor, pending further recommendations.    Gene Benen Weida, PAC

## 2012-03-27 NOTE — Assessment & Plan Note (Signed)
Will request most recent FLP from Dr. Joyce Copa office. Target LDL 70 or less, if feasible. Continue current dose Lipitor, pending further recommendations.

## 2012-03-31 ENCOUNTER — Ambulatory Visit: Payer: Medicare Other | Admitting: Cardiology

## 2012-04-16 ENCOUNTER — Other Ambulatory Visit: Payer: Self-pay | Admitting: Cardiology

## 2012-05-11 ENCOUNTER — Other Ambulatory Visit: Payer: Self-pay | Admitting: Cardiology

## 2012-05-22 ENCOUNTER — Other Ambulatory Visit: Payer: Self-pay | Admitting: Cardiology

## 2012-05-28 ENCOUNTER — Other Ambulatory Visit: Payer: Self-pay | Admitting: Cardiology

## 2012-06-22 ENCOUNTER — Other Ambulatory Visit: Payer: Self-pay | Admitting: Cardiology

## 2012-07-27 IMAGING — CT CT ANGIO CHEST
2 of 6 series · 18 of 46 positions shown · IV contrast (omnipaque)
Comparison: CT angio chest 01/28/2011

CLINICAL DATA: Evaluate for pulmonary emboli.  History of PE.
Taken off Coumadin and Somay.

CT ANGIOGRAPHY CHEST
TECHNIQUE: Multidetector CT imaging of the chest using the
standard protocol during bolus administration of intravenous
contrast. Multiplanar reconstructed images including MIPs were
obtained and reviewed to evaluate the vascular anatomy.
Contrast: 100mL OMNIPAQUE IOHEXOL 350 MG/ML SOLN CT chest
01/28/2011

[Series 6: pulm embolism 1.0 b25f thin · axial · 0.71mm/px · z∈[+1104,+1408]mm · 15 of 335 slices shown]
[im 15/335  lung]
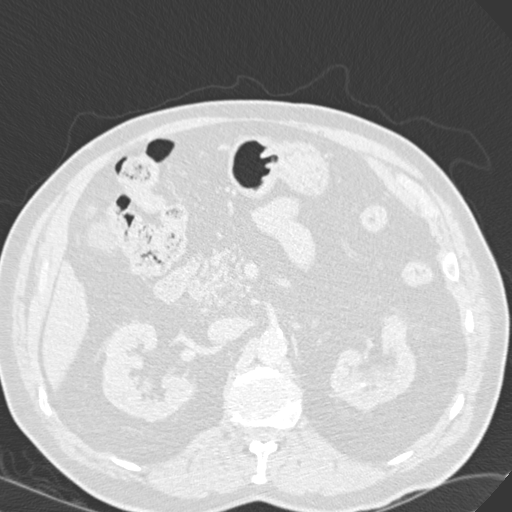
[im 44/335  soft-tissue]
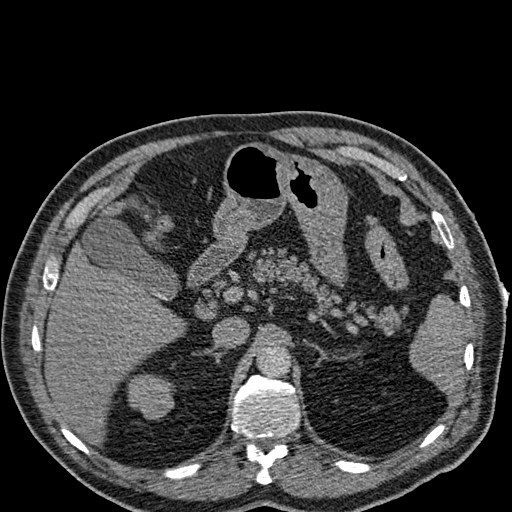
[im 59/335  lung]
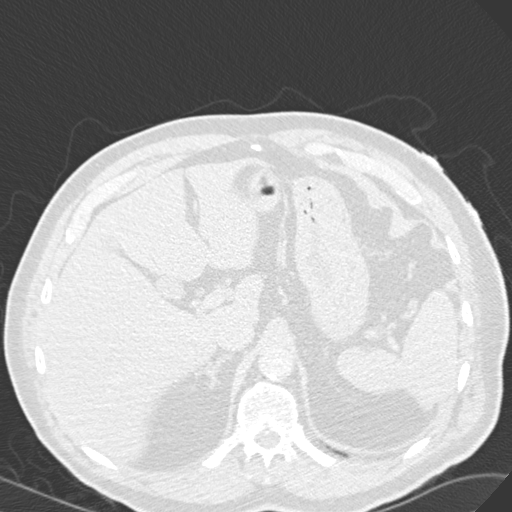
[im 88/335  soft-tissue]
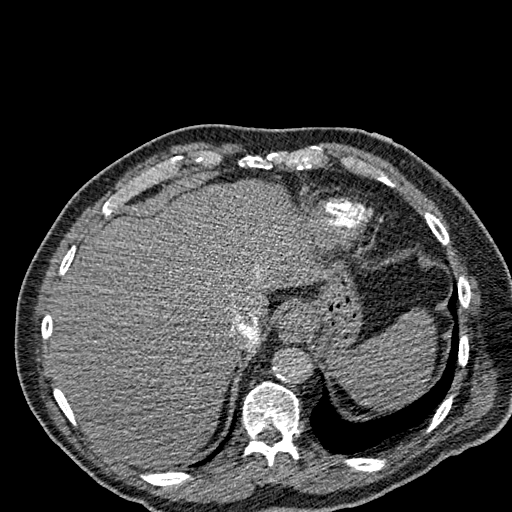
[im 102/335  lung]
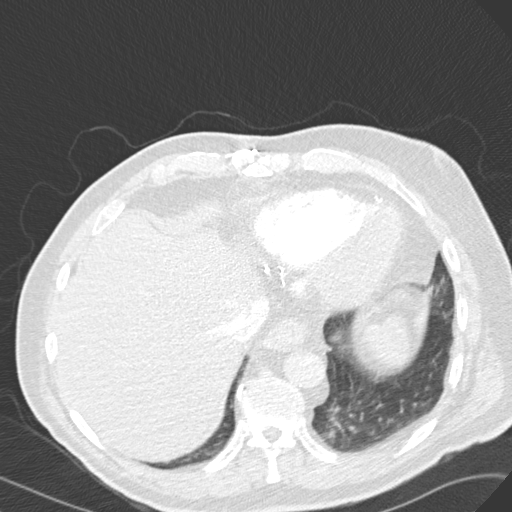
[im 131/335  soft-tissue]
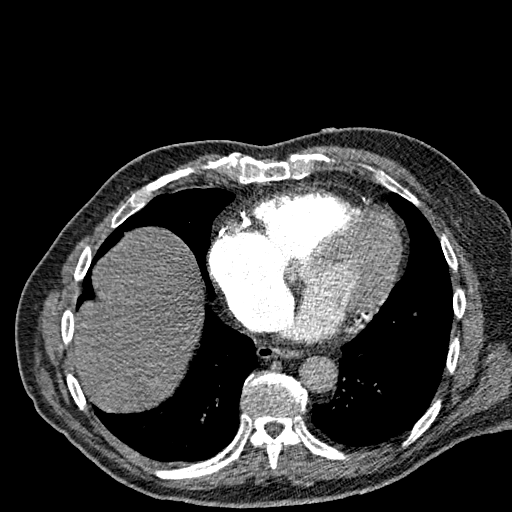
[im 146/335  lung]
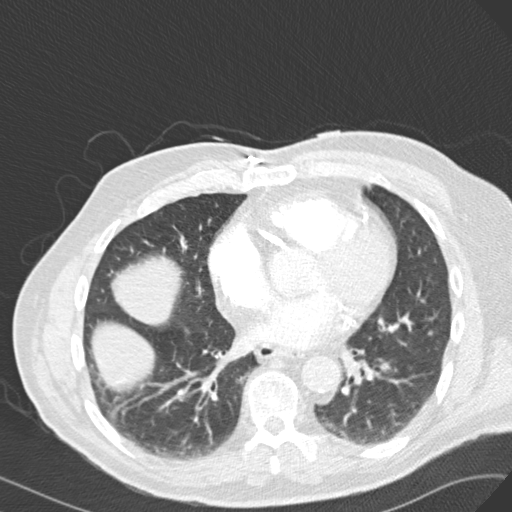
[im 175/335  soft-tissue]
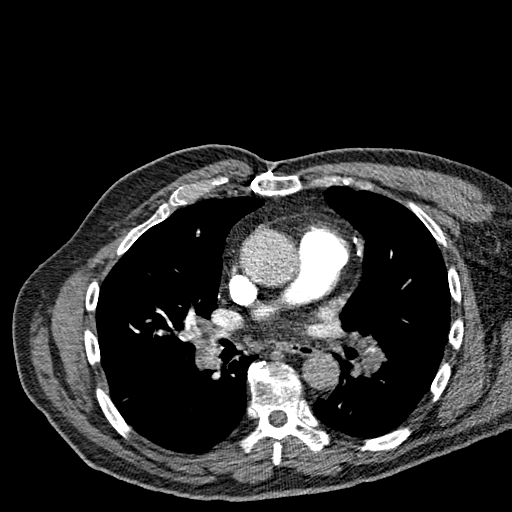
[im 189/335  lung]
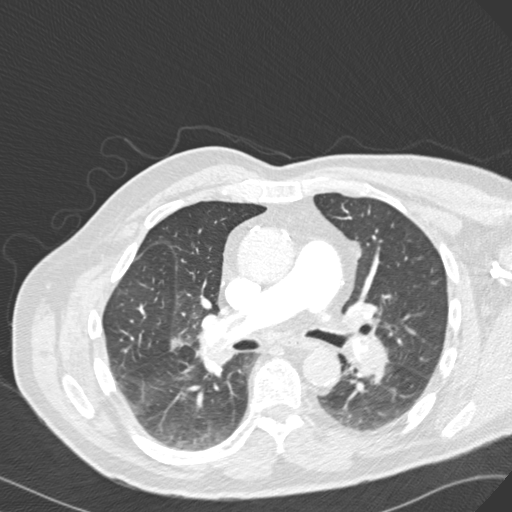
[im 204/335  soft-tissue]
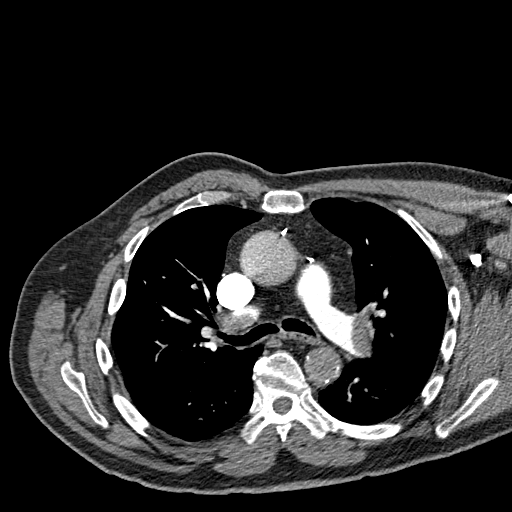
[im 233/335  lung]
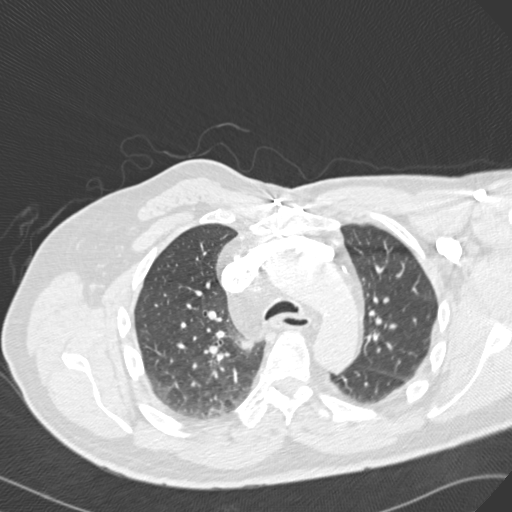
[im 247/335  soft-tissue]
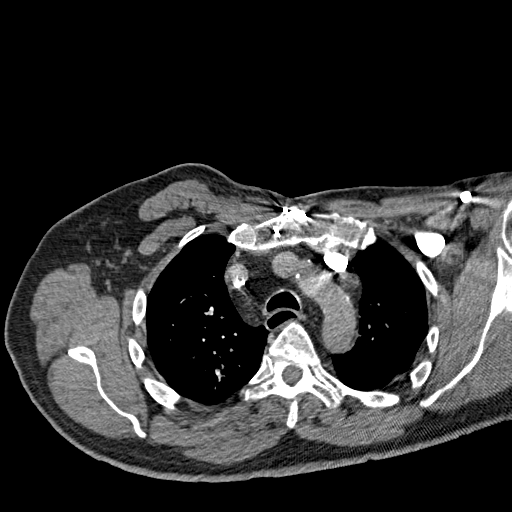
[im 276/335  lung]
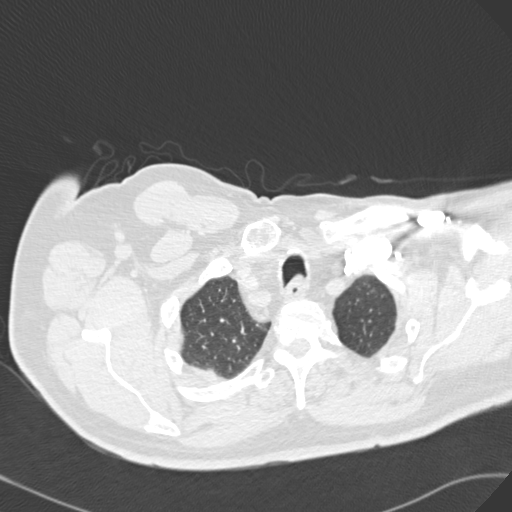
[im 291/335  soft-tissue]
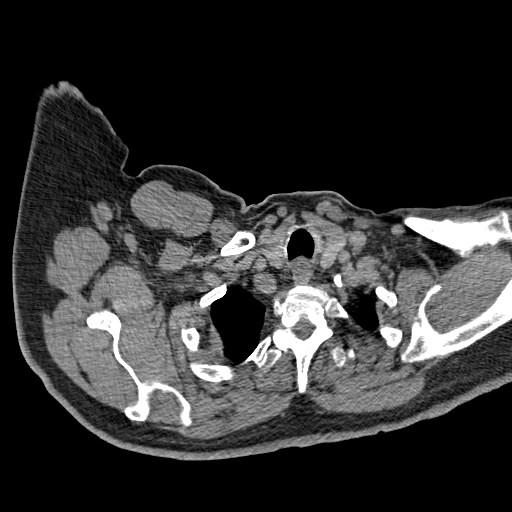
[im 320/335  lung]
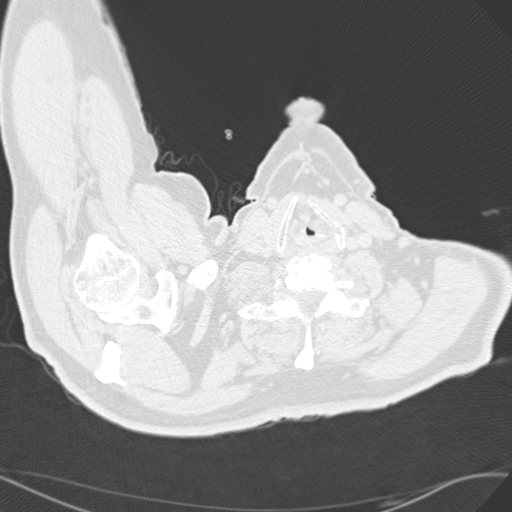

[Series 602: coronal · coronal · 0.71mm/px · 3 of 128 slices shown]
[im 32/128  soft-tissue]
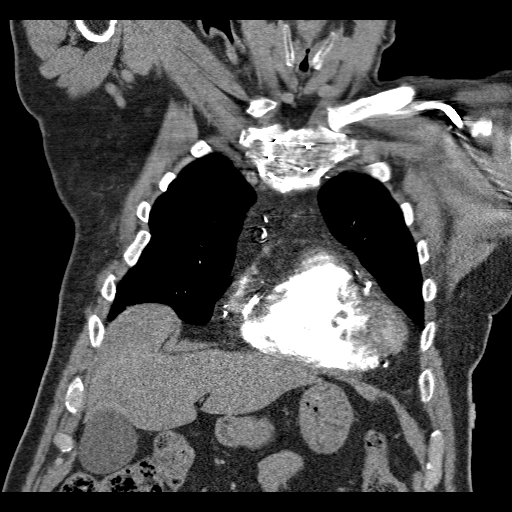
[im 64/128  soft-tissue]
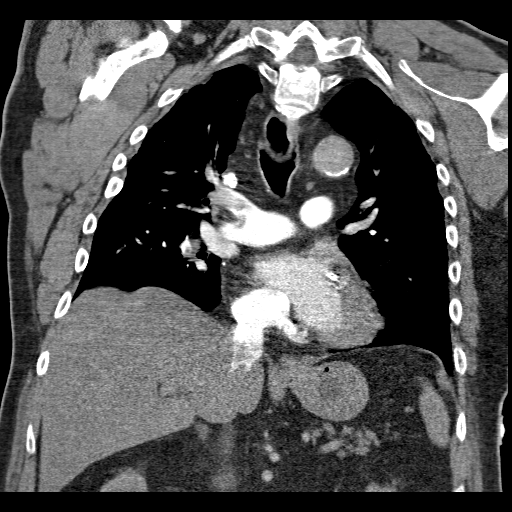
[im 96/128  soft-tissue]
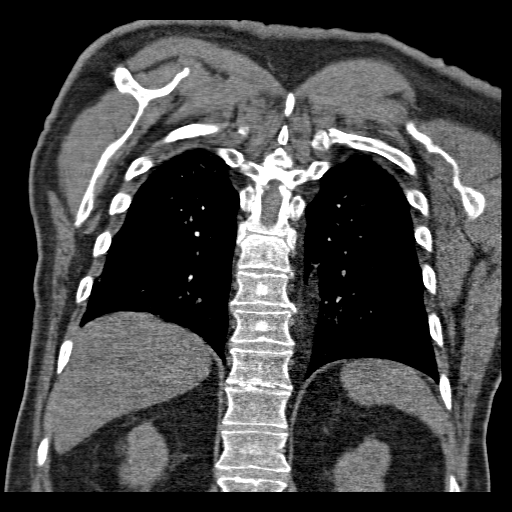

[18 of 46 positions shown; findings below may reference images not displayed]

FINDINGS: This study is positive for pulmonary emboli.  Clot is
seen within the distal main pulmonary arteries bilaterally, left
greater than right.  There is extensive clot in the lower lobe
branches to both lower lobes.  Clot also extends into the segmental
and subsegmental branches to both lower lobes bilaterally.  Clot is
seen within lobar and a segmental branch to the right upper lobe
and to lobar and segmental branches of the right middle lobe.  Clot
extends into the lobar branch to the left upper lobe.

Patient is status post median sternotomy for CABG, and cardiomegaly
is stable.

Negative for pleural or pericardial effusion. Negative for
lymphadenopathy. Normal caliber thoracic aorta.

There is small hiatal hernia.  There is some air within the upper
server upper thoracic esophagus.  Trachea and mainstem bronchi are
patent.

There is scattered areas of atelectasis, most prominent in the
right lower lobe.

A right middle lobe nodule measuring 8 x 5 mm on image number 54 of
the lung windows is stable compared to chest CT of 01/28/2011.
There is a tiny focus of atelectasis or airspace disease in the
perihilar right upper lobe on image number 51.

No acute or suspicious bony abnormalities.

Multiple small gallstones are seen within the gallbladder.  At
least two partially visualized large stones are seen in the left
kidney, and appears similar to the prior abdomen CT October 2008.
Stable partially visualized 2.9 cm laterally exophytic left upper
pole renal cyst.  Parapelvic cysts in the upper pole left kidney
versus dilatation of the calyces related to renal stones appears
similar to prior abdominal CT.
IMPRESSION: 1.  Large burden of bilateral pulmonary emboli as described above.
Interventricular straightening in the heart suggests the
possibility of right heart strain. Critical Value/emergent results
were called by telephone at the time of interpretation on
12/08/2011  at [DATE] p.m.  to  Dr. Bovell., who verbally
acknowledged these results.
2.  Stable 8 x 5 mm right middle lobe pulmonary nodule.  Unchanged
compared to chest CT January 2011. Given its stability over this
time, a follow-up noncontrast chest CT in 6-12 months is suggested.
3.  Scattered areas of atelectasis.  Small focal area of airspace
disease verse atelectasis in the right perihilar right upper lobe.
4.  Cardiomegaly and prior CABG.
5.  Left renal calculi, left renal cyst, and parapelvic cysts
versus dilatation of the upper pole calyces appear similar to
abdominal CT of [DATE].  Cholelithiasis.

## 2012-08-03 ENCOUNTER — Ambulatory Visit (INDEPENDENT_AMBULATORY_CARE_PROVIDER_SITE_OTHER): Payer: Medicare Other | Admitting: Cardiology

## 2012-08-03 ENCOUNTER — Encounter: Payer: Self-pay | Admitting: Cardiology

## 2012-08-03 VITALS — BP 125/76 | HR 76 | Ht 68.0 in | Wt 184.1 lb

## 2012-08-03 DIAGNOSIS — E785 Hyperlipidemia, unspecified: Secondary | ICD-10-CM

## 2012-08-03 DIAGNOSIS — I10 Essential (primary) hypertension: Secondary | ICD-10-CM

## 2012-08-03 DIAGNOSIS — I2510 Atherosclerotic heart disease of native coronary artery without angina pectoris: Secondary | ICD-10-CM

## 2012-08-03 DIAGNOSIS — I2699 Other pulmonary embolism without acute cor pulmonale: Secondary | ICD-10-CM

## 2012-08-03 NOTE — Assessment & Plan Note (Signed)
Well-controlled based on most recent assessment in November by Dr. Lysbeth Galas. Continue Lipitor.

## 2012-08-03 NOTE — Assessment & Plan Note (Signed)
Multivessel disease status post CABG with known graft disease, and status post DES to the circumflex in 2006. Reports no active angina symptoms. Continue medical therapy and observation for now.

## 2012-08-03 NOTE — Assessment & Plan Note (Signed)
On chronic anticoagulation, Coumadin followed by Dr. Lysbeth Galas with goal INR 2.0 to 3.0.

## 2012-08-03 NOTE — Patient Instructions (Signed)
Continue all current medications. Your physician wants you to follow up in: 6 months.  You will receive a reminder letter in the mail one-two months in advance.  If you don't receive a letter, please call our office to schedule the follow up appointment   

## 2012-08-03 NOTE — Assessment & Plan Note (Signed)
Good blood pressure control today. 

## 2012-08-03 NOTE — Progress Notes (Signed)
Clinical Summary Henry [name] is a medically complex 72 y.o.male presenting for followup. He is a former patient of Dr. Andee Lineman. Last office visit was in September 2013 with Mr. Shara Blazing. History reviewed. He states that he has done well, NYHA class II dyspnea and no active angina symptoms. Coumadin is followed by Dr. Lysbeth Galas. He denies any significant bleeding problems, and has been able to stay on Plavix as well.  Most recent echocardiogram in September 2013 showed mild LVH with LVEF 60-65% inferoseptal hypokinesis, grade 1 diastolic dysfunction, normal RV function with trivial tricuspid regurgitation. Lab work from Dr. Lysbeth Galas back in November reviewed finding potassium 3.5, AST 14, ALT 13, creatinine 1.1, cholesterol 136, triglycerides 199, HDL 35, LDL 61. He reports compliance with Lipitor.  No Known Allergies  Current Outpatient Prescriptions  Medication Sig Dispense Refill  . acetaminophen (TYLENOL) 325 MG tablet Take 650 mg by mouth every 4 (four) hours as needed. For pain      . atorvastatin (LIPITOR) 40 MG tablet Take 40 mg by mouth daily.      . chlorthalidone (HYGROTON) 25 MG tablet TAKE 1 TABLET BY MOUTH DAILY  30 tablet  6  . Cinnamon 500 MG TABS Take 1,000 mg by mouth daily.       . clopidogrel (PLAVIX) 75 MG tablet TAKE 1 TABLET BY MOUTH DAILY  30 tablet  6  . Coenzyme Q10 (CO Q 10) 10 MG CAPS Take 200 mg by mouth daily.      Marland Kitchen esomeprazole (NEXIUM) 40 MG capsule Take 40 mg by mouth daily before breakfast.      . fish oil-omega-3 fatty acids 1000 MG capsule Take 2 g by mouth daily.       . fluticasone (FLONASE) 50 MCG/ACT nasal spray Place 2 sprays into the nose daily.        . Fluticasone-Salmeterol (ADVAIR DISKUS) 250-50 MCG/DOSE AEPB Inhale 1 puff into the lungs 2 (two) times daily.        . isosorbide mononitrate (IMDUR) 120 MG 24 hr tablet Take 1 1/2 tabs (180mg )  45 tablet  6  . losartan (COZAAR) 100 MG tablet TAKE 1 TABLET BY MOUTH ONCE A DAY  30 tablet  6  . metFORMIN  (GLUCOPHAGE-XR) 500 MG 24 hr tablet Take 500 mg by mouth daily with breakfast.        . metoprolol (LOPRESSOR) 50 MG tablet Take 75 mg by mouth 2 (two) times daily.      . montelukast (SINGULAIR) 10 MG tablet Take 10 mg by mouth daily.        . nitroGLYCERIN (NITROSTAT) 0.4 MG SL tablet Place 1 tablet (0.4 mg total) under the tongue every 5 (five) minutes as needed. For chest pain  25 tablet  1  . verapamil (CALAN-SR) 240 MG CR tablet TAKE 1 TABLET (240 MG TOTAL) BY MOUTH DAILY.  30 tablet  3  . warfarin (COUMADIN) 5 MG tablet TAKE 1 TABLET BY MOUTH DAILY  30 tablet  2  . zolpidem (AMBIEN) 5 MG tablet Take 10 mg by mouth at bedtime as needed.        Past Medical History  Diagnosis Date  . Coronary atherosclerosis of native coronary artery     Multivessel s.p CABG, occluded SVG to OM and SVG to RCA, DES to circ 2006,  . NSTEMI (non-ST elevated myocardial infarction)     2005  . Essential hypertension, benign   . Type 2 diabetes mellitus   . Dyslipidemia   .  ACE inhibitor intolerance   . Asthma   . Pulmonary embolism     5/13    Past Surgical History  Procedure Date  . Coronary artery bypass graft 2005     LIMA to LAD, SVG to OM, SVG to RCA  . L4-l5 laminectomy     Social History Mr. Canning reports that he quit smoking about 40 years ago. His smoking use included Cigarettes. He has a 9 pack-year smoking history. His smokeless tobacco use includes Chew. Mr. Crehan reports that he does not drink alcohol.  Review of Systems Reporting some left-sided sciatic pain. Does have prior history of lumbar surgery. Has been lifting his great-granddaughter. No palpitations or dizziness. No syncope. Stable appetite. No reported melena or hematochezia. Otherwise negative.  Physical Examination Filed Vitals:   08/03/12 0802  BP: 125/76  Pulse: 76   Filed Weights   08/03/12 0802  Weight: 184 lb 1.9 oz (83.516 kg)   No acute distress. HEENT: Conjunctiva and lids normal, oropharynx clear  with poor dentition. Neck: Supple, no elevated JVP or carotid bruits, no thyromegaly. Lungs: Clear to auscultation, decreased breath sounds, nonlabored breathing at rest. Cardiac: Regular rate and rhythm, no S3 or significant systolic murmur, no pericardial rub. Abdomen: Soft, nontender, bowel sounds present. Extremities: No pitting edema, distal pulses 1-2+. Skin: Warm and dry. Musculoskeletal: No kyphosis. Neuropsychiatric: Alert and oriented x3, affect grossly appropriate.   Problem List and Plan   Coronary atherosclerosis of native coronary artery Multivessel disease status post CABG with known graft disease, and status post DES to the circumflex in 2006. Reports no active angina symptoms. Continue medical therapy and observation for now.  Dyslipidemia Well-controlled based on most recent assessment in November by Dr. Lysbeth Galas. Continue Lipitor.  Recurrent pulmonary emboli On chronic anticoagulation, Coumadin followed by Dr. Lysbeth Galas with goal INR 2.0 to 3.0.  Essential hypertension, benign Good blood pressure control today.    Jonelle Sidle, M.D., F.A.C.C.

## 2012-09-15 ENCOUNTER — Other Ambulatory Visit: Payer: Self-pay | Admitting: Cardiology

## 2012-09-21 ENCOUNTER — Other Ambulatory Visit: Payer: Self-pay | Admitting: Physician Assistant

## 2012-09-25 ENCOUNTER — Other Ambulatory Visit: Payer: Self-pay | Admitting: Physician Assistant

## 2012-09-28 ENCOUNTER — Other Ambulatory Visit: Payer: Self-pay | Admitting: Physician Assistant

## 2012-10-07 ENCOUNTER — Other Ambulatory Visit: Payer: Self-pay | Admitting: Physician Assistant

## 2012-11-15 ENCOUNTER — Other Ambulatory Visit: Payer: Self-pay | Admitting: Physician Assistant

## 2013-01-18 ENCOUNTER — Other Ambulatory Visit: Payer: Self-pay | Admitting: Physician Assistant

## 2013-01-20 ENCOUNTER — Other Ambulatory Visit: Payer: Self-pay | Admitting: Physician Assistant

## 2013-02-04 ENCOUNTER — Encounter: Payer: Self-pay | Admitting: Cardiology

## 2013-02-04 ENCOUNTER — Ambulatory Visit (INDEPENDENT_AMBULATORY_CARE_PROVIDER_SITE_OTHER): Payer: Medicare Other | Admitting: Cardiology

## 2013-02-04 VITALS — BP 138/70 | HR 77 | Ht 68.0 in | Wt 187.1 lb

## 2013-02-04 DIAGNOSIS — I10 Essential (primary) hypertension: Secondary | ICD-10-CM

## 2013-02-04 DIAGNOSIS — I2699 Other pulmonary embolism without acute cor pulmonale: Secondary | ICD-10-CM

## 2013-02-04 DIAGNOSIS — I2510 Atherosclerotic heart disease of native coronary artery without angina pectoris: Secondary | ICD-10-CM

## 2013-02-04 DIAGNOSIS — E785 Hyperlipidemia, unspecified: Secondary | ICD-10-CM

## 2013-02-04 MED ORDER — CLOPIDOGREL BISULFATE 75 MG PO TABS: 75.0000 mg | ORAL_TABLET | Freq: Every day | ORAL | Status: DC

## 2013-02-04 NOTE — Assessment & Plan Note (Signed)
Multivessel disease status post CABG, symptomatically stable on medical therapy with known graft disease. Continue observation for now. ECG reviewed and stable.

## 2013-02-04 NOTE — Assessment & Plan Note (Signed)
Keep followup with Dr. Lysbeth Galas. Continue Lipitor.

## 2013-02-04 NOTE — Progress Notes (Signed)
Clinical Summary Mr. Boutwell is a 72 y.o.male last seen in January. He reports no significant angina or progressive shortness of breath on current medical therapy. He continues to follow Coumadin with Dr. Joyce Copa office, reports no major bleeding episodes but easy bruising, also on Plavix concurrently.  ECG today shows sinus rhythm with NSST changes.  Most recent echocardiogram in September 2013 showed mild LVH with LVEF 60-65% inferoseptal hypokinesis, grade 1 diastolic dysfunction, normal RV function with trivial tricuspid regurgitation.  Lab work followed by Dr. Joyce Copa office, patient reports compliance with current treatment. He has been having trouble with his asthma over the hot, humid summer months.   No Known Allergies  Current Outpatient Prescriptions  Medication Sig Dispense Refill  . acetaminophen (TYLENOL) 325 MG tablet Take 650 mg by mouth every 4 (four) hours as needed. For pain      . atorvastatin (LIPITOR) 40 MG tablet Take 40 mg by mouth daily.      . chlorthalidone (HYGROTON) 25 MG tablet TAKE 1 TABLET BY MOUTH DAILY  30 tablet  6  . Cinnamon 500 MG TABS Take 1,000 mg by mouth daily.       . clopidogrel (PLAVIX) 75 MG tablet Take 1 tablet (75 mg total) by mouth daily.  30 tablet  6  . Coenzyme Q10 (CO Q 10) 10 MG CAPS Take 200 mg by mouth daily.      Marland Kitchen esomeprazole (NEXIUM) 40 MG capsule Take 40 mg by mouth daily before breakfast.      . fish oil-omega-3 fatty acids 1000 MG capsule Take 2 g by mouth daily.       . fluticasone (FLONASE) 50 MCG/ACT nasal spray Place 2 sprays into the nose daily.        . Fluticasone-Salmeterol (ADVAIR DISKUS) 250-50 MCG/DOSE AEPB Inhale 1 puff into the lungs 2 (two) times daily.        . isosorbide mononitrate (IMDUR) 120 MG 24 hr tablet Take 1 1/2 tabs (180mg )  45 tablet  6  . losartan (COZAAR) 100 MG tablet TAKE 1 TABLET BY MOUTH ONCE A DAY  30 tablet  6  . metFORMIN (GLUCOPHAGE-XR) 500 MG 24 hr tablet Take 500 mg by mouth daily with  breakfast.        . metoprolol (LOPRESSOR) 50 MG tablet Take 75 mg by mouth 2 (two) times daily.      . metoprolol (LOPRESSOR) 50 MG tablet TAKE 1 AND 1/2 TABLETS BY MOUTH TWICE DAILY  90 tablet  6  . montelukast (SINGULAIR) 10 MG tablet Take 10 mg by mouth daily.        . nitroGLYCERIN (NITROSTAT) 0.4 MG SL tablet Place 1 tablet (0.4 mg total) under the tongue every 5 (five) minutes as needed. For chest pain  25 tablet  1  . verapamil (CALAN-SR) 240 MG CR tablet TAKE 1 TABLET (240 MG TOTAL) BY MOUTH DAILY.  30 tablet  5  . warfarin (COUMADIN) 5 MG tablet TAKE 1 TABLET BY MOUTH DAILY  30 tablet  2  . zolpidem (AMBIEN) 5 MG tablet Take 10 mg by mouth at bedtime as needed.       No current facility-administered medications for this visit.    Past Medical History  Diagnosis Date  . Coronary atherosclerosis of native coronary artery     Multivessel s.p CABG, occluded SVG to OM and SVG to RCA, DES to circ 2006,  . NSTEMI (non-ST elevated myocardial infarction)     2005  .  Essential hypertension, benign   . Type 2 diabetes mellitus   . Dyslipidemia   . ACE inhibitor intolerance   . Asthma   . Pulmonary embolism     5/13    Past Surgical History  Procedure Laterality Date  . Coronary artery bypass graft  2005     LIMA to LAD, SVG to OM, SVG to RCA  . L4-l5 laminectomy      Social History Mr. Nunnery reports that he quit smoking about 40 years ago. His smoking use included Cigarettes. He has a 9 pack-year smoking history. His smokeless tobacco use includes Chew. Mr. Deis reports that he does not drink alcohol.  Review of Systems No palpitations or syncope. Stable appetite. No claudication.  Physical Examination Filed Vitals:   02/04/13 1314  BP: 138/70  Pulse: 77   Filed Weights   02/04/13 1314  Weight: 187 lb 1.9 oz (84.877 kg)    Comfortable at rest. HEENT: Conjunctiva and lids normal, oropharynx clear with poor dentition.  Neck: Supple, no elevated JVP or carotid  bruits, no thyromegaly.  Lungs: Clear to auscultation, decreased breath sounds, nonlabored breathing at rest.  Cardiac: Regular rate and rhythm, no S3 or significant systolic murmur, no pericardial rub.  Abdomen: Soft, nontender, bowel sounds present.  Extremities: No pitting edema, distal pulses 1-2+.  Skin: Warm and dry.  Musculoskeletal: No kyphosis.  Neuropsychiatric: Alert and oriented x3, affect grossly appropriate.  Problem List and Plan   Coronary atherosclerosis of native coronary artery Multivessel disease status post CABG, symptomatically stable on medical therapy with known graft disease. Continue observation for now. ECG reviewed and stable.  Essential hypertension, benign No change to current regimen.  Recurrent pulmonary emboli On chronic Coumadin, managed by Dr. Lysbeth Galas.  Dyslipidemia Keep followup with Dr. Lysbeth Galas. Continue Lipitor.    Jonelle Sidle, M.D., F.A.C.C.

## 2013-02-04 NOTE — Assessment & Plan Note (Signed)
No change to current regimen. 

## 2013-02-04 NOTE — Assessment & Plan Note (Signed)
On chronic Coumadin, managed by Dr. Lysbeth Galas.

## 2013-02-04 NOTE — Patient Instructions (Addendum)

## 2013-03-25 ENCOUNTER — Other Ambulatory Visit: Payer: Self-pay | Admitting: Physician Assistant

## 2013-04-20 ENCOUNTER — Other Ambulatory Visit: Payer: Self-pay | Admitting: Physician Assistant

## 2013-05-20 ENCOUNTER — Other Ambulatory Visit: Payer: Self-pay | Admitting: Physician Assistant

## 2013-06-17 ENCOUNTER — Other Ambulatory Visit: Payer: Self-pay | Admitting: Physician Assistant

## 2013-08-12 ENCOUNTER — Encounter: Payer: Self-pay | Admitting: Cardiology

## 2013-08-12 ENCOUNTER — Ambulatory Visit (INDEPENDENT_AMBULATORY_CARE_PROVIDER_SITE_OTHER): Payer: Medicare Other | Admitting: Cardiology

## 2013-08-12 VITALS — BP 158/86 | HR 75 | Ht 68.0 in | Wt 187.0 lb

## 2013-08-12 DIAGNOSIS — E785 Hyperlipidemia, unspecified: Secondary | ICD-10-CM

## 2013-08-12 DIAGNOSIS — I2699 Other pulmonary embolism without acute cor pulmonale: Secondary | ICD-10-CM

## 2013-08-12 DIAGNOSIS — I2510 Atherosclerotic heart disease of native coronary artery without angina pectoris: Secondary | ICD-10-CM

## 2013-08-12 NOTE — Assessment & Plan Note (Signed)
Multivessel disease status post CABG, known graft disease with stable angina symptoms. Continue current regimen and observation.

## 2013-08-12 NOTE — Assessment & Plan Note (Signed)
Continues on Coumadin, followed by Dr. Edrick Oh.

## 2013-08-12 NOTE — Patient Instructions (Signed)
Continue all current medications. Your physician wants you to follow up in: 6 months.  You will receive a reminder letter in the mail one-two months in advance.  If you don't receive a letter, please call our office to schedule the follow up appointment   

## 2013-08-12 NOTE — Progress Notes (Signed)
Clinical Summary Henry [name] is a 73 y.o.male last seen in July 2014. He reports stable angina symptoms, no progressive shortness of breath.  Most recent echocardiogram in September 2013 showed mild LVH with LVEF 60-65% inferoseptal hypokinesis, grade 1 diastolic dysfunction, normal RV function with trivial tricuspid regurgitation.  He continues to follow with Dr. Edrick Oh for Coumadin management. Reports no significant bleeding problems.   No Known Allergies  Current Outpatient Prescriptions  Medication Sig Dispense Refill  . acetaminophen (TYLENOL) 325 MG tablet Take 650 mg by mouth every 4 (four) hours as needed. For pain      . atorvastatin (LIPITOR) 40 MG tablet Take 40 mg by mouth daily.      . chlorthalidone (HYGROTON) 25 MG tablet TAKE 1 TABLET BY MOUTH DAILY  30 tablet  3  . Cinnamon 500 MG TABS Take 1,000 mg by mouth daily.       . clopidogrel (PLAVIX) 75 MG tablet Take 1 tablet (75 mg total) by mouth daily.  30 tablet  6  . Coenzyme Q10 (CO Q 10) 10 MG CAPS Take 200 mg by mouth daily.      Marland Kitchen esomeprazole (NEXIUM) 40 MG capsule Take 40 mg by mouth daily before breakfast.      . fish oil-omega-3 fatty acids 1000 MG capsule Take 2 g by mouth daily.       . fluticasone (FLONASE) 50 MCG/ACT nasal spray Place 2 sprays into the nose daily.        . Fluticasone-Salmeterol (ADVAIR DISKUS) 250-50 MCG/DOSE AEPB Inhale 1 puff into the lungs 2 (two) times daily.        . isosorbide mononitrate (IMDUR) 120 MG 24 hr tablet Take 1 1/2 tabs (180mg )  45 tablet  6  . losartan (COZAAR) 100 MG tablet TAKE 1 TABLET BY MOUTH ONCE A DAY  30 tablet  6  . metFORMIN (GLUCOPHAGE-XR) 500 MG 24 hr tablet Take 500 mg by mouth daily with breakfast.        . metoprolol (LOPRESSOR) 50 MG tablet TAKE 1 AND 1/2 TABLETS BY MOUTH TWICE DAILY  90 tablet  6  . montelukast (SINGULAIR) 10 MG tablet Take 10 mg by mouth daily.        . nitroGLYCERIN (NITROSTAT) 0.4 MG SL tablet Place 1 tablet (0.4 mg total) under the  tongue every 5 (five) minutes as needed. For chest pain  25 tablet  1  . verapamil (CALAN-SR) 240 MG CR tablet TAKE 1 TABLET (240 MG TOTAL) BY MOUTH DAILY.  30 tablet  6  . warfarin (COUMADIN) 5 MG tablet TAKE 1 TABLET BY MOUTH DAILY  30 tablet  2  . zolpidem (AMBIEN) 5 MG tablet Take 5 mg by mouth at bedtime as needed.        No current facility-administered medications for this visit.    Past Medical History  Diagnosis Date  . Coronary atherosclerosis of native coronary artery     Multivessel s.p CABG, occluded SVG to OM and SVG to RCA, DES to circ 2006,  . NSTEMI (non-ST elevated myocardial infarction)     2005  . Essential hypertension, benign   . Type 2 diabetes mellitus   . Dyslipidemia   . ACE inhibitor intolerance   . Asthma   . Pulmonary embolism     5/13    Past Surgical History  Procedure Laterality Date  . Coronary artery bypass graft  2005     LIMA to LAD, SVG to OM, SVG  to RCA  . L4-l5 laminectomy      Social History Mr. Willers reports that he quit smoking about 41 years ago. His smoking use included Cigarettes. He has a 9 pack-year smoking history. His smokeless tobacco use includes Chew. Mr. Klemens reports that he does not drink alcohol.  Review of Systems No cough or hemoptysis. Has had some recent sinus trouble. Stable appetite. Chronic hip pain limits his regular exercise. Otherwise negative.  Physical Examination Filed Vitals:   08/12/13 1450  BP: 158/86  Pulse: 75   Filed Weights   08/12/13 1450  Weight: 187 lb (84.823 kg)    Comfortable at rest.  HEENT: Conjunctiva and lids normal, oropharynx clear with poor dentition.  Neck: Supple, no elevated JVP or carotid bruits, no thyromegaly.  Lungs: Clear to auscultation, decreased breath sounds, nonlabored breathing at rest.  Cardiac: Regular rate and rhythm, no S3 or significant systolic murmur, no pericardial rub.  Abdomen: Soft, nontender, bowel sounds present.  Extremities: No pitting edema,  distal pulses 1-2+.  Skin: Warm and dry.  Musculoskeletal: No kyphosis.  Neuropsychiatric: Alert and oriented x3, affect grossly appropriate.   Problem List and Plan   Coronary atherosclerosis of native coronary artery Multivessel disease status post CABG, known graft disease with stable angina symptoms. Continue current regimen and observation.  Recurrent pulmonary emboli Continues on Coumadin, followed by Dr. Edrick Oh.  Dyslipidemia On Lipitor and omega-3 supplements. Followed by Dr. Edrick Oh.    Satira Sark, M.D., F.A.C.C.

## 2013-08-12 NOTE — Assessment & Plan Note (Signed)
On Lipitor and omega-3 supplements. Followed by Dr. Edrick Oh.

## 2013-08-19 ENCOUNTER — Other Ambulatory Visit: Payer: Self-pay | Admitting: Physician Assistant

## 2013-09-08 ENCOUNTER — Other Ambulatory Visit: Payer: Self-pay | Admitting: Cardiology

## 2013-10-18 ENCOUNTER — Other Ambulatory Visit: Payer: Self-pay | Admitting: Cardiology

## 2013-10-25 ENCOUNTER — Other Ambulatory Visit: Payer: Self-pay | Admitting: *Deleted

## 2013-10-25 MED ORDER — VERAPAMIL HCL ER 240 MG PO TBCR: 240.0000 mg | EXTENDED_RELEASE_TABLET | Freq: Every day | ORAL | Status: DC

## 2013-11-15 ENCOUNTER — Other Ambulatory Visit: Payer: Self-pay | Admitting: *Deleted

## 2013-11-15 MED ORDER — LOSARTAN POTASSIUM 100 MG PO TABS: 100.0000 mg | ORAL_TABLET | Freq: Every day | ORAL | Status: DC

## 2013-12-15 ENCOUNTER — Other Ambulatory Visit: Payer: Self-pay | Admitting: Cardiology

## 2013-12-17 ENCOUNTER — Encounter (HOSPITAL_COMMUNITY): Payer: Self-pay | Admitting: Emergency Medicine

## 2013-12-17 ENCOUNTER — Inpatient Hospital Stay (HOSPITAL_COMMUNITY)
Admission: EM | Admit: 2013-12-17 | Discharge: 2013-12-20 | DRG: 920 | Disposition: A | Discharge status: Home / Self Care | Payer: Medicare Other | Attending: Internal Medicine | Admitting: Internal Medicine

## 2013-12-17 ENCOUNTER — Emergency Department (HOSPITAL_COMMUNITY): Payer: Medicare Other

## 2013-12-17 DIAGNOSIS — J45909 Unspecified asthma, uncomplicated: Secondary | ICD-10-CM | POA: Diagnosis present

## 2013-12-17 DIAGNOSIS — I2699 Other pulmonary embolism without acute cor pulmonale: Secondary | ICD-10-CM

## 2013-12-17 DIAGNOSIS — I252 Old myocardial infarction: Secondary | ICD-10-CM

## 2013-12-17 DIAGNOSIS — E785 Hyperlipidemia, unspecified: Secondary | ICD-10-CM | POA: Diagnosis present

## 2013-12-17 DIAGNOSIS — IMO0002 Reserved for concepts with insufficient information to code with codable children: Principal | ICD-10-CM | POA: Diagnosis present

## 2013-12-17 DIAGNOSIS — K922 Gastrointestinal hemorrhage, unspecified: Secondary | ICD-10-CM | POA: Diagnosis present

## 2013-12-17 DIAGNOSIS — I214 Non-ST elevation (NSTEMI) myocardial infarction: Secondary | ICD-10-CM

## 2013-12-17 DIAGNOSIS — R0989 Other specified symptoms and signs involving the circulatory and respiratory systems: Secondary | ICD-10-CM | POA: Diagnosis present

## 2013-12-17 DIAGNOSIS — I248 Other forms of acute ischemic heart disease: Secondary | ICD-10-CM | POA: Diagnosis present

## 2013-12-17 DIAGNOSIS — R760 Raised antibody titer: Secondary | ICD-10-CM | POA: Diagnosis present

## 2013-12-17 DIAGNOSIS — D649 Anemia, unspecified: Secondary | ICD-10-CM | POA: Diagnosis present

## 2013-12-17 DIAGNOSIS — Z7902 Long term (current) use of antithrombotics/antiplatelets: Secondary | ICD-10-CM

## 2013-12-17 DIAGNOSIS — R710 Precipitous drop in hematocrit: Secondary | ICD-10-CM | POA: Diagnosis present

## 2013-12-17 DIAGNOSIS — R778 Other specified abnormalities of plasma proteins: Secondary | ICD-10-CM

## 2013-12-17 DIAGNOSIS — I2510 Atherosclerotic heart disease of native coronary artery without angina pectoris: Secondary | ICD-10-CM | POA: Diagnosis present

## 2013-12-17 DIAGNOSIS — R911 Solitary pulmonary nodule: Secondary | ICD-10-CM | POA: Diagnosis present

## 2013-12-17 DIAGNOSIS — K219 Gastro-esophageal reflux disease without esophagitis: Secondary | ICD-10-CM | POA: Diagnosis present

## 2013-12-17 DIAGNOSIS — D131 Benign neoplasm of stomach: Secondary | ICD-10-CM | POA: Diagnosis present

## 2013-12-17 DIAGNOSIS — R7989 Other specified abnormal findings of blood chemistry: Secondary | ICD-10-CM

## 2013-12-17 DIAGNOSIS — Z833 Family history of diabetes mellitus: Secondary | ICD-10-CM

## 2013-12-17 DIAGNOSIS — R894 Abnormal immunological findings in specimens from other organs, systems and tissues: Secondary | ICD-10-CM | POA: Diagnosis present

## 2013-12-17 DIAGNOSIS — Z951 Presence of aortocoronary bypass graft: Secondary | ICD-10-CM

## 2013-12-17 DIAGNOSIS — I2489 Other forms of acute ischemic heart disease: Secondary | ICD-10-CM | POA: Diagnosis present

## 2013-12-17 DIAGNOSIS — Z79899 Other long term (current) drug therapy: Secondary | ICD-10-CM

## 2013-12-17 DIAGNOSIS — I208 Other forms of angina pectoris: Secondary | ICD-10-CM | POA: Diagnosis present

## 2013-12-17 DIAGNOSIS — Y849 Medical procedure, unspecified as the cause of abnormal reaction of the patient, or of later complication, without mention of misadventure at the time of the procedure: Secondary | ICD-10-CM | POA: Diagnosis present

## 2013-12-17 DIAGNOSIS — Z7901 Long term (current) use of anticoagulants: Secondary | ICD-10-CM

## 2013-12-17 DIAGNOSIS — I2089 Other forms of angina pectoris: Secondary | ICD-10-CM | POA: Diagnosis present

## 2013-12-17 DIAGNOSIS — E119 Type 2 diabetes mellitus without complications: Secondary | ICD-10-CM | POA: Diagnosis present

## 2013-12-17 DIAGNOSIS — N183 Chronic kidney disease, stage 3 unspecified: Secondary | ICD-10-CM | POA: Diagnosis present

## 2013-12-17 DIAGNOSIS — F17200 Nicotine dependence, unspecified, uncomplicated: Secondary | ICD-10-CM | POA: Diagnosis present

## 2013-12-17 DIAGNOSIS — R0609 Other forms of dyspnea: Secondary | ICD-10-CM | POA: Diagnosis present

## 2013-12-17 DIAGNOSIS — I2782 Chronic pulmonary embolism: Secondary | ICD-10-CM | POA: Diagnosis present

## 2013-12-17 DIAGNOSIS — Z86711 Personal history of pulmonary embolism: Secondary | ICD-10-CM | POA: Diagnosis present

## 2013-12-17 DIAGNOSIS — I10 Essential (primary) hypertension: Secondary | ICD-10-CM | POA: Diagnosis present

## 2013-12-17 DIAGNOSIS — I129 Hypertensive chronic kidney disease with stage 1 through stage 4 chronic kidney disease, or unspecified chronic kidney disease: Secondary | ICD-10-CM | POA: Diagnosis present

## 2013-12-17 HISTORY — DX: Calculus of gallbladder without cholecystitis without obstruction: K80.20

## 2013-12-17 HISTORY — DX: Other pulmonary embolism without acute cor pulmonale: I26.99

## 2013-12-17 HISTORY — DX: Gastro-esophageal reflux disease without esophagitis: K21.9

## 2013-12-17 HISTORY — DX: Solitary pulmonary nodule: R91.1

## 2013-12-17 HISTORY — DX: Pulmonary hypertension, unspecified: I27.20

## 2013-12-17 HISTORY — DX: Raised antibody titer: R76.0

## 2013-12-17 LAB — POC OCCULT BLOOD, ED: Fecal Occult Bld: POSITIVE — AB

## 2013-12-17 LAB — BASIC METABOLIC PANEL
BUN: 44 mg/dL — AB (ref 6–23)
CALCIUM: 8.6 mg/dL (ref 8.4–10.5)
CO2: 22 mEq/L (ref 19–32)
Chloride: 107 mEq/L (ref 96–112)
Creatinine, Ser: 1.34 mg/dL (ref 0.50–1.35)
GFR calc Af Amer: 59 mL/min — ABNORMAL LOW (ref >90)
GFR, EST NON AFRICAN AMERICAN: 51 mL/min — AB (ref >90)
GLUCOSE: 119 mg/dL — AB (ref 70–99)
Potassium: 4.2 mEq/L (ref 3.7–5.3)
Sodium: 143 mEq/L (ref 137–147)

## 2013-12-17 LAB — PROTIME-INR
INR: 1.79 — AB (ref 0.00–1.49)
Prothrombin Time: 20.3 seconds — ABNORMAL HIGH (ref 11.6–15.2)

## 2013-12-17 LAB — I-STAT TROPONIN, ED: Troponin i, poc: 0.08 ng/mL (ref 0.00–0.08)

## 2013-12-17 LAB — CBC
HEMATOCRIT: 24.1 % — AB (ref 39.0–52.0)
HEMOGLOBIN: 7.9 g/dL — AB (ref 13.0–17.0)
MCH: 29.4 pg (ref 26.0–34.0)
MCHC: 32.8 g/dL (ref 30.0–36.0)
MCV: 89.6 fL (ref 78.0–100.0)
Platelets: 280 10*3/uL (ref 150–400)
RBC: 2.69 MIL/uL — ABNORMAL LOW (ref 4.22–5.81)
RDW: 16.2 % — ABNORMAL HIGH (ref 11.5–15.5)
WBC: 10.9 10*3/uL — ABNORMAL HIGH (ref 4.0–10.5)

## 2013-12-17 LAB — PRO B NATRIURETIC PEPTIDE: Pro B Natriuretic peptide (BNP): 232.9 pg/mL — ABNORMAL HIGH (ref 0–125)

## 2013-12-17 LAB — PREPARE RBC (CROSSMATCH)

## 2013-12-17 NOTE — ED Notes (Signed)
Patient presents with chest pain since about 1830.  Stated it started like pressure on the left side that radiated to his left shoulder. +sweating at one time.  Used 2 of his NTG which looked old per EMS  EMS administered 1 NTG and the pain went away.  No ASA given per EMS - patient stated his MD told him not to take any ASA since he takes Coumadin and Plavix

## 2013-12-17 NOTE — ED Notes (Addendum)
EMS reported BP 127/84, CBG 133, 4 liter via [place] sats 100%,  Original call was at 1902 with CP with SOB  Stated he had gotten SOB when he walked to the kitchen.

## 2013-12-17 NOTE — ED Provider Notes (Signed)
CSN: [id number]     Arrival date & time 12/17/13  2045 History   First MD Initiated Contact with Patient 12/17/13 2119     Chief Complaint  Patient presents with  . Chest Pain     (Consider location/radiation/quality/duration/timing/severity/associated sxs/prior Treatment) Patient is a 73 y.o. male presenting with chest pain. The history is provided by the patient. No language interpreter was used.  Chest Pain Pain location:  L chest Pain quality: pressure and tightness   Pain radiates to:  L arm Pain radiates to the back: no   Pain severity:  Moderate Onset quality:  Sudden Duration:  30 minutes Progression:  Improving Chronicity:  New Associated symptoms: abdominal pain, shortness of breath and weakness   Risk factors: coronary artery disease, diabetes mellitus and prior DVT/PE     Past Medical History  Diagnosis Date  . Coronary atherosclerosis of native coronary artery     Multivessel s.p CABG, occluded SVG to OM and SVG to RCA, DES to circ 2006,  . NSTEMI (non-ST elevated myocardial infarction)     2005  . Essential hypertension, benign   . Type 2 diabetes mellitus   . Dyslipidemia   . ACE inhibitor intolerance   . Asthma   . Pulmonary embolism     5/13   Past Surgical History  Procedure Laterality Date  . Coronary artery bypass graft  2005     LIMA to LAD, SVG to OM, SVG to RCA  . L4-l5 laminectomy    . Colonoscopy    . Endooscopy     Family History  Problem Relation Age of Onset  . Stroke    . Diabetes Mellitus II     History  Substance Use Topics  . Smoking status: Former Smoker -- 0.30 packs/day for 30 years    Types: Cigarettes    Quit date: 07/15/1972  . Smokeless tobacco: Current User    Types: Chew     Comment: chews 1/2 pack tobacco per day  . Alcohol Use: No    Review of Systems  Respiratory: Positive for chest tightness, shortness of breath and wheezing.   Cardiovascular: Positive for chest pain. Negative for leg swelling.   Gastrointestinal: Positive for abdominal pain.  Musculoskeletal: Positive for arthralgias.  Neurological: Positive for weakness.  All other systems reviewed and are negative.     Allergies  Review of patient's allergies indicates no known allergies.  Home Medications   Prior to Admission medications   Medication Sig Start Date End Date Taking? Authorizing Provider  acetaminophen (TYLENOL) 325 MG tablet Take 650 mg by mouth every 4 (four) hours as needed. For pain    Historical Provider, MD  atorvastatin (LIPITOR) 40 MG tablet Take 40 mg by mouth daily.    Historical Provider, MD  chlorthalidone (HYGROTON) 25 MG tablet TAKE 1 TABLET BY MOUTH DAILY 10/18/13   Satira Sark, MD  Cinnamon 500 MG TABS Take 1,000 mg by mouth daily.     Historical Provider, MD  clopidogrel (PLAVIX) 75 MG tablet TAKE 1 TABLET (75 MG TOTAL) BY MOUTH DAILY. 09/08/13   Satira Sark, MD  Coenzyme Q10 (CO Q 10) 10 MG CAPS Take 200 mg by mouth daily.    Historical Provider, MD  esomeprazole (NEXIUM) 40 MG capsule Take 40 mg by mouth daily before breakfast.    Historical Provider, MD  fish oil-omega-3 fatty acids 1000 MG capsule Take 2 g by mouth daily.     Historical Provider, MD  fluticasone Asencion Islam)  50 MCG/ACT nasal spray Place 2 sprays into the nose daily.      Historical Provider, MD  Fluticasone-Salmeterol (ADVAIR DISKUS) 250-50 MCG/DOSE AEPB Inhale 1 puff into the lungs 2 (two) times daily.      Historical Provider, MD  isosorbide mononitrate (IMDUR) 120 MG 24 hr tablet Take 1 1/2 tabs (180mg ) 12/30/11   Donney Dice, PA-C  isosorbide mononitrate (IMDUR) 60 MG 24 hr tablet TAKE 3 TABLETS BY MOUTH ONCE DAILY 12/15/13   Satira Sark, MD  losartan (COZAAR) 100 MG tablet Take 1 tablet (100 mg total) by mouth daily. 11/15/13   Satira Sark, MD  metFORMIN (GLUCOPHAGE-XR) 500 MG 24 hr tablet Take 500 mg by mouth daily with breakfast.      Historical Provider, MD  metoprolol (LOPRESSOR) 50 MG tablet TAKE  1 AND 1/2 TABLETS BY MOUTH TWICE DAILY 08/19/13   Satira Sark, MD  montelukast (SINGULAIR) 10 MG tablet Take 10 mg by mouth daily.      Historical Provider, MD  nitroGLYCERIN (NITROSTAT) 0.4 MG SL tablet Place 1 tablet (0.4 mg total) under the tongue every 5 (five) minutes as needed. For chest pain 03/27/12   Donney Dice, PA-C  verapamil (CALAN-SR) 240 MG CR tablet Take 1 tablet (240 mg total) by mouth daily. 10/25/13   Satira Sark, MD  warfarin (COUMADIN) 5 MG tablet TAKE 1 TABLET BY MOUTH DAILY 09/28/12   Donney Dice, PA-C  zolpidem (AMBIEN) 5 MG tablet Take 5 mg by mouth at bedtime as needed.     Historical Provider, MD   BP 92/76  Pulse 83  Temp(Src) 98 F (36.7 C) (Oral)  Resp 19  Ht 5\' 8"  (1.727 m)  Wt 186 lb (84.369 kg)  BMI 28.29 kg/m2  SpO2 95% Physical Exam  Nursing note and vitals reviewed. Constitutional: He is oriented to person, place, and time. He appears well-developed and well-nourished.  HENT:  Head: Normocephalic.  Eyes: Pupils are equal, round, and reactive to light.  Neck: Normal range of motion.  Cardiovascular: Normal rate and regular rhythm.   Pulmonary/Chest: Effort normal.  Abdominal: Soft. Bowel sounds are normal.  Musculoskeletal: He exhibits no edema and no tenderness.  Neurological: He is alert and oriented to person, place, and time.  Skin: Skin is warm and dry.  Psychiatric: He has a normal mood and affect.    ED Course  Procedures (including critical care time) Labs Review Labs Reviewed  CBC - Abnormal; Notable for the following:    WBC 10.9 (*)    RBC 2.69 (*)    Hemoglobin 7.9 (*)    HCT 24.1 (*)    RDW 16.2 (*)    All other components within normal limits  BASIC METABOLIC PANEL  PRO B NATRIURETIC PEPTIDE  I-STAT TROPOININ, ED    Imaging Review Dg Chest Port 1 View  12/17/2013   CLINICAL DATA:  Chest pain.  EXAM: PORTABLE CHEST - 1 VIEW  COMPARISON:  11/24/2010.  FINDINGS: The heart is borderline enlarged but stable.  There is tortuosity and calcification of the thoracic aorta. Stable surgical changes related to previous bypass surgery. The lungs are clear. No pleural effusion or pneumothorax. The bony thorax is intact.  IMPRESSION: No acute cardiopulmonary findings.   Electronically Signed   By: Kalman Jewels M.D.   On: 12/17/2013 21:37     EKG Interpretation None     Patient reports fatigue and weakness since yesterday, with increase in dyspnea with exertion.  This evening, developed chest pressure and increased shortness of breath.  Took two of his NTG without relief.  EMS administered their NTG with significant relief of chest discomfort.  Patient reports prior history of PE with similar presentation.  Not tachycardic or hypoxic. Initial troponin 0.08.   BNP 232.9. No effusion, edema, or pneumonia on CXR.  5 point drop in hemoglobin with positive hemocult.  Patient with recent colonoscopy with polyp removal.  Will admit for symptomatic anemia. MDM   Final diagnoses:  None    Symptomatic anemia.    Norman Herrlich, NP 12/18/13 [phone]

## 2013-12-17 NOTE — ED Provider Notes (Addendum)
Medical screening examination/treatment/procedure(s) were conducted as a shared visit with non-physician practitioner(s) and myself.  I personally evaluated the patient during the encounter.   EKG Interpretation   Date/Time:  Friday December 17 2013 20:50:28 EDT Ventricular Rate:  80 PR Interval:  162 QRS Duration: 78 QT Interval:  372 QTC Calculation: 429 R Axis:   46 Text Interpretation:  Sinus rhythm Low voltage, precordial leads Consider  anterior infarct Minimal ST depression Compared to previous tracing ST  depression in Inferior leads NOW PRESENT T wave inversion Anterior leads  NO LONGER PRESENT Confirmed by [facility]  MD, Jenny Reichmann ([id number]) on 12/17/2013  10:31:40 PM     Suspect symptomatic anemia with drop in hemoglobin of 5 points now less than 8 with black stools the last few months on iron recent colonoscopy has been on Lovenox as well as Coumadin and Plavix with 2 days of shortness of breath generalized fatigue lightheadedness and tonight had a transient spell of some chest pressure which is now resolved EKG is slight ST depression inferior leads compared with prior feel transfusion reasonable due to risk of demand ischemia.  CRITICAL CARE Performed by: Babette Relic Total critical care time: 54min Critical care time was exclusive of separately billable procedures and treating other patients. Critical care was necessary to treat or prevent imminent or life-threatening deterioration. Critical care was time spent personally by me on the following activities: development of treatment plan with patient and/or surrogate as well as nursing, discussions with consultants, evaluation of patient's response to treatment, examination of patient, obtaining history from patient or surrogate, ordering and performing treatments and interventions, ordering and review of laboratory studies, ordering and review of radiographic studies, pulse oximetry and re-evaluation of patient's condition.  Babette Relic,  MD 12/18/13 [place], MD 12/18/13 [phone]

## 2013-12-18 ENCOUNTER — Encounter (HOSPITAL_COMMUNITY): Payer: Self-pay | Admitting: *Deleted

## 2013-12-18 DIAGNOSIS — R760 Raised antibody titer: Secondary | ICD-10-CM | POA: Diagnosis present

## 2013-12-18 DIAGNOSIS — N183 Chronic kidney disease, stage 3 unspecified: Secondary | ICD-10-CM | POA: Diagnosis present

## 2013-12-18 DIAGNOSIS — R0989 Other specified symptoms and signs involving the circulatory and respiratory systems: Secondary | ICD-10-CM | POA: Insufficient documentation

## 2013-12-18 DIAGNOSIS — R911 Solitary pulmonary nodule: Secondary | ICD-10-CM | POA: Diagnosis present

## 2013-12-18 DIAGNOSIS — I209 Angina pectoris, unspecified: Secondary | ICD-10-CM

## 2013-12-18 DIAGNOSIS — K922 Gastrointestinal hemorrhage, unspecified: Secondary | ICD-10-CM

## 2013-12-18 DIAGNOSIS — Z86711 Personal history of pulmonary embolism: Secondary | ICD-10-CM | POA: Diagnosis present

## 2013-12-18 DIAGNOSIS — R894 Abnormal immunological findings in specimens from other organs, systems and tissues: Secondary | ICD-10-CM

## 2013-12-18 DIAGNOSIS — D649 Anemia, unspecified: Secondary | ICD-10-CM

## 2013-12-18 LAB — CBC
HCT: 27.3 % — ABNORMAL LOW (ref 39.0–52.0)
Hemoglobin: 8.8 g/dL — ABNORMAL LOW (ref 13.0–17.0)
MCH: 28.9 pg (ref 26.0–34.0)
MCHC: 32.2 g/dL (ref 30.0–36.0)
MCV: 89.8 fL (ref 78.0–100.0)
PLATELETS: 276 10*3/uL (ref 150–400)
RBC: 3.04 MIL/uL — ABNORMAL LOW (ref 4.22–5.81)
RDW: 15.7 % — AB (ref 11.5–15.5)
WBC: 9.7 10*3/uL (ref 4.0–10.5)

## 2013-12-18 LAB — HEMOGLOBIN AND HEMATOCRIT, BLOOD
HCT: 30.8 % — ABNORMAL LOW (ref 39.0–52.0)
HCT: 28.1 % — ABNORMAL LOW (ref 39.0–52.0)
Hemoglobin: 10 g/dL — ABNORMAL LOW (ref 13.0–17.0)
Hemoglobin: 9.1 g/dL — ABNORMAL LOW (ref 13.0–17.0)

## 2013-12-18 LAB — GLUCOSE, CAPILLARY
GLUCOSE-CAPILLARY: 106 mg/dL — AB (ref 70–99)
GLUCOSE-CAPILLARY: 114 mg/dL — AB (ref 70–99)
Glucose-Capillary: 133 mg/dL — ABNORMAL HIGH (ref 70–99)
Glucose-Capillary: 124 mg/dL — ABNORMAL HIGH (ref 70–99)

## 2013-12-18 LAB — ABO/RH: ABO/RH(D): A NEG

## 2013-12-18 LAB — PROTIME-INR
INR: 1.84 — AB (ref 0.00–1.49)
Prothrombin Time: 20.7 seconds — ABNORMAL HIGH (ref 11.6–15.2)

## 2013-12-18 LAB — TROPONIN I: TROPONIN I: 0.43 ng/mL — AB (ref <0.30)

## 2013-12-18 LAB — HEMOGLOBIN A1C
Hgb A1c MFr Bld: 6.1 % — ABNORMAL HIGH (ref <5.7)
Mean Plasma Glucose: 128 mg/dL — ABNORMAL HIGH (ref <117)

## 2013-12-18 LAB — SURGICAL PCR SCREEN
MRSA, PCR: NEGATIVE
STAPHYLOCOCCUS AUREUS: POSITIVE — AB

## 2013-12-18 MED ORDER — DIPHENHYDRAMINE HCL 25 MG PO CAPS: 25.0000 mg | ORAL_CAPSULE | Freq: Once | ORAL | Status: DC

## 2013-12-18 MED ORDER — OXYCODONE HCL 5 MG PO TABS
5.0000 mg | ORAL_TABLET | ORAL | Status: DC | PRN
  Administered 2013-12-18 – 2013-12-20 (×5): 5 mg via ORAL
  Filled 2013-12-18 (×5): qty 1

## 2013-12-18 MED ORDER — INSULIN ASPART 100 UNIT/ML ~~LOC~~ SOLN
0.0000 [IU] | Freq: Three times a day (TID) | SUBCUTANEOUS | Status: DC
  Administered 2013-12-18 – 2013-12-20 (×2): 1 [IU] via SUBCUTANEOUS

## 2013-12-18 MED ORDER — METOPROLOL TARTRATE 100 MG PO TABS
100.0000 mg | ORAL_TABLET | Freq: Two times a day (BID) | ORAL | Status: DC
  Administered 2013-12-18 – 2013-12-20 (×5): 100 mg via ORAL
  Filled 2013-12-18 (×8): qty 1

## 2013-12-18 MED ORDER — ACETAMINOPHEN 325 MG PO TABS: 650.0000 mg | ORAL_TABLET | ORAL | Status: DC | PRN

## 2013-12-18 MED ORDER — MORPHINE SULFATE 2 MG/ML IJ SOLN: 2.0000 mg | INTRAMUSCULAR | Status: DC | PRN

## 2013-12-18 MED ORDER — ZOLPIDEM TARTRATE 5 MG PO TABS
5.0000 mg | ORAL_TABLET | Freq: Every day | ORAL | Status: DC
  Administered 2013-12-18 – 2013-12-19 (×2): 5 mg via ORAL
  Filled 2013-12-18 (×2): qty 1

## 2013-12-18 MED ORDER — ACETAMINOPHEN 650 MG RE SUPP: 650.0000 mg | Freq: Four times a day (QID) | RECTAL | Status: DC | PRN

## 2013-12-18 MED ORDER — PANTOPRAZOLE SODIUM 40 MG PO TBEC
40.0000 mg | DELAYED_RELEASE_TABLET | Freq: Every day | ORAL | Status: DC
  Administered 2013-12-18 – 2013-12-20 (×3): 40 mg via ORAL
  Filled 2013-12-18 (×3): qty 1

## 2013-12-18 MED ORDER — ACETAMINOPHEN 325 MG PO TABS: 650.0000 mg | ORAL_TABLET | Freq: Four times a day (QID) | ORAL | Status: DC | PRN

## 2013-12-18 MED ORDER — ISOSORBIDE MONONITRATE ER 60 MG PO TB24
180.0000 mg | ORAL_TABLET | Freq: Every day | ORAL | Status: DC
  Administered 2013-12-19 – 2013-12-20 (×2): 180 mg via ORAL
  Filled 2013-12-18 (×2): qty 3

## 2013-12-18 MED ORDER — ONDANSETRON HCL 4 MG PO TABS: 4.0000 mg | ORAL_TABLET | Freq: Four times a day (QID) | ORAL | Status: DC | PRN

## 2013-12-18 MED ORDER — CO Q 10 10 MG PO CAPS: 200.0000 mg | ORAL_CAPSULE | Freq: Every day | ORAL | Status: DC

## 2013-12-18 MED ORDER — SODIUM CHLORIDE 0.9 % IV SOLN
INTRAVENOUS | Status: DC
  Administered 2013-12-18: 04:00:00 via INTRAVENOUS

## 2013-12-18 MED ORDER — MOMETASONE FURO-FORMOTEROL FUM 100-5 MCG/ACT IN AERO
2.0000 | INHALATION_SPRAY | Freq: Two times a day (BID) | RESPIRATORY_TRACT | Status: DC
  Administered 2013-12-18 – 2013-12-20 (×5): 2 via RESPIRATORY_TRACT
  Filled 2013-12-18: qty 8.8

## 2013-12-18 MED ORDER — ISOSORBIDE MONONITRATE ER 60 MG PO TB24
120.0000 mg | ORAL_TABLET | Freq: Every day | ORAL | Status: DC
  Administered 2013-12-18: 120 mg via ORAL
  Filled 2013-12-18: qty 2

## 2013-12-18 MED ORDER — INSULIN ASPART 100 UNIT/ML ~~LOC~~ SOLN: 0.0000 [IU] | Freq: Every day | SUBCUTANEOUS | Status: DC

## 2013-12-18 MED ORDER — NITROGLYCERIN 0.4 MG SL SUBL: 0.4000 mg | SUBLINGUAL_TABLET | SUBLINGUAL | Status: DC | PRN

## 2013-12-18 MED ORDER — VERAPAMIL HCL ER 240 MG PO TBCR
240.0000 mg | EXTENDED_RELEASE_TABLET | Freq: Every day | ORAL | Status: DC
  Administered 2013-12-18 – 2013-12-20 (×3): 240 mg via ORAL
  Filled 2013-12-18 (×3): qty 1

## 2013-12-18 MED ORDER — FUROSEMIDE 10 MG/ML IJ SOLN: 20.0000 mg | Freq: Once | INTRAMUSCULAR | Status: DC

## 2013-12-18 MED ORDER — SODIUM CHLORIDE 0.9 % IJ SOLN
3.0000 mL | Freq: Two times a day (BID) | INTRAMUSCULAR | Status: DC
  Administered 2013-12-18 – 2013-12-19 (×5): 3 mL via INTRAVENOUS

## 2013-12-18 MED ORDER — CHLORTHALIDONE 25 MG PO TABS
25.0000 mg | ORAL_TABLET | Freq: Every day | ORAL | Status: DC
  Administered 2013-12-18 – 2013-12-20 (×3): 25 mg via ORAL
  Filled 2013-12-18 (×3): qty 1

## 2013-12-18 MED ORDER — ONDANSETRON HCL 4 MG/2ML IJ SOLN: 4.0000 mg | Freq: Four times a day (QID) | INTRAMUSCULAR | Status: DC | PRN

## 2013-12-18 MED ORDER — ATORVASTATIN CALCIUM 40 MG PO TABS
40.0000 mg | ORAL_TABLET | Freq: Every day | ORAL | Status: DC
  Administered 2013-12-18 – 2013-12-20 (×4): 40 mg via ORAL
  Filled 2013-12-18 (×3): qty 1

## 2013-12-18 NOTE — H&P (Signed)
[facility]                                                                                    Patient Demographics  Henry [name], is a 73 y.o. male  CSN: [id number]  MRN: [id number]  DOB - 08-Dec-1940  Admit Date - 12/17/2013  Outpatient Primary MD for the patient is Sherrie Mustache, MD   With History of -  Past Medical History  Diagnosis Date  . Coronary atherosclerosis of native coronary artery     Multivessel s.p CABG, occluded SVG to OM and SVG to RCA, DES to circ 2006,  . NSTEMI (non-ST elevated myocardial infarction)     2005  . Essential hypertension, benign   . Type 2 diabetes mellitus   . Dyslipidemia   . ACE inhibitor intolerance   . Asthma   . Pulmonary embolism     5/13      Past Surgical History  Procedure Laterality Date  . Coronary artery bypass graft  2005     LIMA to LAD, SVG to OM, SVG to RCA  . L4-l5 laminectomy    . Colonoscopy    . Endooscopy      in for   Chief Complaint  Patient presents with  . Chest Pain     HPI  Henry [name]  is a 73 y.o. male, with past medical history significant for coronary artery disease with history of non-STEMI in 2005, diabetes mellitus, dyslipidemia presenting with 2 days history of chest pain radiating to the left arm and shortness of breath nausea but no vomiting his stool is guaiac positive. The patient had recently had EGD and colonoscopy on 526 and he didn't, [place] by Dr. Britta Mccreedy and multiple polyps were removed and snared from the gastric mucosa. O. patient denies dizziness or loss of consciousness. There was a major drop in his hemoglobin and the patient was admitted for observation probably GI consult for repeat EGD   Review of Systems    In addition to the HPI above,  No Fever-chills, No Headache, No changes with Vision or hearing, No problems swallowing food or Liquids,, No Abdominal pain, No Nausea or Vommitting, Bowel movements are regular, No Blood in stool or  Urine, No dysuria, No new skin rashes or bruises, No new joints pains-aches,  No new weakness, tingling, numbness in any extremity, No recent weight gain or loss, No polyuria, polydypsia or polyphagia, No significant Mental Stressors.  A full 10 point Review of Systems was done, except as stated above, all other Review of Systems were negative.   Social History History  Substance Use Topics  . Smoking status: Former Smoker -- 0.30 packs/day for 30 years    Types: Cigarettes    Quit date: 07/15/1972  . Smokeless tobacco: Current User    Types: Chew     Comment: chews 1/2 pack tobacco per day  . Alcohol Use: No     Family History Family History  Problem Relation Age of Onset  . Stroke    . Diabetes Mellitus II       Prior to Admission medications   Medication Sig Start Date End Date Taking?  Authorizing Provider  acetaminophen (TYLENOL) 325 MG tablet Take 650 mg by mouth every 4 (four) hours as needed. For pain   Yes Historical Provider, MD  atorvastatin (LIPITOR) 40 MG tablet Take 40 mg by mouth daily.   Yes Historical Provider, MD  chlorthalidone (HYGROTON) 25 MG tablet Take 25 mg by mouth daily.   Yes Historical Provider, MD  clopidogrel (PLAVIX) 75 MG tablet Take 75 mg by mouth daily with breakfast.   Yes Historical Provider, MD  Coenzyme Q10 (CO Q 10) 10 MG CAPS Take 200 mg by mouth daily.   Yes Historical Provider, MD  esomeprazole (NEXIUM) 40 MG capsule Take 40 mg by mouth daily before breakfast.   Yes Historical Provider, MD  fish oil-omega-3 fatty acids 1000 MG capsule Take 2 g by mouth daily.    Yes Historical Provider, MD  fluticasone (FLONASE) 50 MCG/ACT nasal spray Place 2 sprays into the nose daily.     Yes Historical Provider, MD  Fluticasone-Salmeterol (ADVAIR DISKUS) 250-50 MCG/DOSE AEPB Inhale 1 puff into the lungs 2 (two) times daily.     Yes Historical Provider, MD  Iron Combinations (IRON COMPLEX PO) Take 1 capsule by mouth 2 (two) times daily.   Yes  Historical Provider, MD  isosorbide mononitrate (IMDUR) 120 MG 24 hr tablet Take 180 mg by mouth daily.   Yes Historical Provider, MD  metFORMIN (GLUCOPHAGE) 500 MG tablet Take 500 mg by mouth 2 (two) times daily with a meal.   Yes Historical Provider, MD  metoprolol (LOPRESSOR) 100 MG tablet Take 100 mg by mouth 2 (two) times daily.   Yes Historical Provider, MD  mometasone (NASONEX) 50 MCG/ACT nasal spray Place 2 sprays into the nose daily.   Yes Historical Provider, MD  nitroGLYCERIN (NITROSTAT) 0.4 MG SL tablet Place 1 tablet (0.4 mg total) under the tongue every 5 (five) minutes as needed. For chest pain 03/27/12  Yes Burna Forts Serpe, PA-C  verapamil (CALAN-SR) 240 MG CR tablet Take 1 tablet (240 mg total) by mouth daily. 10/25/13  Yes Satira Sark, MD  warfarin (COUMADIN) 5 MG tablet Take 5 mg by mouth daily.   Yes Historical Provider, MD  zolpidem (AMBIEN) 5 MG tablet Take 5 mg by mouth at bedtime.    Yes Historical Provider, MD    No Known Allergies  Physical Exam  Vitals  Blood pressure 113/59, pulse 82, temperature 98 F (36.7 C), temperature source Oral, resp. rate 14, height 5\' 8"  (1.727 m), weight 84.369 kg (186 lb), SpO2 98.00%.   1. General white American male lying in bed in no acute distress, very pleasant  2. Normal affect and insight, Not Suicidal or Homicidal, Awake Alert, Oriented X 3.  3. No F.N deficits, ALL C.Nerves Intact, Strength 5/5 all 4 extremities, Sensation intact all 4 extremities, Plantars down going.  4. Ears and Eyes appear Normal, Conjunctivae clear, PERRLA. Moist Oral Mucosa.  5. Supple Neck, No JVD, No cervical lymphadenopathy appriciated, No Carotid Bruits.  6. Symmetrical Chest wall movement, Good air movement bilaterally, CTAB.  7. RRR, No Gallops, Rubs or Murmurs, No Parasternal Heave.  8. Positive Bowel Sounds, Abdomen Soft, Non tender, No organomegaly appriciated,No rebound -guarding or rigidity.  9.  No Cyanosis, Normal Skin Turgor,  No Skin Rash or Bruise.  10. Good muscle tone,  joints appear normal , no effusions, Normal ROM.  11. No Palpable Lymph Nodes in Neck or Axillae    Data Review  CBC  Recent Labs Lab 12/17/13 2126  WBC 10.9*  HGB 7.9*  HCT 24.1*  PLT 280  MCV 89.6  MCH 29.4  MCHC 32.8  RDW 16.2*   ------------------------------------------------------------------------------------------------------------------  Chemistries   Recent Labs Lab 12/17/13 2126  NA 143  K 4.2  CL 107  CO2 22  GLUCOSE 119*  BUN 44*  CREATININE 1.34  CALCIUM 8.6   ------------------------------------------------------------------------------------------------------------------ estimated creatinine clearance is 51.2 ml/min (by C-G formula based on Cr of 1.34). ------------------------------------------------------------------------------------------------------------------ No results found for this basename: TSH, T4TOTAL, FREET3, T3FREE, THYROIDAB,  in the last 72 hours   Coagulation profile  Recent Labs Lab 12/17/13 2259  INR 1.79*   ------------------------------------------------------------------------------------------------------------------- No results found for this basename: DDIMER,  in the last 72 hours -------------------------------------------------------------------------------------------------------------------  Cardiac Enzymes No results found for this basename: CK, CKMB, TROPONINI, MYOGLOBIN,  in the last 168 hours ------------------------------------------------------------------------------------------------------------------ No components found with this basename: POCBNP,    ---------------------------------------------------------------------------------------------------------------  Urinalysis    Component Value Date/Time   COLORURINE AMBER BIOCHEMICALS MAY BE AFFECTED BY COLOR* 11/03/2008 1200   APPEARANCEUR CLOUDY* 11/03/2008 1200   LABSPEC 1.030 11/03/2008 1200    PHURINE 5.5 11/03/2008 1200   GLUCOSEU NEGATIVE 11/03/2008 1200   HGBUR LARGE* 11/03/2008 1200   BILIRUBINUR SMALL* 11/03/2008 1200   KETONESUR 15* 11/03/2008 1200   PROTEINUR 100* 11/03/2008 1200   UROBILINOGEN 1.0 11/03/2008 1200   NITRITE NEGATIVE 11/03/2008 1200   LEUKOCYTESUR MODERATE* 11/03/2008 1200    ----------------------------------------------------------------------------------------------------------------  Imaging results:   Dg Chest Port 1 View  12/17/2013   CLINICAL DATA:  Chest pain.  EXAM: PORTABLE CHEST - 1 VIEW  COMPARISON:  11/24/2010.  FINDINGS: The heart is borderline enlarged but stable. There is tortuosity and calcification of the thoracic aorta. Stable surgical changes related to previous bypass surgery. The lungs are clear. No pleural effusion or pneumothorax. The bony thorax is intact.  IMPRESSION: No acute cardiopulmonary findings.   Electronically Signed   By: Kalman Jewels M.D.   On: 12/17/2013 21:37    My personal review of EKG: Normal sinus rhythm, low voltage at 80 beats per minute with minimal ST depressions  Assessment & Plan  1. GI bleed; patient had recent instrumentation with removal of polyps from his gastric mucosa and a colonoscopy and     now has guaiac-positive stools    Has major drop in his hemoglobin    Monitor    Transfuse when necessary 2. Chest pains/dyspnea with history of CABG     Rule out MI protocol with serial troponins   DVT Prophylaxis SCDs  AM Labs Ordered, also please review Full Orders  Code Status full  Disposition Plan: Home  Time spent in minutes : 32 minutes  Condition GUARDED    @SIGNATURE @

## 2013-12-18 NOTE — Progress Notes (Signed)
Next troponin 0.43, consistent with our suspicion for demand ischemia in the setting of anemia. Will f/u with additional troponin in the morning to trend. No anticoag for now per prior notes. Will cancel order for BNP given that he just had one last night and it was only 230. The above plan was formulated per discussion with Dr. Tamala Julian. Dayna Dunn PA-C

## 2013-12-18 NOTE — Progress Notes (Signed)
[facility] PROGRESS NOTE  Henry [name] NAT:[id number] DOB: 08-30-1940 DOA: 12/17/2013 PCP: Sherrie Mustache, MD   Brief narrative 73 year old male with history of extensive CAD status post multivessel CABG, cardiac cath, history of NSTEMI, chronic anginal symptoms, type 2 diabetes mellitus, asthma and history of pulmonary embolism chronic Coumadin who recently underwent EGD and colonoscopy for ? Guaiac-positive stools in [place] where he was found to have 3 gastric polyps that were removed (as per patient). He presented to the ED with chest pain and dizziness for 2 days with some shortness of breath. Patient found to have drop in his hemoglobin to 8 from 14 about 6 weeks back with guaiac-positive stool. Patient admitted to telemetry and ruled out for ACS and GI bleed.  Assessment/Plan: GI bleed Noted for significant drop in H&H. Patient on both Plavix and Coumadin which were presumed immediately after the procedure. Patient reports having 3 polyps removed from EGD which were essentially normal. BUN elevated. Possibly upper GI bleed following recent gastric polypectomy. Seen by GI and plan on EGD in a.m. Coumadin held. -Continue daily PPI. i have asked his wife to bring the EGD reports. monitor serial H&H    Unstable angina Patient presents with chest pain symptoms and has mild ST depression in inferior leads. He is stable on telemetry although has occasional chest pain symptoms. Patient has history of chronic angina and follows up with cardiology as outpatient. 2D echo from 2013 with normal LV fn.  Troponin so far negative. Repeat EKG. Monitor on telemetry. Patient on Plavix which has been held given GI bleed and plan for EGD tomorrow. Holding Coumadin given GI bleed. Continue sublingual nitrate . continue imdur and metoprolol. Continue statin. Repeat 2D echo.  Cardiology consulted. We'll follow his recommendations  Diabetes mellitus type 2 Hold metformin. Sliding scale  insulin  Code Status: full code Family Communication: none at bedside. Will update wife Disposition Plan: home once improved   Consultants:  GI  cardiology  Procedures:  For EGD in am  Antibiotics:  none  HPI/Subjective: Patient reports off-and-on chest pain symptoms  Objective: Filed Vitals:   12/18/13 1349  BP: 103/56  Pulse: 73  Temp: 97.8 F (36.6 C)  Resp: 19    Intake/Output Summary (Last 24 hours) at 12/18/13 1413 Last data filed at 12/18/13 1350  Gross per 24 hour  Intake 2242.08 ml  Output   1550 ml  Net 692.08 ml   Filed Weights   12/17/13 2051  Weight: 84.369 kg (186 lb)    Exam:   General: Elderly male in no acute distress  HEENT: No pallor, moist oral mucosa  Chest: Clear to auscultation bilaterally, no added sounds  CVS: Normal S1 and S2, no murmurs or gallop  Abdomen: Soft, nontender nondistended bowel sounds present  Extremities: Warm, no edema  CNS: AAO x3  Data Reviewed: Basic Metabolic Panel:  Recent Labs Lab 12/17/13 2126  NA 143  K 4.2  CL 107  CO2 22  GLUCOSE 119*  BUN 44*  CREATININE 1.34  CALCIUM 8.6   Liver Function Tests: No results found for this basename: AST, ALT, ALKPHOS, BILITOT, PROT, ALBUMIN,  in the last 168 hours No results found for this basename: LIPASE, AMYLASE,  in the last 168 hours No results found for this basename: AMMONIA,  in the last 168 hours CBC:  Recent Labs Lab 12/17/13 2126 12/18/13 0525  WBC 10.9* 9.7  HGB 7.9* 8.8*  HCT 24.1* 27.3*  MCV 89.6 89.8  PLT 280 276  Cardiac Enzymes:  Recent Labs Lab 12/18/13 0525  TROPONINI <0.30   BNP (last 3 results)  Recent Labs  12/17/13 2126  PROBNP 232.9*   CBG:  Recent Labs Lab 12/18/13 0727 12/18/13 1159  GLUCAP 106* 114*    No results found for this or any previous visit (from the past 240 hour(s)).   Studies: Dg Chest Port 1 View  12/17/2013   CLINICAL DATA:  Chest pain.  EXAM: PORTABLE CHEST - 1 VIEW   COMPARISON:  11/24/2010.  FINDINGS: The heart is borderline enlarged but stable. There is tortuosity and calcification of the thoracic aorta. Stable surgical changes related to previous bypass surgery. The lungs are clear. No pleural effusion or pneumothorax. The bony thorax is intact.  IMPRESSION: No acute cardiopulmonary findings.   Electronically Signed   By: Kalman Jewels M.D.   On: 12/17/2013 21:37    Scheduled Meds: . atorvastatin  40 mg Oral Daily  . chlorthalidone  25 mg Oral Daily  . diphenhydrAMINE  25 mg Oral Once  . insulin aspart  0-5 Units Subcutaneous QHS  . insulin aspart  0-9 Units Subcutaneous TID WC  . isosorbide mononitrate  120 mg Oral Daily  . metoprolol  100 mg Oral BID  . mometasone-formoterol  2 puff Inhalation BID  . pantoprazole  40 mg Oral Daily  . sodium chloride  3 mL Intravenous Q12H  . verapamil  240 mg Oral Daily  . zolpidem  5 mg Oral QHS   Continuous Infusions: . sodium chloride 75 mL/hr at 12/18/13 0405      Time spent: 35 minutes    Joseth Weigel  [facility] Pager [phone]. If 7PM-7AM, please contact night-coverage at [email or web address], password [facility] 12/18/2013, 2:13 PM  LOS: 1 day

## 2013-12-18 NOTE — Consult Note (Addendum)
The paient was interviewed, examined and treatment plan crafted with Mrs. Henry [name]. He has incredibly difficult clinical problems to manage and at this point is relatively stable. Plan is conservative observation from Cardiac standpoint as we believe the angina is induced by anemia in this instance, No ischemic evaluation is necessary. The source if blood loss needs to be identified and as soon as possible we should resume anticoagulation to avoid recurrent PE as soon as felt safe. Also note recent history of hemoptysis and abnormal chest CT in past. Complains of dyspnea. Likely from anemia/ischemia/possibly volume following transfusion. Will check BNP and if significantly elevated 1 dose of IV lasix may be needed.

## 2013-12-18 NOTE — Consult Note (Signed)
Consultation  Referring Provider: [facility]     Primary Care Physician:  Sherrie Mustache, MD Primary Gastroenterologist: ? Patient cannot recall name but is in [place] ( Dr Britta Mccreedy)       Reason for Consultation: GI bleed              HPI:   Henry [name] is a 73 y.o. male with multiple medical problems, on multiple medications including plavix and coumadin. Patient recently underwent EGD / colonoscopy for what sounds like occult GIB. Per his reports the colonoscopy was normal but a few polyps were removed from the stomach, one of which "required a stitch". It sounds like patient was bridged with SQ anticoagulants and then restarted coumadin / plavix on the evening of the procedures. He has been on iron while under evaluation for occult GI bleeding so stools are black.   Patient admitted last night for chest pain and weakness.  He was found to quite anemic with hgb of 7.9.   Past Medical History  Diagnosis Date  . Coronary atherosclerosis of native coronary artery     Multivessel s.p CABG, occluded SVG to OM and SVG to RCA, DES to circ 2006,  . NSTEMI (non-ST elevated myocardial infarction)     2005  . Essential hypertension, benign   . Type 2 diabetes mellitus   . Dyslipidemia   . ACE inhibitor intolerance   . Asthma   . Pulmonary embolism     5/13    Past Surgical History  Procedure Laterality Date  . Coronary artery bypass graft  2005     LIMA to LAD, SVG to OM, SVG to RCA  . L4-l5 laminectomy    . Colonoscopy    . Endooscopy      Family History  Problem Relation Age of Onset  . Stroke    . Diabetes Mellitus II     No colon or gastric cancer.  Sister - breast cancer  History  Substance Use Topics  . Smoking status: Former Smoker -- 0.30 packs/day for 30 years    Types: Cigarettes    Quit date: 07/15/1972  . Smokeless tobacco: Current User    Types: Chew     Comment: chews 1/2 pack tobacco per day  . Alcohol Use: No    Prior to Admission  medications   Medication Sig Start Date End Date Taking? Authorizing Provider  acetaminophen (TYLENOL) 325 MG tablet Take 650 mg by mouth every 4 (four) hours as needed. For pain   Yes Historical Provider, MD  atorvastatin (LIPITOR) 40 MG tablet Take 40 mg by mouth daily.   Yes Historical Provider, MD  chlorthalidone (HYGROTON) 25 MG tablet Take 25 mg by mouth daily.   Yes Historical Provider, MD  clopidogrel (PLAVIX) 75 MG tablet Take 75 mg by mouth daily with breakfast.   Yes Historical Provider, MD  Coenzyme Q10 (CO Q 10) 10 MG CAPS Take 200 mg by mouth daily.   Yes Historical Provider, MD  esomeprazole (NEXIUM) 40 MG capsule Take 40 mg by mouth daily before breakfast.   Yes Historical Provider, MD  fish oil-omega-3 fatty acids 1000 MG capsule Take 2 g by mouth daily.    Yes Historical Provider, MD  fluticasone (FLONASE) 50 MCG/ACT nasal spray Place 2 sprays into the nose daily.     Yes Historical Provider, MD  Fluticasone-Salmeterol (ADVAIR DISKUS) 250-50 MCG/DOSE AEPB Inhale 1 puff into the lungs 2 (two) times daily.     Yes  Historical Provider, MD  Iron Combinations (IRON COMPLEX PO) Take 1 capsule by mouth 2 (two) times daily.   Yes Historical Provider, MD  isosorbide mononitrate (IMDUR) 120 MG 24 hr tablet Take 180 mg by mouth daily.   Yes Historical Provider, MD  metFORMIN (GLUCOPHAGE) 500 MG tablet Take 500 mg by mouth 2 (two) times daily with a meal.   Yes Historical Provider, MD  metoprolol (LOPRESSOR) 100 MG tablet Take 100 mg by mouth 2 (two) times daily.   Yes Historical Provider, MD  mometasone (NASONEX) 50 MCG/ACT nasal spray Place 2 sprays into the nose daily.   Yes Historical Provider, MD  nitroGLYCERIN (NITROSTAT) 0.4 MG SL tablet Place 1 tablet (0.4 mg total) under the tongue every 5 (five) minutes as needed. For chest pain 03/27/12  Yes Burna Forts Serpe, PA-C  verapamil (CALAN-SR) 240 MG CR tablet Take 1 tablet (240 mg total) by mouth daily. 10/25/13  Yes Satira Sark, MD    warfarin (COUMADIN) 5 MG tablet Take 5 mg by mouth daily.   Yes Historical Provider, MD  zolpidem (AMBIEN) 5 MG tablet Take 5 mg by mouth at bedtime.    Yes Historical Provider, MD    Current Facility-Administered Medications  Medication Dose Route Frequency Provider Last Rate Last Dose  . 0.9 %  sodium chloride infusion   Intravenous Continuous Merton Border, MD 75 mL/hr at 12/18/13 0405    . acetaminophen (TYLENOL) tablet 650 mg  650 mg Oral Q6H PRN Merton Border, MD       Or  . acetaminophen (TYLENOL) suppository 650 mg  650 mg Rectal Q6H PRN Merton Border, MD      . acetaminophen (TYLENOL) tablet 650 mg  650 mg Oral Q4H PRN Merton Border, MD      . atorvastatin (LIPITOR) tablet 40 mg  40 mg Oral Daily Merton Border, MD   40 mg at 12/18/13 0865  . chlorthalidone (HYGROTON) tablet 25 mg  25 mg Oral Daily Merton Border, MD   25 mg at 12/18/13 7846  . diphenhydrAMINE (BENADRYL) capsule 25 mg  25 mg Oral Once Merton Border, MD      . insulin aspart (novoLOG) injection 0-5 Units  0-5 Units Subcutaneous QHS Samella Parr, NP      . insulin aspart (novoLOG) injection 0-9 Units  0-9 Units Subcutaneous TID WC Samella Parr, NP      . isosorbide mononitrate (IMDUR) 24 hr tablet 120 mg  120 mg Oral Daily Merton Border, MD   120 mg at 12/18/13 0928  . metoprolol (LOPRESSOR) tablet 100 mg  100 mg Oral BID Merton Border, MD   100 mg at 12/18/13 0929  . mometasone-formoterol (DULERA) 100-5 MCG/ACT inhaler 2 puff  2 puff Inhalation BID Merton Border, MD   2 puff at 12/18/13 0830  . morphine 2 MG/ML injection 2 mg  2 mg Intravenous Q4H PRN Merton Border, MD      . nitroGLYCERIN (NITROSTAT) SL tablet 0.4 mg  0.4 mg Sublingual Q5 min PRN Merton Border, MD      . ondansetron [facility]) tablet 4 mg  4 mg Oral Q6H PRN Merton Border, MD       Or  . ondansetron (ZOFRAN) injection 4 mg  4 mg Intravenous Q6H PRN Merton Border, MD      . oxyCODONE (Oxy IR/ROXICODONE) immediate release tablet 5 mg  5 mg Oral Q4H PRN Merton Border, MD   5 mg at 12/18/13 0228   . pantoprazole (PROTONIX)  EC tablet 40 mg  40 mg Oral Daily Merton Border, MD   40 mg at 12/18/13 0929  . sodium chloride 0.9 % injection 3 mL  3 mL Intravenous Q12H Merton Border, MD      . verapamil (CALAN-SR) CR tablet 240 mg  240 mg Oral Daily Merton Border, MD   240 mg at 12/18/13 0929  . zolpidem (AMBIEN) tablet 5 mg  5 mg Oral QHS Merton Border, MD        Allergies as of 12/17/2013  . (No Known Allergies)    Review of Systems:    All systems reviewed and negative except where noted in HPI.   Physical Exam:  Vital signs in last 24 hours: Temp:  [96.6 F (35.9 C)-98.2 F (36.8 C)] 97.8 F (36.6 C) (06/06 0542) Pulse Rate:  [79-86] 81 (06/06 0542) Resp:  [11-29] 19 (06/06 0542) BP: (92-142)/(50-76) 106/50 mmHg (06/06 0542) SpO2:  [95 %-100 %] 95 % (06/06 0832) Weight:  [186 lb (84.369 kg)] 186 lb (84.369 kg) (06/05 2051) Last BM Date: 12/17/13 General:   Pleasant white male in NAD< In bedside chair Head:  Normocephalic and atraumatic. Eyes:   No icterus.   Conjunctiva pink. Ears:  Slightly HOH.  Neck:  Supple; no masses felt Lungs:  Respirations even and unlabored. Lungs clear to auscultation bilaterally.   No wheezes, crackles, or rhonchi.  Heart:  Regular rate and rhythm;  murmur heard. Abdomen:  Soft, nondistended, nontender. Limited exam in bedside chair. .  Rectal:  Not performed.  Msk:  Symmetrical without gross deformities.  Extremities:  Without edema. Neurologic:  Alert and  oriented x4;  grossly normal neurologically. Skin:  Intact without significant lesions or rashes. Cervical Nodes:  No significant cervical adenopathy. Psych:  Alert and cooperative. Normal affect.  LAB RESULTS:  Recent Labs  12/17/13 2126 12/18/13 0525  WBC 10.9* 9.7  HGB 7.9* 8.8*  HCT 24.1* 27.3*  PLT 280 276   BMET  Recent Labs  12/17/13 2126  NA 143  K 4.2  CL 107  CO2 22  GLUCOSE 119*  BUN 44*  CREATININE 1.34  CALCIUM 8.6   PT/INR  Recent Labs  12/17/13 2259  LABPROT  20.3*  INR 1.79*    PREVIOUS ENDOSCOPIES:            EGD /colonoscopy in [place], [place] approximately 11 days ago   Impression / Plan:   59. 73 year old male with GI bleed on coumadin and plavix. . Stools black on iron but  Heme positive. BUN elevated. Suspect upper GI bleed, presumably from site of recent gastric polypectomy.   Patient will need EGD in am (INR still slightly elevated at 1.7 and would like it less than 1.5).   Clears okay for today.  Continue daily PPI.   Plavix and coumadin are on hold.   Wife has copies of endoscopy reports. I asked husband to have her bring them with her to hospital if possible.   2. Anemia of acute blood loss. Hgb down 5 grams from baseline (13 >>>7.9). He had an appropriate rise in hgb after one unit of blood.   3. Multiple, significant medical problems as listed in [place]  Thanks   LOS: 1 day   Willia Craze  12/18/2013, 9:31 AM  Attending MD note:   I have taken a history, examined the patient, and reviewed the chart. I agree with the Advanced Practitioner's impression and recommendations. Acute UGIB on the background of ?  Chronic GI blood loss. I suspect post polypectomy bleed. Pt's anticoagulants started  immediately following polypectomy. Will proceed with EGD. In am.  [facility] Pager # [phone]

## 2013-12-18 NOTE — Progress Notes (Signed)
PHARMACIST - PHYSICIAN ORDER COMMUNICATION  CONCERNING: P&T Medication Policy on Herbal Medications  DESCRIPTION:  This patient's order for:  Co-enzyme Q 10 has been noted.  This product(s) is classified as an "herbal" or natural product. Due to a lack of definitive safety studies or FDA approval, nonstandard manufacturing practices, plus the potential risk of unknown drug-drug interactions while on inpatient medications, the Pharmacy and Therapeutics Committee does not permit the use of "herbal" or natural products of this type within [facility].   ACTION TAKEN: The pharmacy department is unable to verify this order at this time and your patient has been informed of this safety policy. Please reevaluate patient's clinical condition at discharge and address if the herbal or natural product(s) should be resumed at that time. Eudelia Bunch, Pharm.D. [phone] 12/18/2013 2:00 AM

## 2013-12-18 NOTE — Consult Note (Signed)
Cardiology Consultation Note  Patient ID: Henry [name], MRN: [id number], DOB/AGE: [birth year] 73 y.o. Admit date: 12/17/2013   Date of Consult: 12/18/2013 Primary Physician: Sherrie Mustache, MD Primary Cardiologist: Domenic Polite (previously Golf)  Chief Complaint: CP Reason for Consult: CP in the setting of anemia  HPI: Henry [name] is a 73 y/o M with history of CAD (NSTEMI s/p 3vCABG 2005 with known occluded VGs and patent LIMA-LAD, DES to Cx 2006, NSTEMI 07/2010 mg'd medically), chronic stable angina PTA, recurrent significant pulmonary emboli (01/2011, 11/2011) with possible positive lupus anticoagulant, asthma who presented to [facility] with chest pain and GIB. He states he underwent colonscopy on 5/26 in [place] which was unremarkable, then EGD with polyp removal, one of which "required a stitch." He thinks he may have been anemic but is not sure to what degree. We do not have any of these outpatient records over the weekend. The last information available to Henry [name] is cardiology OV in 07/2013 at which time the patient was doing well. His admitting Hgb was 7.9 whereas last Hgb in Epic was 13 in 12/2011.  He also reports 2 day history of SOB particularly with exertion. Yesterday around 6:30pm he developed substernal chest pressure radiating to his left arm associated with dyspnea and mild sweating while watching TV. He took old SL NTG with no relief. EMS was called and he received NTG which relieved the pain - duration was about 1.5 hrs. He reports compliance with Plavix and Coumadin - his PCP has been following his INR. Admitting INR 1.79. He received 1 unit of blood bringing Hgb up to 8.8 Troponins neg x 2 and pBNP 232. 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 GI plans for repeat EGD in AM. Plavix and Coumadin are on hold.  Of note, the patient mentions that a week ago on 5/29, he had an episode of coughing with 3 small sputum samples with blood in it. He does have a hx  of RML nodule by CT in 11/2011 which was stable comparative to 2012. He does not believe he's had CT scanning since. He denies fever, chills, night sweats, weight changes, orthoponea, syncope or nausea/vomiting.   Past Medical History  Diagnosis Date  . Coronary atherosclerosis of native coronary artery     a. NSTEMI 2005 s/p CABG (LIMA - LAD, SVG - OM, SVG - PDA). b. DES to Cx 02/2005. c. NSTEMI 07/2010 due to occ Cx - med rx. Known occluded vein grafts.  . Essential hypertension, benign   . Type 2 diabetes mellitus   . Dyslipidemia   . ACE inhibitor intolerance   . Asthma   . Recurrent pulmonary embolism     a. 01/2011. b. Bilateral PE 11/2011 - + lupus anticoagulant preliminary testing. Felt to require lifelong Coumadin.  . Pulmonary nodule     a. RML by CT 11/2011, stable compared to 2012.  Marland Kitchen Cholelithiasis     a. By CT 11/2011.  . Pulmonary HTN     a. By echo 11/2011 at time of PE. PA pressure not assessed on 03/2012 echo but RV was back to normal.  . Lupus anticoagulant positive   . GERD (gastroesophageal reflux disease)       Most Recent Cardiac Studies: 2D Echo 03/2012 - Left ventricle: The cavity size was normal. Wall thickness was increased in a pattern of mild LVH. Systolic function was normal. The estimated ejection fraction was in the range of 60% to  65%. Probable hypokinesis of the distalinferoseptal and apical myocardium. Possible hypokinesis of the basalinferior myocardium. Doppler parameters are consistent with abnormal left ventricular relaxation (grade 1 diastolic dysfunction). - Aortic valve: Mildly calcified annulus. Trileaflet; moderately thickened, mildly calcified leaflets. No significant regurgitation. Mean gradient: 39mm Hg (S). - Mitral valve: Calcified annulus. Trivial regurgitation. - Left atrium: The atrium was mildly dilated. - Right ventricle: The cavity size was normal. Systolic function was normal. - Tricuspid valve: Trivial regurgitation. - Pulmonary  arteries: Systolic pressure could not be accurately estimated. - Pericardium, extracardiac: There was no pericardial effusion.   Cath 2012 DATE OF PROCEDURE: 01/27/2011  DATE OF DISCHARGE:  CARDIAC CATHETERIZATION  INDICATIONS: [name] is well-known to Henry [name]. He last underwent cath in  January. He had previous bypass surgery in 2006. At that time, his  internal mammary was patent but his vein grafts were occluded. His  distal circumflex is severely diseased and the obtuse marginal III was  occluded. The right is occluded, the vein graft is occluded that fills  by collaterals from the proximal circumflex which is stented. He  presented today with prolonged chest pain. Initial cardiac enzymes are  pending, but the pain persisted and was similar to what he had in  January. As a result, we elected to bring him to the catheterization  laboratory urgently. He was originally called in as a STEMI, but there  was no electrocardiographic evidence to suggest this.  PROCEDURE:  1. Left heart catheterization.  2. Selective coronary arteriography.  3. Saphenous vein graft angiography.  4. Selective left internal mammary angiography.  5. Selective left ventriculography.  DESCRIPTION OF PROCEDURE: The procedure was performed from the right  femoral artery. The natives were injected followed by both vein grafts  in the internal mammary, the IMA was injected using an IMA catheter.  Central aortic and left ventricular pressure was measured with pigtail  and ventriculography was performed in the RAO projection. His ACT was  adequate for sheath removal in the lab. In comparing the films to the  old films, there was not a substantial change and continued medical  therapy was recommended. I reviewed the films in detail with his  family.  ANGIOGRAPHIC DATA:  1. On plain fluoroscopy, there was extensive calcification of  coronaries.  2. The left main is intact.  3. The LAD has about 70% narrowing, is  diffusely diseased and then is  totally occluded after the septal. The septal has 80-90% proximal  narrowing. The tiny diagonals with the third diagonal have some  ostial disease.  4. The internal mammary to distal left anterior descending artery  supplies the distal left anterior descending artery nicely. There  are mild luminal irregularities down to the apex where there is  probably a focal eccentric stenosis prior to the bifurcation of  about 80% to the distal tip.  5. The circumflex has about 40% proximal narrowing, it does not appear  to be flow-limiting, followed by another 40% leading into a stented  area. There is an A-V collateral that goes to the distal right  coronary. The distal right coronary itself appears to be somewhat  diffusely diseased, particularly in the PDA distribution, but does  fill extensively by the collaterals. The continuation of the  circumflex goes into a marginal. There is an additional marginal  which is occluded and fills by retrograde collaterals, small and  diffusely diseased. The AV circumflex at this point goes down and  divides into a bifurcating branch distally. The  caliber of the  vessel down here is really quite small, segmentally and diffusely  diseased over a long segment, and no bigger than about 1.5 to 2 mm  in size.  6. The saphenous vein graft to the obtuse marginal is occluded.  7. The saphenous vein graft to the PDA is occluded.  8. The native RCA is occluded.  9. Ventriculography in the RAO projection reveals if anything only  mild hypokinesis of the apex but otherwise normal wall motion,  ejection fraction to be estimated at 50%.  CONCLUSION:  1. Patent internal mammary to the LAD.  2. Occluded saphenous vein grafts to the obtuse marginal and RCA.  3. Severe native circumflex disease involves the very distal portion.  There is a 90% stenosis and very diffuse disease with very small-  caliber vessels. As a result, we would recommend  continued medical  therapy. The distal targets are not ideal for either bypass or  stenting. The circumflex itself has bifurcational disease, it is  very small in caliber. The lesions are quite long and he is  diabetic. A good angiographic result from stenting would be  unlikely on a long-term basis. Hence continued medical therapy  would be recommended.  Loretha Brasil. Lia Foyer, MD, [facility]    Surgical History:  Past Surgical History  Procedure Laterality Date  . Coronary artery bypass graft  2005     LIMA to LAD, SVG to OM, SVG to RCA  . L4-l5 laminectomy    . Colonoscopy    . Endooscopy       Home Meds: Prior to Admission medications   Medication Sig Start Date End Date Taking? Authorizing Provider  acetaminophen (TYLENOL) 325 MG tablet Take 650 mg by mouth every 4 (four) hours as needed. For pain   Yes Historical Provider, MD  atorvastatin (LIPITOR) 40 MG tablet Take 40 mg by mouth daily.   Yes Historical Provider, MD  chlorthalidone (HYGROTON) 25 MG tablet Take 25 mg by mouth daily.   Yes Historical Provider, MD  clopidogrel (PLAVIX) 75 MG tablet Take 75 mg by mouth daily with breakfast.   Yes Historical Provider, MD  Coenzyme Q10 (CO Q 10) 10 MG CAPS Take 200 mg by mouth daily.   Yes Historical Provider, MD  esomeprazole (NEXIUM) 40 MG capsule Take 40 mg by mouth daily before breakfast.   Yes Historical Provider, MD  fish oil-omega-3 fatty acids 1000 MG capsule Take 2 g by mouth daily.    Yes Historical Provider, MD  fluticasone (FLONASE) 50 MCG/ACT nasal spray Place 2 sprays into the nose daily.     Yes Historical Provider, MD  Fluticasone-Salmeterol (ADVAIR DISKUS) 250-50 MCG/DOSE AEPB Inhale 1 puff into the lungs 2 (two) times daily.     Yes Historical Provider, MD  Iron Combinations (IRON COMPLEX PO) Take 1 capsule by mouth 2 (two) times daily.   Yes Historical Provider, MD  isosorbide mononitrate (IMDUR) 120 MG 24 hr tablet Take 180 mg by mouth daily.   Yes Historical Provider, MD    metFORMIN (GLUCOPHAGE) 500 MG tablet Take 500 mg by mouth 2 (two) times daily with a meal.   Yes Historical Provider, MD  metoprolol (LOPRESSOR) 100 MG tablet Take 100 mg by mouth 2 (two) times daily.   Yes Historical Provider, MD  mometasone (NASONEX) 50 MCG/ACT nasal spray Place 2 sprays into the nose daily.   Yes Historical Provider, MD  nitroGLYCERIN (NITROSTAT) 0.4 MG SL tablet Place 1 tablet (0.4 mg total) under the  tongue every 5 (five) minutes as needed. For chest pain 03/27/12  Yes Burna Forts Serpe, PA-C  verapamil (CALAN-SR) 240 MG CR tablet Take 1 tablet (240 mg total) by mouth daily. 10/25/13  Yes Satira Sark, MD  warfarin (COUMADIN) 5 MG tablet Take 5 mg by mouth daily.   Yes Historical Provider, MD  zolpidem (AMBIEN) 5 MG tablet Take 5 mg by mouth at bedtime.    Yes Historical Provider, MD    Inpatient Medications:  . atorvastatin  40 mg Oral Daily  . chlorthalidone  25 mg Oral Daily  . diphenhydrAMINE  25 mg Oral Once  . insulin aspart  0-5 Units Subcutaneous QHS  . insulin aspart  0-9 Units Subcutaneous TID WC  . isosorbide mononitrate  120 mg Oral Daily  . metoprolol  100 mg Oral BID  . mometasone-formoterol  2 puff Inhalation BID  . pantoprazole  40 mg Oral Daily  . sodium chloride  3 mL Intravenous Q12H  . verapamil  240 mg Oral Daily  . zolpidem  5 mg Oral QHS   . sodium chloride 75 mL/hr at 12/18/13 0405    Allergies:  Allergies  Allergen Reactions  . Ace Inhibitors     Cough    History   Social History  . Marital Status: Married    Spouse Name: N/A    Number of Children: N/A  . Years of Education: N/A   Occupational History  . Not on file.   Social History Main Topics  . Smoking status: Former Smoker -- 0.30 packs/day for 30 years    Types: Cigarettes    Quit date: 07/15/1972  . Smokeless tobacco: Current User    Types: Chew     Comment: chews 1/2 pack tobacco per day  . Alcohol Use: No  . Drug Use: No  . Sexual Activity: Not on file    Other Topics Concern  . Not on file   Social History Narrative   Married, retired.      Family History  Problem Relation Age of Onset  . Stroke    . Diabetes Mellitus II       Review of Systems: General: negative for chills, fever, night sweats or weight changes.  Cardiovascular: see above Dermatological: negative for rash Urologic: negative for hematuria Neurologic: negative for visual changes, syncope All other systems reviewed and are otherwise negative except as noted above.  Labs:  Recent Labs  12/18/13 0525  TROPONINI <0.30   Lab Results  Component Value Date   WBC 9.7 12/18/2013   HGB 8.8* 12/18/2013   HCT 27.3* 12/18/2013   MCV 89.8 12/18/2013   PLT 276 12/18/2013    Recent Labs Lab 12/17/13 2126  NA 143  K 4.2  CL 107  CO2 22  BUN 44*  CREATININE 1.34  CALCIUM 8.6  GLUCOSE 119*   Lab Results  Component Value Date   CHOL 124 12/09/2011   HDL 31* 12/09/2011   LDLCALC 49 12/09/2011   TRIG 222* 12/09/2011   Radiology/Studies:  Dg Chest Port 1 View 12/17/2013   CLINICAL DATA:  Chest pain.  EXAM: PORTABLE CHEST - 1 VIEW  COMPARISON:  11/24/2010.  FINDINGS: The heart is borderline enlarged but stable. There is tortuosity and calcification of the thoracic aorta. Stable surgical changes related to previous bypass surgery. The lungs are clear. No pleural effusion or pneumothorax. The bony thorax is intact.  IMPRESSION: No acute cardiopulmonary findings.   Electronically Signed   By: Elta Guadeloupe  Gallerani M.D.   On: 12/17/2013 21:37   EKG: NSR 80bpm, minimal ST depression inferolaterally  Physical Exam: Blood pressure 106/50, pulse 81, temperature 97.8 F (36.6 C), temperature source Oral, resp. rate 19, height 5\' 8"  (1.727 m), weight 186 lb (84.369 kg), SpO2 95.00%. General: Well developed, well nourished WM in no acute distress. Head: Normocephalic, atraumatic, sclera non-icteric, no xanthomas, nares are without discharge.  Neck: Faint R carotid bruit. JVD not  elevated. Lungs: Clear bilaterally to auscultation without wheezes, rales, or rhonchi. Breathing is unlabored. Heart: RRR with S1 S2. No murmurs, rubs, or gallops appreciated. Abdomen: Soft, non-tender, non-distended with normoactive bowel sounds. No hepatomegaly. No rebound/guarding. No obvious abdominal masses. Msk:  Strength and tone appear normal for age. Extremities: No clubbing or cyanosis. No edema.  Distal pedal pulses are 2+ and equal bilaterally. Neuro: Alert and oriented X 3. No facial asymmetry. No focal deficit. Moves all extremities spontaneously. Psych:  Responds to questions appropriately with a normal affect.    Assessment and Plan:   1. Subacute anemia with GI bleed 2. Chest pain/dyspnea likely multifactorial from anemia superimposed on severe CAD 3. CAD s/p CABG 2005 (1/3 grafts patent), DES to Cx 2006, NSTEMI 2012 managed medically -> h/o chronic stable angina  4. Recurrent bilateral PE (01/2011, 11/2011) with + lupus anticoagulant 5. Pulmonary nodule by CT 11/2011 with recent hemoptysis vs blood from stomach 6. R carotid bruit - prior duplex in 2012 with <50% BICA 7. HTN 8. Diabetes mellitus 9. Asthma 10. 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 EKG shows inferolateral ST depressions consistent with demand. Await last troponin this afternoon. Would likely favor treating him medically with antianginals continuing home doses of metoprolol, diltiazem, and Imur in light of anemia. He currently feels better post-transfusion. He has not had any recent PCI so would not resume Plavix at this time. However, he is at high risk for recurrent PE given prior history of such and possible + lupus anticoagulant by hypercoag panel in 2013. The patient was supposed to see hematology at [facility] to confirm this but it's not clear this was ever completely arranged. Would strongly recommend to resume anticoagulation with  ASAP to prevent VTE. I do not think his current CP is reflective of PE given lack of tachycardia, tachypnea, or hypoxia. He will likely benefit from a repeat CT scan at some point to re-evaluate his pulmonary nodule. He reports an isolated episode of blood in his sputum but this was after forceful coughing and may have been referred blood from the stomach. He denies any other constitutional symptoms. Lastly, would recommend update carotid duplex as outpatient. Consider d/c chlorthalidone to allow more BP room in case anginal med titration is needed.  Signed, Nedra Hai Dunn PA-C 12/18/2013, 1:27 PM

## 2013-12-19 ENCOUNTER — Encounter (HOSPITAL_COMMUNITY)
Admission: EM | Disposition: A | Discharge status: Home / Self Care | Payer: Self-pay | Source: Home / Self Care | Attending: Internal Medicine

## 2013-12-19 DIAGNOSIS — I517 Cardiomegaly: Secondary | ICD-10-CM

## 2013-12-19 DIAGNOSIS — R799 Abnormal finding of blood chemistry, unspecified: Secondary | ICD-10-CM

## 2013-12-19 DIAGNOSIS — I214 Non-ST elevation (NSTEMI) myocardial infarction: Secondary | ICD-10-CM | POA: Diagnosis present

## 2013-12-19 DIAGNOSIS — K922 Gastrointestinal hemorrhage, unspecified: Secondary | ICD-10-CM | POA: Diagnosis present

## 2013-12-19 DIAGNOSIS — D131 Benign neoplasm of stomach: Secondary | ICD-10-CM

## 2013-12-19 DIAGNOSIS — I2699 Other pulmonary embolism without acute cor pulmonale: Secondary | ICD-10-CM

## 2013-12-19 HISTORY — PX: ESOPHAGOGASTRODUODENOSCOPY: SHX5428

## 2013-12-19 LAB — GLUCOSE, CAPILLARY
GLUCOSE-CAPILLARY: 120 mg/dL — AB (ref 70–99)
GLUCOSE-CAPILLARY: 149 mg/dL — AB (ref 70–99)
Glucose-Capillary: 116 mg/dL — ABNORMAL HIGH (ref 70–99)
Glucose-Capillary: 99 mg/dL (ref 70–99)

## 2013-12-19 LAB — BASIC METABOLIC PANEL
BUN: 15 mg/dL (ref 6–23)
CALCIUM: 9.2 mg/dL (ref 8.4–10.5)
CHLORIDE: 102 meq/L (ref 96–112)
CO2: 25 meq/L (ref 19–32)
Creatinine, Ser: 1.03 mg/dL (ref 0.50–1.35)
GFR calc non Af Amer: 69 mL/min — ABNORMAL LOW (ref >90)
GFR, EST AFRICAN AMERICAN: 81 mL/min — AB (ref >90)
Glucose, Bld: 127 mg/dL — ABNORMAL HIGH (ref 70–99)
Potassium: 4 mEq/L (ref 3.7–5.3)
SODIUM: 141 meq/L (ref 137–147)

## 2013-12-19 LAB — HEMOGLOBIN AND HEMATOCRIT, BLOOD
HCT: 28.7 % — ABNORMAL LOW (ref 39.0–52.0)
HEMATOCRIT: 32.1 % — AB (ref 39.0–52.0)
HEMOGLOBIN: 10.3 g/dL — AB (ref 13.0–17.0)
Hemoglobin: 9.3 g/dL — ABNORMAL LOW (ref 13.0–17.0)

## 2013-12-19 LAB — TYPE AND SCREEN
ABO/RH(D): A NEG
Antibody Screen: NEGATIVE
Unit division: 0

## 2013-12-19 LAB — TROPONIN I
TROPONIN I: 0.6 ng/mL — AB (ref <0.30)
Troponin I: 0.52 ng/mL (ref <0.30)

## 2013-12-19 LAB — PROTIME-INR
INR: 1.8 — ABNORMAL HIGH (ref 0.00–1.49)
Prothrombin Time: 20.4 seconds — ABNORMAL HIGH (ref 11.6–15.2)

## 2013-12-19 SURGERY — EGD (ESOPHAGOGASTRODUODENOSCOPY)
Anesthesia: Moderate Sedation

## 2013-12-19 MED ORDER — FENTANYL CITRATE 0.05 MG/ML IJ SOLN
INTRAMUSCULAR | Status: DC | PRN
  Administered 2013-12-19 (×3): 25 ug via INTRAVENOUS

## 2013-12-19 MED ORDER — FENTANYL CITRATE 0.05 MG/ML IJ SOLN
INTRAMUSCULAR | Status: AC
  Filled 2013-12-19: qty 2

## 2013-12-19 MED ORDER — SODIUM CHLORIDE 0.9 % IV SOLN
INTRAVENOUS | Status: DC
  Administered 2013-12-19: 500 mL via INTRAVENOUS

## 2013-12-19 MED ORDER — MIDAZOLAM HCL 10 MG/2ML IJ SOLN
INTRAMUSCULAR | Status: DC | PRN
  Administered 2013-12-19: 1 mg via INTRAVENOUS
  Administered 2013-12-19: 2 mg via INTRAVENOUS
  Administered 2013-12-19: 1 mg via INTRAVENOUS
  Administered 2013-12-19: 2 mg via INTRAVENOUS

## 2013-12-19 MED ORDER — EPINEPHRINE HCL 0.1 MG/ML IJ SOSY
PREFILLED_SYRINGE | INTRAMUSCULAR | Status: AC
  Filled 2013-12-19: qty 10

## 2013-12-19 MED ORDER — SODIUM CHLORIDE 0.9 % IJ SOLN
PREFILLED_SYRINGE | INTRAMUSCULAR | Status: DC | PRN
  Administered 2013-12-19: 09:00:00

## 2013-12-19 MED ORDER — BUTAMBEN-TETRACAINE-BENZOCAINE 2-2-14 % EX AERO
INHALATION_SPRAY | CUTANEOUS | Status: DC | PRN
  Administered 2013-12-19: 2 via TOPICAL

## 2013-12-19 MED ORDER — MIDAZOLAM HCL 5 MG/ML IJ SOLN
INTRAMUSCULAR | Status: AC
  Filled 2013-12-19: qty 2

## 2013-12-19 NOTE — Progress Notes (Addendum)
[facility] PROGRESS NOTE  Henry [name] XTK:[id number] DOB: [birth year] DOA: 12/17/2013 PCP: Sherrie Mustache, MD   Brief narrative 73 year old male with history of extensive CAD status post multivessel CABG, cardiac cath, history of NSTEMI, chronic anginal symptoms, type 2 diabetes mellitus, asthma and history of pulmonary embolism chronic Coumadin who recently underwent EGD and colonoscopy for ? Guaiac-positive stools in [place] where he was found to have 3 gastric polyps that were removed (as per patient). He presented to the ED with chest pain and dizziness for 2 days with some shortness of breath. Patient found to have drop in his hemoglobin to 8 from 14 about 6 weeks back with guaiac-positive stool. Patient admitted to telemetry and ruled out for ACS and GI bleed.  Assessment/Plan: Upper GI bleed Noted for significant drop in H&H. Patient on both Plavix and Coumadin which were resumed immediately after the procedure. Patient reports having 3 polyps removed from EGD which were essentially normal. -Plavix and Coumadin held. EGD done by GI this morning showing feeding polyps which were ablated. Tolerated procedure well. H&H has started to improve.. Continue daily PPI.  monitor serial H&H -Recommend to hold Coumadin and Plavix for next 10 days. I will discuss with cardiology if we should keep patient only on Coumadin.   Unstable angina/ NSTEMI Patient presents with chest pain symptoms and had mild ST depression in inferior leads. Patient has history of chronic angina and follows up with cardiology as outpatient. Patient also has mild elevation of troponin suggestive of demand ischemia from underlying anemia. Seen by cardiology and recommended medical management. Continue Imdur and metoprolol. Continue statin. Continue sublingual nitrate. Plavix has given GI bleed. Follow serial troponins. Followup 2D  echo.   Diabetes mellitus type 2 Hold metformin. Sliding scale insulin  Code Status:  full code Family Communication: Wife at bedside Disposition Plan: home once improved   Consultants:  GI  cardiology  Procedures:  EGD 6/7  Antibiotics:  none  HPI/Subjective: Vision seen after returning from EGD . Reports mild chest discomfort port procedure  Objective: Filed Vitals:   12/19/13 0915  BP: 177/76  Pulse: 87  Temp:   Resp: 13    Intake/Output Summary (Last 24 hours) at 12/19/13 1109 Last data filed at 12/19/13 0900  Gross per 24 hour  Intake   1405 ml  Output   1600 ml  Net   -195 ml   Filed Weights   12/17/13 2051  Weight: 84.369 kg (186 lb)    Exam:   General: Elderly male in no acute distress  HEENT: No pallor, moist oral mucosa  Chest: Clear to auscultation bilaterally, no added sounds  CVS: Normal S1 and S2, no murmurs or gallop  Abdomen: Soft, nontender nondistended bowel sounds present  Extremities: Warm, no edema  CNS: AAO x3  Data Reviewed: Basic Metabolic Panel:  Recent Labs Lab 12/17/13 2126 12/19/13 0600  NA 143 141  K 4.2 4.0  CL 107 102  CO2 22 25  GLUCOSE 119* 127*  BUN 44* 15  CREATININE 1.34 1.03  CALCIUM 8.6 9.2   Liver Function Tests: No results found for this basename: AST, ALT, ALKPHOS, BILITOT, PROT, ALBUMIN,  in the last 168 hours No results found for this basename: LIPASE, AMYLASE,  in the last 168 hours No results found for this basename: AMMONIA,  in the last 168 hours CBC:  Recent Labs Lab 12/17/13 2126 12/18/13 0525 12/18/13 1432 12/18/13 2045 12/19/13 0600  WBC 10.9* 9.7  --   --   --  HGB 7.9* 8.8* 10.0* 9.1* 10.3*  HCT 24.1* 27.3* 30.8* 28.1* 32.1*  MCV 89.6 89.8  --   --   --   PLT 280 276  --   --   --    Cardiac Enzymes:  Recent Labs Lab 12/18/13 0525 12/18/13 1432 12/19/13 0600  TROPONINI <0.30 0.43* 0.60*   BNP (last 3 results)  Recent Labs  12/17/13 2126  PROBNP 232.9*   CBG:  Recent Labs Lab 12/18/13 0727 12/18/13 1159 12/18/13 1748 12/18/13 2201  12/19/13 0715  GLUCAP 106* 114* 133* 124* 120*    Recent Results (from the past 240 hour(s))  SURGICAL PCR SCREEN     Status: Abnormal   Collection Time    12/18/13  7:10 PM      Result Value Ref Range Status   MRSA, PCR NEGATIVE  NEGATIVE Final   Staphylococcus aureus POSITIVE (*) NEGATIVE Final   Comment:            The Xpert SA Assay (FDA     approved for NASAL specimens     in patients over 77 years of age),     is one component of     a comprehensive surveillance     program.  Test performance has     been validated by [employer] for patients greater     than or equal to 98 year old.     It is not intended     to diagnose infection nor to     guide or monitor treatment.     Studies: Dg Chest Port 1 View  12/17/2013   CLINICAL DATA:  Chest pain.  EXAM: PORTABLE CHEST - 1 VIEW  COMPARISON:  11/24/2010.  FINDINGS: The heart is borderline enlarged but stable. There is tortuosity and calcification of the thoracic aorta. Stable surgical changes related to previous bypass surgery. The lungs are clear. No pleural effusion or pneumothorax. The bony thorax is intact.  IMPRESSION: No acute cardiopulmonary findings.   Electronically Signed   By: Kalman Jewels M.D.   On: 12/17/2013 21:37    Scheduled Meds: . atorvastatin  40 mg Oral Daily  . chlorthalidone  25 mg Oral Daily  . diphenhydrAMINE  25 mg Oral Once  . insulin aspart  0-5 Units Subcutaneous QHS  . insulin aspart  0-9 Units Subcutaneous TID WC  . isosorbide mononitrate  180 mg Oral Daily  . metoprolol  100 mg Oral BID  . mometasone-formoterol  2 puff Inhalation BID  . pantoprazole  40 mg Oral Daily  . sodium chloride  3 mL Intravenous Q12H  . verapamil  240 mg Oral Daily  . zolpidem  5 mg Oral QHS   Continuous Infusions:      Time spent: 35 minutes    Yehoshua Vitelli  [facility] Pager [phone]. If 7PM-7AM, please contact night-coverage at [email or web address], password [facility] 12/19/2013, 11:09 AM  LOS: 2  days

## 2013-12-19 NOTE — Progress Notes (Signed)
       Patient Name: Henry [name] Date of Encounter: 12/19/2013    SUBJECTIVE: The patient does have a good night. He has mild shortness of breath with getting washed up this morning. No chest discomfort.  TELEMETRY:  Normal sinus rhythm Filed Vitals:   12/18/13 0542 12/18/13 0832 12/18/13 1349 12/18/13 2206  BP: 106/50  103/56 117/69  Pulse: 81  73 77  Temp: 97.8 F (36.6 C)  97.8 F (36.6 C) 98.5 F (36.9 C)  TempSrc: Oral  Oral Oral  Resp: 19  19 18   Height:      Weight:      SpO2: 100% 95% 98% 98%    Intake/Output Summary (Last 24 hours) at 12/19/13 0623 Last data filed at 12/18/13 2025  Gross per 24 hour  Intake   1540 ml  Output   1375 ml  Net    165 ml   LABS: Basic Metabolic Panel:  Recent Labs  12/17/13 2126  NA 143  K 4.2  CL 107  CO2 22  GLUCOSE 119*  BUN 44*  CREATININE 1.34  CALCIUM 8.6   CBC:  Recent Labs  12/17/13 2126 12/18/13 0525 12/18/13 1432 12/18/13 2045  WBC 10.9* 9.7  --   --   HGB 7.9* 8.8* 10.0* 9.1*  HCT 24.1* 27.3* 30.8* 28.1*  MCV 89.6 89.8  --   --   PLT 280 276  --   --    Cardiac Enzymes:  Recent Labs  12/18/13 0525 12/18/13 1432  TROPONINI <0.30 0.43*   BNP    Component Value Date/Time   PROBNP 232.9* 12/17/2013 2126   Hemoglobin A1C:  Recent Labs  12/18/13 0525  HGBA1C 6.1*   Fasting Lipid Panel: No results found for this basename: CHOL, HDL, LDLCALC, TRIG, CHOLHDL, LDLDIRECT,  in the last 72 hours  Radiology/Studies:   no new data  Physical Exam: Blood pressure 117/69, pulse 77, temperature 98.5 F (36.9 C), temperature source Oral, resp. rate 18, height 5\' 8"  (1.727 m), weight 186 lb (84.369 kg), SpO2 98.00%. Weight change:   Wt Readings from Last 3 Encounters:  12/17/13 186 lb (84.369 kg)  12/17/13 186 lb (84.369 kg)  08/12/13 187 lb (84.823 kg)   Decreased breath sounds at the right base There is no pericardial friction rub. An S4 gallop is audible There is no peripheral edema    ASSESSMENT:  1. Prolonged angina pectoris, resolved. Minimal troponin elevation yesterday. Troponin this a.m. is still pending. 2. Gastrointestinal bleeding with significant anemia, requiring transfusion 3. Dyspnea has improved with transfusion. The patient is at risk for acute diastolic heart failure   Plan:  1. Continue the patient's usual diuretic regimen assuming blood pressure will tolerate . 2. If recurrent angina free with nitroglycerin sublingually 3. We plan no specific ischemic evaluation unless a surprising elevation and this mornings troponin is noted.    Signed, Belva Crome III 12/19/2013, 6:23 AM

## 2013-12-19 NOTE — Progress Notes (Signed)
Echo Lab  2D Echocardiogram completed.  [place], RDCS 12/19/2013 11:03 AM

## 2013-12-19 NOTE — Op Note (Signed)
[facility] [place], [id number]   ENDOSCOPY PROCEDURE REPORT  PATIENT: Henry [name], Henry [name]  MR#: [id number] BIRTHDATE: 07-12-1941 , 73  yrs. old GENDER: Male ENDOSCOPIST: Lafayette Dragon, MD REFERRED BY:  Dr Delane Ginger.Dhungel PROCEDURE DATE:  12/19/2013 PROCEDURE:  EGD w/ control of bleeding ASA CLASS:     Class III INDICATIONS:  upper GI bleed following endoscopy and gastric polypectomy one week ago at [facility], restarted Coumadin and Plavix right away, Developed melenic stools and anemia, readmitted to control bleeding, Plavix and Coumadin stopped MEDICATIONS: These medications were titrated to patient response per physician's verbal order, Fentanyl 75 mcg IV, and Versed 6 mg IV TOPICAL ANESTHETIC: Cetacaine Spray  DESCRIPTION OF PROCEDURE: After the risks benefits and alternatives of the procedure were thoroughly explained, informed consent was obtained.  The Pentax Gastroscope [id number] endoscope was introduced through the mouth and advanced to the second portion of the duodenum. Without limitations.  The instrument was slowly withdrawn as the mucosa was fully examined.      Esophagus: Esophageal mucosa was normal throughout the proximal mid and distal esophagus. Squamocolumnar:  junction was normal. There was no stricture or hiatal hernia. The stomach, and there was no blood in the stomach. There were 2 endoclips in the gastric body overlying post polypectomy site which appeared to have a small normal nonbleeding ulcer next to the clips was a polyp which was slowly oozing bright red blood the blood was coming from underneath the polyp which was very soft and partially detached. Epinephrine was injected at the base of the polyp. Total of 2 cc of epinephrine in 2 separate sites. With complete cessation of bleeding. Polyp was then removed with the hot snare and two endoclips were placed over the post-polypectomy site. Another polyp next  to it was also bleeding and was removed after being injected with epinephrine 1 cc and removed with the hot snare. Both polyps were sent to pathology. Rest of the stomach appeared normal except for multiple fundic gland polyps. The gastric outlet was normal Duodenum: Duodenal bulb and descending duodenum was normal The scope was then withdrawn from the patient and the procedure completed.  COMPLICATIONS: There were no complications. ENDOSCOPIC IMPRESSION: ablation off all bleeding gastric polyp x2 using epinephrine, hot snare and endoclips Post remote gastric polypectomy site with 2 endoclips still attached not bleeding Multiple fundic gland polyps  I think the initial post polypectomy bleeding was originating from the all polyp removed all at the first endoscopy. The additional 2 polyps which were bleeding today or partially detached possibly by an endoscope -induced trauma  RECOMMENDATIONS: Await pathology of the polyps Hold Coumadin and Plavix for 10 days Follow H&H Clear liquids today REPEAT EXAM: for EGD pending biopsy results.  eSigned:  Lafayette Dragon, MD 12/19/2013 9:17 AM   CC:  PATIENT NAME:  Henry [name], Henry [name] MR#: [id number]

## 2013-12-20 ENCOUNTER — Encounter (HOSPITAL_COMMUNITY): Payer: Self-pay | Admitting: Internal Medicine

## 2013-12-20 DIAGNOSIS — I208 Other forms of angina pectoris: Secondary | ICD-10-CM | POA: Diagnosis present

## 2013-12-20 DIAGNOSIS — Z7901 Long term (current) use of anticoagulants: Secondary | ICD-10-CM

## 2013-12-20 DIAGNOSIS — D649 Anemia, unspecified: Secondary | ICD-10-CM | POA: Diagnosis present

## 2013-12-20 LAB — GLUCOSE, CAPILLARY
GLUCOSE-CAPILLARY: 118 mg/dL — AB (ref 70–99)
Glucose-Capillary: 129 mg/dL — ABNORMAL HIGH (ref 70–99)

## 2013-12-20 NOTE — Discharge Instructions (Addendum)
Gastrointestinal Bleeding Gastrointestinal bleeding is bleeding somewhere along the path that food travels through the body (digestive tract). This path is anywhere between the mouth and the opening of the butt (anus). You may have blood in your throw up (vomit) or in your poop (stools). If there is a lot of bleeding, you may need to stay in the hospital. [place]  Only take medicine as told by your doctor.  Eat foods with fiber such as whole grains, fruits, and vegetables. You can also try eating 1 to 3 prunes a day.  Drink enough fluids to keep your pee (urine) clear or pale yellow. GET HELP RIGHT AWAY IF:   Your bleeding gets worse.  You feel dizzy, weak, or you pass out (faint).  You have bad cramps in your back or belly (abdomen).  You have large blood clumps (clots) in your poop.  Your problems are getting worse. MAKE SURE YOU:   Understand these instructions.  Will watch your condition.  Will get help right away if you are not doing well or get worse. Document Released: 04/09/2008 Document Revised: 06/17/2012 Document Reviewed: 06/10/2011 [facility] Patient Information 2014 [place], [place].  Angina Pectoris Angina pectoris, often just called angina, is extreme discomfort in your chest, neck, or arm caused by a lack of blood in the middle and thickest layer of your heart wall (myocardium). It may feel like tightness or heavy pressure. It may feel like a crushing or squeezing pain. Some people say it feels like gas or indigestion. It may go down your shoulders, back, and arms. Some people may have symptoms other than pain. These symptoms include fatigue, shortness of breath, cold sweats, or nausea. There are four different types of angina:  Stable angina Stable angina usually occurs in episodes of predictable frequency and duration. It usually is brought on by physical activity, emotional stress, or excitement. These are all times when the myocardium needs more oxygen. Stable  angina usually lasts a few minutes and often is relieved by taking a medicine that can be taken under your tongue (sublingually). The medicine is called nitroglycerin. Stable angina is caused by a buildup of plaque inside the arteries, which restricts blood flow to the heart muscle (atherosclerosis).  Unstable angina Unstable angina can occur even when your body experiences little or no physical exertion. It can occur during sleep. It can also occur at rest. It can suddenly increase in severity or frequency. It might not be relieved by sublingual nitroglycerin. It can last up to 30 minutes. The most common cause of unstable angina is a blood clot that has developed on the top of plaque buildup inside a coronary artery. It can lead to a heart attack if the blood clot completely blocks the artery.  Microvascular angina This type of angina is caused by a disorder of tiny blood vessels called arterioles. Microvascular angina is more common in women. The pain may be more severe and last longer than other types of angina pectoris.  Prinzmetal or variant angina This type of angina pectoris usually occurs when your body experiences little or no physical exertion. It especially occurs in the early morning hours. It is caused by a spasm of your coronary artery. HOME CARE INSTRUCTIONS   Only take over-the-counter and prescription medicines as directed by your caregiver.  Stay active or increase your exercise as directed by your caregiver.  Limit strenuous activity as directed by your caregiver.  Limit heavy lifting as directed by your caregiver.  Maintain a healthy  weight.  Learn about and eat heart-healthy foods.  Do not smoke. SEEK IMMEDIATE MEDICAL CARE IF:  You experience the following symptoms:  Chest, neck, deep shoulder, or arm pain or discomfort that lasts more than a few minutes.  Chest, neck, deep shoulder, or arm pain or discomfort that goes away and comes back, repeatedly.  Heavy  sweating with discomfort, without a noticeable cause.  Shortness of breath or difficulty breathing.  Angina that does not get better after a few minutes of rest or after taking sublingual nitroglycerin. These can all be symptoms of a heart attack, which is a medical emergency! Get medical help at once. Call your local emergency service (911 in [place] immediately. Do not  drive yourself to the hospital and do not  wait to for your symptoms to go away. MAKE SURE YOU:  Understand these instructions.  Will watch your condition.  Will get help right away if you are not doing well or get worse. Document Released: 07/01/2005 Document Revised: 06/17/2012 Document Reviewed: 04/09/2012 [facility] Patient Information 2014 [place], [place].

## 2013-12-20 NOTE — Progress Notes (Signed)
       Patient Name: Henry [name] Date of Encounter: 12/20/2013    SUBJECTIVE:No angina since transfusion.  TELEMETRY:  NSR Filed Vitals:   12/19/13 2041 12/19/13 2201 12/19/13 2210 12/20/13 0553  BP:  119/61 119/61 112/62  Pulse: 84 81 81 78  Temp:  98.2 F (36.8 C)  98.4 F (36.9 C)  TempSrc:  Oral  Oral  Resp: 18 17  17   Height:      Weight:      SpO2: 100% 96%  96%    Intake/Output Summary (Last 24 hours) at 12/20/13 0818 Last data filed at 12/20/13 0552  Gross per 24 hour  Intake   1245 ml  Output   1175 ml  Net     70 ml   LABS: Basic Metabolic Panel:  Recent Labs  12/17/13 2126 12/19/13 0600  NA 143 141  K 4.2 4.0  CL 107 102  CO2 22 25  GLUCOSE 119* 127*  BUN 44* 15  CREATININE 1.34 1.03  CALCIUM 8.6 9.2   CBC:  Recent Labs  12/17/13 2126 12/18/13 0525  12/19/13 0600 12/19/13 1424  WBC 10.9* 9.7  --   --   --   HGB 7.9* 8.8*  < > 10.3* 9.3*  HCT 24.1* 27.3*  < > 32.1* 28.7*  MCV 89.6 89.8  --   --   --   PLT 280 276  --   --   --   < > = values in this interval not displayed. Cardiac Enzymes:  Recent Labs  12/18/13 1432 12/19/13 0600 12/19/13 1611  TROPONINI 0.43* 0.60* 0.52*    Hemoglobin A1C:  Recent Labs  12/18/13 0525  HGBA1C 6.1*     Radiology/Studies:  No new data  Physical Exam: Blood pressure 112/62, pulse 78, temperature 98.4 F (36.9 C), temperature source Oral, resp. rate 17, height 5\' 8"  (1.727 m), weight 186 lb (84.369 kg), SpO2 96.00%. Weight change:   Wt Readings from Last 3 Encounters:  12/17/13 186 lb (84.369 kg)  12/17/13 186 lb (84.369 kg)  08/12/13 187 lb (84.823 kg)    2/6 systolic murmur Chest clear  ASSESSMENT:  1. CAD with resolution of angina upon transfusion  Plan:  1. Per GI, delay resumption of coumadin for 7-10 days. 2. I believe that plavix can be discontinued as last stent was 8 years ago. 3. Please call if we can help further.  Signed, Belva Crome III 12/20/2013, 8:18  AM

## 2013-12-20 NOTE — Discharge Summary (Signed)
Physician Discharge Summary  MEAD SLANE EGB:[id number] DOB: 10-Jan-1941 DOA: 12/17/2013  PCP: Sherrie Mustache, MD  Admit date: 12/17/2013 Discharge date: 12/20/2013  Time spent:40 minutes  Recommendations for Outpatient Follow-up:  1. Discharge home with PCP follow up in 1 week. Please check H&H . Resume coumadin in 10 days from discharge. plavix has been discontinued. 2. Follow up with GI ( Dr Britta Mccreedy ) in 2 weeks. Please follow pathology of the polyps. 3. Follow up with cardiologist Dr Domenic Polite in 2-3 weeks.   Discharge Diagnoses:  Principal Problem:   UGI bleed  Active Problems:   Essential hypertension, benign   Recurrent pulmonary embolism - 01/2011 & 11/2011   Type 2 diabetes mellitus   CAD s/p CABG 2005 (1/3 grafts patent), DES to Cx 2006, NSTEMI 2012 managed medically   Pulmonary nodule   Lupus anticoagulant positive   CKD (chronic kidney disease), stage II   NSTEMI (non-ST elevated myocardial infarction)   Symptomatic anemia   Discharge Condition: fair  Diet recommendation: cardiac  Filed Weights   12/17/13 2051  Weight: 84.369 kg (186 lb)    History of present illness:  Please refer to admission h&P for details, but in brief, 73 year old male with history of extensive CAD status post multivessel CABG, cardiac cath, history of NSTEMI, chronic anginal symptoms, type 2 diabetes mellitus, asthma and history of pulmonary embolism chronic Coumadin who recently underwent EGD and colonoscopy for ? Guaiac-positive stools in [place] where he was found to have 3 gastric polyps that were removed (as per patient). He presented to the ED with chest pain and dizziness for 2 days with some shortness of breath. Patient found to have drop in his hemoglobin to 8 from 14 about 6 weeks back with guaiac-positive stool. Patient admitted to telemetry and ruled out for ACS and GI bleed.   Hospital Course:  Upper GI bleed  Noted for significant drop in H&H. Patient on both Plavix and  Coumadin which were resumed immediately after recent EGD and colonoscopy. Patient reports having 3 polyps removed from the stomach which were essentially normal.  -Plavix and Coumadin held. EGD done by GI  On 12/19/13 showing 2 bleeding  polyps which were ablated. Also showed Tolerated procedure well. H&H has started to improve.. Continue daily PPI.  Needs H&H monitored as outpt -Recommend to hold Coumadin  for next 10 days.plavix discontinued as per cardiology since his last stent was 8 years back. patient is clinically stable and does not have further symptoms. He will be discharged home with outpt follow up.  Marland Kitchen Unstable angina/ NSTEMI  Patient presented with chest pain symptoms and had mild ST depression in inferior leads on EKG. Patient has history of chronic angina and follows up with cardiology as outpatient. Patient also has mild elevation of troponin suggestive of demand ischemia from underlying anemia. Seen by cardiology and recommended medical management. Continue Imdur and metoprolol. Continue statin. Continue sublingual nitrate.  2D  ECHO done with normal EF and no wall motion abnormalities. Cardiology recommend medical management . With  improvement in his hb, chest pain has resolved. Cardiology recommend to d/c plavix given Gi bleed and since his last stent was 8 years ago.  Diabetes mellitus type 2  Resume metformin.  Code Status: full code  Family Communication: Wife at bedside  Disposition Plan: home    Consultants:  GI  Cardiology   Procedures:  EGD 6/7 2 D echo   Antibiotics:  none   Discharge Exam: Filed Vitals:   12/20/13  0553  BP: 112/62  Pulse: 78  Temp: 98.4 F (36.9 C)  Resp: 17    General: Elderly male in no acute distress  HEENT: No pallor, moist oral mucosa  Chest: Clear to auscultation bilaterally, no added sounds  CVS: Normal S1 and S2, no murmurs or gallop  Abdomen: Soft, nontender nondistended bowel sounds present  Extremities: Warm, no  edema  CNS: AAO x3   Discharge Instructions You were cared for by a hospitalist during your hospital stay. If you have any questions about your discharge medications or the care you received while you were in the hospital after you are discharged, you can call the unit and asked to speak with the hospitalist on call if the hospitalist that took care of you is not available. Once you are discharged, your primary care physician will handle any further medical issues. Please note that NO REFILLS for any discharge medications will be authorized once you are discharged, as it is imperative that you return to your primary care physician (or establish a relationship with a primary care physician if you do not have one) for your aftercare needs so that they can reassess your need for medications and monitor your lab values.     Medication List    STOP taking these medications       clopidogrel 75 MG tablet  Commonly known as:  PLAVIX     warfarin 5 MG tablet  Commonly known as:  COUMADIN      TAKE these medications       acetaminophen 325 MG tablet  Commonly known as:  TYLENOL  Take 650 mg by mouth every 4 (four) hours as needed. For pain     ADVAIR DISKUS 250-50 MCG/DOSE Aepb  Generic drug:  Fluticasone-Salmeterol  Inhale 1 puff into the lungs 2 (two) times daily.     atorvastatin 40 MG tablet  Commonly known as:  LIPITOR  Take 40 mg by mouth daily.     chlorthalidone 25 MG tablet  Commonly known as:  HYGROTON  Take 25 mg by mouth daily.     Co Q 10 10 MG Caps  Take 200 mg by mouth daily.     esomeprazole 40 MG capsule  Commonly known as:  NEXIUM  Take 40 mg by mouth daily before breakfast.     fish oil-omega-3 fatty acids 1000 MG capsule  Take 2 g by mouth daily.     fluticasone 50 MCG/ACT nasal spray  Commonly known as:  FLONASE  Place 2 sprays into the nose daily.     IRON COMPLEX PO  Take 1 capsule by mouth 2 (two) times daily.     isosorbide mononitrate 120 MG 24  hr tablet  Commonly known as:  IMDUR  Take 180 mg by mouth daily.     metFORMIN 500 MG tablet  Commonly known as:  GLUCOPHAGE  Take 500 mg by mouth 2 (two) times daily with a meal.     metoprolol 100 MG tablet  Commonly known as:  LOPRESSOR  Take 100 mg by mouth 2 (two) times daily.     mometasone 50 MCG/ACT nasal spray  Commonly known as:  NASONEX  Place 2 sprays into the nose daily.     nitroGLYCERIN 0.4 MG SL tablet  Commonly known as:  NITROSTAT  Place 1 tablet (0.4 mg total) under the tongue every 5 (five) minutes as needed. For chest pain     verapamil 240 MG CR tablet  Commonly known  as:  CALAN-SR  Take 1 tablet (240 mg total) by mouth daily.     zolpidem 5 MG tablet  Commonly known as:  AMBIEN  Take 5 mg by mouth at bedtime.       Allergies  Allergen Reactions  . Ace Inhibitors     Cough       Follow-up Information   Follow up with Sherrie Mustache, MD In 1 week. (check H&H)    Specialty:  Family Medicine   Contact information:   [address] [place] [address] [phone]       Follow up with Cathe Mons, MD. Schedule an appointment as soon as possible for a visit in 2 weeks.   Specialty:  Internal Medicine   Contact information:   [place] [address] [phone]       Follow up with Rozann Lesches, MD. Schedule an appointment as soon as possible for a visit in 3 weeks.   Specialty:  Cardiology   Contact information:   [place] [address] [phone]        The results of significant diagnostics from this hospitalization (including imaging, microbiology, ancillary and laboratory) are listed below for reference.    Significant Diagnostic Studies: Dg Chest Port 1 View  12/17/2013   CLINICAL DATA:  Chest pain.  EXAM: PORTABLE CHEST - 1 VIEW  COMPARISON:  11/24/2010.  FINDINGS: The heart is borderline enlarged but stable. There is tortuosity and calcification of the thoracic aorta.  Stable surgical changes related to previous bypass surgery. The lungs are clear. No pleural effusion or pneumothorax. The bony thorax is intact.  IMPRESSION: No acute cardiopulmonary findings.   Electronically Signed   By: Kalman Jewels M.D.   On: 12/17/2013 21:37    Microbiology: Recent Results (from the past 240 hour(s))  SURGICAL PCR SCREEN     Status: Abnormal   Collection Time    12/18/13  7:10 PM      Result Value Ref Range Status   MRSA, PCR NEGATIVE  NEGATIVE Final   Staphylococcus aureus POSITIVE (*) NEGATIVE Final   Comment:            The Xpert SA Assay (FDA     approved for NASAL specimens     in patients over 64 years of age),     is one component of     a comprehensive surveillance     program.  Test performance has     been validated by [employer] for patients greater     than or equal to 30 year old.     It is not intended     to diagnose infection nor to     guide or monitor treatment.     Labs: Basic Metabolic Panel:  Recent Labs Lab 12/17/13 2126 12/19/13 0600  NA 143 141  K 4.2 4.0  CL 107 102  CO2 22 25  GLUCOSE 119* 127*  BUN 44* 15  CREATININE 1.34 1.03  CALCIUM 8.6 9.2   Liver Function Tests: No results found for this basename: AST, ALT, ALKPHOS, BILITOT, PROT, ALBUMIN,  in the last 168 hours No results found for this basename: LIPASE, AMYLASE,  in the last 168 hours No results found for this basename: AMMONIA,  in the last 168 hours CBC:  Recent Labs Lab 12/17/13 2126 12/18/13 0525 12/18/13 1432 12/18/13 2045 12/19/13 0600 12/19/13 1424  WBC 10.9* 9.7  --   --   --   --  HGB 7.9* 8.8* 10.0* 9.1* 10.3* 9.3*  HCT 24.1* 27.3* 30.8* 28.1* 32.1* 28.7*  MCV 89.6 89.8  --   --   --   --   PLT 280 276  --   --   --   --    Cardiac Enzymes:  Recent Labs Lab 12/18/13 0525 12/18/13 1432 12/19/13 0600 12/19/13 1611  TROPONINI <0.30 0.43* 0.60* 0.52*   BNP: BNP (last 3 results)  Recent Labs  12/17/13 2126  PROBNP 232.9*    CBG:  Recent Labs Lab 12/19/13 0715 12/19/13 1144 12/19/13 1703 12/19/13 2208 12/20/13 0751  GLUCAP 120* 116* 99 149* 129*       Signed:  Reagan Behlke  [facility] 12/20/2013, 10:23 AM

## 2013-12-21 ENCOUNTER — Encounter: Payer: Self-pay | Admitting: Internal Medicine

## 2013-12-28 ENCOUNTER — Encounter: Payer: Self-pay | Admitting: Cardiology

## 2013-12-28 NOTE — Progress Notes (Signed)
Clinical Summary [name] is a medically complex 73 y.o.male presenting for a post hospital followup. I last saw him in January. He was recently hospitalized with upper GI bleed following a recent EGD and colonoscopy with polypectomy. Plavix and Coumadin had been held. He had a followup EGD that showed 2 bleeding polyps or ablated. He also received packed red cell transfusions. During this episode he did experience angina symptoms and was managed conservatively following cardiology consultation.  Lab work showed improvement in hemoglobin from 7.9 up to 10.3. Cardiac enzymes were mildly abnormal with troponin I level peaking at 0.60. BUN 15 and creatinine 1.0.  Veazey with his wife today. States that he has had followup with Dr. Silva Bandy, and is back on Coumadin, INR pending for tomorrow at his office. He is not reporting any chest pain or significant shortness of breath, has been feeling better by the day. States it hemoglobin has been rechecked and has been greater than 10. He has not noticed any bleeding.  I agree with discontinuation of Plavix at this point in light of recent bleeding. He has a history of graft disease and prior DES, had been maintained on Plavix longer-term prior to the recent episode.   Allergies  Allergen Reactions  . Ace Inhibitors     Cough    Current Outpatient Prescriptions  Medication Sig Dispense Refill  . acetaminophen (TYLENOL) 325 MG tablet Take 650 mg by mouth every 4 (four) hours as needed. For pain      . atorvastatin (LIPITOR) 40 MG tablet Take 40 mg by mouth daily.      . chlorthalidone (HYGROTON) 25 MG tablet Take 25 mg by mouth daily.      . Coenzyme Q10 (CO Q 10) 10 MG CAPS Take 200 mg by mouth daily.      Marland Kitchen esomeprazole (NEXIUM) 40 MG capsule Take 40 mg by mouth daily before breakfast.      . fish oil-omega-3 fatty acids 1000 MG capsule Take 2 g by mouth daily.       . fluticasone (FLONASE) 50 MCG/ACT nasal spray Place 2 sprays into the nose  daily.        . Fluticasone-Salmeterol (ADVAIR DISKUS) 250-50 MCG/DOSE AEPB Inhale 1 puff into the lungs 2 (two) times daily.        . Iron Combinations (IRON COMPLEX PO) Take 1 capsule by mouth 2 (two) times daily.      . isosorbide mononitrate (IMDUR) 120 MG 24 hr tablet Take 180 mg by mouth daily.      . metFORMIN (GLUCOPHAGE) 500 MG tablet Take 500 mg by mouth 2 (two) times daily with a meal.      . metoprolol (LOPRESSOR) 100 MG tablet Take 100 mg by mouth 2 (two) times daily.      . mometasone (NASONEX) 50 MCG/ACT nasal spray Place 2 sprays into the nose daily.      . nitroGLYCERIN (NITROSTAT) 0.4 MG SL tablet Place 1 tablet (0.4 mg total) under the tongue every 5 (five) minutes as needed. For chest pain  25 tablet  1  . verapamil (CALAN-SR) 240 MG CR tablet Take 1 tablet (240 mg total) by mouth daily.  30 tablet  6  . warfarin (COUMADIN) 5 MG tablet Take 5 mg by mouth as directed. MANAGED BY PMD      . zolpidem (AMBIEN) 5 MG tablet Take 5 mg by mouth at bedtime.        No current facility-administered medications for  this visit.    Past Medical History  Diagnosis Date  . Coronary atherosclerosis of native coronary artery     a. NSTEMI 2005 s/p CABG (LIMA - LAD, SVG - OM, SVG - PDA). b. DES to Cx 02/2005. c. NSTEMI 07/2010 due to occ Cx - med rx. Known occluded vein grafts.  . Essential hypertension, benign   . Type 2 diabetes mellitus   . Dyslipidemia   . ACE inhibitor intolerance   . Asthma   . Recurrent pulmonary embolism     a. 01/2011. b. Bilateral PE 11/2011 - + lupus anticoagulant preliminary testing. Felt to require lifelong Coumadin.  . Pulmonary nodule     RML by CT 11/2011, stable compared to 2012.  Marland Kitchen Cholelithiasis   . Pulmonary HTN     Echo 11/2011 at time of PE. PA pressure not assessed on 03/2012 echo but RV was back to normal.  . Lupus anticoagulant positive   . GERD (gastroesophageal reflux disease)     Past Surgical History  Procedure Laterality Date  . Coronary  artery bypass graft  2005     LIMA to LAD, SVG to OM, SVG to RCA  . L4-l5 laminectomy    . Colonoscopy    . Endooscopy    . Esophagogastroduodenoscopy N/A 12/19/2013    Procedure: ESOPHAGOGASTRODUODENOSCOPY (EGD);  Surgeon: Lafayette Dragon, MD;  Location: [facility] END[facility]COPY;  Service: Endoscopy;  Laterality: N/A;    Social History Mr. Boulay reports that he quit smoking about 41 years ago. His smoking use included Cigarettes. He has a 9 pack-year smoking history. His smokeless tobacco use includes Chew. Mr. Lowrimore reports that he does not drink alcohol.  Review of Systems No palpitations, dizziness, syncope. No hematemesis, no melena or hematochezia. Stable appetite. Weakness improving. Other systems reviewed and negative.  Physical Examination Filed Vitals:   12/29/13 0932  BP: 148/68  Pulse: 67   Filed Weights   12/29/13 0932  Weight: 185 lb 6.4 oz (84.097 kg)    Comfortable at rest.  HEENT: Conjunctiva and lids normal, oropharynx clear with poor dentition.  Neck: Supple, no elevated JVP or carotid bruits, no thyromegaly.  Lungs: Clear to auscultation, decreased breath sounds, nonlabored breathing at rest.  Cardiac: Regular rate and rhythm, no S3 or significant systolic murmur, no pericardial rub.  Abdomen: Soft, nontender, bowel sounds present.  Extremities: No pitting edema, distal pulses 1-2+.  Skin: Warm and dry.  Musculoskeletal: No kyphosis.  Neuropsychiatric: Alert and oriented x3, affect grossly appropriate.   Problem List and Plan   Coronary atherosclerosis of native coronary artery Native vessel and graft disease with prior DES as well, documented above. Agree with discontinuation of Plavix, particularly in light of GI bleeding and necessity for long-term Coumadin. Recent minor enzyme elevation in the setting of anemia and myocardial strain (type II event). Followup arranged in 3 months.  GI bleed Outlined above, currently stable. Hemoglobin being followed by Dr. Edrick Oh  at this point.  Recurrent pulmonary embolism On lifelong Coumadin, agree with resumption. Patient for followup INR per Dr. Edrick Oh tomorrow.  Dyslipidemia Patient continues on Lipitor.  CKD (chronic kidney disease), stage II Recent creatinine 1.0.    Satira Sark, M.D., F.A.C.C.

## 2013-12-29 ENCOUNTER — Encounter: Payer: Self-pay | Admitting: Cardiology

## 2013-12-29 ENCOUNTER — Ambulatory Visit (INDEPENDENT_AMBULATORY_CARE_PROVIDER_SITE_OTHER): Payer: Medicare Other | Admitting: Cardiology

## 2013-12-29 VITALS — BP 148/68 | HR 67 | Ht 68.0 in | Wt 185.4 lb

## 2013-12-29 DIAGNOSIS — N182 Chronic kidney disease, stage 2 (mild): Secondary | ICD-10-CM

## 2013-12-29 DIAGNOSIS — I2510 Atherosclerotic heart disease of native coronary artery without angina pectoris: Secondary | ICD-10-CM

## 2013-12-29 DIAGNOSIS — E785 Hyperlipidemia, unspecified: Secondary | ICD-10-CM

## 2013-12-29 DIAGNOSIS — K922 Gastrointestinal hemorrhage, unspecified: Secondary | ICD-10-CM

## 2013-12-29 DIAGNOSIS — I2699 Other pulmonary embolism without acute cor pulmonale: Secondary | ICD-10-CM

## 2013-12-29 NOTE — Assessment & Plan Note (Signed)
Recent creatinine 1.0.

## 2013-12-29 NOTE — Assessment & Plan Note (Signed)
Outlined above, currently stable. Hemoglobin being followed by Dr. Edrick Oh at this point.

## 2013-12-29 NOTE — Assessment & Plan Note (Signed)
On lifelong Coumadin, agree with resumption. Patient for followup INR per Dr. Edrick Oh tomorrow.

## 2013-12-29 NOTE — Assessment & Plan Note (Signed)
Native vessel and graft disease with prior DES as well, documented above. Agree with discontinuation of Plavix, particularly in light of GI bleeding and necessity for long-term Coumadin. Recent minor enzyme elevation in the setting of anemia and myocardial strain (type II event). Followup arranged in 3 months.

## 2013-12-29 NOTE — Patient Instructions (Signed)
Your physician recommends that you schedule a follow-up appointment in: 3 months. Your physician recommends that you continue on your current medications as directed. Please refer to the Current Medication list given to you today. 

## 2013-12-29 NOTE — Assessment & Plan Note (Signed)
Patient continues on Lipitor.

## 2014-02-21 ENCOUNTER — Ambulatory Visit: Payer: Medicare Other | Admitting: Cardiology

## 2014-03-02 ENCOUNTER — Other Ambulatory Visit: Payer: Self-pay | Admitting: Cardiology

## 2014-03-14 ENCOUNTER — Institutional Professional Consult (permissible substitution): Payer: Medicare Other | Admitting: Pulmonary Disease

## 2014-03-15 ENCOUNTER — Ambulatory Visit (INDEPENDENT_AMBULATORY_CARE_PROVIDER_SITE_OTHER): Payer: Medicare Other | Admitting: Pulmonary Disease

## 2014-03-15 ENCOUNTER — Encounter: Payer: Self-pay | Admitting: Pulmonary Disease

## 2014-03-15 ENCOUNTER — Ambulatory Visit (INDEPENDENT_AMBULATORY_CARE_PROVIDER_SITE_OTHER)
Admission: RE | Admit: 2014-03-15 | Discharge: 2014-03-15 | Disposition: A | Discharge status: Home / Self Care | Payer: Medicare Other | Source: Ambulatory Visit | Attending: Pulmonary Disease | Admitting: Pulmonary Disease

## 2014-03-15 VITALS — BP 124/62 | HR 63 | Temp 98.6°F | Ht 68.0 in | Wt 180.2 lb

## 2014-03-15 DIAGNOSIS — R0989 Other specified symptoms and signs involving the circulatory and respiratory systems: Secondary | ICD-10-CM

## 2014-03-15 DIAGNOSIS — R0609 Other forms of dyspnea: Secondary | ICD-10-CM

## 2014-03-15 DIAGNOSIS — R0600 Dyspnea, unspecified: Secondary | ICD-10-CM

## 2014-03-15 DIAGNOSIS — R911 Solitary pulmonary nodule: Secondary | ICD-10-CM

## 2014-03-15 DIAGNOSIS — Z23 Encounter for immunization: Secondary | ICD-10-CM

## 2014-03-15 DIAGNOSIS — J449 Chronic obstructive pulmonary disease, unspecified: Secondary | ICD-10-CM | POA: Insufficient documentation

## 2014-03-15 NOTE — Progress Notes (Signed)
Quick Note:  Spoke with pt, he is aware of results and recs. Nothing further needed. ______

## 2014-03-15 NOTE — Assessment & Plan Note (Signed)
Continue lifelong anticoagulation.  

## 2014-03-15 NOTE — Patient Instructions (Addendum)
Keep taking the Breo and the Spiriva as you are doing for now We will contact you about our clinical trial for COPD Take mucinex twice per day Stay active and exercise regularly We will see you back in 4-6 weeks or sooner if needed

## 2014-03-15 NOTE — Assessment & Plan Note (Signed)
COPD: GOLD Grade C Combined recommendations from the [employer], [employer], Investment banker, corporate, [place] (Qaseem A et al, Ann Intern Med. 2011;155(3):179) recommends tobacco cessation, pulmonary rehab (for symptomatic patients with an FEV1 < 50% predicted), supplemental oxygen (for patients with SaO2 <88% or paO2 <55), and appropriate bronchodilator therapy.  In regards to long acting bronchodilators, they recommend monotherapy (FEV1 60-80% with symptoms weak evidence, FEV1 with symptoms <60% strong evidence), or combination therapy (FEV1 <60% with symptoms, strong recommendation, moderate evidence).  One should also provide patients with annual immunizations and consider therapy for prevention of COPD exacerbations (ie. roflumilast or azithromycin) when appopriate.  -O2 therapy: not indicated -Immunizations: Flu and Prevnar given today -Tobacco use: Advised at length to quit -Exercise: encouraged regular exercising -Bronchodilator therapy: Continue Breo and Spiriva -Exacerbation prevention: Continue Breo and Spiriva for now, if recurrence then he may need Daliresp  He would be a good candidate for our IMPACT study. I discussed with him today and he is interested

## 2014-03-15 NOTE — Progress Notes (Signed)
Subjective:    Patient ID: Henry [name], male    DOB: 04-Feb-1941, 73 y.o.   MRN: [id number]  HPI  Henry [name] is here to see me for COPD.  Since January he has had pneumonia twice.  He said that the has suffered from sinus and chest congestion for years, but this time things were worse.  He required four rounds of antibiotics this time too.  He was also treated with prednisone.  The treatment would work, but he had recurrence of the disease.  He says that his physicians suspect COPD.  He also has coronary artery disease, and diabetes.  He has a history of kidney stones, and he has had nasal polypectomy.  He has also had blood clots in his lungs twice.  He was diagnosed with possible lupus anticoagulant around one of these episodes.  He has had trouble breathing for 3-5 years. He has known about having a "touch of asthma" for many years and has been on Advair for many years.  He was changed to Spiriva and Breo around the beginning of August.  He thinks that the Spiriva and Breo help him breathe better but they make him jittery.  He finished prednisone about 10 days ago or so.   On good days he has minimal shortness of breath which is primarily noticeable if he walks too fast.  Carrying groceries or climbing stairs causes.  No one in the family has blood clots or lung problems.   Past Medical History  Diagnosis Date  . Coronary atherosclerosis of native coronary artery     a. NSTEMI 2005 s/p CABG (LIMA - LAD, SVG - OM, SVG - PDA). b. DES to Cx 02/2005. c. NSTEMI 07/2010 due to occ Cx - med rx. Known occluded vein grafts.  . Essential hypertension, benign   . Type 2 diabetes mellitus   . Dyslipidemia   . ACE inhibitor intolerance   . Asthma   . Recurrent pulmonary embolism     a. 01/2011. b. Bilateral PE 11/2011 - + lupus anticoagulant preliminary testing. Felt to require lifelong Coumadin.  . Pulmonary nodule     RML by CT 11/2011, stable compared to 2012.  Marland Kitchen Cholelithiasis   . Pulmonary HTN      Echo 11/2011 at time of PE. PA pressure not assessed on 03/2012 echo but RV was back to normal.  . Lupus anticoagulant positive   . GERD (gastroesophageal reflux disease)   . COPD (chronic obstructive pulmonary disease)   . OSA (obstructive sleep apnea)      Family History  Problem Relation Age of Onset  . Heart disease Father   . Heart disease Sister   . Breast cancer Sister   . Heart disease Brother      History   Social History  . Marital Status: Married    Spouse Name: N/A    Number of Children: N/A  . Years of Education: N/A   Occupational History  . Not on file.   Social History Main Topics  . Smoking status: Former Smoker -- 0.30 packs/day for 30 years    Types: Cigarettes    Quit date: 07/15/1972  . Smokeless tobacco: Current User    Types: Chew     Comment: chews 1/2 pack tobacco per day  . Alcohol Use: No  . Drug Use: No  . Sexual Activity: Not on file   Other Topics Concern  . Not on file   Social History Narrative   Married,  retired.      Allergies  Allergen Reactions  . Ace Inhibitors     Cough     Outpatient Prescriptions Prior to Visit  Medication Sig Dispense Refill  . acetaminophen (TYLENOL) 325 MG tablet Take 650 mg by mouth every 4 (four) hours as needed. For pain      . atorvastatin (LIPITOR) 40 MG tablet Take 80 mg by mouth daily.       . chlorthalidone (HYGROTON) 25 MG tablet TAKE 1 TABLET BY MOUTH DAILY  30 tablet  3  . Coenzyme Q10 (CO Q 10) 10 MG CAPS Take 200 mg by mouth daily.      Marland Kitchen esomeprazole (NEXIUM) 40 MG capsule Take 40 mg by mouth daily before breakfast.      . fish oil-omega-3 fatty acids 1000 MG capsule Take 2 g by mouth daily.       . fluticasone (FLONASE) 50 MCG/ACT nasal spray Place 2 sprays into the nose daily.        . isosorbide mononitrate (IMDUR) 120 MG 24 hr tablet Take 180 mg by mouth daily.      . metFORMIN (GLUCOPHAGE) 500 MG tablet Take 500 mg by mouth 2 (two) times daily with a meal.      . nitroGLYCERIN  (NITROSTAT) 0.4 MG SL tablet Place 1 tablet (0.4 mg total) under the tongue every 5 (five) minutes as needed. For chest pain  25 tablet  1  . verapamil (CALAN-SR) 240 MG CR tablet Take 1 tablet (240 mg total) by mouth daily.  30 tablet  6  . warfarin (COUMADIN) 5 MG tablet Take 5 mg by mouth as directed. MANAGED BY PMD      . zolpidem (AMBIEN) 5 MG tablet Take 5 mg by mouth at bedtime.       . chlorthalidone (HYGROTON) 25 MG tablet Take 25 mg by mouth daily.      . Fluticasone-Salmeterol (ADVAIR DISKUS) 250-50 MCG/DOSE AEPB Inhale 1 puff into the lungs 2 (two) times daily.        . Iron Combinations (IRON COMPLEX PO) Take 1 capsule by mouth 2 (two) times daily.      . metoprolol (LOPRESSOR) 100 MG tablet Take 100 mg by mouth 2 (two) times daily.      . mometasone (NASONEX) 50 MCG/ACT nasal spray Place 2 sprays into the nose daily.       No facility-administered medications prior to visit.      Review of Systems  Constitutional: Negative for fever and unexpected weight change.  HENT: Positive for congestion and dental problem. Negative for ear pain, nosebleeds, postnasal drip, rhinorrhea, sinus pressure, sneezing, sore throat and trouble swallowing.   Eyes: Negative for redness and itching.  Respiratory: Positive for cough, shortness of breath and wheezing. Negative for chest tightness.   Cardiovascular: Positive for chest pain. Negative for palpitations and leg swelling.  Gastrointestinal: Negative for nausea and vomiting.  Genitourinary: Negative for dysuria.  Musculoskeletal: Positive for arthralgias and joint swelling.  Skin: Negative for rash.  Neurological: Negative for headaches.  Hematological: Does not bruise/bleed easily.  Psychiatric/Behavioral: Positive for dysphoric mood. The patient is not nervous/anxious.        Objective:   Physical Exam Filed Vitals:   03/15/14 1039  BP: 124/62  Pulse: 63  Temp: 98.6 F (37 C)  TempSrc: Oral  Height: 5\' 8"  (1.727 m)  Weight:  180 lb 3.2 oz (81.738 kg)  SpO2: 98%   RA  Gen: chronically ill appearing,  no acute distress HEENT: NCAT, PERRL, EOMi, OP clear, neck supple without masses PULM: Few wheezes, good air movement CV: RRR, no mgr, no JVD AB: BS+, soft, nontender, no hsm Ext: warm, no edema, no clubbing, no cyanosis Derm: no rash or skin breakdown Neuro: A&Ox4, CN II-XII intact, strength 5/5 in all 4 extremities       Assessment & Plan:   COPD, moderate COPD: GOLD Grade C Combined recommendations from the [place], [employer] of Chest Physicians, Investment banker, corporate, [employer] Res[employer] Society (Qaseem A et al, Ann Intern Med. 2011;155(3):179) recommends tobacco cessation, pulmonary rehab (for symptomatic patients with an FEV1 < 50% predicted), supplemental oxygen (for patients with SaO2 <88% or paO2 <55), and appropriate bronchodilator therapy.  In regards to long acting bronchodilators, they recommend monotherapy (FEV1 60-80% with symptoms weak evidence, FEV1 with symptoms <60% strong evidence), or combination therapy (FEV1 <60% with symptoms, strong recommendation, moderate evidence).  One should also provide patients with annual immunizations and consider therapy for prevention of COPD exacerbations (ie. roflumilast or azithromycin) when appopriate.  -O2 therapy: not indicated -Immunizations: Flu and Prevnar given today -Tobacco use: Advised at length to quit -Exercise: encouraged regular exercising -Bronchodilator therapy: Continue Breo and Spiriva -Exacerbation prevention: Continue Breo and Spiriva for now, if recurrence then he may need Daliresp  He would be a good candidate for our IMPACT study. I discussed with him today and he is interested   Pulmonary nodule Continue lifelong anticoagulation   Updated Medication List Outpatient Encounter Prescriptions as of 03/15/2014  Medication Sig  . acetaminophen (TYLENOL) 325 MG tablet Take 650 mg by mouth every 4  (four) hours as needed. For pain  . albuterol (PROAIR HFA) 108 (90 BASE) MCG/ACT inhaler Inhale 2 puffs into the lungs as needed.  Marland Kitchen atorvastatin (LIPITOR) 40 MG tablet Take 80 mg by mouth daily.   . chlorthalidone (HYGROTON) 25 MG tablet TAKE 1 TABLET BY MOUTH DAILY  . Coenzyme Q10 (CO Q 10) 10 MG CAPS Take 200 mg by mouth daily.  Marland Kitchen esomeprazole (NEXIUM) 40 MG capsule Take 40 mg by mouth daily before breakfast.  . ferrous sulfate 325 (65 FE) MG tablet Take 325 mg by mouth daily.  . fish oil-omega-3 fatty acids 1000 MG capsule Take 2 g by mouth daily.   . fluticasone (FLONASE) 50 MCG/ACT nasal spray Place 2 sprays into the nose daily.    . Fluticasone Furoate-Vilanterol (BREO ELLIPTA) 100-25 MCG/INH AEPB Inhale 1 puff into the lungs as needed.  Marland Kitchen HYDROcodone-acetaminophen (NORCO) 7.5-325 MG per tablet 1 tablet twice daily as needed  . isosorbide mononitrate (IMDUR) 120 MG 24 hr tablet Take 180 mg by mouth daily.  Marland Kitchen losartan (COZAAR) 100 MG tablet daily.  . metFORMIN (GLUCOPHAGE) 500 MG tablet Take 500 mg by mouth 2 (two) times daily with a meal.  . metoprolol (LOPRESSOR) 50 MG tablet 1 1/2 tab twice daily  . nitroGLYCERIN (NITROSTAT) 0.4 MG SL tablet Place 1 tablet (0.4 mg total) under the tongue every 5 (five) minutes as needed. For chest pain  . tiotropium (SPIRIVA) 18 MCG inhalation capsule Place 18 mcg into inhaler and inhale daily.  . verapamil (CALAN-SR) 240 MG CR tablet Take 1 tablet (240 mg total) by mouth daily.  Marland Kitchen warfarin (COUMADIN) 5 MG tablet Take 5 mg by mouth as directed. MANAGED BY PMD  . zolpidem (AMBIEN) 5 MG tablet Take 5 mg by mouth at bedtime.   . [DISCONTINUED] chlorthalidone (HYGROTON) 25 MG tablet Take 25  mg by mouth daily.  . [DISCONTINUED] Fluticasone-Salmeterol (ADVAIR DISKUS) 250-50 MCG/DOSE AEPB Inhale 1 puff into the lungs 2 (two) times daily.    . [DISCONTINUED] Iron Combinations (IRON COMPLEX PO) Take 1 capsule by mouth 2 (two) times daily.  . [DISCONTINUED]  metoprolol (LOPRESSOR) 100 MG tablet Take 100 mg by mouth 2 (two) times daily.  . [DISCONTINUED] mometasone (NASONEX) 50 MCG/ACT nasal spray Place 2 sprays into the nose daily.

## 2014-03-16 ENCOUNTER — Other Ambulatory Visit: Payer: Self-pay | Admitting: Cardiology

## 2014-04-04 ENCOUNTER — Encounter: Payer: Self-pay | Admitting: Cardiology

## 2014-04-04 NOTE — Progress Notes (Signed)
Clinical Summary Henry [name] is a medically complex 73 y.o.male last seen in June. Recent followup with Pulmonary noted. He is here with his wife today. States she has had a lot of trouble with chest congestion and sinus drainage over the last several months. Has intermittent angina symptoms that have been stable (exertional), not requiring nitroglycerin.  He continues on Coumadin, followed by Dr. Edrick Oh. Also states he will be having followup lipid panel soon at a visit in October.  Echocardiogram from June reported moderate LVH with LVEF 02-63%, grade 1 diastolic dysfunction, mild right atrial enlargement.   Allergies  Allergen Reactions  . Ace Inhibitors     Cough    Current Outpatient Prescriptions  Medication Sig Dispense Refill  . acetaminophen (TYLENOL) 325 MG tablet Take 650 mg by mouth every 4 (four) hours as needed. For pain      . albuterol (PROAIR HFA) 108 (90 BASE) MCG/ACT inhaler Inhale 2 puffs into the lungs as needed.      Marland Kitchen atorvastatin (LIPITOR) 40 MG tablet Take 80 mg by mouth daily.       . chlorthalidone (HYGROTON) 25 MG tablet TAKE 1 TABLET BY MOUTH DAILY  30 tablet  3  . Coenzyme Q10 (CO Q 10) 10 MG CAPS Take 200 mg by mouth daily.      Marland Kitchen esomeprazole (NEXIUM) 40 MG capsule Take 40 mg by mouth daily before breakfast.      . ferrous sulfate 325 (65 FE) MG tablet Take 325 mg by mouth daily.      . fish oil-omega-3 fatty acids 1000 MG capsule Take 2 g by mouth daily.       . fluticasone (FLONASE) 50 MCG/ACT nasal spray Place 2 sprays into the nose daily.        . Fluticasone Furoate-Vilanterol (BREO ELLIPTA) 100-25 MCG/INH AEPB Inhale 1 puff into the lungs as needed.      Marland Kitchen HYDROcodone-acetaminophen (NORCO) 7.5-325 MG per tablet 1 tablet twice daily as needed      . isosorbide mononitrate (IMDUR) 120 MG 24 hr tablet Take 180 mg by mouth daily.      Marland Kitchen losartan (COZAAR) 100 MG tablet daily.      . metFORMIN (GLUCOPHAGE) 500 MG tablet Take 500 mg by mouth 2 (two)  times daily with a meal.      . metoprolol (LOPRESSOR) 50 MG tablet 1 1/2 tab twice daily      . metoprolol (LOPRESSOR) 50 MG tablet TAKE 1 AND 1/2 TABLETS BY MOUTH TWICE DAILY  90 tablet  6  . nitroGLYCERIN (NITROSTAT) 0.4 MG SL tablet Place 1 tablet (0.4 mg total) under the tongue every 5 (five) minutes x 3 doses as needed. For chest pain  25 tablet  3  . tiotropium (SPIRIVA) 18 MCG inhalation capsule Place 18 mcg into inhaler and inhale daily.      . verapamil (CALAN-SR) 240 MG CR tablet Take 1 tablet (240 mg total) by mouth daily.  30 tablet  6  . warfarin (COUMADIN) 5 MG tablet Take 5 mg by mouth as directed. MANAGED BY PMD      . zolpidem (AMBIEN) 5 MG tablet Take 5 mg by mouth at bedtime.        No current facility-administered medications for this visit.    Past Medical History  Diagnosis Date  . Coronary atherosclerosis of native coronary artery     a. NSTEMI 2005 s/p CABG (LIMA - LAD, SVG - OM, SVG - PDA).  b. DES to Cx 02/2005. c. NSTEMI 07/2010 due to occ Cx - med rx. Known occluded vein grafts.  . Essential hypertension, benign   . Type 2 diabetes mellitus   . Dyslipidemia   . ACE inhibitor intolerance   . Asthma   . Recurrent pulmonary embolism     a. 01/2011. b. Bilateral PE 11/2011 - + lupus anticoagulant preliminary testing. Felt to require lifelong Coumadin.  . Pulmonary nodule     RML by CT 11/2011, stable compared to 2012.  Marland Kitchen Cholelithiasis   . Pulmonary HTN     Echo 11/2011 at time of PE. PA pressure not assessed on 03/2012 echo but RV was back to normal.  . Lupus anticoagulant positive   . GERD (gastroesophageal reflux disease)   . COPD (chronic obstructive pulmonary disease)   . OSA (obstructive sleep apnea)     Past Surgical History  Procedure Laterality Date  . Coronary artery bypass graft  2005     LIMA to LAD, SVG to OM, SVG to RCA  . L4-l5 laminectomy    . Colonoscopy    . Esophagogastroduodenoscopy N/A 12/19/2013    Procedure: ESOPHAGOGASTRODUODENOSCOPY  (EGD);  Surgeon: Lafayette Dragon, MD;  Location: [facility] ENDOSCOPY;  Service: Endoscopy;  Laterality: N/A;  . Kidney stone removed      Social History Mr. Chain reports that he quit smoking about 41 years ago. His smoking use included Cigarettes. He has a 9 pack-year smoking history. His smokeless tobacco use includes Chew. Mr. Prater reports that he does not drink alcohol.  Review of Systems No fevers or chills. Stable appetite. No spontaneous bleeding episodes. Other systems reviewed and negative except as outlined.  Physical Examination Filed Vitals:   04/05/14 0846  BP: 157/73  Pulse: 73   Filed Weights   04/05/14 0846  Weight: 184 lb (83.462 kg)    Comfortable at rest.  HEENT: Conjunctiva and lids normal, oropharynx clear with poor dentition.  Neck: Supple, no elevated JVP or carotid bruits, no thyromegaly.  Lungs: Rhonchorous breath sounds without wheezing, decreased breath sounds, nonlabored breathing at rest.  Cardiac: Regular rate and rhythm, no S3 or significant systolic murmur, no pericardial rub.  Abdomen: Soft, nontender, bowel sounds present.  Extremities: No pitting edema, distal pulses 1-2+.  Skin: Warm and dry.  Musculoskeletal: No kyphosis.  Neuropsychiatric: Alert and oriented x3, affect grossly appropriate.   Problem List and Plan   Coronary atherosclerosis of native coronary artery Stable angina symptoms, refill provided for nitroglycerin (he had an old bottle). He has known native disease including graft disease as detailed above, continue medical therapy and observation.  Dyslipidemia Continues on atorvastatin, due for a followup lipid panel with Dr. Edrick Oh soon.  Recurrent pulmonary embolism Lupus anticoagulant positive as well. He continues on lifelong Coumadin, followed by Dr. Edrick Oh.  Essential hypertension, benign Blood pressure elevated today. No change in current regimen. Keep followup with Dr. Edrick Oh. Encouraged regular activity and his walking  regimen for exercise.    Satira Sark, M.D., F.A.C.C.

## 2014-04-05 ENCOUNTER — Ambulatory Visit (INDEPENDENT_AMBULATORY_CARE_PROVIDER_SITE_OTHER): Payer: Medicare Other | Admitting: Cardiology

## 2014-04-05 ENCOUNTER — Encounter: Payer: Self-pay | Admitting: Cardiology

## 2014-04-05 VITALS — BP 157/73 | HR 73 | Ht 68.0 in | Wt 184.0 lb

## 2014-04-05 DIAGNOSIS — I25119 Atherosclerotic heart disease of native coronary artery with unspecified angina pectoris: Secondary | ICD-10-CM

## 2014-04-05 DIAGNOSIS — I2510 Atherosclerotic heart disease of native coronary artery without angina pectoris: Secondary | ICD-10-CM

## 2014-04-05 DIAGNOSIS — E785 Hyperlipidemia, unspecified: Secondary | ICD-10-CM

## 2014-04-05 DIAGNOSIS — I2699 Other pulmonary embolism without acute cor pulmonale: Secondary | ICD-10-CM

## 2014-04-05 DIAGNOSIS — I10 Essential (primary) hypertension: Secondary | ICD-10-CM

## 2014-04-05 DIAGNOSIS — I209 Angina pectoris, unspecified: Secondary | ICD-10-CM

## 2014-04-05 MED ORDER — NITROGLYCERIN 0.4 MG SL SUBL: 0.4000 mg | SUBLINGUAL_TABLET | SUBLINGUAL | Status: AC | PRN

## 2014-04-05 NOTE — Assessment & Plan Note (Signed)
Blood pressure elevated today. No change in current regimen. Keep followup with Dr. Edrick Oh. Encouraged regular activity and his walking regimen for exercise.

## 2014-04-05 NOTE — Assessment & Plan Note (Signed)
Continues on atorvastatin, due for a followup lipid panel with Dr. Edrick Oh soon.

## 2014-04-05 NOTE — Assessment & Plan Note (Signed)
Lupus anticoagulant positive as well. He continues on lifelong Coumadin, followed by Dr. Edrick Oh.

## 2014-04-05 NOTE — Assessment & Plan Note (Signed)
Stable angina symptoms, refill provided for nitroglycerin (he had an old bottle). He has known native disease including graft disease as detailed above, continue medical therapy and observation.

## 2014-04-05 NOTE — Patient Instructions (Signed)
Your physician recommends that you schedule a follow-up appointment in: 3 months. Your physician recommends that you continue on your current medications as directed. Please refer to the Current Medication list given to you today. 

## 2014-04-12 ENCOUNTER — Other Ambulatory Visit (INDEPENDENT_AMBULATORY_CARE_PROVIDER_SITE_OTHER): Payer: Medicare Other

## 2014-04-12 ENCOUNTER — Ambulatory Visit (INDEPENDENT_AMBULATORY_CARE_PROVIDER_SITE_OTHER): Payer: Medicare Other | Admitting: Pulmonary Disease

## 2014-04-12 ENCOUNTER — Encounter: Payer: Self-pay | Admitting: Pulmonary Disease

## 2014-04-12 VITALS — BP 140/80 | HR 78 | Ht 68.0 in | Wt 183.0 lb

## 2014-04-12 DIAGNOSIS — I2699 Other pulmonary embolism without acute cor pulmonale: Secondary | ICD-10-CM

## 2014-04-12 DIAGNOSIS — J441 Chronic obstructive pulmonary disease with (acute) exacerbation: Secondary | ICD-10-CM

## 2014-04-12 DIAGNOSIS — J4489 Other specified chronic obstructive pulmonary disease: Secondary | ICD-10-CM

## 2014-04-12 DIAGNOSIS — R911 Solitary pulmonary nodule: Secondary | ICD-10-CM

## 2014-04-12 DIAGNOSIS — J449 Chronic obstructive pulmonary disease, unspecified: Secondary | ICD-10-CM

## 2014-04-12 LAB — CBC WITH DIFFERENTIAL/PLATELET
BASOS ABS: 0.1 10*3/uL (ref 0.0–0.1)
BASOS PCT: 0.5 % (ref 0.0–3.0)
EOS ABS: 1.8 10*3/uL — AB (ref 0.0–0.7)
Eosinophils Relative: 13.6 % — ABNORMAL HIGH (ref 0.0–5.0)
HEMATOCRIT: 44.8 % (ref 39.0–52.0)
HEMOGLOBIN: 14.9 g/dL (ref 13.0–17.0)
LYMPHS ABS: 3.3 10*3/uL (ref 0.7–4.0)
LYMPHS PCT: 25.1 % (ref 12.0–46.0)
MCHC: 33.2 g/dL (ref 30.0–36.0)
MCV: 87.9 fl (ref 78.0–100.0)
MONO ABS: 0.7 10*3/uL (ref 0.1–1.0)
Monocytes Relative: 5.4 % (ref 3.0–12.0)
NEUTROS ABS: 7.4 10*3/uL (ref 1.4–7.7)
Neutrophils Relative %: 55.4 % (ref 43.0–77.0)
Platelets: 312 10*3/uL (ref 150.0–400.0)
RBC: 5.09 Mil/uL (ref 4.22–5.81)
RDW: 15.2 % (ref 11.5–15.5)
WBC: 13.3 10*3/uL — ABNORMAL HIGH (ref 4.0–10.5)

## 2014-04-12 MED ORDER — PREDNISONE 10 MG PO TABS: ORAL_TABLET | ORAL | Status: DC

## 2014-04-12 MED ORDER — ROFLUMILAST 500 MCG PO TABS: 500.0000 ug | ORAL_TABLET | Freq: Every day | ORAL | Status: DC

## 2014-04-12 MED ORDER — DOXYCYCLINE HYCLATE 100 MG PO TABS: 100.0000 mg | ORAL_TABLET | Freq: Two times a day (BID) | ORAL | Status: DC

## 2014-04-12 NOTE — Assessment & Plan Note (Signed)
Ths problem was stable in 2012>2013 and there has been no worrisome findings on CXR in 2015.  No futher imaging needed

## 2014-04-12 NOTE — Patient Instructions (Signed)
Take the prednisone taper as written Take the doxycycline for 7 days with a probiotic like yogurt  Start taking roflumilast daily  We will contact our research team again about you  WE will see you back in 6 weeks or sooner if needed

## 2014-04-12 NOTE — Assessment & Plan Note (Signed)
Unfortunately it sounds like he is having another exacerbation of COPD. There does not appear to be an infectious correlate this time but he has been suffering with allergies a lot recently. We need to get a CBC with differential to look for eosinophilia and an IgE to see if there is an allergic component to this. Because of his recurrent exacerbations we are going to start Roflumilast.  Plan: -Prednisone taper and doxycycline -Start Roflumilast -Obtain serum IgE and eosinophil count -Followup 4-6 weeks  Again I mentioned to him today that he would be a good candidate for our Impact study.

## 2014-04-12 NOTE — Progress Notes (Signed)
Subjective:    Patient ID: Henry [name], male    DOB: June 25, 1941, 73 y.o.   MRN: [id number]  Synopsis: GOLD Grade C COPD 02/2014 Spirometry > airflow obstruction FEV1 65% pred   HPI  04/12/2014 ROV > For the last week or so Henry [name] has had sinus congestion draining into his chest.  He is coughing more and more short o breath. Pale to green mucus. No fever.  Occasionally scant blood. He is taking mucinex and claritin.  No nasal sprays other than his own saline spray.  He has itchy eyes and a scratchy throat.   Past Medical History  Diagnosis Date  . Coronary atherosclerosis of native coronary artery     a. NSTEMI 2005 s/p CABG (LIMA - LAD, SVG - OM, SVG - PDA). b. DES to Cx 02/2005. c. NSTEMI 07/2010 due to occ Cx - med rx. Known occluded vein grafts.  . Essential hypertension, benign   . Type 2 diabetes mellitus   . Dyslipidemia   . ACE inhibitor intolerance   . Asthma   . Recurrent pulmonary embolism     a. 01/2011. b. Bilateral PE 11/2011 - + lupus anticoagulant preliminary testing. Felt to require lifelong Coumadin.  . Pulmonary nodule     RML by CT 11/2011, stable compared to 2012.  Marland Kitchen Cholelithiasis   . Pulmonary HTN     Echo 11/2011 at time of PE. PA pressure not assessed on 03/2012 echo but RV was back to normal.  . Lupus anticoagulant positive   . GERD (gastroesophageal reflux disease)   . COPD (chronic obstructive pulmonary disease)   . OSA (obstructive sleep apnea)      Review of Systems  Constitutional: Positive for fatigue. Negative for fever and chills.  HENT: Negative for postnasal drip, rhinorrhea and sinus pressure.   Respiratory: Positive for cough, shortness of breath and wheezing.   Cardiovascular: Negative for chest pain, palpitations and leg swelling.       Objective:   Physical Exam  Filed Vitals:   04/12/14 0955  BP: 140/80  Pulse: 78  Height: 5\' 8"  (1.727 m)  Weight: 183 lb (83.008 kg)  SpO2: 94%  RA  Gen: coughing but breathing comfortably, no  acute distress HEENT: NCAT, EOMi, OP clear, PULM: Wheezing bilaterally CV: RRR, no mgr, no JVD AB: BS+, soft, nontender,  Ext: warm, no edema, no clubbing, no cyanosis Derm: no rash or skin breakdown Neuro: A&Ox4, MAEW        Assessment & Plan:   COPD exacerbation Unfortunately it sounds like he is having another exacerbation of COPD. There does not appear to be an infectious correlate this time but he has been suffering with allergies a lot recently. We need to get a CBC with differential to look for eosinophilia and an IgE to see if there is an allergic component to this. Because of his recurrent exacerbations we are going to start Roflumilast.  Plan: -Prednisone taper and doxycycline -Start Roflumilast -Obtain serum IgE and eosinophil count -Followup 4-6 weeks  Again I mentioned to him today that he would be a good candidate for our Impact study.  Pulmonary nodule Ths problem was stable in 2012>2013 and there has been no worrisome findings on CXR in 2015.  No futher imaging needed  Recurrent pulmonary embolism Continue lifelong anticoagulation    Updated Medication List Outpatient Encounter Prescriptions as of 04/12/2014  Medication Sig  . acetaminophen (TYLENOL) 325 MG tablet Take 650 mg by mouth every 4 (four) hours  as needed. For pain  . albuterol (PROAIR HFA) 108 (90 BASE) MCG/ACT inhaler Inhale 2 puffs into the lungs as needed.  Marland Kitchen atorvastatin (LIPITOR) 40 MG tablet Take 80 mg by mouth daily.   . chlorthalidone (HYGROTON) 25 MG tablet TAKE 1 TABLET BY MOUTH DAILY  . Coenzyme Q10 (CO Q 10) 10 MG CAPS Take 200 mg by mouth daily.  Marland Kitchen esomeprazole (NEXIUM) 40 MG capsule Take 40 mg by mouth daily before breakfast.  . ferrous sulfate 325 (65 FE) MG tablet Take 325 mg by mouth daily.  . fish oil-omega-3 fatty acids 1000 MG capsule Take 2 g by mouth daily.   . fluticasone (FLONASE) 50 MCG/ACT nasal spray Place 2 sprays into the nose daily.    . Fluticasone  Furoate-Vilanterol (BREO ELLIPTA) 100-25 MCG/INH AEPB Inhale 1 puff into the lungs as needed.  Marland Kitchen HYDROcodone-acetaminophen (NORCO) 7.5-325 MG per tablet 1 tablet twice daily as needed  . isosorbide mononitrate (IMDUR) 120 MG 24 hr tablet Take 180 mg by mouth daily.  Marland Kitchen losartan (COZAAR) 100 MG tablet daily.  . metFORMIN (GLUCOPHAGE) 500 MG tablet Take 500 mg by mouth 2 (two) times daily with a meal.  . metoprolol (LOPRESSOR) 50 MG tablet 1 1/2 tab twice daily  . metoprolol (LOPRESSOR) 50 MG tablet TAKE 1 AND 1/2 TABLETS BY MOUTH TWICE DAILY  . nitroGLYCERIN (NITROSTAT) 0.4 MG SL tablet Place 1 tablet (0.4 mg total) under the tongue every 5 (five) minutes x 3 doses as needed. For chest pain  . tiotropium (SPIRIVA) 18 MCG inhalation capsule Place 18 mcg into inhaler and inhale daily.  . verapamil (CALAN-SR) 240 MG CR tablet Take 1 tablet (240 mg total) by mouth daily.  Marland Kitchen warfarin (COUMADIN) 5 MG tablet Take 5 mg by mouth as directed. MANAGED BY PMD  . zolpidem (AMBIEN) 5 MG tablet Take 5 mg by mouth at bedtime.

## 2014-04-12 NOTE — Assessment & Plan Note (Signed)
Continue lifelong anticoagulation.  

## 2014-04-18 ENCOUNTER — Other Ambulatory Visit: Payer: Self-pay | Admitting: Cardiology

## 2014-05-03 ENCOUNTER — Other Ambulatory Visit: Payer: Self-pay | Admitting: Internal Medicine

## 2014-05-03 DIAGNOSIS — R0600 Dyspnea, unspecified: Secondary | ICD-10-CM

## 2014-05-04 ENCOUNTER — Encounter: Payer: Self-pay | Admitting: Adult Health

## 2014-05-04 ENCOUNTER — Ambulatory Visit (INDEPENDENT_AMBULATORY_CARE_PROVIDER_SITE_OTHER): Payer: Self-pay | Admitting: Internal Medicine

## 2014-05-04 ENCOUNTER — Ambulatory Visit (INDEPENDENT_AMBULATORY_CARE_PROVIDER_SITE_OTHER): Payer: Self-pay | Admitting: Adult Health

## 2014-05-04 VITALS — BP 116/70 | HR 70 | Temp 98.4°F | Ht 68.0 in | Wt 182.0 lb

## 2014-05-04 DIAGNOSIS — J449 Chronic obstructive pulmonary disease, unspecified: Secondary | ICD-10-CM

## 2014-05-04 DIAGNOSIS — R0600 Dyspnea, unspecified: Secondary | ICD-10-CM

## 2014-05-04 LAB — PULMONARY FUNCTION TEST
FEF 25-75 PRE: 1.97 L/s
FEF 25-75 Post: 2.37 L/sec
FEF2575-%Change-Post: 20 %
FEF2575-%PRED-POST: 115 %
FEF2575-%Pred-Pre: 95 %
FEV1-%Change-Post: 5 %
FEV1-%PRED-POST: 89 %
FEV1-%PRED-PRE: 85 %
FEV1-PRE: 2.41 L
FEV1-Post: 2.54 L
FEV1FVC-%CHANGE-POST: 1 %
FEV1FVC-%PRED-PRE: 105 %
FEV6-%Change-Post: 4 %
FEV6-%PRED-POST: 88 %
FEV6-%Pred-Pre: 84 %
FEV6-Post: 3.25 L
FEV6-Pre: 3.11 L
FEV6FVC-%Change-Post: 0 %
FEV6FVC-%Pred-Post: 106 %
FEV6FVC-%Pred-Pre: 105 %
FVC-%Change-Post: 3 %
FVC-%Pred-Post: 82 %
FVC-%Pred-Pre: 80 %
FVC-POST: 3.25 L
FVC-Pre: 3.14 L
Post FEV1/FVC ratio: 78 %
Post FEV6/FVC ratio: 100 %
Pre FEV1/FVC ratio: 77 %
Pre FEV6/FVC Ratio: 99 %

## 2014-05-04 NOTE — Progress Notes (Signed)
PFT done today. 

## 2014-05-04 NOTE — Assessment & Plan Note (Signed)
Research pt , not seen

## 2014-05-04 NOTE — Progress Notes (Signed)
IMPACT CONSENT/HIPPA:  Patient here to screen for Phase III Impact COPD study.  The consent was reviewed with the subject and his wife.  We discussed the purpose of the study, risks, possible benefits, compensation, and patient responsibilities.  All questions were answered by the study staff and the patient was given a copy of the consent after it was signed voluntarily and willingly.  All study activities were begun after the consent was signed.    Prior to the patient being seen and after further review of the exclusion criteria, it was determined the episode the patient experienced in June 2015 following a colonoscopy was stress related unstable angina and would disqualify the patient from participation within the study.  The risk does not out weigh the benefit for this patient and meets exclusion criteria # 12 at this time.  The provider did not see the patient nor perform any study related activities, she does however agree and made the final determination to the subjects participation.

## 2014-05-20 ENCOUNTER — Other Ambulatory Visit: Payer: Self-pay | Admitting: Cardiology

## 2014-05-23 ENCOUNTER — Other Ambulatory Visit: Payer: Self-pay | Admitting: Cardiology

## 2014-06-02 ENCOUNTER — Encounter: Payer: Self-pay | Admitting: Pulmonary Disease

## 2014-06-02 ENCOUNTER — Ambulatory Visit (INDEPENDENT_AMBULATORY_CARE_PROVIDER_SITE_OTHER): Payer: Medicare Other | Admitting: Pulmonary Disease

## 2014-06-02 VITALS — BP 144/72 | HR 73 | Ht 68.0 in | Wt 178.0 lb

## 2014-06-02 DIAGNOSIS — J449 Chronic obstructive pulmonary disease, unspecified: Secondary | ICD-10-CM

## 2014-06-02 NOTE — Assessment & Plan Note (Signed)
Henry [name] continues to struggle with recurrent COPD exacerbations. He is not currently smoking. What is notable is that he has a high eosinophil count which is indicative of an environmental trigger or allergen. Today we spent a lengthy amount of time trying to decide if there is water damage in his house or mold or mildew that could be contributing. We were not able to identify anything with the exception of some mild mildew in his bathroom. He has not looked in his crawl space in quite some time.  I would like to try to get to the bottom of his eosinophilia by drawing serum allergy testing as well as IgE levels and specific IgE levels today. However, he just finished prednisone yesterday so if we were to draw the labs today that would be useless.  Plan: -Draw serum allergy panel as well as Aspergillus specific IgE levels next week -Continue current inhaled therapy -Consider enrollment in clinical trial for biologic agent intended for patients with severe COPD symptoms and eosinophilia

## 2014-06-02 NOTE — Progress Notes (Signed)
   Subjective:    Patient ID: Henry [name], male    DOB: 02-10-1941, 73 y.o.   MRN: [id number]  HPI    Review of Systems     Objective:   Physical Exam        Assessment & Plan:

## 2014-06-02 NOTE — Patient Instructions (Signed)
Go get lab work next week Take Spiriva daily and Breo daily We will see you back in 3-4 weeks after the blood work is back

## 2014-06-02 NOTE — Progress Notes (Signed)
Subjective:    Patient ID: Henry [name], male    DOB: 01-16-1941, 73 y.o.   MRN: [id number]  Synopsis: GOLD Grade C COPD 02/2014 Spirometry > airflow obstruction FEV1 65% pred   HPI  Chief Complaint  Patient presents with  . Follow-up    pt c/o prod cough with congestion, chest tightness when coughing, occasional sinus congestion.     06/02/2014 ROV> [place] has been having more congestion and cough lately.  He has more wheezing and congestion again.  He was treated with prednisone and levaquin last week.  He has had sinus congestion this year that was pretty bad earlier in the year.  He has been in his crawl space recently and said that it was damp.  He states that there has been no other major changes to his house. He has not had a rash. He does have some sinus symptoms consistently. This has not been worse lately however. He continues to take the Spiriva and the [place] daily. Since finishing the prednisone yesterday he says that his shortness of breath has improved.  Past Medical History  Diagnosis Date  . Coronary atherosclerosis of native coronary artery     a. NSTEMI 2005 s/p CABG (LIMA - LAD, SVG - OM, SVG - PDA). b. DES to Cx 02/2005. c. NSTEMI 07/2010 due to occ Cx - med rx. Known occluded vein grafts.  . Essential hypertension, benign   . Type 2 diabetes mellitus   . Dyslipidemia   . ACE inhibitor intolerance   . Asthma   . Recurrent pulmonary embolism     a. 01/2011. b. Bilateral PE 11/2011 - + lupus anticoagulant preliminary testing. Felt to require lifelong Coumadin.  . Pulmonary nodule     RML by CT 11/2011, stable compared to 2012.  Marland Kitchen Cholelithiasis   . Pulmonary HTN     Echo 11/2011 at time of PE. PA pressure not assessed on 03/2012 echo but RV was back to normal.  . Lupus anticoagulant positive   . GERD (gastroesophageal reflux disease)   . COPD (chronic obstructive pulmonary disease)   . OSA (obstructive sleep apnea)      Review of Systems  Constitutional: Positive  for fatigue. Negative for fever and chills.  HENT: Negative for postnasal drip, rhinorrhea and sinus pressure.   Respiratory: Positive for cough. Negative for shortness of breath and wheezing.   Cardiovascular: Negative for chest pain, palpitations and leg swelling.       Objective:   Physical Exam   Filed Vitals:   06/02/14 0901  BP: 144/72  Pulse: 73  Height: 5\' 8"  (1.727 m)  Weight: 178 lb (80.74 kg)  SpO2: 97%  RA  Gen: coughing but breathing comfortably, no acute distress HEENT: NCAT, EOMi, OP clear, PULM: Clear to auscultation bilaterally today CV: RRR, no mgr, no JVD AB: BS+, soft, nontender,  Ext: warm, no edema, no clubbing, no cyanosis Derm: no rash or skin breakdown Neuro: A&Ox4, MAEW        Assessment & Plan:   COPD, moderate Decarlos continues to struggle with recurrent COPD exacerbations. He is not currently smoking. What is notable is that he has a high eosinophil count which is indicative of an environmental trigger or allergen. Today we spent a lengthy amount of time trying to decide if there is water damage in his house or mold or mildew that could be contributing. We were not able to identify anything with the exception of some mild mildew in his bathroom.  He has not looked in his crawl space in quite some time.  I would like to try to get to the bottom of his eosinophilia by drawing serum allergy testing as well as IgE levels and specific IgE levels today. However, he just finished prednisone yesterday so if we were to draw the labs today that would be useless.  Plan: -Draw serum allergy panel as well as Aspergillus specific IgE levels next week -Continue current inhaled therapy -Consider enrollment in clinical trial for biologic agent intended for patients with severe COPD symptoms and eosinophilia    Updated Medication List Outpatient Encounter Prescriptions as of 06/02/2014  Medication Sig  . acetaminophen (TYLENOL) 325 MG tablet Take 650 mg by  mouth every 4 (four) hours as needed. For pain  . albuterol (PROAIR HFA) 108 (90 BASE) MCG/ACT inhaler Inhale 2 puffs into the lungs as needed.  Marland Kitchen atorvastatin (LIPITOR) 40 MG tablet Take 80 mg by mouth daily.   . chlorthalidone (HYGROTON) 25 MG tablet TAKE 1 TABLET BY MOUTH DAILY  . Coenzyme Q10 (CO Q 10) 10 MG CAPS Take 200 mg by mouth daily.  Marland Kitchen esomeprazole (NEXIUM) 40 MG capsule Take 40 mg by mouth daily before breakfast.  . ferrous sulfate 325 (65 FE) MG tablet Take 325 mg by mouth daily.  . fish oil-omega-3 fatty acids 1000 MG capsule Take 2 g by mouth daily.   . fluticasone (FLONASE) 50 MCG/ACT nasal spray Place 2 sprays into the nose daily.    . Fluticasone Furoate-Vilanterol (BREO ELLIPTA) 100-25 MCG/INH AEPB Inhale 1 puff into the lungs as needed.  Marland Kitchen HYDROcodone-acetaminophen (NORCO) 7.5-325 MG per tablet 1 tablet twice daily as needed  . isosorbide mononitrate (IMDUR) 120 MG 24 hr tablet Take 180 mg by mouth daily.  . isosorbide mononitrate (IMDUR) 60 MG 24 hr tablet TAKE 3 TABLETS BY MOUTH ONCE DAILY  . levofloxacin (LEVAQUIN) 500 MG tablet Take 500 mg by mouth daily.  Marland Kitchen losartan (COZAAR) 100 MG tablet daily.  . metFORMIN (GLUCOPHAGE) 500 MG tablet Take 500 mg by mouth 2 (two) times daily with a meal.  . metoprolol (LOPRESSOR) 50 MG tablet TAKE 1 AND 1/2 TABLETS BY MOUTH TWICE DAILY  . nitroGLYCERIN (NITROSTAT) 0.4 MG SL tablet Place 1 tablet (0.4 mg total) under the tongue every 5 (five) minutes x 3 doses as needed. For chest pain  . roflumilast (DALIRESP) 500 MCG TABS tablet Take 1 tablet (500 mcg total) by mouth daily.  Marland Kitchen tiotropium (SPIRIVA) 18 MCG inhalation capsule Place 18 mcg into inhaler and inhale daily.  . verapamil (CALAN-SR) 240 MG CR tablet TAKE 1 TABLET (240 MG TOTAL) BY MOUTH DAILY.  Marland Kitchen warfarin (COUMADIN) 5 MG tablet Take 5 mg by mouth as directed. MANAGED BY PMD  . zolpidem (AMBIEN) 5 MG tablet Take 5 mg by mouth at bedtime.   . [DISCONTINUED] doxycycline  (VIBRA-TABS) 100 MG tablet Take 1 tablet (100 mg total) by mouth 2 (two) times daily.  . [DISCONTINUED] metoprolol (LOPRESSOR) 50 MG tablet 1 1/2 tab twice daily  . [DISCONTINUED] predniSONE (DELTASONE) 10 MG tablet Take 40mg  po daily for 3 days, then take 30mg  po daily for 3 days, then take 20mg  po daily for two days, then take 10mg  po daily for 2 days  . [DISCONTINUED] verapamil (CALAN-SR) 240 MG CR tablet TAKE 1 TABLET (240 MG TOTAL) BY MOUTH DAILY.

## 2014-06-07 ENCOUNTER — Telehealth: Payer: Self-pay | Admitting: Pulmonary Disease

## 2014-06-07 NOTE — Telephone Encounter (Signed)
LMTCB

## 2014-06-07 NOTE — Telephone Encounter (Signed)
yes

## 2014-06-07 NOTE — Telephone Encounter (Signed)
BQ please advise if ok for the pt to have his labs done at [facility].  thanks

## 2014-06-08 NOTE — Telephone Encounter (Signed)
Called and spoke to pt's wife. Informed spouse that pt can get labs drawn at [place] and spoke to registration at [facility] and informed them of the lab order requisition that will be faxed for a pt (fax: [phone]). Orders faxed. Nothing further needed.

## 2014-06-20 ENCOUNTER — Other Ambulatory Visit: Payer: Self-pay | Admitting: *Deleted

## 2014-06-20 MED ORDER — LOSARTAN POTASSIUM 100 MG PO TABS: 100.0000 mg | ORAL_TABLET | Freq: Every day | ORAL | Status: DC

## 2014-06-28 ENCOUNTER — Encounter: Payer: Self-pay | Admitting: Pulmonary Disease

## 2014-06-28 ENCOUNTER — Ambulatory Visit (INDEPENDENT_AMBULATORY_CARE_PROVIDER_SITE_OTHER): Payer: Medicare Other | Admitting: Pulmonary Disease

## 2014-06-28 VITALS — BP 142/84 | HR 73 | Temp 99.1°F | Ht 68.0 in

## 2014-06-28 DIAGNOSIS — J449 Chronic obstructive pulmonary disease, unspecified: Secondary | ICD-10-CM

## 2014-06-28 MED ORDER — PREDNISONE 10 MG PO TABS: ORAL_TABLET | ORAL | Status: DC

## 2014-06-28 MED ORDER — DOXYCYCLINE HYCLATE 100 MG PO TABS: 100.0000 mg | ORAL_TABLET | Freq: Two times a day (BID) | ORAL | Status: DC

## 2014-06-28 NOTE — Assessment & Plan Note (Signed)
Unfortunately Henry [name] is having another exacerbation of COPD. I explained to him today at length that it seems that he has an allergy to something. At this point we have not been able to pinpoint a specific allergy with serologic testing or history. He does not produce significant amounts of dark colored sputum or airway casts to suggest ABPA despite the elevated IgE.  I gave him 3 options today including an allergy referral, referral for a clinical trial for patients to have eosinophilia and recurrent exacerbations of COPD, and finally (my least favorite option) low dose of a lengthy course of prednisone. I explained to him that there are high risks associated with prednisone and that I would prefer to avoid this if possible. However, he states that he just wants to feel better and he doesn't want to go through more testing at this time so he requested that we try low-dose prednisone use.  Plan:  -referral for screening for the [place]  trial also he can learn more about it -Start prednisone 20 mg daily for 2 weeks followed by 10 mg daily for 7 weeks then follow-up with me at the completion of this  -doxycycline 7 days -Follow-up 2 months

## 2014-06-28 NOTE — Progress Notes (Signed)
Subjective:    Patient ID: Henry [name], male    DOB: Jul 07, 1941, 73 y.o.   MRN: [id number]  Synopsis: GOLD Grade C COPD 02/2014 Spirometry > airflow obstruction FEV1 65% pred Serum IgE from November 2015 was elevated at 128 Serum Rast testing was negative across the board   HPI Chief Complaint  Patient presents with  . Follow-up    Pt c/o prod cough with white/green mucus X1 week, sob with exertion, chest tightness with exertion, sinus congestion.     Monica Martinez has not done well since the last visit. He said that initially the prednisone the antibiotics helped quite a bit but then about 5 days prior to admission he started to develop chest congestion, shortness of breath, wheezing, and cough. He has been producing significant amounts of clear sputum. He says he also thinks he said a mild headache and a low-grade fever. He denies chest pain or leg swelling. He denies myalgias or arthralgias. No GI upset or nausea or vomiting.  Past Medical History  Diagnosis Date  . Coronary atherosclerosis of native coronary artery     a. NSTEMI 2005 s/p CABG (LIMA - LAD, SVG - OM, SVG - PDA). b. DES to Cx 02/2005. c. NSTEMI 07/2010 due to occ Cx - med rx. Known occluded vein grafts.  . Essential hypertension, benign   . Type 2 diabetes mellitus   . Dyslipidemia   . ACE inhibitor intolerance   . Asthma   . Recurrent pulmonary embolism     a. 01/2011. b. Bilateral PE 11/2011 - + lupus anticoagulant preliminary testing. Felt to require lifelong Coumadin.  . Pulmonary nodule     RML by CT 11/2011, stable compared to 2012.  Marland Kitchen Cholelithiasis   . Pulmonary HTN     Echo 11/2011 at time of PE. PA pressure not assessed on 03/2012 echo but RV was back to normal.  . Lupus anticoagulant positive   . GERD (gastroesophageal reflux disease)   . COPD (chronic obstructive pulmonary disease)   . OSA (obstructive sleep apnea)      Review of Systems  Constitutional: Positive for fatigue. Negative for fever and chills.    HENT: Negative for postnasal drip, rhinorrhea and sinus pressure.   Respiratory: Positive for cough and shortness of breath. Negative for wheezing.   Cardiovascular: Negative for chest pain, palpitations and leg swelling.       Objective:   Physical Exam  Filed Vitals:   06/28/14 1007  BP: 142/84  Pulse: 73  Temp: 99.1 F (37.3 C)  TempSrc: Oral  Height: 5\' 8"  (1.727 m)  SpO2: 95%  RA  Gen: no acute distress HEENT: NCAT, EOMi, OP clear, PULM: Wheezing bilaterally today CV: RRR, no mgr, no JVD AB: BS+, soft, nontender,  Ext: warm, no edema, no clubbing, no cyanosis Derm: no rash or skin breakdown Neuro: A&Ox4, MAEW  Serum IgE from November 2015 was elevated at 128 Serum Rast testing was negative across the board     Assessment & Plan:   COPD, moderate Unfortunately Random is having another exacerbation of COPD. I explained to him today at length that it seems that he has an allergy to something. At this point we have not been able to pinpoint a specific allergy with serologic testing or history. He does not produce significant amounts of dark colored sputum or airway casts to suggest ABPA despite the elevated IgE.  I gave him 3 options today including an allergy referral, referral for a clinical trial  for patients to have eosinophilia and recurrent exacerbations of COPD, and finally (my least favorite option) low dose of a lengthy course of prednisone. I explained to him that there are high risks associated with prednisone and that I would prefer to avoid this if possible. However, he states that he just wants to feel better and he doesn't want to go through more testing at this time so he requested that we try low-dose prednisone use.  Plan:  -referral for screening for the [place]  trial also he can learn more about it -Start prednisone 20 mg daily for 2 weeks followed by 10 mg daily for 7 weeks then follow-up with me at the completion of this  -doxycycline 7  days -Follow-up 2 months    Updated Medication List Outpatient Encounter Prescriptions as of 06/28/2014  Medication Sig  . acetaminophen (TYLENOL) 325 MG tablet Take 650 mg by mouth every 4 (four) hours as needed. For pain  . albuterol (PROAIR HFA) 108 (90 BASE) MCG/ACT inhaler Inhale 2 puffs into the lungs as needed.  Marland Kitchen atorvastatin (LIPITOR) 40 MG tablet Take 80 mg by mouth daily.   . chlorthalidone (HYGROTON) 25 MG tablet TAKE 1 TABLET BY MOUTH DAILY  . Coenzyme Q10 (CO Q 10) 10 MG CAPS Take 200 mg by mouth daily.  Marland Kitchen esomeprazole (NEXIUM) 40 MG capsule Take 40 mg by mouth daily before breakfast.  . ferrous sulfate 325 (65 FE) MG tablet Take 325 mg by mouth daily.  . fish oil-omega-3 fatty acids 1000 MG capsule Take 2 g by mouth daily.   . fluticasone (FLONASE) 50 MCG/ACT nasal spray Place 2 sprays into the nose daily.    . Fluticasone Furoate-Vilanterol (BREO ELLIPTA) 100-25 MCG/INH AEPB Inhale 1 puff into the lungs as needed.  Marland Kitchen HYDROcodone-acetaminophen (NORCO) 7.5-325 MG per tablet 1 tablet twice daily as needed  . isosorbide mononitrate (IMDUR) 120 MG 24 hr tablet Take 180 mg by mouth daily.  . isosorbide mononitrate (IMDUR) 60 MG 24 hr tablet TAKE 3 TABLETS BY MOUTH ONCE DAILY  . losartan (COZAAR) 100 MG tablet Take 1 tablet (100 mg total) by mouth daily.  . metFORMIN (GLUCOPHAGE) 500 MG tablet Take 500 mg by mouth 2 (two) times daily with a meal.  . metoprolol (LOPRESSOR) 50 MG tablet TAKE 1 AND 1/2 TABLETS BY MOUTH TWICE DAILY  . nitroGLYCERIN (NITROSTAT) 0.4 MG SL tablet Place 1 tablet (0.4 mg total) under the tongue every 5 (five) minutes x 3 doses as needed. For chest pain  . roflumilast (DALIRESP) 500 MCG TABS tablet Take 1 tablet (500 mcg total) by mouth daily.  Marland Kitchen tiotropium (SPIRIVA) 18 MCG inhalation capsule Place 18 mcg into inhaler and inhale daily.  . verapamil (CALAN-SR) 240 MG CR tablet TAKE 1 TABLET (240 MG TOTAL) BY MOUTH DAILY.  Marland Kitchen warfarin (COUMADIN) 5 MG tablet  Take 5 mg by mouth as directed. MANAGED BY PMD  . zolpidem (AMBIEN) 5 MG tablet Take 5 mg by mouth at bedtime.   Marland Kitchen doxycycline (VIBRA-TABS) 100 MG tablet Take 1 tablet (100 mg total) by mouth 2 (two) times daily.  . predniSONE (DELTASONE) 10 MG tablet Take 20mg  daily for 2 weeks, then 10mg  daily for 7 weeks  . [DISCONTINUED] levofloxacin (LEVAQUIN) 500 MG tablet Take 500 mg by mouth daily.

## 2014-06-28 NOTE — Patient Instructions (Signed)
Take the prednisone 20mg  daily for two weeks then 10mg  daily for 7 more weeks Take the doxycycline as written We will see you back in 8 weeks or sooner if needed

## 2014-06-30 ENCOUNTER — Ambulatory Visit: Payer: Medicare Other | Admitting: Internal Medicine

## 2014-07-05 ENCOUNTER — Encounter: Payer: Self-pay | Admitting: Cardiology

## 2014-07-05 ENCOUNTER — Ambulatory Visit (INDEPENDENT_AMBULATORY_CARE_PROVIDER_SITE_OTHER): Payer: Medicare Other | Admitting: Cardiology

## 2014-07-05 VITALS — BP 155/78 | HR 75 | Ht 68.0 in | Wt 179.0 lb

## 2014-07-05 DIAGNOSIS — E785 Hyperlipidemia, unspecified: Secondary | ICD-10-CM

## 2014-07-05 DIAGNOSIS — R760 Raised antibody titer: Secondary | ICD-10-CM

## 2014-07-05 DIAGNOSIS — I2510 Atherosclerotic heart disease of native coronary artery without angina pectoris: Secondary | ICD-10-CM

## 2014-07-05 DIAGNOSIS — R790 Abnormal level of blood mineral: Secondary | ICD-10-CM

## 2014-07-05 NOTE — Progress Notes (Signed)
Reason for visit: CAD, hyperlipidemia  Clinical Summary Henry [name] is a medically complex 73 y.o.male last seen in September. He continues to follow closely with Pulmonary for management of COPD. He is not having any angina symptoms, but remains chronically short of breath, intermittent coughing. No reported fevers or chills.  Recent lab work in October showed total cholesterol 163, triglycerides 305, HDL 41, and LDL 61. He continues on Lipitor without obvious intolerances.  Echocardiogram from June reported moderate LVH with LVEF 62-37%, grade 1 diastolic dysfunction, mild right atrial enlargement.  He continues on chronic anticoagulation with history of positive lupus anticoagulant and recurrent pulmonary emboli. His is followed by his primary care provider. INR reported recently at 2.3. He denies any major bleeding episodes. Does need to be followed closely since he has other medications for other conditions that interact with Coumadin.  Allergies  Allergen Reactions  . Ace Inhibitors     Cough    Current Outpatient Prescriptions  Medication Sig Dispense Refill  . acetaminophen (TYLENOL) 325 MG tablet Take 650 mg by mouth every 4 (four) hours as needed. For pain    . albuterol (PROAIR HFA) 108 (90 BASE) MCG/ACT inhaler Inhale 2 puffs into the lungs as needed.    Marland Kitchen atorvastatin (LIPITOR) 40 MG tablet Take 80 mg by mouth daily.     . chlorthalidone (HYGROTON) 25 MG tablet TAKE 1 TABLET BY MOUTH DAILY 30 tablet 3  . Coenzyme Q10 (CO Q 10) 10 MG CAPS Take 200 mg by mouth daily.    Marland Kitchen doxycycline (VIBRA-TABS) 100 MG tablet Take 1 tablet (100 mg total) by mouth 2 (two) times daily. 14 tablet 0  . esomeprazole (NEXIUM) 40 MG capsule Take 40 mg by mouth daily before breakfast.    . ferrous sulfate 325 (65 FE) MG tablet Take 325 mg by mouth daily.    . fish oil-omega-3 fatty acids 1000 MG capsule Take 2 g by mouth daily.     . fluticasone (FLONASE) 50 MCG/ACT nasal spray Place 2 sprays into  the nose daily.      . Fluticasone Furoate-Vilanterol (BREO ELLIPTA) 100-25 MCG/INH AEPB Inhale 1 puff into the lungs as needed.    Marland Kitchen HYDROcodone-acetaminophen (NORCO) 7.5-325 MG per tablet 1 tablet twice daily as needed    . isosorbide mononitrate (IMDUR) 120 MG 24 hr tablet Take 180 mg by mouth daily.    . isosorbide mononitrate (IMDUR) 60 MG 24 hr tablet TAKE 3 TABLETS BY MOUTH ONCE DAILY 90 tablet 3  . losartan (COZAAR) 100 MG tablet Take 1 tablet (100 mg total) by mouth daily. 30 tablet 6  . metFORMIN (GLUCOPHAGE) 500 MG tablet Take 500 mg by mouth 2 (two) times daily with a meal.    . metoprolol (LOPRESSOR) 50 MG tablet TAKE 1 AND 1/2 TABLETS BY MOUTH TWICE DAILY 90 tablet 6  . nitroGLYCERIN (NITROSTAT) 0.4 MG SL tablet Place 1 tablet (0.4 mg total) under the tongue every 5 (five) minutes x 3 doses as needed. For chest pain 25 tablet 3  . predniSONE (DELTASONE) 10 MG tablet Take 20mg  daily for 2 weeks, then 10mg  daily for 7 weeks 70 tablet 0  . roflumilast (DALIRESP) 500 MCG TABS tablet Take 1 tablet (500 mcg total) by mouth daily. 30 tablet 11  . tiotropium (SPIRIVA) 18 MCG inhalation capsule Place 18 mcg into inhaler and inhale daily.    . verapamil (CALAN-SR) 240 MG CR tablet TAKE 1 TABLET (240 MG TOTAL) BY MOUTH DAILY. [place]  tablet 6  . warfarin (COUMADIN) 5 MG tablet Take 5 mg by mouth as directed. MANAGED BY PMD    . zolpidem (AMBIEN) 5 MG tablet Take 5 mg by mouth at bedtime.      No current facility-administered medications for this visit.    Past Medical History  Diagnosis Date  . Coronary atherosclerosis of native coronary artery     a. NSTEMI 2005 s/p CABG (LIMA - LAD, SVG - OM, SVG - PDA). b. DES to Cx 02/2005. c. NSTEMI 07/2010 due to occ Cx - med rx. Known occluded vein grafts.  . Essential hypertension, benign   . Type 2 diabetes mellitus   . Dyslipidemia   . ACE inhibitor intolerance   . Asthma   . Recurrent pulmonary embolism     a. 01/2011. b. Bilateral PE 11/2011 - +  lupus anticoagulant preliminary testing. Felt to require lifelong Coumadin.  . Pulmonary nodule     RML by CT 11/2011, stable compared to 2012.  Marland Kitchen Cholelithiasis   . Pulmonary HTN     Echo 11/2011 at time of PE. PA pressure not assessed on 03/2012 echo but RV was back to normal.  . Lupus anticoagulant positive   . GERD (gastroesophageal reflux disease)   . COPD (chronic obstructive pulmonary disease)   . OSA (obstructive sleep apnea)     Past Surgical History  Procedure Laterality Date  . Coronary artery bypass graft  2005     LIMA to LAD, SVG to OM, SVG to RCA  . L4-l5 laminectomy    . Colonoscopy    . Esophagogastroduodenoscopy N/A 12/19/2013    Procedure: ESOPHAGOGASTRODUODENOSCOPY (EGD);  Surgeon: Lafayette Dragon, MD;  Location: [facility] ENDOSCOPY;  Service: Endoscopy;  Laterality: N/A;  . Kidney stone removed      Social History Henry [name] reports that he quit smoking about 42 years ago. His smoking use included Cigarettes. He started smoking about 68 years ago. He has a 9 pack-year smoking history. His smokeless tobacco use includes Chew. Henry [name] reports that he does not drink alcohol.  Review of Systems Complete review of systems negative except as otherwise outlined in the clinical summary and also the following. No palpitations or syncope. Stable appetite.  Physical Examination Filed Vitals:   07/05/14 0942  BP: 155/78  Pulse: 75   Filed Weights   07/05/14 0942  Weight: 179 lb (81.194 kg)    Comfortable at rest.  HEENT: Conjunctiva and lids normal, oropharynx clear with poor dentition.  Neck: Supple, no elevated JVP or carotid bruits, no thyromegaly.  Lungs: Clear to auscultation, decreased breath sounds, nonlabored breathing at rest.  Cardiac: Regular rate and rhythm, no S3 or significant systolic murmur, no pericardial rub.  Abdomen: Soft, nontender, bowel sounds present.  Extremities: No pitting edema, distal pulses 1-2+.  Skin: Warm and dry.   Musculoskeletal: No kyphosis.  Neuropsychiatric: Alert and oriented x3, affect grossly appropriate.   Problem List and Plan   Coronary atherosclerosis of native coronary artery Symptomatically stable on medical therapy. No changes were made today. I encouraged regular activities as tolerated.  Lupus anticoagulant positive History of recurrent pulmonary emboli as well. He is on long-term Coumadin which is followed by his primary care provider.  Dyslipidemia LDL well controlled at 61, he continues on Lipitor.    Satira Sark, M.D., F.A.C.C.

## 2014-07-05 NOTE — Patient Instructions (Signed)
Your physician recommends that you schedule a follow-up appointment in: 3 months. Your physician recommends that you continue on your current medications as directed. Please refer to the Current Medication list given to you today. 

## 2014-07-05 NOTE — Assessment & Plan Note (Signed)
Symptomatically stable on medical therapy. No changes were made today. I encouraged regular activities as tolerated.

## 2014-07-05 NOTE — Assessment & Plan Note (Signed)
History of recurrent pulmonary emboli as well. He is on long-term Coumadin which is followed by his primary care provider.

## 2014-07-05 NOTE — Assessment & Plan Note (Signed)
LDL well controlled at 61, he continues on Lipitor.

## 2014-07-11 ENCOUNTER — Other Ambulatory Visit: Payer: Self-pay | Admitting: Cardiology

## 2014-08-12 ENCOUNTER — Other Ambulatory Visit: Payer: Self-pay | Admitting: Cardiology

## 2014-08-15 ENCOUNTER — Other Ambulatory Visit: Payer: Self-pay | Admitting: Cardiology

## 2014-08-23 ENCOUNTER — Ambulatory Visit (INDEPENDENT_AMBULATORY_CARE_PROVIDER_SITE_OTHER): Payer: Medicare Other | Admitting: Pulmonary Disease

## 2014-08-23 ENCOUNTER — Encounter: Payer: Self-pay | Admitting: Pulmonary Disease

## 2014-08-23 VITALS — BP 142/72 | HR 75 | Ht 68.0 in | Wt 182.0 lb

## 2014-08-23 DIAGNOSIS — J42 Unspecified chronic bronchitis: Secondary | ICD-10-CM

## 2014-08-23 DIAGNOSIS — J449 Chronic obstructive pulmonary disease, unspecified: Secondary | ICD-10-CM

## 2014-08-23 MED ORDER — DOXYCYCLINE HYCLATE 50 MG PO CAPS: 100.0000 mg | ORAL_CAPSULE | Freq: Every day | ORAL | Status: DC

## 2014-08-23 MED ORDER — PREDNISONE 5 MG PO TABS: ORAL_TABLET | ORAL | Status: AC

## 2014-08-23 NOTE — Progress Notes (Signed)
Subjective:    Patient ID: Henry [name], male    DOB: 09/24/1940, 74 y.o.   MRN: [id number]  Synopsis: GOLD Grade C COPD 02/2014 Spirometry > airflow obstruction FEV1 65% pred Serum IgE from November 2015 was elevated at 128 Serum Rast testing was negative across the board   HPI Chief Complaint  Patient presents with  . Follow-up    pt states his breathing is "the best it's been in a long time", has some sob with exertion.     Henry [name] says that his breathing has been great lately.  He says that his blod sugar has been up on the prednisone which he completed two weeks ago.  He has minimal cough, no fever, chills.  He is feeling much better.  Past Medical History  Diagnosis Date  . Coronary atherosclerosis of native coronary artery     a. NSTEMI 2005 s/p CABG (LIMA - LAD, SVG - OM, SVG - PDA). b. DES to Cx 02/2005. c. NSTEMI 07/2010 due to occ Cx - med rx. Known occluded vein grafts.  . Essential hypertension, benign   . Type 2 diabetes mellitus   . Dyslipidemia   . ACE inhibitor intolerance   . Asthma   . Recurrent pulmonary embolism     a. 01/2011. b. Bilateral PE 11/2011 - + lupus anticoagulant preliminary testing. Felt to require lifelong Coumadin.  . Pulmonary nodule     RML by CT 11/2011, stable compared to 2012.  Marland Kitchen Cholelithiasis   . Pulmonary HTN     Echo 11/2011 at time of PE. PA pressure not assessed on 03/2012 echo but RV was back to normal.  . Lupus anticoagulant positive   . GERD (gastroesophageal reflux disease)   . COPD (chronic obstructive pulmonary disease)   . OSA (obstructive sleep apnea)      Review of Systems  Constitutional: Negative for fever, chills and fatigue.  HENT: Negative for postnasal drip, rhinorrhea and sinus pressure.   Respiratory: Negative for cough, shortness of breath and wheezing.   Cardiovascular: Negative for chest pain, palpitations and leg swelling.       Objective:   Physical Exam  Filed Vitals:   08/23/14 1329  BP: 142/72    Pulse: 75  Height: 5\' 8"  (1.727 m)  Weight: 182 lb (82.555 kg)  SpO2: 97%  RA  Gen: no acute distress HEENT: NCAT, EOMi, OP clear, PULM: CTA B CV: RRR, no mgr, no JVD AB: BS+, soft, nontender,  Ext: warm, no edema, no clubbing, no cyanosis Derm: no rash or skin breakdown Neuro: A&Ox4, MAEW  Serum IgE from November 2015 was elevated at 128 Serum Rast testing was negative across the board     Assessment & Plan:   COPD, moderate Egon looks better today than I have seen him in months. However, he has a very severe phenotype of COPD and that he has recurrent exacerbations with peripheral eosinophilia and a moderately elevated IgE level. I have offered allergy testing and he is willing, but he wants to do it about 2 months from now.  Today we talked about various options for further workup.  Plan: -Slow prednisone taper over the course of 6 weeks -2 weeks after completing prednisone taper have allergy testing, the big question is what is causing his peripheral eosinophilia and elevated IgE and would he be a Xolair candidate -Start monthly doxycycline for 1 week with probiotic and sunscreen to prevent exacerbations of COPD -Continue inhaled regimen next line-continue Roflumilast next line-follow-up  3 months     Updated Medication List Outpatient Encounter Prescriptions as of 08/23/2014  Medication Sig  . acetaminophen (TYLENOL) 325 MG tablet Take 650 mg by mouth every 4 (four) hours as needed. For pain  . albuterol (PROAIR HFA) 108 (90 BASE) MCG/ACT inhaler Inhale 2 puffs into the lungs as needed.  Marland Kitchen atorvastatin (LIPITOR) 40 MG tablet Take 80 mg by mouth daily.   . chlorthalidone (HYGROTON) 25 MG tablet TAKE 1 TABLET BY MOUTH DAILY  . Coenzyme Q10 (CO Q 10) 10 MG CAPS Take 200 mg by mouth daily.  Marland Kitchen esomeprazole (NEXIUM) 40 MG capsule Take 40 mg by mouth daily before breakfast.  . ferrous sulfate 325 (65 FE) MG tablet Take 325 mg by mouth daily.  . fish oil-omega-3 fatty acids  1000 MG capsule Take 2 g by mouth daily.   . fluticasone (FLONASE) 50 MCG/ACT nasal spray Place 2 sprays into the nose daily.    . Fluticasone Furoate-Vilanterol (BREO ELLIPTA) 100-25 MCG/INH AEPB Inhale 1 puff into the lungs as needed.  Marland Kitchen HYDROcodone-acetaminophen (NORCO) 7.5-325 MG per tablet 1 tablet twice daily as needed  . isosorbide mononitrate (IMDUR) 60 MG 24 hr tablet TAKE 3 TABLETS BY MOUTH ONCE DAILY  . losartan (COZAAR) 100 MG tablet Take 1 tablet (100 mg total) by mouth daily.  . metFORMIN (GLUCOPHAGE) 500 MG tablet Take 500 mg by mouth 2 (two) times daily with a meal.  . metoprolol (LOPRESSOR) 50 MG tablet TAKE 1 AND 1/2 TABLETS BY MOUTH TWICE DAILY  . nitroGLYCERIN (NITROSTAT) 0.4 MG SL tablet Place 1 tablet (0.4 mg total) under the tongue every 5 (five) minutes x 3 doses as needed. For chest pain  . roflumilast (DALIRESP) 500 MCG TABS tablet Take 1 tablet (500 mcg total) by mouth daily.  Marland Kitchen tiotropium (SPIRIVA) 18 MCG inhalation capsule Place 18 mcg into inhaler and inhale daily.  . verapamil (CALAN-SR) 240 MG CR tablet TAKE 1 TABLET (240 MG TOTAL) BY MOUTH DAILY.  Marland Kitchen warfarin (COUMADIN) 5 MG tablet Take 5 mg by mouth as directed. MANAGED BY PMD  . zolpidem (AMBIEN) 5 MG tablet Take 5 mg by mouth at bedtime.   . [DISCONTINUED] predniSONE (DELTASONE) 10 MG tablet Take 20mg  daily for 2 weeks, then 10mg  daily for 7 weeks  . doxycycline (VIBRAMYCIN) 50 MG capsule Take 2 capsules (100 mg total) by mouth daily.  . predniSONE (DELTASONE) 5 MG tablet Take 10mg  daily for three weeks, then 5mg  daily for three weeks  . [DISCONTINUED] doxycycline (VIBRA-TABS) 100 MG tablet Take 1 tablet (100 mg total) by mouth 2 (two) times daily. (Patient not taking: Reported on 08/23/2014)  . [DISCONTINUED] isosorbide mononitrate (IMDUR) 120 MG 24 hr tablet Take 180 mg by mouth daily.  . [DISCONTINUED] isosorbide mononitrate (IMDUR) 60 MG 24 hr tablet TAKE 3 TABLETS BY MOUTH ONCE DAILY (Patient not taking:  Reported on 08/23/2014)

## 2014-08-23 NOTE — Patient Instructions (Signed)
Take prednisone 10mg  daily for three weeks, then 5mg  daily for three weeks Take doxycycline 100mg  daily for 7 days the first week of every month, starting March 1. Make sure you use sunscreen and a probiotic with this. Keep taking your medicine as you are doing We will refer you to Allergy We will see you back in 3 months

## 2014-08-23 NOTE — Assessment & Plan Note (Signed)
Henry [name] looks better today than I have seen him in months. However, he has a very severe phenotype of COPD and that he has recurrent exacerbations with peripheral eosinophilia and a moderately elevated IgE level. I have offered allergy testing and he is willing, but he wants to do it about 2 months from now.  Today we talked about various options for further workup.  Plan: -Slow prednisone taper over the course of 6 weeks -2 weeks after completing prednisone taper have allergy testing, the big question is what is causing his peripheral eosinophilia and elevated IgE and would he be a Xolair candidate -Start monthly doxycycline for 1 week with probiotic and sunscreen to prevent exacerbations of COPD -Continue inhaled regimen next line-continue Roflumilast next line-follow-up 3 months

## 2014-10-05 ENCOUNTER — Ambulatory Visit: Payer: Medicare Other | Admitting: Cardiology

## 2014-10-13 ENCOUNTER — Institutional Professional Consult (permissible substitution): Payer: Medicare Other | Admitting: Internal Medicine

## 2014-10-17 ENCOUNTER — Other Ambulatory Visit: Payer: Self-pay | Admitting: Cardiology

## 2014-10-28 ENCOUNTER — Encounter: Payer: Self-pay | Admitting: Pulmonary Disease

## 2014-10-31 ENCOUNTER — Telehealth: Payer: Self-pay | Admitting: Pulmonary Disease

## 2014-10-31 MED ORDER — PREDNISONE 10 MG PO TABS
ORAL_TABLET | ORAL | Status: DC
Start: 2014-10-31 — End: 2014-11-16

## 2014-10-31 NOTE — Telephone Encounter (Signed)
Rx has been sent in per BQ. Pt is aware. Nothing further was needed.

## 2014-10-31 NOTE — Telephone Encounter (Signed)
Spoke with pt. States that he feels he needs prednisone sent in. Reports chest congestion, cough with production of white mucus and SOB. Denies fever, chest tightness or wheezing. Onset was 2 weeks ago.  BQ - please advise. Thanks.

## 2014-10-31 NOTE — Telephone Encounter (Signed)
OK, prednisone: Take 40mg  po daily for 3 days, then take 30mg  po daily for 3 days, then take 20mg  po daily for two days, then take 10mg  po daily for 2 days

## 2014-11-01 ENCOUNTER — Ambulatory Visit (INDEPENDENT_AMBULATORY_CARE_PROVIDER_SITE_OTHER): Payer: Medicare Other | Admitting: Cardiology

## 2014-11-01 ENCOUNTER — Encounter: Payer: Self-pay | Admitting: Cardiology

## 2014-11-01 VITALS — BP 158/88 | HR 99 | Ht 68.0 in | Wt 180.4 lb

## 2014-11-01 DIAGNOSIS — I2510 Atherosclerotic heart disease of native coronary artery without angina pectoris: Secondary | ICD-10-CM | POA: Diagnosis not present

## 2014-11-01 DIAGNOSIS — R760 Raised antibody titer: Secondary | ICD-10-CM

## 2014-11-01 DIAGNOSIS — R790 Abnormal level of blood mineral: Secondary | ICD-10-CM | POA: Diagnosis not present

## 2014-11-01 DIAGNOSIS — E785 Hyperlipidemia, unspecified: Secondary | ICD-10-CM | POA: Diagnosis not present

## 2014-11-01 NOTE — Progress Notes (Signed)
Cardiology Office Note  Date: 11/01/2014   ID: Henry, [name] 28-Apr-1941, MRN [id number]  PCP: Sherrie Mustache, MD  Primary Cardiologist: Rozann Lesches, MD   Chief Complaint  Patient presents with  . Coronary Artery Disease  . Hypertension  . Hypercoagulable state    History of Present Illness: Henry [name] is a medically complex 74 y.o. male last seen in December 2015. He is here with his wife today for a routine visit. He reports having recent trouble with the pollen season, coughing and having sinus drainage as well as chest congestion. He was recently placed on prednisone and antibiotics by his primary care provider, also states he will be undergoing allergy testing.  He does not endorse any angina symptoms. He reports compliance with his Coumadin which is followed by Dr. Edrick Oh. ECGs reviewed below.  He reports compliance with his medications.   Past Medical History  Diagnosis Date  . Coronary atherosclerosis of native coronary artery     a. NSTEMI 2005 s/p CABG (LIMA - LAD, SVG - OM, SVG - PDA). b. DES to Cx 02/2005. c. NSTEMI 07/2010 due to occ Cx - med rx. Known occluded vein grafts.  . Essential hypertension, benign   . Type 2 diabetes mellitus   . Dyslipidemia   . ACE inhibitor intolerance   . Asthma   . Recurrent pulmonary embolism     a. 01/2011. b. Bilateral PE 11/2011 - + lupus anticoagulant preliminary testing. Felt to require lifelong Coumadin.  . Pulmonary nodule     RML by CT 11/2011, stable compared to 2012.  Marland Kitchen Cholelithiasis   . Pulmonary HTN     Echo 11/2011 at time of PE. PA pressure not assessed on 03/2012 echo but RV was back to normal.  . Lupus anticoagulant positive   . GERD (gastroesophageal reflux disease)   . COPD (chronic obstructive pulmonary disease)   . OSA (obstructive sleep apnea)     Past Surgical History  Procedure Laterality Date  . Coronary artery bypass graft  2005     LIMA to LAD, SVG to OM, SVG to RCA  . L4-l5  laminectomy    . Colonoscopy    . Esophagogastroduodenoscopy N/A 12/19/2013    Procedure: ESOPHAGOGASTRODUODENOSCOPY (EGD);  Surgeon: Lafayette Dragon, MD;  Location: [facility] ENDOSCOPY;  Service: Endoscopy;  Laterality: N/A;  . Kidney stone removed      Current Outpatient Prescriptions  Medication Sig Dispense Refill  . acetaminophen (TYLENOL) 325 MG tablet Take 650 mg by mouth every 4 (four) hours as needed. For pain    . albuterol (PROAIR HFA) 108 (90 BASE) MCG/ACT inhaler Inhale 2 puffs into the lungs as needed.    Marland Kitchen atorvastatin (LIPITOR) 40 MG tablet Take 80 mg by mouth daily.     . chlorthalidone (HYGROTON) 25 MG tablet TAKE 1 TABLET BY MOUTH DAILY 30 tablet 3  . Coenzyme Q10 (CO Q 10) 10 MG CAPS Take 200 mg by mouth daily.    Marland Kitchen esomeprazole (NEXIUM) 40 MG capsule Take 40 mg by mouth daily before breakfast.    . ferrous sulfate 325 (65 FE) MG tablet Take 325 mg by mouth daily.    . fish oil-omega-3 fatty acids 1000 MG capsule Take 2 g by mouth daily.     . fluticasone (FLONASE) 50 MCG/ACT nasal spray Place 2 sprays into the nose daily.      . Fluticasone Furoate-Vilanterol (BREO ELLIPTA) 100-25 MCG/INH AEPB Inhale 1 puff into the lungs as  needed.    Marland Kitchen HYDROcodone-acetaminophen (NORCO) 7.5-325 MG per tablet 1 tablet twice daily as needed    . isosorbide mononitrate (IMDUR) 60 MG 24 hr tablet TAKE 3 TABLETS BY MOUTH ONCE DAILY 90 tablet 3  . losartan (COZAAR) 100 MG tablet Take 1 tablet (100 mg total) by mouth daily. 30 tablet 6  . metFORMIN (GLUCOPHAGE) 500 MG tablet Take 1,000 mg by mouth 2 (two) times daily with a meal.     . metoprolol (LOPRESSOR) 50 MG tablet TAKE 1 AND 1/2 TABLETS BY MOUTH TWICE DAILY 90 tablet 6  . nitroGLYCERIN (NITROSTAT) 0.4 MG SL tablet Place 1 tablet (0.4 mg total) under the tongue every 5 (five) minutes x 3 doses as needed. For chest pain 25 tablet 3  . predniSONE (DELTASONE) 10 MG tablet Take 40mg  for 3 days, 30mg  for 3 days, 20mg  for two days, 10mg  po daily for 2  days 30 tablet 0  . roflumilast (DALIRESP) 500 MCG TABS tablet Take 1 tablet (500 mcg total) by mouth daily. 30 tablet 11  . tiotropium (SPIRIVA) 18 MCG inhalation capsule Place 18 mcg into inhaler and inhale daily.    . verapamil (CALAN-SR) 240 MG CR tablet TAKE 1 TABLET (240 MG TOTAL) BY MOUTH DAILY. 30 tablet 6  . warfarin (COUMADIN) 5 MG tablet Take 5 mg by mouth as directed. MANAGED BY PMD    . zolpidem (AMBIEN) 5 MG tablet Take 5 mg by mouth at bedtime.     Marland Kitchen doxycycline (VIBRAMYCIN) 50 MG capsule Take 2 capsules (100 mg total) by mouth daily. (Patient not taking: Reported on 11/01/2014) 7 capsule 3   No current facility-administered medications for this visit.    Allergies:  Ace inhibitors   Social History: The patient  reports that he quit smoking about 42 years ago. His smoking use included Cigarettes. He started smoking about 69 years ago. He has a 9 pack-year smoking history. His smokeless tobacco use includes Chew. He reports that he does not drink alcohol or use illicit drugs.   ROS:  Please see the history of present illness. Otherwise, complete review of systems is positive for cough and chest congestion as well as sinus drainage as outlined..  All other systems are reviewed and negative.   Physical Exam: VS:  BP 158/88 mmHg  Pulse 99  Ht 5\' 8"  (1.727 m)  Wt 180 lb 6.4 oz (81.829 kg)  BMI 27.44 kg/m2  SpO2 94%, BMI Body mass index is 27.44 kg/(m^2).  Wt Readings from Last 3 Encounters:  11/01/14 180 lb 6.4 oz (81.829 kg)  08/23/14 182 lb (82.555 kg)  07/05/14 179 lb (81.194 kg)     Comfortable at rest.  HEENT: Conjunctiva and lids normal, oropharynx clear with poor dentition.  Neck: Supple, no elevated JVP or carotid bruits, no thyromegaly.  Lungs: Decreased breath sounds, mildly prolonged expiration phase, nonlabored breathing at rest.  Cardiac: Regular rate and rhythm, no S3 or significant systolic murmur, no pericardial rub.  Abdomen: Soft, nontender, bowel  sounds present.  Extremities: No pitting edema, distal pulses 1-2+.  Skin: Warm and dry.  Musculoskeletal: No kyphosis.  Neuropsychiatric: Alert and oriented x3, affect grossly appropriate.   ECG: ECG is ordered today and reviewed showing sinus rhythm with left atrial enlargement and decreased R wave progression. Nonspecific ST changes.   Recent Labwork: 12/17/2013: Pro B Natriuretic peptide (BNP) 232.9* 12/19/2013: BUN 15; Creatinine 1.03; Potassium 4.0; Sodium 141 04/12/2014: Hemoglobin 14.9; Platelets 312.0   Other Studies Reviewed Today:  Echocardiogram from June 2015 reported moderate LVH with LVEF 16-07%, grade 1 diastolic dysfunction, mild right atrial enlargement.  Assessment and Plan:  1. No active angina symptoms with history of multivessel CAD status post CABG associated with known graft disease. Continue medical therapy and observation.  2. Hyperlipidemia, continues on Lipitor. LDL 79 in February of this year.  3. History of asthma, recent cough and sinus trouble and the pollen season. He is currently on prednisone and antibiotics with plans for further allergy testing.  4. Lupus anticoagulant positive, on chronic Coumadin.  Current medicines were reviewed with the patient today.   Orders Placed This Encounter  Procedures  . EKG 12-Lead    Disposition: FU with me in 4 months.   Signed, Satira Sark, MD, [facility] 11/01/2014 2:23 PM    [facility] [place], [place], [place] [address] Phone: [phone]; Fax: [phone]

## 2014-11-01 NOTE — Patient Instructions (Signed)
Your physician recommends that you continue on your current medications as directed. Please refer to the Current Medication list given to you today. Your physician recommends that you schedule a follow-up appointment in: 4 months. You will receive a reminder letter in the mail in about 1-2 months reminding you to call and schedule your appointment. If you don't receive this letter, please contact our office. 

## 2014-11-16 ENCOUNTER — Ambulatory Visit (INDEPENDENT_AMBULATORY_CARE_PROVIDER_SITE_OTHER): Payer: Medicare Other | Admitting: Pulmonary Disease

## 2014-11-16 ENCOUNTER — Encounter: Payer: Self-pay | Admitting: Pulmonary Disease

## 2014-11-16 VITALS — BP 154/82 | HR 89 | Ht 68.0 in | Wt 181.0 lb

## 2014-11-16 DIAGNOSIS — J449 Chronic obstructive pulmonary disease, unspecified: Secondary | ICD-10-CM

## 2014-11-16 MED ORDER — TIOTROPIUM BROMIDE MONOHYDRATE 18 MCG IN CAPS: 18.0000 ug | ORAL_CAPSULE | Freq: Every day | RESPIRATORY_TRACT | Status: AC

## 2014-11-16 MED ORDER — DOXYCYCLINE HYCLATE 50 MG PO CAPS: 100.0000 mg | ORAL_CAPSULE | Freq: Every day | ORAL | Status: DC

## 2014-11-16 MED ORDER — PREDNISONE 10 MG PO TABS
ORAL_TABLET | ORAL | Status: DC
Start: 2014-11-16 — End: 2015-01-18

## 2014-11-16 NOTE — Patient Instructions (Signed)
Take the prednisone for the next 3 weeks Keep your skin testing visit on June 1 Keep taking inhaled therapies as you're doing We will see you back in July

## 2014-11-16 NOTE — Assessment & Plan Note (Addendum)
Unfortunately Henry [name] remains steroid dependent. We have been unable to identify the allergen leading to his problems up until this point. He is scheduled for skin testing on June 1 with our allergy specialist Dr. Annamaria Boots.  Because of all the mucus that he produces I worry that he has an allergic type bronchitis (like allergic bronchopulmonary aspergillosis).  Plan: Follow-up with allergy/skin testing Take prednisone for the next 3 weeks, slowly tapering off Continue Breo and Spiriva Continue to take doxycycline first 5 days of the month Follow-up in July with me

## 2014-11-16 NOTE — Progress Notes (Signed)
Subjective:    Patient ID: Henry [name], male    DOB: 1940/11/10, 74 y.o.   MRN: [id number]  Synopsis: GOLD Grade C COPD 02/2014 Spirometry > airflow obstruction FEV1 65% pred Serum IgE from November 2015 was elevated at 128 Serum Rast testing was negative across the board   HPI Chief Complaint  Patient presents with  . Follow-up    pt c/o some chest congestion, prod cough with white mucus.  had pred taper called in approx 2 weeks ago.    Henry [name] missed his allergy appointment because his wife fell and broke 4 ribs in March.  He ended up coming off of the prednisone and then he had a flare up with cough, mucus production and wheezing.  He said that prednisone we called in worked well for him.  He was coughing up a large "hunk" of pale green mucus. He took the prednisone up until 4/20, then he noted increasing dyspnea and cough.   HE continues to take the Spiriva regularly and Breo.  He doesn't qualify for financial assistance.  He needs refills on Spiriva and doxycyclne.    Past Medical History  Diagnosis Date  . Coronary atherosclerosis of native coronary artery     a. NSTEMI 2005 s/p CABG (LIMA - LAD, SVG - OM, SVG - PDA). b. DES to Cx 02/2005. c. NSTEMI 07/2010 due to occ Cx - med rx. Known occluded vein grafts.  . Essential hypertension, benign   . Type 2 diabetes mellitus   . Dyslipidemia   . ACE inhibitor intolerance   . Asthma   . Recurrent pulmonary embolism     a. 01/2011. b. Bilateral PE 11/2011 - + lupus anticoagulant preliminary testing. Felt to require lifelong Coumadin.  . Pulmonary nodule     RML by CT 11/2011, stable compared to 2012.  Marland Kitchen Cholelithiasis   . Pulmonary HTN     Echo 11/2011 at time of PE. PA pressure not assessed on 03/2012 echo but RV was back to normal.  . Lupus anticoagulant positive   . GERD (gastroesophageal reflux disease)   . COPD (chronic obstructive pulmonary disease)   . OSA (obstructive sleep apnea)      Review of Systems  Constitutional:  Negative for fever, chills and fatigue.  HENT: Negative for postnasal drip, rhinorrhea and sinus pressure.   Respiratory: Negative for cough, shortness of breath and wheezing.   Cardiovascular: Negative for chest pain, palpitations and leg swelling.       Objective:   Physical Exam  Filed Vitals:   11/16/14 1459  BP: 154/82  Pulse: 89  Height: 5\' 8"  (1.727 m)  Weight: 181 lb (82.101 kg)  SpO2: 92%  RA  Gen: no acute distress HEENT: NCAT, EOMi, OP clear, PULM: Minimal wheezing RUL CV: RRR, no mgr, no JVD AB: BS+, soft, nontender,  Ext: warm, no edema, no clubbing, no cyanosis Derm: no rash or skin breakdown Neuro: A&Ox4, MAEW  Serum IgE from November 2015 was elevated at 128 Serum Rast testing was negative across the board     Assessment & Plan:   COPD, moderate Unfortunately Mr. Laforest remains steroid dependent. We have been unable to identify the allergen leading to his problems up until this point. He is scheduled for skin testing on June 1 with our allergy specialist Dr. Annamaria Boots.  Because of all the mucus that he produces I worry that he has an allergic type bronchitis (like allergic bronchopulmonary aspergillosis).  Plan: Follow-up with allergy/skin testing Take  prednisone for the next 3 weeks, slowly tapering off Continue Breo and Spiriva Continue to take doxycycline first 5 days of the month Follow-up in July with me     Updated Medication List Outpatient Encounter Prescriptions as of 11/16/2014  Medication Sig  . acetaminophen (TYLENOL) 325 MG tablet Take 650 mg by mouth every 4 (four) hours as needed. For pain  . albuterol (PROAIR HFA) 108 (90 BASE) MCG/ACT inhaler Inhale 2 puffs into the lungs as needed.  Marland Kitchen atorvastatin (LIPITOR) 40 MG tablet Take 80 mg by mouth daily.   . chlorthalidone (HYGROTON) 25 MG tablet TAKE 1 TABLET BY MOUTH DAILY  . Coenzyme Q10 (CO Q 10) 10 MG CAPS Take 200 mg by mouth daily.  Marland Kitchen doxycycline (VIBRAMYCIN) 50 MG capsule Take 2  capsules (100 mg total) by mouth daily.  Marland Kitchen esomeprazole (NEXIUM) 40 MG capsule Take 40 mg by mouth daily before breakfast.  . ferrous sulfate 325 (65 FE) MG tablet Take 325 mg by mouth daily.  . fish oil-omega-3 fatty acids 1000 MG capsule Take 2 g by mouth daily.   . fluticasone (FLONASE) 50 MCG/ACT nasal spray Place 2 sprays into the nose daily.    . Fluticasone Furoate-Vilanterol (BREO ELLIPTA) 100-25 MCG/INH AEPB Inhale 1 puff into the lungs as needed.  Marland Kitchen HYDROcodone-acetaminophen (NORCO) 7.5-325 MG per tablet 1 tablet twice daily as needed  . isosorbide mononitrate (IMDUR) 60 MG 24 hr tablet TAKE 3 TABLETS BY MOUTH ONCE DAILY  . losartan (COZAAR) 100 MG tablet Take 1 tablet (100 mg total) by mouth daily.  . metFORMIN (GLUCOPHAGE) 500 MG tablet Take 1,000 mg by mouth 2 (two) times daily with a meal.   . metoprolol (LOPRESSOR) 50 MG tablet TAKE 1 AND 1/2 TABLETS BY MOUTH TWICE DAILY  . nitroGLYCERIN (NITROSTAT) 0.4 MG SL tablet Place 1 tablet (0.4 mg total) under the tongue every 5 (five) minutes x 3 doses as needed. For chest pain  . roflumilast (DALIRESP) 500 MCG TABS tablet Take 1 tablet (500 mcg total) by mouth daily.  Marland Kitchen tiotropium (SPIRIVA) 18 MCG inhalation capsule Place 18 mcg into inhaler and inhale daily.  . verapamil (CALAN-SR) 240 MG CR tablet TAKE 1 TABLET (240 MG TOTAL) BY MOUTH DAILY.  Marland Kitchen warfarin (COUMADIN) 5 MG tablet Take 5 mg by mouth as directed. MANAGED BY PMD  . zolpidem (AMBIEN) 5 MG tablet Take 5 mg by mouth at bedtime.   . predniSONE (DELTASONE) 10 MG tablet Take 30mg  daily for 7 days, then 20mg  daily for 7 days, then 10mg  dialy for 7 days  . [DISCONTINUED] predniSONE (DELTASONE) 10 MG tablet Take 40mg  for 3 days, 30mg  for 3 days, 20mg  for two days, 10mg  po daily for 2 days (Patient not taking: Reported on 11/16/2014)   No facility-administered encounter medications on file as of 11/16/2014.

## 2014-12-09 ENCOUNTER — Other Ambulatory Visit: Payer: Self-pay | Admitting: Cardiology

## 2014-12-14 ENCOUNTER — Other Ambulatory Visit: Payer: Medicare Other

## 2014-12-14 ENCOUNTER — Ambulatory Visit (INDEPENDENT_AMBULATORY_CARE_PROVIDER_SITE_OTHER): Payer: Medicare Other | Admitting: Internal Medicine

## 2014-12-14 ENCOUNTER — Encounter: Payer: Self-pay | Admitting: Internal Medicine

## 2014-12-14 VITALS — BP 130/60 | HR 74 | Ht 68.0 in | Wt 184.0 lb

## 2014-12-14 DIAGNOSIS — J449 Chronic obstructive pulmonary disease, unspecified: Secondary | ICD-10-CM

## 2014-12-14 DIAGNOSIS — J441 Chronic obstructive pulmonary disease with (acute) exacerbation: Secondary | ICD-10-CM

## 2014-12-14 NOTE — Assessment & Plan Note (Signed)
Question is whether there is an allergic basis for his episodic rhinitis and bronchitis. His hx doesn't sound very atopic. He has some pending allergy lab orders, focused especially on possible ABPA. Plan- Aspergillus antibody panel, total IgE, CBC w diff for eosinophils.  Since he took claritin today, we will bring him back for allergy skin testing off antihistamines.

## 2014-12-14 NOTE — Patient Instructions (Signed)
Order- lab today for Total IgE and Aspergillus IgE 81157, Aspergillus IgE 81149, aspergillus antibody screen [phone]    Dx chronic asthmatic bronchitis  Order- schedule allergy skin testing- need to be off all antihistamines, including claritin, for at least 3 days before skin testing

## 2014-12-14 NOTE — Progress Notes (Signed)
12/14/14- 89 yoM former smoker referred courtesy of Dr Lake Bells for allergy evaluation because of COPD exacerbations. Medical hx of lupus anticoagulant/ recurrent PE, DM2, CAD/ MI/CABG, HBP   Wife here. Allergy profile 06/14/14- all negative Total IgE and Aspergilllus IgE pending Eos 13.6( absolute 1.8) Pt describes adult onset asthma and sinus problems , with more frequent episodes of "congestion" in the last 2 years. Maybe worse in winter. Typically well until acute nasal congestion and discharge, moving over 2 days into chest.  He had worked in a [employer] technologist, then for the [employer] of [employer].  Never thought of himself as an allergic person- denies significant relation to pollen seasons, and no special problems with foods, insect stings or skin rashes/ hives. Feels better on prednisone. Recently finished another taper. Taking daily claritin.  Sinus surgery 2008.  Prior to Admission medications   Medication Sig Start Date End Date Taking? Authorizing Provider  acetaminophen (TYLENOL) 325 MG tablet Take 650 mg by mouth every 4 (four) hours as needed. For pain   Yes Historical Provider, MD  albuterol (PROAIR HFA) 108 (90 BASE) MCG/ACT inhaler Inhale 2 puffs into the lungs as needed. 02/11/14 02/11/15 Yes Historical Provider, MD  atorvastatin (LIPITOR) 40 MG tablet Take 80 mg by mouth daily.    Yes Historical Provider, MD  chlorthalidone (HYGROTON) 25 MG tablet TAKE 1 TABLET BY MOUTH DAILY 12/09/14  Yes Satira Sark, MD  Coenzyme Q10 (CO Q 10) 10 MG CAPS Take 200 mg by mouth daily.   Yes Historical Provider, MD  doxycycline (VIBRAMYCIN) 50 MG capsule Take 2 capsules (100 mg total) by mouth daily. 11/16/14  Yes Juanito Doom, MD  esomeprazole (NEXIUM) 40 MG capsule Take 40 mg by mouth daily before breakfast.   Yes Historical Provider, MD  ferrous sulfate 325 (65 FE) MG tablet Take 325 mg by mouth daily.   Yes Historical Provider, MD  fish oil-omega-3 fatty acids 1000  MG capsule Take 2 g by mouth daily.    Yes Historical Provider, MD  fluticasone (FLONASE) 50 MCG/ACT nasal spray Place 2 sprays into the nose daily.     Yes Historical Provider, MD  Fluticasone Furoate-Vilanterol (BREO ELLIPTA) 100-25 MCG/INH AEPB Inhale 1 puff into the lungs as needed.   Yes Historical Provider, MD  HYDROcodone-acetaminophen (NORCO) 7.5-325 MG per tablet 1 tablet twice daily as needed 02/17/14  Yes Historical Provider, MD  isosorbide mononitrate (IMDUR) 60 MG 24 hr tablet TAKE 3 TABLETS BY MOUTH ONCE DAILY 08/12/14  Yes Satira Sark, MD  losartan (COZAAR) 100 MG tablet Take 1 tablet (100 mg total) by mouth daily. 06/20/14  Yes Satira Sark, MD  metFORMIN (GLUCOPHAGE) 500 MG tablet Take 1,000 mg by mouth 2 (two) times daily with a meal.    Yes Historical Provider, MD  metoprolol (LOPRESSOR) 50 MG tablet TAKE 1 AND 1/2 TABLETS BY MOUTH TWICE DAILY 10/17/14  Yes Satira Sark, MD  nitroGLYCERIN (NITROSTAT) 0.4 MG SL tablet Place 1 tablet (0.4 mg total) under the tongue every 5 (five) minutes x 3 doses as needed. For chest pain 04/05/14  Yes Satira Sark, MD  predniSONE (DELTASONE) 10 MG tablet Take 30mg  daily for 7 days, then 20mg  daily for 7 days, then 10mg  dialy for 7 days 11/16/14  Yes Juanito Doom, MD  tiotropium (SPIRIVA) 18 MCG inhalation capsule Place 1 capsule (18 mcg total) into inhaler and inhale daily. 11/16/14  Yes Juanito Doom, MD  verapamil (CALAN-SR) 240 MG CR tablet TAKE 1 TABLET (240 MG TOTAL) BY MOUTH DAILY. 05/20/14  Yes Satira Sark, MD  warfarin (COUMADIN) 5 MG tablet Take 5 mg by mouth as directed. MANAGED BY PMD   Yes Historical Provider, MD  zolpidem (AMBIEN) 5 MG tablet Take 5 mg by mouth at bedtime.    Yes Historical Provider, MD   Past Medical History  Diagnosis Date  . Coronary atherosclerosis of native coronary artery     a. NSTEMI 2005 s/p CABG (LIMA - LAD, SVG - OM, SVG - PDA). b. DES to Cx 02/2005. c. NSTEMI 07/2010 due to occ Cx -  med rx. Known occluded vein grafts.  . Essential hypertension, benign   . Type 2 diabetes mellitus   . Dyslipidemia   . ACE inhibitor intolerance   . Asthma   . Recurrent pulmonary embolism     a. 01/2011. b. Bilateral PE 11/2011 - + lupus anticoagulant preliminary testing. Felt to require lifelong Coumadin.  . Pulmonary nodule     RML by CT 11/2011, stable compared to 2012.  Marland Kitchen Cholelithiasis   . Pulmonary HTN     Echo 11/2011 at time of PE. PA pressure not assessed on 03/2012 echo but RV was back to normal.  . Lupus anticoagulant positive   . GERD (gastroesophageal reflux disease)   . COPD (chronic obstructive pulmonary disease)   . OSA (obstructive sleep apnea)    Past Surgical History  Procedure Laterality Date  . Coronary artery bypass graft  2005     LIMA to LAD, SVG to OM, SVG to RCA  . L4-l5 laminectomy    . Colonoscopy    . Esophagogastroduodenoscopy N/A 12/19/2013    Procedure: ESOPHAGOGASTRODUODENOSCOPY (EGD);  Surgeon: Lafayette Dragon, MD;  Location: [facility] ENDOSCOPY;  Service: Endoscopy;  Laterality: N/A;  . Kidney stone removed     Family History  Problem Relation Age of Onset  . Heart disease Father   . Heart disease Sister   . Breast cancer Sister   . Heart disease Brother    History   Social History  . Marital Status: Married    Spouse Name: N/A  . Number of Children: N/A  . Years of Education: N/A   Occupational History  . Not on file.   Social History Main Topics  . Smoking status: Former Smoker -- 0.30 packs/day for 30 years    Types: Cigarettes    Start date: 09/28/1945    Quit date: 07/15/1972  . Smokeless tobacco: Current User    Types: Chew     Comment: chews 1/2 pack tobacco per day  . Alcohol Use: No  . Drug Use: No  . Sexual Activity: Not on file   Other Topics Concern  . Not on file   Social History Narrative   Married, retired.    ROS-see HPI   Negative unless "+" Constitutional:    weight loss, night sweats, fevers, chills, fatigue,  lassitude. HEENT:    +headaches, difficulty swallowing, +tooth/dental problems, sore throat,       sneezing, itching, ear ache, +nasal congestion, post nasal drip, snoring CV:    chest pain, orthopnea, PND, swelling in lower extremities, anasarca,                                  dizziness, palpitations Resp:   +shortness of breath with exertion or at rest.  productive cough,   +non-productive cough, coughing up of blood.              change in color of mucus.  wheezing.   Skin:    rash or lesions. GI:  No-   +heartburn,+ indigestion, abdominal pain, nausea, vomiting, diarrhea,                 change in bowel habits, loss of appetite GU: dysuria, change in color of urine, no urgency or frequency.   flank pain. MS:   +joint pain, stiffness, decreased range of motion, back pain. Neuro-     nothing unusual Psych:  change in mood or affect.  depression or anxiety.   memory loss.  OBJ- Physical Exam General- Alert, Oriented, Affect-appropriate, Distress- none acute Skin- rash-none, lesions- none, excoriation- none Lymphadenopathy- none Head- atraumatic            Eyes- Gross vision intact, PERRLA, conjunctivae and secretions clear            Ears- Hearing decreased+            Nose- Clear, no-Septal dev, mucus, polyps, erosion, perforation             Throat- Mallampati II , mucosa clear , drainage- none, tonsils- atrophic Neck- flexible , trachea midline, no stridor , thyroid nl, carotid no bruit Chest - symmetrical excursion , unlabored           Heart/CV- RRR , no murmur , no gallop  , no rub, nl s1 s2                           - JVD- none , edema- none, stasis changes- none, varices- none           Lung- clear to P&A, wheeze- none, cough- none , dullness-none, rub- none           Chest wall- Sternotomy scar+ Abd-  Br/ Gen/ Rectal- Not done, not indicated Extrem- cyanosis- none, clubbing, none, atrophy- none, strength- nl Neuro- grossly intact to observation

## 2014-12-15 LAB — IGE: IgE (Immunoglobulin E), Serum: 84 kU/L (ref <115)

## 2014-12-19 LAB — ASPERGILLUS IGE PANEL
Aspergillus Amstel/glauc IgE: <0.35 kU/L (ref <0.35)
Aspergillus IgE Panel: <0.1 kU/L (ref <0.35)
Aspergillus Versicolor IgE: <0.1 kU/L (ref <0.35)
Aspergillus flavus IgE: <0.35 kU/L (ref <0.35)
CLASS: 0
CLASS: 0
CLASS: 0
Class: 0
Class: 0
Class: 0
Class: 0

## 2014-12-19 LAB — ASPERGILLUS ANTIBODY BY IMMUNODIFF: ASPERGILLUS ANTIBODY ID: NEGATIVE

## 2014-12-21 ENCOUNTER — Telehealth: Payer: Self-pay | Admitting: Internal Medicine

## 2014-12-21 NOTE — Telephone Encounter (Signed)
Patient's wife notified of lab results. Nothing further needed.  

## 2015-01-18 ENCOUNTER — Ambulatory Visit (INDEPENDENT_AMBULATORY_CARE_PROVIDER_SITE_OTHER): Payer: Medicare Other | Admitting: Internal Medicine

## 2015-01-18 ENCOUNTER — Other Ambulatory Visit: Payer: Self-pay | Admitting: *Deleted

## 2015-01-18 ENCOUNTER — Encounter: Payer: Self-pay | Admitting: Internal Medicine

## 2015-01-18 VITALS — BP 122/68 | HR 78 | Ht 68.0 in | Wt 185.0 lb

## 2015-01-18 DIAGNOSIS — J449 Chronic obstructive pulmonary disease, unspecified: Secondary | ICD-10-CM | POA: Diagnosis not present

## 2015-01-18 MED ORDER — LOSARTAN POTASSIUM 100 MG PO TABS: 100.0000 mg | ORAL_TABLET | Freq: Every day | ORAL | Status: DC

## 2015-01-18 NOTE — Progress Notes (Signed)
12/14/14- 7 yoM former smoker referred courtesy of Dr Lake Bells for allergy evaluation because of COPD exacerbations. Medical hx of lupus anticoagulant/ recurrent PE, DM2, CAD/ MI/CABG, HBP   Wife here. Allergy profile 06/14/14- all negative Total IgE and Aspergilllus IgE pending Eos 13.6( absolute 1.8) Pt describes adult onset asthma and sinus problems , with more frequent episodes of "congestion" in the last 2 years. Maybe worse in winter. Typically well until acute nasal congestion and discharge, moving over 2 days into chest.  He had worked in a [employer] technologist, then for the [employer] of [employer].  Never thought of himself as an allergic person- denies significant relation to pollen seasons, and no special problems with foods, insect stings or skin rashes/ hives. Feels better on prednisone. Recently finished another taper. Taking daily claritin.  Sinus surgery 2008.  01/18/15- 74 yoM former smoker referred courtesy of Dr Lake Bells for allergy evaluation because of COPD exacerbations. Medical hx of lupus anticoagulant/ recurrent PE, DM2, CAD/ MI/CABG, HBP   Wife here. For allergy skin testing:No antihistamines, OTC cough syrups, or OTC sleep aids in past 3 days Noting increased nose and chest congestion in his typical pattern last 2-3 days with no fever or sore throat; doesn't feel sick. Allergy skin test 01/18/15- positive for dust mite and maple tree pollen, with mild positives for grass pollens, ragweed, cat and dog.   ROS-see HPI   Negative unless "+" Constitutional:    weight loss, night sweats, fevers, chills, fatigue, lassitude. HEENT:    +headaches, difficulty swallowing, +tooth/dental problems, sore throat,       sneezing, itching, ear ache, +nasal congestion, post nasal drip, snoring CV:    chest pain, orthopnea, PND, swelling in lower extremities, anasarca,                                  dizziness, palpitations Resp:   +shortness of breath with exertion or at rest.                 +productive cough,   +non-productive cough, coughing up of blood.              +change in color of mucus.  wheezing.   Skin:    rash or lesions. GI:  No-   +heartburn,+ indigestion, abdominal pain, nausea, vomiting GU:  MS:   +joint pain, stiffness, decreased range of motion, back pain. Neuro-     nothing unusual Psych:  change in mood or affect.  depression or anxiety.   memory loss.  OBJ- Physical Exam General- Alert, Oriented, Affect-appropriate, Distress- none acute Skin- rash-none, lesions- none, excoriation- none Lymphadenopathy- none Head- atraumatic            Eyes- Gross vision intact, PERRLA, conjunctivae and secretions clear            Ears- Hearing decreased+            Nose- Clear, no-Septal dev, mucus, polyps, erosion, perforation             Throat- Mallampati II , mucosa clear , drainage- none, tonsils- atrophic Neck- flexible , trachea midline, no stridor , thyroid nl, carotid no bruit Chest - symmetrical excursion , unlabored           Heart/CV- RRR , no murmur , no gallop  , no rub, nl s1 s2                           -  JVD- none , edema- none, stasis changes- none, varices- none           Lung- +mild rhonchi bilat, wheeze- none, cough- none , dullness-none, rub- none           Chest wall- Sternotomy scar+ Abd-  Br/ Gen/ Rectal- Not done, not indicated Extrem- cyanosis- none, clubbing, none, atrophy- none, strength- nl Neuro- grossly intact to observation

## 2015-01-18 NOTE — Patient Instructions (Addendum)
The allergy tests do not show enough reaction to explain the problems you have had.   You and Dr Lake Bells might decide to stay on very low dose prednisone

## 2015-01-19 NOTE — Assessment & Plan Note (Signed)
Skin test results don't look impressive and i would not expect them to reflect the recurrent asthma/ bronchitis and rhinitis syndrome he describes. Allergy vaccine could be tried if nothing else works, but he responds well to prednisone, so I suggested he talk with Dr Lake Bells about a maintenance trial of low dose prednisone- 5 or 10 mg every other day.

## 2015-01-23 ENCOUNTER — Encounter: Payer: Self-pay | Admitting: Internal Medicine

## 2015-02-05 ENCOUNTER — Inpatient Hospital Stay (HOSPITAL_COMMUNITY)
Admission: EM | Admit: 2015-02-05 | Discharge: 2015-02-09 | DRG: 191 | Disposition: A | Discharge status: Home / Self Care | Payer: Medicare Other | Attending: Internal Medicine | Admitting: Internal Medicine

## 2015-02-05 ENCOUNTER — Emergency Department (HOSPITAL_COMMUNITY): Payer: Medicare Other

## 2015-02-05 ENCOUNTER — Encounter (HOSPITAL_COMMUNITY): Payer: Self-pay

## 2015-02-05 DIAGNOSIS — I25810 Atherosclerosis of coronary artery bypass graft(s) without angina pectoris: Secondary | ICD-10-CM | POA: Diagnosis present

## 2015-02-05 DIAGNOSIS — N183 Chronic kidney disease, stage 3 unspecified: Secondary | ICD-10-CM | POA: Diagnosis present

## 2015-02-05 DIAGNOSIS — R7989 Other specified abnormal findings of blood chemistry: Secondary | ICD-10-CM

## 2015-02-05 DIAGNOSIS — E872 Acidosis, unspecified: Secondary | ICD-10-CM

## 2015-02-05 DIAGNOSIS — Z86711 Personal history of pulmonary embolism: Secondary | ICD-10-CM | POA: Diagnosis not present

## 2015-02-05 DIAGNOSIS — R778 Other specified abnormalities of plasma proteins: Secondary | ICD-10-CM

## 2015-02-05 DIAGNOSIS — T380X5A Adverse effect of glucocorticoids and synthetic analogues, initial encounter: Secondary | ICD-10-CM | POA: Diagnosis present

## 2015-02-05 DIAGNOSIS — E118 Type 2 diabetes mellitus with unspecified complications: Secondary | ICD-10-CM

## 2015-02-05 DIAGNOSIS — J441 Chronic obstructive pulmonary disease with (acute) exacerbation: Principal | ICD-10-CM

## 2015-02-05 DIAGNOSIS — I129 Hypertensive chronic kidney disease with stage 1 through stage 4 chronic kidney disease, or unspecified chronic kidney disease: Secondary | ICD-10-CM | POA: Diagnosis present

## 2015-02-05 DIAGNOSIS — C9400 Acute erythroid leukemia, not having achieved remission: Secondary | ICD-10-CM | POA: Diagnosis present

## 2015-02-05 DIAGNOSIS — E785 Hyperlipidemia, unspecified: Secondary | ICD-10-CM

## 2015-02-05 DIAGNOSIS — I248 Other forms of acute ischemic heart disease: Secondary | ICD-10-CM | POA: Diagnosis present

## 2015-02-05 DIAGNOSIS — S81801A Unspecified open wound, right lower leg, initial encounter: Secondary | ICD-10-CM

## 2015-02-05 DIAGNOSIS — Z951 Presence of aortocoronary bypass graft: Secondary | ICD-10-CM

## 2015-02-05 DIAGNOSIS — R079 Chest pain, unspecified: Secondary | ICD-10-CM | POA: Diagnosis not present

## 2015-02-05 DIAGNOSIS — I10 Essential (primary) hypertension: Secondary | ICD-10-CM | POA: Diagnosis not present

## 2015-02-05 DIAGNOSIS — I272 Other secondary pulmonary hypertension: Secondary | ICD-10-CM | POA: Diagnosis present

## 2015-02-05 DIAGNOSIS — I252 Old myocardial infarction: Secondary | ICD-10-CM | POA: Diagnosis not present

## 2015-02-05 DIAGNOSIS — G4733 Obstructive sleep apnea (adult) (pediatric): Secondary | ICD-10-CM | POA: Diagnosis present

## 2015-02-05 DIAGNOSIS — R911 Solitary pulmonary nodule: Secondary | ICD-10-CM

## 2015-02-05 DIAGNOSIS — K5900 Constipation, unspecified: Secondary | ICD-10-CM | POA: Diagnosis present

## 2015-02-05 DIAGNOSIS — J449 Chronic obstructive pulmonary disease, unspecified: Secondary | ICD-10-CM

## 2015-02-05 DIAGNOSIS — E1169 Type 2 diabetes mellitus with other specified complication: Secondary | ICD-10-CM | POA: Diagnosis present

## 2015-02-05 DIAGNOSIS — I25118 Atherosclerotic heart disease of native coronary artery with other forms of angina pectoris: Secondary | ICD-10-CM | POA: Diagnosis not present

## 2015-02-05 DIAGNOSIS — N179 Acute kidney failure, unspecified: Secondary | ICD-10-CM

## 2015-02-05 DIAGNOSIS — R05 Cough: Secondary | ICD-10-CM | POA: Diagnosis present

## 2015-02-05 DIAGNOSIS — D131 Benign neoplasm of stomach: Secondary | ICD-10-CM

## 2015-02-05 DIAGNOSIS — R0989 Other specified symptoms and signs involving the circulatory and respiratory systems: Secondary | ICD-10-CM

## 2015-02-05 DIAGNOSIS — Z8719 Personal history of other diseases of the digestive system: Secondary | ICD-10-CM

## 2015-02-05 DIAGNOSIS — Z87891 Personal history of nicotine dependence: Secondary | ICD-10-CM | POA: Diagnosis not present

## 2015-02-05 DIAGNOSIS — E1122 Type 2 diabetes mellitus with diabetic chronic kidney disease: Secondary | ICD-10-CM

## 2015-02-05 DIAGNOSIS — N182 Chronic kidney disease, stage 2 (mild): Secondary | ICD-10-CM

## 2015-02-05 DIAGNOSIS — I25119 Atherosclerotic heart disease of native coronary artery with unspecified angina pectoris: Secondary | ICD-10-CM | POA: Diagnosis present

## 2015-02-05 DIAGNOSIS — Z7901 Long term (current) use of anticoagulants: Secondary | ICD-10-CM

## 2015-02-05 DIAGNOSIS — R0602 Shortness of breath: Secondary | ICD-10-CM

## 2015-02-05 DIAGNOSIS — R760 Raised antibody titer: Secondary | ICD-10-CM

## 2015-02-05 LAB — BRAIN NATRIURETIC PEPTIDE: B Natriuretic Peptide: 120.1 pg/mL — ABNORMAL HIGH (ref 0.0–100.0)

## 2015-02-05 LAB — CBC
HCT: 43 % (ref 39.0–52.0)
Hemoglobin: 14.1 g/dL (ref 13.0–17.0)
MCH: 29.6 pg (ref 26.0–34.0)
MCHC: 32.8 g/dL (ref 30.0–36.0)
MCV: 90.3 fL (ref 78.0–100.0)
Platelets: 330 10*3/uL (ref 150–400)
RBC: 4.76 MIL/uL (ref 4.22–5.81)
RDW: 14.6 % (ref 11.5–15.5)
WBC: 15.4 10*3/uL — ABNORMAL HIGH (ref 4.0–10.5)

## 2015-02-05 LAB — I-STAT ARTERIAL BLOOD GAS, ED
ACID-BASE DEFICIT: 10 mmol/L — AB (ref 0.0–2.0)
Bicarbonate: 14.4 mEq/L — ABNORMAL LOW (ref 20.0–24.0)
O2 Saturation: 97 %
PCO2 ART: 27.4 mmHg — AB (ref 35.0–45.0)
PO2 ART: 98 mmHg (ref 80.0–100.0)
TCO2: 15 mmol/L (ref 0–100)
pH, Arterial: 7.327 — ABNORMAL LOW (ref 7.350–7.450)

## 2015-02-05 LAB — HEPATIC FUNCTION PANEL
ALBUMIN: 3.6 g/dL (ref 3.5–5.0)
ALT: 32 U/L (ref 17–63)
AST: 43 U/L — AB (ref 15–41)
Alkaline Phosphatase: 32 U/L — ABNORMAL LOW (ref 38–126)
Bilirubin, Direct: 0.1 mg/dL (ref 0.1–0.5)
Indirect Bilirubin: 0.2 mg/dL — ABNORMAL LOW (ref 0.3–0.9)
TOTAL PROTEIN: 7.1 g/dL (ref 6.5–8.1)
Total Bilirubin: 0.3 mg/dL (ref 0.3–1.2)

## 2015-02-05 LAB — BASIC METABOLIC PANEL
Anion gap: 18 — ABNORMAL HIGH (ref 5–15)
BUN: 31 mg/dL — ABNORMAL HIGH (ref 6–20)
CHLORIDE: 101 mmol/L (ref 101–111)
CO2: 18 mmol/L — ABNORMAL LOW (ref 22–32)
Calcium: 9.1 mg/dL (ref 8.9–10.3)
Creatinine, Ser: 1.39 mg/dL — ABNORMAL HIGH (ref 0.61–1.24)
GFR calc Af Amer: 56 mL/min — ABNORMAL LOW (ref >60)
GFR calc non Af Amer: 48 mL/min — ABNORMAL LOW (ref >60)
Glucose, Bld: 175 mg/dL — ABNORMAL HIGH (ref 65–99)
Potassium: 4.4 mmol/L (ref 3.5–5.1)
Sodium: 137 mmol/L (ref 135–145)

## 2015-02-05 LAB — I-STAT TROPONIN, ED: Troponin i, poc: 0.04 ng/mL (ref 0.00–0.08)

## 2015-02-05 LAB — TROPONIN I
Troponin I: 0.03 ng/mL (ref <0.031)
Troponin I: 0.06 ng/mL — ABNORMAL HIGH (ref <0.031)

## 2015-02-05 LAB — PROTIME-INR
INR: 3.01 — ABNORMAL HIGH (ref 0.00–1.49)
Prothrombin Time: 30.7 seconds — ABNORMAL HIGH (ref 11.6–15.2)

## 2015-02-05 LAB — I-STAT CG4 LACTIC ACID, ED
Lactic Acid, Venous: 8.77 mmol/L (ref 0.5–2.0)
Lactic Acid, Venous: 10.76 mmol/L (ref 0.5–2.0)

## 2015-02-05 LAB — GLUCOSE, CAPILLARY: Glucose-Capillary: 165 mg/dL — ABNORMAL HIGH (ref 65–99)

## 2015-02-05 LAB — MRSA PCR SCREENING: MRSA by PCR: NEGATIVE

## 2015-02-05 LAB — SALICYLATE LEVEL: Salicylate Lvl: <4 mg/dL (ref 2.8–30.0)

## 2015-02-05 MED ORDER — IPRATROPIUM-ALBUTEROL 0.5-2.5 (3) MG/3ML IN SOLN
3.0000 mL | Freq: Once | RESPIRATORY_TRACT | Status: AC
  Administered 2015-02-05: 3 mL via RESPIRATORY_TRACT
  Filled 2015-02-05: qty 42

## 2015-02-05 MED ORDER — IPRATROPIUM BROMIDE 0.02 % IN SOLN
0.5000 mg | Freq: Four times a day (QID) | RESPIRATORY_TRACT | Status: DC
  Administered 2015-02-06: 0.5 mg via RESPIRATORY_TRACT
  Filled 2015-02-05: qty 2.5

## 2015-02-05 MED ORDER — LEVOFLOXACIN IN D5W 750 MG/150ML IV SOLN
750.0000 mg | INTRAVENOUS | Status: DC
  Administered 2015-02-07: 750 mg via INTRAVENOUS
  Filled 2015-02-05 (×2): qty 150

## 2015-02-05 MED ORDER — LEVALBUTEROL HCL 0.63 MG/3ML IN NEBU
0.6300 mg | INHALATION_SOLUTION | Freq: Four times a day (QID) | RESPIRATORY_TRACT | Status: DC
  Administered 2015-02-06: 0.63 mg via RESPIRATORY_TRACT
  Filled 2015-02-05 (×4): qty 3

## 2015-02-05 MED ORDER — LEVOFLOXACIN IN D5W 750 MG/150ML IV SOLN
750.0000 mg | INTRAVENOUS | Status: DC
  Filled 2015-02-05: qty 150

## 2015-02-05 MED ORDER — SODIUM CHLORIDE 0.9 % IV BOLUS (SEPSIS)
1000.0000 mL | Freq: Once | INTRAVENOUS | Status: AC
  Administered 2015-02-05: 1000 mL via INTRAVENOUS

## 2015-02-05 MED ORDER — WARFARIN - PHARMACIST DOSING INPATIENT: Freq: Every day | Status: DC

## 2015-02-05 MED ORDER — ISOSORBIDE MONONITRATE ER 60 MG PO TB24
180.0000 mg | ORAL_TABLET | Freq: Every day | ORAL | Status: DC
  Administered 2015-02-06 – 2015-02-09 (×4): 180 mg via ORAL
  Filled 2015-02-05 (×4): qty 3

## 2015-02-05 MED ORDER — HYDROCOD POLST-CPM POLST ER 10-8 MG/5ML PO SUER
5.0000 mL | Freq: Two times a day (BID) | ORAL | Status: DC | PRN
  Administered 2015-02-05 – 2015-02-07 (×3): 5 mL via ORAL
  Filled 2015-02-05 (×3): qty 5

## 2015-02-05 MED ORDER — SODIUM CHLORIDE 0.9 % IV SOLN
INTRAVENOUS | Status: DC
  Administered 2015-02-05: 23:00:00 via INTRAVENOUS

## 2015-02-05 MED ORDER — INSULIN ASPART 100 UNIT/ML ~~LOC~~ SOLN: 0.0000 [IU] | Freq: Three times a day (TID) | SUBCUTANEOUS | Status: DC

## 2015-02-05 MED ORDER — VERAPAMIL HCL ER 240 MG PO TBCR
240.0000 mg | EXTENDED_RELEASE_TABLET | Freq: Every day | ORAL | Status: DC
Start: 2015-02-06 — End: 2015-02-09
  Administered 2015-02-06 – 2015-02-09 (×4): 240 mg via ORAL
  Filled 2015-02-05 (×4): qty 1

## 2015-02-05 MED ORDER — ALBUTEROL SULFATE (2.5 MG/3ML) 0.083% IN NEBU
5.0000 mg | INHALATION_SOLUTION | Freq: Once | RESPIRATORY_TRACT | Status: AC
  Administered 2015-02-05: 5 mg via RESPIRATORY_TRACT
  Filled 2015-02-05: qty 6

## 2015-02-05 MED ORDER — SODIUM CHLORIDE 0.9 % IJ SOLN
3.0000 mL | Freq: Two times a day (BID) | INTRAMUSCULAR | Status: DC
  Administered 2015-02-05 – 2015-02-09 (×8): 3 mL via INTRAVENOUS

## 2015-02-05 MED ORDER — METHYLPREDNISOLONE SODIUM SUCC 125 MG IJ SOLR
125.0000 mg | Freq: Once | INTRAMUSCULAR | Status: AC
  Administered 2015-02-05: 125 mg via INTRAVENOUS
  Filled 2015-02-05: qty 2

## 2015-02-05 MED ORDER — LEVOFLOXACIN IN D5W 750 MG/150ML IV SOLN
750.0000 mg | Freq: Once | INTRAVENOUS | Status: AC
  Administered 2015-02-05: 750 mg via INTRAVENOUS
  Filled 2015-02-05: qty 150

## 2015-02-05 MED ORDER — METOPROLOL TARTRATE 50 MG PO TABS
75.0000 mg | ORAL_TABLET | Freq: Two times a day (BID) | ORAL | Status: DC
  Administered 2015-02-06 – 2015-02-09 (×8): 75 mg via ORAL
  Filled 2015-02-05 (×11): qty 1

## 2015-02-05 MED ORDER — NITROGLYCERIN 0.4 MG SL SUBL
0.4000 mg | SUBLINGUAL_TABLET | SUBLINGUAL | Status: AC | PRN
  Administered 2015-02-05 (×3): 0.4 mg via SUBLINGUAL
  Filled 2015-02-05: qty 1

## 2015-02-05 MED ORDER — NITROGLYCERIN IN D5W 200-5 MCG/ML-% IV SOLN
2.0000 ug/min | INTRAVENOUS | Status: DC
  Administered 2015-02-05: 5 ug/min via INTRAVENOUS
  Filled 2015-02-05: qty 250

## 2015-02-05 MED ORDER — LOSARTAN POTASSIUM 50 MG PO TABS
100.0000 mg | ORAL_TABLET | Freq: Every day | ORAL | Status: DC
  Administered 2015-02-06 – 2015-02-09 (×4): 100 mg via ORAL
  Filled 2015-02-05 (×4): qty 2

## 2015-02-05 MED ORDER — ASPIRIN 81 MG PO CHEW
324.0000 mg | CHEWABLE_TABLET | Freq: Once | ORAL | Status: AC
  Administered 2015-02-05: 324 mg via ORAL
  Filled 2015-02-05: qty 4

## 2015-02-05 MED ORDER — FLUTICASONE PROPIONATE 50 MCG/ACT NA SUSP
2.0000 | Freq: Every day | NASAL | Status: DC
  Administered 2015-02-06 – 2015-02-09 (×4): 2 via NASAL
  Filled 2015-02-05: qty 16

## 2015-02-05 MED ORDER — WHITE PETROLATUM GEL
Status: AC
  Administered 2015-02-05: 0.2
  Filled 2015-02-05: qty 1

## 2015-02-05 MED ORDER — ATORVASTATIN CALCIUM 80 MG PO TABS
80.0000 mg | ORAL_TABLET | Freq: Every day | ORAL | Status: DC
  Administered 2015-02-06 – 2015-02-09 (×4): 80 mg via ORAL
  Filled 2015-02-05 (×4): qty 1

## 2015-02-05 MED ORDER — PANTOPRAZOLE SODIUM 40 MG PO TBEC: 40.0000 mg | DELAYED_RELEASE_TABLET | Freq: Every day | ORAL | Status: DC

## 2015-02-05 MED ORDER — MORPHINE SULFATE 2 MG/ML IJ SOLN
2.0000 mg | Freq: Once | INTRAMUSCULAR | Status: AC
  Administered 2015-02-05: 2 mg via INTRAVENOUS
  Filled 2015-02-05: qty 1

## 2015-02-05 MED ORDER — INSULIN ASPART 100 UNIT/ML ~~LOC~~ SOLN: 0.0000 [IU] | Freq: Every day | SUBCUTANEOUS | Status: DC

## 2015-02-05 MED ORDER — BUDESONIDE 0.25 MG/2ML IN SUSP
0.2500 mg | Freq: Two times a day (BID) | RESPIRATORY_TRACT | Status: DC
  Administered 2015-02-06: 0.25 mg via RESPIRATORY_TRACT
  Filled 2015-02-05 (×4): qty 2

## 2015-02-05 MED ORDER — METHYLPREDNISOLONE SODIUM SUCC 125 MG IJ SOLR
60.0000 mg | Freq: Three times a day (TID) | INTRAMUSCULAR | Status: DC
  Administered 2015-02-05 – 2015-02-06 (×2): 60 mg via INTRAVENOUS
  Filled 2015-02-05 (×2): qty 0.96
  Filled 2015-02-05 (×2): qty 2
  Filled 2015-02-05: qty 0.96

## 2015-02-05 NOTE — Consult Note (Signed)
Name: LYRIC HOAR MRN: [id number] DOB: 1940-08-28    ADMISSION DATE:  02/05/2015 CONSULTATION DATE:  7/24  REFERRING MD :  Cruzita Lederer ([facility])   CHIEF COMPLAINT:  Dyspnea, rising lactate   BRIEF PATIENT DESCRIPTION:  74yo male with hx recurrent PE on chronic anticoagulation (lupus anticoag POS), CAD s/p CABG, COPD, pulm HTN, OSA presented 7/24 with SOB, chest congestion, cough and worsening chest pain.  Initial ER w/u revealed mild EKG changes, mild AKI and a surprisingly elevated lactate of 8.7->10.7.  Of note had negative troponin, hyPERtension and a metabolic acidosis. Pt admitted to SDU per [facility] and PCCM asked to consult.    SIGNIFICANT EVENTS    STUDIES:  2D echo 7/24>>> Xray RLE>>neg   HISTORY OF PRESENT ILLNESS: 74yo male with hx recurrent PE on chronic anticoagulation (lupus anticoag POS), CAD s/p CABG, COPD, pulm HTN, OSA presented 7/24 with SOB, chest congestion, cough and worsening chest pain.  Initial ER w/u revealed mild EKG changes, mild AKI and a surprisingly elevated lactate of 8.7->10.7.  Of note had negative troponin, hyPERtension and a metabolic acidosis. Pt admitted to SDU per [facility] and PCCM asked to consult.    Pt does have a RLE wound for past few weeks after an injury working in the yard for which he has taken doxycycline.  Denies purulence or worsening of the wound.  Denies dysuria, hemoptysis, fevers, chills, syncope, leg/calf pain.   PAST MEDICAL HISTORY :   has a past medical history of Coronary atherosclerosis of native coronary artery; Essential hypertension, benign; Type 2 diabetes mellitus; Dyslipidemia; ACE inhibitor intolerance; Asthma; Recurrent pulmonary embolism; Pulmonary nodule; Cholelithiasis; Pulmonary HTN; Lupus anticoagulant positive; GERD (gastroesophageal reflux disease); COPD (chronic obstructive pulmonary disease); and OSA (obstructive sleep apnea).  has past surgical history that includes Coronary artery bypass graft (2005 ); L4-L5  laminectomy; Colonoscopy; Esophagogastroduodenoscopy (N/A, 12/19/2013); and kidney stone removed. Prior to Admission medications   Medication Sig Start Date End Date Taking? Authorizing Provider  acetaminophen (TYLENOL) 325 MG tablet Take 650 mg by mouth every 4 (four) hours as needed for mild pain. For pain   Yes Historical Provider, MD  albuterol (PROAIR HFA) 108 (90 BASE) MCG/ACT inhaler Inhale 2 puffs into the lungs every 6 (six) hours as needed for wheezing or shortness of breath.  02/11/14 02/11/15 Yes Historical Provider, MD  atorvastatin (LIPITOR) 40 MG tablet Take 40 mg by mouth daily at 6 PM.    Yes Historical Provider, MD  chlorthalidone (HYGROTON) 25 MG tablet TAKE 1 TABLET BY MOUTH DAILY 12/09/14  Yes Satira Sark, MD  doxycycline (VIBRAMYCIN) 50 MG capsule Take 2 capsules (100 mg total) by mouth daily. Patient taking differently: Take 100 mg by mouth 2 (two) times daily.  11/16/14  Yes Juanito Doom, MD  esomeprazole (NEXIUM) 40 MG capsule Take 40 mg by mouth daily before breakfast.   Yes Historical Provider, MD  fish oil-omega-3 fatty acids 1000 MG capsule Take 2 g by mouth daily.    Yes Historical Provider, MD  fluticasone (FLONASE) 50 MCG/ACT nasal spray Place 2 sprays into the nose daily.     Yes Historical Provider, MD  Fluticasone Furoate-Vilanterol (BREO ELLIPTA) 100-25 MCG/INH AEPB Inhale 1 puff into the lungs daily. 01/26/15  Yes Historical Provider, MD  HYDROcodone-acetaminophen (NORCO) 7.5-325 MG per tablet Take by mouth every 6 (six) hours as needed for moderate pain.  02/17/14  Yes Historical Provider, MD  isosorbide mononitrate (IMDUR) 60 MG 24 hr tablet TAKE 3 TABLETS BY  MOUTH ONCE DAILY 08/12/14  Yes Satira Sark, MD  losartan (COZAAR) 100 MG tablet Take 1 tablet (100 mg total) by mouth daily. 01/18/15  Yes Satira Sark, MD  metFORMIN (GLUCOPHAGE) 500 MG tablet Take 1,000 mg by mouth 2 (two) times daily with a meal.    Yes Historical Provider, MD  metoprolol  (LOPRESSOR) 50 MG tablet TAKE 1 AND 1/2 TABLETS BY MOUTH TWICE DAILY 10/17/14  Yes Satira Sark, MD  predniSONE (DELTASONE) 10 MG tablet Take 10 mg by mouth daily with breakfast.  01/26/15  Yes Historical Provider, MD  tiotropium (SPIRIVA) 18 MCG inhalation capsule Place 1 capsule (18 mcg total) into inhaler and inhale daily. 11/16/14  Yes Juanito Doom, MD  verapamil (CALAN-SR) 240 MG CR tablet TAKE 1 TABLET (240 MG TOTAL) BY MOUTH DAILY. 05/20/14  Yes Satira Sark, MD  warfarin (COUMADIN) 5 MG tablet Take 5 mg by mouth as directed. MANAGED BY PMD, 2.5 mg on Monday.  5 mg Tues, Thurs, Friday, Sunday.  Skip Wednesday and Saturday.   Yes Historical Provider, MD  zolpidem (AMBIEN) 10 MG tablet Take 10 mg by mouth at bedtime. 01/30/15  Yes Historical Provider, MD  nitroGLYCERIN (NITROSTAT) 0.4 MG SL tablet Place 1 tablet (0.4 mg total) under the tongue every 5 (five) minutes x 3 doses as needed. For chest pain 04/05/14   Satira Sark, MD   Allergies  Allergen Reactions  . Ace Inhibitors     Cough    FAMILY HISTORY:  family history includes Breast cancer in his sister; Heart disease in his brother, father, and sister. SOCIAL HISTORY:  reports that he quit smoking about 42 years ago. His smoking use included Cigarettes. He started smoking about 69 years ago. He has a 9 pack-year smoking history. His smokeless tobacco use includes Chew. He reports that he does not drink alcohol or use illicit drugs.  REVIEW OF SYSTEMS:   As per HPI - All other systems reviewed and were neg.    SUBJECTIVE:   VITAL SIGNS: Temp:  [97.9 F (36.6 C)-99 F (37.2 C)] 98.5 F (36.9 C) (07/24 2257) Pulse Rate:  [81-111] 102 (07/24 2157) Resp:  [15-29] 25 (07/24 2125) BP: (116-180)/(54-93) 176/88 mmHg (07/24 2157) SpO2:  [94 %-99 %] 99 % (07/24 2157) Weight:  [185 lb (83.915 kg)-188 lb 0.8 oz (85.3 kg)] 188 lb 0.8 oz (85.3 kg) (07/24 2157)  PHYSICAL EXAMINATION: General:  Very pleasant, chronically  ill appearing male, NAD  Neuro:  Awake, alert, appropriate, MAE  HEENT:  Mm  Moist, no JVD  Cardiovascular:  s1s2 rrr Lungs:  resps even, non labored, harsh cough, coarse rhonchi  Abdomen:  Round, mildly distended, non tender Musculoskeletal:  Warm and dry, no edema, RLE wound, covered, c/d, no surrounding erythema    Recent Labs Lab 02/05/15 1735  NA 137  K 4.4  CL 101  CO2 18*  BUN 31*  CREATININE 1.39*  GLUCOSE 175*    Recent Labs Lab 02/05/15 1735  HGB 14.1  HCT 43.0  WBC 15.4*  PLT 330   Dg Chest Port 1 View  02/05/2015   CLINICAL DATA:  Cough and dyspnea.  EXAM: PORTABLE CHEST - 1 VIEW  COMPARISON:  03/15/2014  FINDINGS: There is prior sternotomy and CABG. Heart size is normal, unchanged. The lungs are clear. The pulmonary vasculature is normal. There is mild unchanged right hemidiaphragm elevation. There is no large effusion.  IMPRESSION: No acute cardiopulmonary findings.   Electronically Signed  By: Andreas Newport M.D.   On: 02/05/2015 17:48   Dg Tibia/fibula Right Port  02/05/2015   CLINICAL DATA:  Wound on the lateral side of the right lower leg.  EXAM: PORTABLE RIGHT TIBIA AND FIBULA - 2 VIEW  COMPARISON:  None.  FINDINGS: Negative for fracture, dislocation or radiopaque foreign body. There is no bone lesion or bony destruction.  IMPRESSION: Negative for significant abnormality.   Electronically Signed   By: Andreas Newport M.D.   On: 02/05/2015 20:12    ASSESSMENT / PLAN:  ??AECOPD - mild.  Chest pain - with EKG changes.  Neg troponin.  Also worsened by persistent cough.  Rising lactate --unclear etiology of elevated and still rising lactate.  He is on metformin.  Mildly elevated WBC but no clear source other than leg wound which has been treated and does not look cellulitic. Does not clinically appear hypoperfused or septic.  Leg wound   Rec-  Cont BD, abx and steroids for AECOPD  Cardiology following for chest pain  Trend troponin  Cont nitro per  cards  F/u lactate again - if continues to rise will need imaging chest/abd  Pan culture  Repeat EKG  Check u/a, salicylate level, LFT's Cough suppression    Updated pt and wife at bedside.    Nickolas Madrid, NP 02/05/2015  11:27 PM Pager: [phone] or [phone]

## 2015-02-05 NOTE — Consult Note (Addendum)
Cardiology Consult Note  Admit date: 02/05/2015 Name: Henry [name] 74 y.o.  male DOB:  11-02-1940 MRN:  [id number]  Today's date:  02/05/2015  Referring Physician:    [facility] Dr. Pilar Grammes  Primary Physician:    Dr. Barnet Glasgow  Reason for Consultation:    Chest pain   IMPRESSIONS: 1.  Chest discomfort that may be angina associated with ST depression-he has known occlusion of his 2 vein grafts to the right coronary artery and circumflex that are unbypassed and not suitable vessels for re-vascularization.  He may have demand type angina due to his COPD exacerbation 2.  COPD exacerbation 3.  Elevation of lactate that may be early sepsis 4.  Leg ulcer of recent onset 5.  Diabetes mellitus 6.  Stage III chronic kidney disease 7.  History of recurrent pulmonary emboli-one would wonder whether this could be causative in this setting also. 8.  Incomplete database-there is basically no labs back right now he could be anemic or have any other reasons for his chest pain.  RECOMMENDATION: There is not a lot to be done in terms of the chest pain other than medical treatment.  I would place him on IV nitroglycerin since his blood pressure is elevated and continue to treat the COPD exacerbation.  If the COPD exacerbation tolerate beta blockers rate slowing could help him.  I don't think that the elevated lactic acid is cardiac in nature.  He clinically does not look like he is hypoperfused and has a good blood pressure and there is no evidence of hypoperfusion on his extremities.  I would start IV nitroglycerin and check a repeat echo in the morning.  Echocardiogram year ago showed normal LV function.  I would cycle his cardiac enzymes.  He is already anticoagulated and don't see a role for heparin at this point.  HISTORY: This 74 year old male was brought in for COPD exacerbation.  Patient has history of coronary artery disease and had bypass grafting in 2005.  He later occluded his  vein grafts and had stenting of the circumflex.  Subsequent catheterization in 2012 showed severe native circumflex disease involving the very distal portions with small caliber vessels and Dr. Lia Foyer at the time did not feel that he would be able to have a good angiographic result from stenting and continued medical therapy was recommended.  The native right coronary artery was occluded and the mammary was patent.  About 7 weeks ago he developed a cut on his leg after working out in the field and cut his leg on a pine cone and then it subsequently developed some drainage.  He was seen by [place] primary doctor and treated with anti-biotics as well as steroids.  He has developed progressive shortness of breath cough and presented to the emergency room with worsening shortness of breath and coughing and a significant COPD exacerbation.  He was found to have a lactic acidosis and a is currently being treated for a COPD exacerbation.  His lactate is now climbing and his white count is elevated.  While in the emergency room he developed fairly severe chest pain with radiation down his left arm and was given some nitroglycerin.  He was worsening of ST depression on his EKG.  He looks fairly comfortable now but states he has about 4 out of 10 chest pain.  Past Medical History  Diagnosis Date  . Coronary atherosclerosis of native coronary artery     a. NSTEMI 2005 s/p CABG (LIMA - LAD,  SVG - OM, SVG - PDA). b. DES to Cx 02/2005. c. NSTEMI 07/2010 due to occ Cx - med rx. Known occluded vein grafts.  . Essential hypertension, benign   . Type 2 diabetes mellitus   . Dyslipidemia   . ACE inhibitor intolerance   . Asthma   . Recurrent pulmonary embolism     a. 01/2011. b. Bilateral PE 11/2011 - + lupus anticoagulant preliminary testing. Felt to require lifelong Coumadin.  . Pulmonary nodule     RML by CT 11/2011, stable compared to 2012.  Marland Kitchen Cholelithiasis   . Pulmonary HTN     Echo 11/2011 at time of PE. PA pressure  not assessed on 03/2012 echo but RV was back to normal.  . Lupus anticoagulant positive   . GERD (gastroesophageal reflux disease)   . COPD (chronic obstructive pulmonary disease)   . OSA (obstructive sleep apnea)       Past Surgical History  Procedure Laterality Date  . Coronary artery bypass graft  2005     LIMA to LAD, SVG to OM, SVG to RCA  . L4-l5 laminectomy    . Colonoscopy    . Esophagogastroduodenoscopy N/A 12/19/2013    Procedure: ESOPHAGOGASTRODUODENOSCOPY (EGD);  Surgeon: Lafayette Dragon, MD;  [facility] ENDOSCOPY;  Service: Endoscopy;  Laterality: N/A;  . Kidney stone removed       Allergies:  is allergic to ace inhibitors.   Medications: Prior to Admission medications   Medication Sig Start Date End Date Taking? Authorizing Provider  acetaminophen (TYLENOL) 325 MG tablet Take 650 mg by mouth every 4 (four) hours as needed for mild pain. For pain   Yes Historical Provider, MD  albuterol (PROAIR HFA) 108 (90 BASE) MCG/ACT inhaler Inhale 2 puffs into the lungs every 6 (six) hours as needed for wheezing or shortness of breath.  02/11/14 02/11/15 Yes Historical Provider, MD  atorvastatin (LIPITOR) 40 MG tablet Take 40 mg by mouth daily at 6 PM.    Yes Historical Provider, MD  chlorthalidone (HYGROTON) 25 MG tablet TAKE 1 TABLET BY MOUTH DAILY 12/09/14  Yes Satira Sark, MD  doxycycline (VIBRAMYCIN) 50 MG capsule Take 2 capsules (100 mg total) by mouth daily. Patient taking differently: Take 100 mg by mouth 2 (two) times daily.  11/16/14  Yes Juanito Doom, MD  esomeprazole (NEXIUM) 40 MG capsule Take 40 mg by mouth daily before breakfast.   Yes Historical Provider, MD  fish oil-omega-3 fatty acids 1000 MG capsule Take 2 g by mouth daily.    Yes Historical Provider, MD  fluticasone (FLONASE) 50 MCG/ACT nasal spray Place 2 sprays into the nose daily.     Yes Historical Provider, MD  Fluticasone Furoate-Vilanterol (BREO ELLIPTA) 100-25 MCG/INH AEPB Inhale 1 puff into the lungs  daily. 01/26/15  Yes Historical Provider, MD  HYDROcodone-acetaminophen (NORCO) 7.5-325 MG per tablet Take by mouth every 6 (six) hours as needed for moderate pain.  02/17/14  Yes Historical Provider, MD  isosorbide mononitrate (IMDUR) 60 MG 24 hr tablet TAKE 3 TABLETS BY MOUTH ONCE DAILY 08/12/14  Yes Satira Sark, MD  losartan (COZAAR) 100 MG tablet Take 1 tablet (100 mg total) by mouth daily. 01/18/15  Yes Satira Sark, MD  metFORMIN (GLUCOPHAGE) 500 MG tablet Take 1,000 mg by mouth 2 (two) times daily with a meal.    Yes Historical Provider, MD  metoprolol (LOPRESSOR) 50 MG tablet TAKE 1 AND 1/2 TABLETS BY MOUTH TWICE DAILY 10/17/14  Yes Aloha Gell  Domenic Polite, MD  predniSONE (DELTASONE) 10 MG tablet Take 10 mg by mouth daily with breakfast.  01/26/15  Yes Historical Provider, MD  tiotropium (SPIRIVA) 18 MCG inhalation capsule Place 1 capsule (18 mcg total) into inhaler and inhale daily. 11/16/14  Yes Juanito Doom, MD  verapamil (CALAN-SR) 240 MG CR tablet TAKE 1 TABLET (240 MG TOTAL) BY MOUTH DAILY. 05/20/14  Yes Satira Sark, MD  warfarin (COUMADIN) 5 MG tablet Take 5 mg by mouth as directed. MANAGED BY PMD, 2.5 mg on Monday.  5 mg Tues, Thurs, Friday, Sunday.  Skip Wednesday and Saturday.   Yes Historical Provider, MD  zolpidem (AMBIEN) 10 MG tablet Take 10 mg by mouth at bedtime. 01/30/15  Yes Historical Provider, MD  nitroGLYCERIN (NITROSTAT) 0.4 MG SL tablet Place 1 tablet (0.4 mg total) under the tongue every 5 (five) minutes x 3 doses as needed. For chest pain 04/05/14   Satira Sark, MD    Family History: No family status information on file.    Social History:   reports that he quit smoking about 42 years ago. His smoking use included Cigarettes. He started smoking about 69 years ago. He has a 9 pack-year smoking history. His smokeless tobacco use includes Chew. He reports that he does not drink alcohol or use illicit drugs.   History   Social History Narrative   Married,  retired.     Review of Systems: Significant shortness of breath, leg injury is noted.  Mild edema.  Some angina.  He has a prior history of GI bleeding recently had 3 polisher removed from his stomach.  Physical Exam: BP 176/88 mmHg  Pulse 102  Temp(Src) 97.9 F (36.6 C) (Oral)  Resp 25  Ht 5\' 8"  (1.727 m)  Wt 85.3 kg (188 lb 0.8 oz)  BMI 28.60 kg/m2  SpO2 99%  General appearance: Talkative bearded white male who is actively coughing and short of breath Head: Normocephalic, without obvious abnormality, atraumatic Eyes: conjunctivae/corneas clear. PERRL, EOM's intact. Fundi benign. Neck: no adenopathy, no carotid bruit, supple, symmetrical, trachea midline and JVD difficult to assess Lungs: Increased AP diameter, bilateral inspiratory and tarry wheezes and rhonchi Chest wall: Healed median sternotomy scar Heart: Somewhat distant heart sounds, normal S1 and S2, no definite murmur heard but difficult to hear over COPD Abdomen: soft, non-tender; bowel sounds normal; no masses,  no organomegaly Rectal: deferred Extremities: There is trace edema, there is a 2 x 3 cm eschar over the right lower leg laterally not.  Lungs Pulses: 2+ and symmetric Skin: Skin color, texture, turgor normal. No rashes or lesions Neurologic: Grossly normal  Labs: CBC  Recent Labs  02/05/15 1735  WBC 15.4*  RBC 4.76  HGB 14.1  HCT 43.0  PLT 330  MCV 90.3  MCH 29.6  MCHC 32.8  RDW 14.6   CMP   Recent Labs  02/05/15 1735  NA 137  K 4.4  CL 101  CO2 18*  GLUCOSE 175*  BUN 31*  CREATININE 1.39*  CALCIUM 9.1  GFRNONAA 48*  GFRAA 56*   BNP (last 3 results)    Component Value Date/Time   BNP 120.1* 02/05/2015 1735   Cardiac Panel (last 3 results) Cardiac Panel (last 3 results)  Recent Labs  02/05/15 2113  TROPONINI 0.03     Radiology: Prior sternotomy, clear lungs, COPD  EKG: Sinus rhythm with ST depression inferior and laterally that is worsened somewhat on subsequent  EKGs  Signed:  W. Tollie Eth, Brooke Bonito.  MD [facility]   Cardiology Consultant  02/05/2015, 10:26 PM

## 2015-02-05 NOTE — ED Notes (Signed)
Dr Regenia Skeeter given a copy of lactic acid results 10.76

## 2015-02-05 NOTE — H&P (Addendum)
History and Physical    Henry [name] OJJ:[id number] DOB: 04-Apr-1941 DOA: 02/05/2015  Referring physician: Dr. Regenia Skeeter PCP: Sherrie Mustache, MD  Specialists: none  Chief Complaint: shortness of breath   HPI: Henry [name] is a 74 y.o. male has a past medical history significant for coronary artery disease status post CABG, hypertension, hyperlipidemia, type 2 diabetes, recurrent pulmonary embolism on chronic anticoagulation, lupus anticoagulate, COPD, presents to the emergency room with a chief complaint shortness of breath progressively getting worse over the last week. He endorses "chest congestion" as well as progressively worsening cough. He endorses yellowish sputum production which is also getting worse. He endorses chest pain with coughing. He endorses using his albuterol more often in the last few days. He denies any palpitations, denies any abdominal pain, nausea vomiting or diarrhea. He denies any blood in his stool or dark tarry stools. He has a history of a GI bleed in 2015 and at that time he was taken briefly off of anticoagulation now back on Coumadin, and had no issues since. He denies any fever or chills. He denies any dysuria, he denies any lightheadedness or dizziness. In the emergency room, he is afebrile, his blood pressure is normal to high side, and he is satting well on 2 L nasal cannula. His blood work showed mild AKI, and a surprisingly elevated lactic acid to 8.7. An ABG and repeat lactic acid is pending. Chest x-ray was without evidence of pneumonia or edema. Of note, his right lower extremity wound for the past few weeks, this appeared after a small injury in his yard, and his been on doxycycline for a while.  He was given stairways, levofloxacin as well as a door and treatment, and [facility] was asked for admission for COPD exacerbation  Review of Systems: as per HPI otherwise 10 point ROS negative  Past Medical History  Diagnosis Date  . Coronary atherosclerosis of  native coronary artery     a. NSTEMI 2005 s/p CABG (LIMA - LAD, SVG - OM, SVG - PDA). b. DES to Cx 02/2005. c. NSTEMI 07/2010 due to occ Cx - med rx. Known occluded vein grafts.  . Essential hypertension, benign   . Type 2 diabetes mellitus   . Dyslipidemia   . ACE inhibitor intolerance   . Asthma   . Recurrent pulmonary embolism     a. 01/2011. b. Bilateral PE 11/2011 - + lupus anticoagulant preliminary testing. Felt to require lifelong Coumadin.  . Pulmonary nodule     RML by CT 11/2011, stable compared to 2012.  Marland Kitchen Cholelithiasis   . Pulmonary HTN     Echo 11/2011 at time of PE. PA pressure not assessed on 03/2012 echo but RV was back to normal.  . Lupus anticoagulant positive   . GERD (gastroesophageal reflux disease)   . COPD (chronic obstructive pulmonary disease)   . OSA (obstructive sleep apnea)    Past Surgical History  Procedure Laterality Date  . Coronary artery bypass graft  2005     LIMA to LAD, SVG to OM, SVG to RCA  . L4-l5 laminectomy    . Colonoscopy    . Esophagogastroduodenoscopy N/A 12/19/2013    Procedure: ESOPHAGOGASTRODUODENOSCOPY (EGD);  Surgeon: Lafayette Dragon, MD;  Location: [facility] ENDOSCOPY;  Service: Endoscopy;  Laterality: N/A;  . Kidney stone removed     Social History:  reports that he quit smoking about 42 years ago. His smoking use included Cigarettes. He started smoking about 69 years ago. He has  a 9 pack-year smoking history. His smokeless tobacco use includes Chew. He reports that he does not drink alcohol or use illicit drugs.  Allergies  Allergen Reactions  . Ace Inhibitors     Cough    Family History  Problem Relation Age of Onset  . Heart disease Father   . Heart disease Sister   . Breast cancer Sister   . Heart disease Brother     Prior to Admission medications   Medication Sig Start Date End Date Taking? Authorizing Provider  Fluticasone Furoate-Vilanterol (BREO ELLIPTA) 100-25 MCG/INH AEPB Inhale 1 puff into the lungs daily. 01/26/15  Yes  Historical Provider, MD  acetaminophen (TYLENOL) 325 MG tablet Take 650 mg by mouth every 4 (four) hours as needed. For pain    Historical Provider, MD  albuterol (PROAIR HFA) 108 (90 BASE) MCG/ACT inhaler Inhale 2 puffs into the lungs as needed. 02/11/14 02/11/15  Historical Provider, MD  atorvastatin (LIPITOR) 40 MG tablet Take 80 mg by mouth daily.     Historical Provider, MD  atorvastatin (LIPITOR) 80 MG tablet Take 80 mg by mouth daily. 11/02/14   Historical Provider, MD  chlorthalidone (HYGROTON) 25 MG tablet TAKE 1 TABLET BY MOUTH DAILY 12/09/14   Satira Sark, MD  Coenzyme Q10 (CO Q 10) 10 MG CAPS Take 200 mg by mouth daily.    Historical Provider, MD  doxycycline (VIBRA-TABS) 100 MG tablet Take 100 mg by mouth 2 (two) times daily. 01/26/15   Historical Provider, MD  doxycycline (VIBRA-TABS) 100 MG tablet Take 100 mg by mouth daily.    Historical Provider, MD  doxycycline (VIBRAMYCIN) 50 MG capsule Take 2 capsules (100 mg total) by mouth daily. 11/16/14   Juanito Doom, MD  esomeprazole (NEXIUM) 40 MG capsule Take 40 mg by mouth daily before breakfast.    Historical Provider, MD  fish oil-omega-3 fatty acids 1000 MG capsule Take 2 g by mouth daily.     Historical Provider, MD  fluticasone (FLONASE) 50 MCG/ACT nasal spray Place 2 sprays into the nose daily.      Historical Provider, MD  Fluticasone Furoate-Vilanterol (BREO ELLIPTA) 100-25 MCG/INH AEPB Inhale 1 puff into the lungs as needed.    Historical Provider, MD  HYDROcodone-acetaminophen (NORCO) 7.5-325 MG per tablet 1 tablet twice daily as needed 02/17/14   Historical Provider, MD  isosorbide mononitrate (IMDUR) 60 MG 24 hr tablet TAKE 3 TABLETS BY MOUTH ONCE DAILY 08/12/14   Satira Sark, MD  losartan (COZAAR) 100 MG tablet Take 1 tablet (100 mg total) by mouth daily. 01/18/15   Satira Sark, MD  metFORMIN (GLUCOPHAGE) 500 MG tablet Take 1,000 mg by mouth 2 (two) times daily with a meal.     Historical Provider, MD  metFORMIN  (GLUCOPHAGE-XR) 500 MG 24 hr tablet Take 1,000 mg by mouth 2 (two) times daily. 01/30/15   Historical Provider, MD  metoprolol (LOPRESSOR) 50 MG tablet TAKE 1 AND 1/2 TABLETS BY MOUTH TWICE DAILY 10/17/14   Satira Sark, MD  nitroGLYCERIN (NITROSTAT) 0.4 MG SL tablet Place 1 tablet (0.4 mg total) under the tongue every 5 (five) minutes x 3 doses as needed. For chest pain 04/05/14   Satira Sark, MD  omeprazole (PRILOSEC) 40 MG capsule Take 40 mg by mouth daily. 01/14/15   Historical Provider, MD  predniSONE (DELTASONE) 10 MG tablet Take 10 mg by mouth. 01/26/15   Historical Provider, MD  tiotropium (SPIRIVA) 18 MCG inhalation capsule Place 1 capsule (18 mcg  total) into inhaler and inhale daily. 11/16/14   Juanito Doom, MD  verapamil (CALAN-SR) 240 MG CR tablet TAKE 1 TABLET (240 MG TOTAL) BY MOUTH DAILY. 05/20/14   Satira Sark, MD  warfarin (COUMADIN) 5 MG tablet Take 5 mg by mouth as directed. MANAGED BY PMD    Historical Provider, MD  zolpidem (AMBIEN) 10 MG tablet Take 10 mg by mouth at bedtime. 01/30/15   Historical Provider, MD  zolpidem (AMBIEN) 5 MG tablet Take 5 mg by mouth at bedtime.     Historical Provider, MD   Physical Exam: Filed Vitals:   02/05/15 1730 02/05/15 1744 02/05/15 1745 02/05/15 1815  BP: 164/69  130/58 132/54  Pulse: 103  81 91  Temp:      TempSrc:      Resp: 27  20 20   Height:      Weight:      SpO2: 96% 98% 96% 96%     General:  No apparent distress, flushed face  Eyes: PERRL, no scleral icterus  ENT: moist oropharynx, erythematous oropharynx  Neck: supple, no lymphadenopathy  Cardiovascular: regular rate without MRG; 2+ peripheral pulses, no JVD, no peripheral edema  Respiratory: dood air movement bilaterally, wheezing evident in bilateral lung fields, no crackles  Abdomen: soft, non tender to palpation, positive bowel sounds, no guarding, no rebound  Skin: no rashes  Musculoskeletal: normal bulk and tone, no joint swelling; right lower  extremity 1 x 1" round crusted wound, without drainage   Psychiatric: normal mood and affect  Neurologic: CN 2-12 grossly intact, MS 5/5 in all 4  Labs on Admission:  Basic Metabolic Panel:  Recent Labs Lab 02/05/15 1735  NA 137  K 4.4  CL 101  CO2 18*  GLUCOSE 175*  BUN 31*  CREATININE 1.39*  CALCIUM 9.1   CBC:  Recent Labs Lab 02/05/15 1735  WBC 15.4*  HGB 14.1  HCT 43.0  MCV 90.3  PLT 330   BNP (last 3 results)  Recent Labs  02/05/15 1735  BNP 120.1*    Radiological Exams on Admission: Dg Chest Port 1 View  02/05/2015   CLINICAL DATA:  Cough and dyspnea.  EXAM: PORTABLE CHEST - 1 VIEW  COMPARISON:  03/15/2014  FINDINGS: There is prior sternotomy and CABG. Heart size is normal, unchanged. The lungs are clear. The pulmonary vasculature is normal. There is mild unchanged right hemidiaphragm elevation. There is no large effusion.  IMPRESSION: No acute cardiopulmonary findings.   Electronically Signed   By: Andreas Newport M.D.   On: 02/05/2015 17:48    EKG: Independently reviewed.Sinus rhythm, nonspecific ST depression lateral leads   Assessment/Plan Principal Problem:   COPD exacerbation Active Problems:   Essential hypertension, benign   Dyslipidemia   Long term current use of anticoagulant therapy   Recurrent pulmonary embolism   Type 2 diabetes mellitus   Coronary atherosclerosis of native coronary artery   CKD (chronic kidney disease), stage II   AKI (acute kidney injury)   Elevated lactic acid level   COPD exacerbation  - Patient with clinical progressive shortness of breath and increased sputum production over the last week.  - IV steroid-induced with Solu-Medrol, IV levofloxacin, Atrovent/Xopenex every 6 hours  - Closely monitor respiratory status   Acute hypoxic respiratory failure - Oxygen as needed, this is likely due to #1, ABGs pending - The stable on 2 L nasal cannula - We'll admit to step down unit for closer observation  overnight  Elevated lactic acid -  This is not entirely clear as to why his lactic acid is so high, he is not septic, he is not hypotensive - Possibly related to metformin, will hold for now - Repeat lactic acid after hydration - rarely can be induced by albuterol  Acute kidney injury on chronic kidney disease stage II - Very mild elevation of his creatinine, very close to baseline - Repeat BMP in the morning  Hypertension - Resume his home medications  Coronary artery disease / chest pain - His chest pain appeared after a few days of coughing and its worse when he coughs - Cycle cardiac enzymes given extensive history of coronary artery disease  History of recurrent pulmonary embolism - Coumadin per pharmacy - Closely monitor, has history of a GI bleed in 2015  Hyperlipidemia - Resume his home statin  Type 2 diabetes mellitus - Most recent hemoglobin A1c in 2015 showed good control - Hold metformin as above, place patient on sliding scale insulin  Right lower extremity wound - Without any active evidence of infection, he is on levofloxacin for #1, will hold doxycycline for now, resume as an outpatient   Diet: heart healthy / carb modified Fluids: NS DVT Prophylaxis: on Coumadin  Code Status: Full  Family Communication: d/w wife bedside  Disposition Plan: admit to SDU  Time spent: 51  Navreet Bolda M. Cruzita Lederer, MD [facility] Pager [phone]  If 7PM-7AM, please contact night-coverage [email or web address] Password [email or web address] 02/05/2015, 8:00 PM

## 2015-02-05 NOTE — ED Notes (Signed)
Pt reports a productive cough of white sputum x1 week.  Pt reports the cough worsened last night.  Pt has hx of Pneumonia.  Pt is also being treated by PMD for wound to right lower leg with ABX and steroids.

## 2015-02-05 NOTE — Progress Notes (Addendum)
ANTICOAGULATION CONSULT NOTE - Initial Consult  Pharmacy Consult for Warfarin Indication: Hx recurrent PE  Allergies  Allergen Reactions  . Ace Inhibitors     Cough    Patient Measurements: Height: 5\' 8"  (172.7 cm) Weight: 185 lb (83.915 kg) IBW/kg (Calculated) : 68.4  Vital Signs: Temp: 99 F (37.2 C) (07/24 1716) Temp Source: Oral (07/24 1716) BP: 180/86 mmHg (07/24 2015) Pulse Rate: 111 (07/24 2015)  Labs:  Recent Labs  02/05/15 1735  HGB 14.1  HCT 43.0  PLT 330  LABPROT 30.7*  INR 3.01*  CREATININE 1.39*    Estimated Creatinine Clearance: 48.5 mL/min (by C-G formula based on Cr of 1.39).   Medical History: Past Medical History  Diagnosis Date  . Coronary atherosclerosis of native coronary artery     a. NSTEMI 2005 s/p CABG (LIMA - LAD, SVG - OM, SVG - PDA). b. DES to Cx 02/2005. c. NSTEMI 07/2010 due to occ Cx - med rx. Known occluded vein grafts.  . Essential hypertension, benign   . Type 2 diabetes mellitus   . Dyslipidemia   . ACE inhibitor intolerance   . Asthma   . Recurrent pulmonary embolism     a. 01/2011. b. Bilateral PE 11/2011 - + lupus anticoagulant preliminary testing. Felt to require lifelong Coumadin.  . Pulmonary nodule     RML by CT 11/2011, stable compared to 2012.  Marland Kitchen Cholelithiasis   . Pulmonary HTN     Echo 11/2011 at time of PE. PA pressure not assessed on 03/2012 echo but RV was back to normal.  . Lupus anticoagulant positive   . GERD (gastroesophageal reflux disease)   . COPD (chronic obstructive pulmonary disease)   . OSA (obstructive sleep apnea)     Assessment: 29 YOM who presented to the [facility] on 7/24 with SOB/cough concerning for PNA. The patient was on warfarin PTA for hx recurrent PE and admitted with a therapeutic INR of 3.06 on PTA dose of 2.5 mg on Mon, 5 mg on T/Th/Fri/Sun, none on Wed/Sat.  Noted hx of GIB in '15 and taken off of warfarin briefly, then restarted - no issues since restarted.   The patient was on Doxy PTA  for a RLE wound and also po steroids - both of which could increase sensitivity. This admission, the patient was transitioned to Levaquin and solu-medrol which will also increase warfarin sensitivity. The patient has already taken his dose today. Will trend daily INRs and address additional doses in the AM.   Goal of Therapy:  INR 2-3   Plan:  1. No warfarin tonight (has already taken dose today) 2. Daily PT/INR 3. Will continue to monitor for any signs/symptoms of bleeding and will follow up with PT/INR in the a.m.   Alycia Rossetti, PharmD, BCPS Clinical Pharmacist Pager: [phone] 02/05/2015 8:35 PM

## 2015-02-05 NOTE — ED Provider Notes (Addendum)
CSN: [id number]     Arrival date & time 02/05/15  1711 History   First MD Initiated Contact with Patient 02/05/15 1711     Chief Complaint  Patient presents with  . Cough     (Consider location/radiation/quality/duration/timing/severity/associated sxs/prior Treatment) HPI  74 year old male presents with cough and dyspnea. For the past 1 week he's been having a mild cough with chest congestion. No fevers. Over last 24 hours his cough and shortness of breath of significantly worsened. He's been trying Robitussin for the cough with no significant relief. He has a history of COPD, multiple episodes of pneumonia, and pulmonary hypertension. Patient has notany leg swelling. He has noticed chest pressure that seems to be worse with coughing. He states he has had this chest pressure before when coughing but also before when having heart disease. Patient states he has a mild level of chest pressure right now. Currently on antibiotics for a 7 week leg wound on the right leg.  Past Medical History  Diagnosis Date  . Coronary atherosclerosis of native coronary artery     a. NSTEMI 2005 s/p CABG (LIMA - LAD, SVG - OM, SVG - PDA). b. DES to Cx 02/2005. c. NSTEMI 07/2010 due to occ Cx - med rx. Known occluded vein grafts.  . Essential hypertension, benign   . Type 2 diabetes mellitus   . Dyslipidemia   . ACE inhibitor intolerance   . Asthma   . Recurrent pulmonary embolism     a. 01/2011. b. Bilateral PE 11/2011 - + lupus anticoagulant preliminary testing. Felt to require lifelong Coumadin.  . Pulmonary nodule     RML by CT 11/2011, stable compared to 2012.  Marland Kitchen Cholelithiasis   . Pulmonary HTN     Echo 11/2011 at time of PE. PA pressure not assessed on 03/2012 echo but RV was back to normal.  . Lupus anticoagulant positive   . GERD (gastroesophageal reflux disease)   . COPD (chronic obstructive pulmonary disease)   . OSA (obstructive sleep apnea)    Past Surgical History  Procedure Laterality Date  .  Coronary artery bypass graft  2005     LIMA to LAD, SVG to OM, SVG to RCA  . L4-l5 laminectomy    . Colonoscopy    . Esophagogastroduodenoscopy N/A 12/19/2013    Procedure: ESOPHAGOGASTRODUODENOSCOPY (EGD);  Surgeon: Lafayette Dragon, MD;  Location[facility] ENDOSCOPY;  Service: Endoscopy;  Laterality: N/A;  . Kidney stone removed     Family History  Problem Relation Age of Onset  . Heart disease Father   . Heart disease Sister   . Breast cancer Sister   . Heart disease Brother    History  Substance Use Topics  . Smoking status: Former Smoker -- 0.30 packs/day for 30 years    Types: Cigarettes    Start date: 09/28/1945    Quit date: 07/15/1972  . Smokeless tobacco: Current User    Types: Chew     Comment: chews 1/2 pack tobacco per day  . Alcohol Use: No    Review of Systems  Constitutional: Negative for fever.  Respiratory: Positive for cough and shortness of breath.   Cardiovascular: Positive for chest pain. Negative for leg swelling.  All other systems reviewed and are negative.     Allergies  Ace inhibitors  Home Medications   Prior to Admission medications   Medication Sig Start Date End Date Taking? Authorizing Provider  acetaminophen (TYLENOL) 325 MG tablet Take 650 mg by mouth every 4 (  four) hours as needed. For pain    Historical Provider, MD  albuterol (PROAIR HFA) 108 (90 BASE) MCG/ACT inhaler Inhale 2 puffs into the lungs as needed. 02/11/14 02/11/15  Historical Provider, MD  atorvastatin (LIPITOR) 40 MG tablet Take 80 mg by mouth daily.     Historical Provider, MD  chlorthalidone (HYGROTON) 25 MG tablet TAKE 1 TABLET BY MOUTH DAILY 12/09/14   Satira Sark, MD  Coenzyme Q10 (CO Q 10) 10 MG CAPS Take 200 mg by mouth daily.    Historical Provider, MD  doxycycline (VIBRAMYCIN) 50 MG capsule Take 2 capsules (100 mg total) by mouth daily. 11/16/14   Juanito Doom, MD  esomeprazole (NEXIUM) 40 MG capsule Take 40 mg by mouth daily before breakfast.    Historical  Provider, MD  fish oil-omega-3 fatty acids 1000 MG capsule Take 2 g by mouth daily.     Historical Provider, MD  fluticasone (FLONASE) 50 MCG/ACT nasal spray Place 2 sprays into the nose daily.      Historical Provider, MD  Fluticasone Furoate-Vilanterol (BREO ELLIPTA) 100-25 MCG/INH AEPB Inhale 1 puff into the lungs as needed.    Historical Provider, MD  HYDROcodone-acetaminophen (NORCO) 7.5-325 MG per tablet 1 tablet twice daily as needed 02/17/14   Historical Provider, MD  isosorbide mononitrate (IMDUR) 60 MG 24 hr tablet TAKE 3 TABLETS BY MOUTH ONCE DAILY 08/12/14   Satira Sark, MD  losartan (COZAAR) 100 MG tablet Take 1 tablet (100 mg total) by mouth daily. 01/18/15   Satira Sark, MD  metFORMIN (GLUCOPHAGE) 500 MG tablet Take 1,000 mg by mouth 2 (two) times daily with a meal.     Historical Provider, MD  metoprolol (LOPRESSOR) 50 MG tablet TAKE 1 AND 1/2 TABLETS BY MOUTH TWICE DAILY 10/17/14   Satira Sark, MD  nitroGLYCERIN (NITROSTAT) 0.4 MG SL tablet Place 1 tablet (0.4 mg total) under the tongue every 5 (five) minutes x 3 doses as needed. For chest pain 04/05/14   Satira Sark, MD  tiotropium (SPIRIVA) 18 MCG inhalation capsule Place 1 capsule (18 mcg total) into inhaler and inhale daily. 11/16/14   Juanito Doom, MD  verapamil (CALAN-SR) 240 MG CR tablet TAKE 1 TABLET (240 MG TOTAL) BY MOUTH DAILY. 05/20/14   Satira Sark, MD  warfarin (COUMADIN) 5 MG tablet Take 5 mg by mouth as directed. MANAGED BY PMD    Historical Provider, MD  zolpidem (AMBIEN) 5 MG tablet Take 5 mg by mouth at bedtime.     Historical Provider, MD   BP 138/62 mmHg  Pulse 82  Temp(Src) 99 F (37.2 C) (Oral)  Resp 24  Ht 5\' 8"  (1.727 m)  Wt 185 lb (83.915 kg)  BMI 28.14 kg/m2  SpO2 96% Physical Exam  Constitutional: He is oriented to person, place, and time. He appears well-developed and well-nourished.  Speaks in full sentences  HENT:  Head: Normocephalic and atraumatic.  Right Ear:  External ear normal.  Left Ear: External ear normal.  Nose: Nose normal.  Eyes: Right eye exhibits no discharge. Left eye exhibits no discharge.  Neck: Neck supple.  Cardiovascular: Normal rate, regular rhythm, normal heart sounds and intact distal pulses.   Pulmonary/Chest: Effort normal. He has wheezes. He has rhonchi.  Abdominal: Soft. He exhibits no distension. There is no tenderness.  Musculoskeletal: He exhibits no edema.       Legs: Neurological: He is alert and oriented to person, place, and time.  Skin: Skin is  warm and dry. He is not diaphoretic.  Nursing note and vitals reviewed.   ED Course  Procedures (including critical care time) Labs Review Labs Reviewed  CBC - Abnormal; Notable for the following:    WBC 15.4 (*)    All other components within normal limits  BASIC METABOLIC PANEL - Abnormal; Notable for the following:    CO2 18 (*)    Glucose, Bld 175 (*)    BUN 31 (*)    Creatinine, Ser 1.39 (*)    GFR calc non Af Amer 48 (*)    GFR calc Af Amer 56 (*)    Anion gap 18 (*)    All other components within normal limits  PROTIME-INR - Abnormal; Notable for the following:    Prothrombin Time 30.7 (*)    INR 3.01 (*)    All other components within normal limits  BRAIN NATRIURETIC PEPTIDE - Abnormal; Notable for the following:    B Natriuretic Peptide 120.1 (*)    All other components within normal limits  I-STAT CG4 LACTIC ACID, ED - Abnormal; Notable for the following:    Lactic Acid, Venous 8.77 (*)    All other components within normal limits  CULTURE, BLOOD (ROUTINE X 2)  CULTURE, BLOOD (ROUTINE X 2)  I-STAT TROPOININ, ED  I-STAT ARTERIAL BLOOD GAS, ED    Imaging Review Dg Chest Port 1 View  02/05/2015   CLINICAL DATA:  Cough and dyspnea.  EXAM: PORTABLE CHEST - 1 VIEW  COMPARISON:  03/15/2014  FINDINGS: There is prior sternotomy and CABG. Heart size is normal, unchanged. The lungs are clear. The pulmonary vasculature is normal. There is mild unchanged  right hemidiaphragm elevation. There is no large effusion.  IMPRESSION: No acute cardiopulmonary findings.   Electronically Signed   By: Andreas Newport M.D.   On: 02/05/2015 17:48     EKG Interpretation   Date/Time:  Sunday February 05 2015 17:20:41 EDT Ventricular Rate:  92 PR Interval:  144 QRS Duration: 74 QT Interval:  341 QTC Calculation: 422 R Axis:   77 Text Interpretation:  Sinus rhythm Left atrial enlargement Probable  anterior infarct, old Minimal ST depression, anterolateral leads no  significant change since June 2015 Confirmed by Regenia Skeeter  MD, Barth Trella [phone])  on 02/05/2015 5:27:52 PM       EKG Interpretation  Date/Time:  Sunday February 05 2015 21:25:10 EDT Ventricular Rate:  104 PR Interval:  167 QRS Duration: 76 QT Interval:  356 QTC Calculation: 468 R Axis:   83 Text Interpretation:  Sinus tachycardia Atrial premature complex Biatrial enlargement Anterior infarct, old Nonspecific repol abnormality, diffuse leads no significant change since 20 minutes prior Confirmed by Karolina Zamor  MD, Ervine Witucki ([id number]) on 02/05/2015 9:41:53 PM        EKG Interpretation  Date/Time:  Sunday February 05 2015 21:25:10 EDT Ventricular Rate:  104 PR Interval:  167 QRS Duration: 76 QT Interval:  356 QTC Calculation: 468 R Axis:   83 Text Interpretation:  Sinus tachycardia Atrial premature complex Biatrial enlargement Anterior infarct, old Nonspecific repol abnormality, diffuse leads no significant change since 20 minutes prior Confirmed by Epimenio Schetter  MD, Arek Spadafore ([id number]) on 02/05/2015 9:41:53 PM       CRITICAL CARE Performed by: Sherwood Gambler T   Total critical care time: 60 minutes  Critical care time was exclusive of separately billable procedures and treating other patients.  Critical care was necessary to treat or prevent imminent or life-threatening deterioration.  Critical care was time spent  personally by me on the following activities: development of treatment plan with patient and/or  surrogate as well as nursing, discussions with consultants, evaluation of patient's response to treatment, examination of patient, obtaining history from patient or surrogate, ordering and performing treatments and interventions, ordering and review of laboratory studies, ordering and review of radiographic studies, pulse oximetry and re-evaluation of patient's condition.  MDM   Final diagnoses:  Leg wound, right, initial encounter  COPD exacerbation  Lactic acidosis    Patient's cough and dyspnea have improved with multiple albuterol treatments. He is given Solu-Medrol given anything for COPD exacerbation is the underlying cause of all the symptoms. Patient's lab work shows mild acidosis on metabolic panel, when lactate is evaluated he has a lactate of 8.7. He has not been hypotensive, febrile, and does not appear septic. He was given IV fluids, however on recheck his lactate has gone up to 10.7. Patient is not a known cirrhotic, is on metformin but does not have any evidence of renal failure. At this time he is now complaining of chest pressure that was not present earlier and he has new ST depressions in his EKG diffusely. He was given nitroglycerin with some relief and some improvement of depressions. Troponins will be recycled, I have discussed with the cardiologist on-call, Dr. Wynonia Lawman who will come evaluate patient but feels that his chest pain and ST depressions are likely due to underlying acidosis. Dr. Renne Crigler of the hospitalist has a value to patient and will admit patient to step down. I see has been consult to due to increasing lactic acidosis. Of note patient does not have abdominal pain and is requesting to eat, highly doubt ischemic gut. Patient given levaquin as only source of infection is the COPD exacerbation without PNA.    Sherwood Gambler, MD 02/06/15 0626  Sherwood Gambler, MD 02/06/15 [phone]

## 2015-02-05 NOTE — Plan of Care (Signed)
Paged by RN about new onset chest pain not associated with coughing, pressure and going down his left arm with associated EKG changes, worsening ST depression throughout. Lactic acid continues to climb, no hypotension / hypoperfusion or sepsis picture. Patient seen and examined, his chest pain is different than the one at home with coughing.    Dr. Regenia Skeeter already placed call to cardiology and PCCM to evaluate, appreciate input.    Tyan Dy M. Cruzita Lederer, MD [facility] [phone]

## 2015-02-05 NOTE — ED Notes (Signed)
Report attempted 

## 2015-02-06 ENCOUNTER — Inpatient Hospital Stay (HOSPITAL_COMMUNITY): Payer: Medicare Other

## 2015-02-06 DIAGNOSIS — E872 Acidosis, unspecified: Secondary | ICD-10-CM | POA: Insufficient documentation

## 2015-02-06 DIAGNOSIS — R778 Other specified abnormalities of plasma proteins: Secondary | ICD-10-CM | POA: Insufficient documentation

## 2015-02-06 DIAGNOSIS — R079 Chest pain, unspecified: Secondary | ICD-10-CM

## 2015-02-06 DIAGNOSIS — I25118 Atherosclerotic heart disease of native coronary artery with other forms of angina pectoris: Secondary | ICD-10-CM | POA: Insufficient documentation

## 2015-02-06 DIAGNOSIS — R7989 Other specified abnormal findings of blood chemistry: Secondary | ICD-10-CM

## 2015-02-06 LAB — COMPREHENSIVE METABOLIC PANEL
ALK PHOS: 27 U/L — AB (ref 38–126)
ALT: 30 U/L (ref 17–63)
ANION GAP: 17 — AB (ref 5–15)
AST: 39 U/L (ref 15–41)
Albumin: 3.2 g/dL — ABNORMAL LOW (ref 3.5–5.0)
BILIRUBIN TOTAL: 0.8 mg/dL (ref 0.3–1.2)
BUN: 24 mg/dL — AB (ref 6–20)
CALCIUM: 8.8 mg/dL — AB (ref 8.9–10.3)
CO2: 16 mmol/L — AB (ref 22–32)
Chloride: 104 mmol/L (ref 101–111)
Creatinine, Ser: 1.21 mg/dL (ref 0.61–1.24)
GFR calc Af Amer: >60 mL/min (ref >60)
GFR calc non Af Amer: 57 mL/min — ABNORMAL LOW (ref >60)
GLUCOSE: 192 mg/dL — AB (ref 65–99)
Potassium: 4.4 mmol/L (ref 3.5–5.1)
SODIUM: 137 mmol/L (ref 135–145)
TOTAL PROTEIN: 6.6 g/dL (ref 6.5–8.1)

## 2015-02-06 LAB — URINALYSIS, ROUTINE W REFLEX MICROSCOPIC
Bilirubin Urine: NEGATIVE
GLUCOSE, UA: 500 mg/dL — AB
KETONES UR: 15 mg/dL — AB
Leukocytes, UA: NEGATIVE
NITRITE: NEGATIVE
PROTEIN: NEGATIVE mg/dL
Specific Gravity, Urine: 1.013 (ref 1.005–1.030)
UROBILINOGEN UA: 0.2 mg/dL (ref 0.0–1.0)
pH: 5 (ref 5.0–8.0)

## 2015-02-06 LAB — POCT I-STAT TROPONIN I: TROPONIN I, POC: 0.01 ng/mL (ref 0.00–0.08)

## 2015-02-06 LAB — CBC
HEMATOCRIT: 41 % (ref 39.0–52.0)
Hemoglobin: 13.3 g/dL (ref 13.0–17.0)
MCH: 29.6 pg (ref 26.0–34.0)
MCHC: 32.4 g/dL (ref 30.0–36.0)
MCV: 91.1 fL (ref 78.0–100.0)
Platelets: 262 10*3/uL (ref 150–400)
RBC: 4.5 MIL/uL (ref 4.22–5.81)
RDW: 14.7 % (ref 11.5–15.5)
WBC: 19 10*3/uL — ABNORMAL HIGH (ref 4.0–10.5)

## 2015-02-06 LAB — LACTIC ACID, PLASMA: Lactic Acid, Venous: 7.4 mmol/L (ref 0.5–2.0)

## 2015-02-06 LAB — GLUCOSE, CAPILLARY
GLUCOSE-CAPILLARY: 212 mg/dL — AB (ref 65–99)
GLUCOSE-CAPILLARY: 202 mg/dL — AB (ref 65–99)
Glucose-Capillary: 175 mg/dL — ABNORMAL HIGH (ref 65–99)
Glucose-Capillary: 264 mg/dL — ABNORMAL HIGH (ref 65–99)

## 2015-02-06 LAB — URINE MICROSCOPIC-ADD ON

## 2015-02-06 LAB — TROPONIN I
Troponin I: 0.21 ng/mL — ABNORMAL HIGH (ref <0.031)
Troponin I: 1.01 ng/mL (ref <0.031)

## 2015-02-06 LAB — PROTIME-INR
INR: 2.84 — AB (ref 0.00–1.49)
PROTHROMBIN TIME: 29.4 s — AB (ref 11.6–15.2)

## 2015-02-06 MED ORDER — IPRATROPIUM BROMIDE 0.02 % IN SOLN: 0.5000 mg | Freq: Four times a day (QID) | RESPIRATORY_TRACT | Status: DC | PRN

## 2015-02-06 MED ORDER — ZOLPIDEM TARTRATE 5 MG PO TABS
5.0000 mg | ORAL_TABLET | Freq: Every evening | ORAL | Status: DC | PRN
  Administered 2015-02-06 – 2015-02-08 (×3): 5 mg via ORAL
  Filled 2015-02-06 (×3): qty 1

## 2015-02-06 MED ORDER — METHYLPREDNISOLONE SODIUM SUCC 125 MG IJ SOLR
60.0000 mg | Freq: Two times a day (BID) | INTRAMUSCULAR | Status: DC
  Administered 2015-02-06 – 2015-02-07 (×2): 60 mg via INTRAVENOUS
  Filled 2015-02-06: qty 2
  Filled 2015-02-06 (×2): qty 0.96

## 2015-02-06 MED ORDER — IPRATROPIUM-ALBUTEROL 0.5-2.5 (3) MG/3ML IN SOLN
3.0000 mL | Freq: Four times a day (QID) | RESPIRATORY_TRACT | Status: DC
  Administered 2015-02-06 – 2015-02-09 (×13): 3 mL via RESPIRATORY_TRACT
  Filled 2015-02-06 (×13): qty 3

## 2015-02-06 MED ORDER — PREDNISONE 50 MG PO TABS
60.0000 mg | ORAL_TABLET | Freq: Two times a day (BID) | ORAL | Status: DC
  Filled 2015-02-06 (×2): qty 1

## 2015-02-06 MED ORDER — DOCUSATE SODIUM 100 MG PO CAPS
100.0000 mg | ORAL_CAPSULE | Freq: Two times a day (BID) | ORAL | Status: DC
  Administered 2015-02-06 – 2015-02-09 (×7): 100 mg via ORAL
  Filled 2015-02-06 (×5): qty 1

## 2015-02-06 MED ORDER — ARFORMOTEROL TARTRATE 15 MCG/2ML IN NEBU
15.0000 ug | INHALATION_SOLUTION | Freq: Two times a day (BID) | RESPIRATORY_TRACT | Status: DC
  Administered 2015-02-06 – 2015-02-09 (×5): 15 ug via RESPIRATORY_TRACT
  Filled 2015-02-06 (×9): qty 2

## 2015-02-06 MED ORDER — GUAIFENESIN ER 600 MG PO TB12
1200.0000 mg | ORAL_TABLET | Freq: Two times a day (BID) | ORAL | Status: DC
  Administered 2015-02-06: 1200 mg via ORAL
  Administered 2015-02-06: 600 mg via ORAL
  Administered 2015-02-07 – 2015-02-09 (×5): 1200 mg via ORAL
  Filled 2015-02-06 (×8): qty 2

## 2015-02-06 MED ORDER — DM-GUAIFENESIN ER 30-600 MG PO TB12
1.0000 | ORAL_TABLET | Freq: Two times a day (BID) | ORAL | Status: DC
  Administered 2015-02-06: 1 via ORAL
  Filled 2015-02-06 (×2): qty 1

## 2015-02-06 MED ORDER — WARFARIN SODIUM 2.5 MG PO TABS
2.5000 mg | ORAL_TABLET | Freq: Once | ORAL | Status: AC
  Administered 2015-02-06: 2.5 mg via ORAL
  Filled 2015-02-06: qty 1

## 2015-02-06 MED ORDER — MUPIROCIN CALCIUM 2 % EX CREA
TOPICAL_CREAM | Freq: Two times a day (BID) | CUTANEOUS | Status: DC
  Administered 2015-02-06 – 2015-02-08 (×6): via TOPICAL
  Administered 2015-02-09: 1 via TOPICAL
  Filled 2015-02-06 (×2): qty 15

## 2015-02-06 MED ORDER — SENNA 8.6 MG PO TABS
1.0000 | ORAL_TABLET | Freq: Every day | ORAL | Status: DC | PRN
  Administered 2015-02-07: 8.6 mg via ORAL
  Filled 2015-02-06 (×2): qty 1

## 2015-02-06 MED ORDER — HYDROCODONE-ACETAMINOPHEN 5-325 MG PO TABS
1.0000 | ORAL_TABLET | Freq: Four times a day (QID) | ORAL | Status: DC | PRN
  Administered 2015-02-06 – 2015-02-08 (×6): 1 via ORAL
  Filled 2015-02-06 (×6): qty 1

## 2015-02-06 MED ORDER — INSULIN ASPART 100 UNIT/ML ~~LOC~~ SOLN
0.0000 [IU] | Freq: Three times a day (TID) | SUBCUTANEOUS | Status: DC
  Administered 2015-02-06 – 2015-02-07 (×3): 7 [IU] via SUBCUTANEOUS
  Administered 2015-02-07: 3 [IU] via SUBCUTANEOUS
  Administered 2015-02-07: 7 [IU] via SUBCUTANEOUS
  Administered 2015-02-08: 3 [IU] via SUBCUTANEOUS
  Administered 2015-02-08: 11 [IU] via SUBCUTANEOUS
  Administered 2015-02-09: 4 [IU] via SUBCUTANEOUS
  Administered 2015-02-09: 3 [IU] via SUBCUTANEOUS

## 2015-02-06 MED ORDER — LEVOFLOXACIN 500 MG PO TABS: 500.0000 mg | ORAL_TABLET | ORAL | Status: DC

## 2015-02-06 MED ORDER — BUDESONIDE 0.5 MG/2ML IN SUSP
0.5000 mg | Freq: Two times a day (BID) | RESPIRATORY_TRACT | Status: DC
  Administered 2015-02-06 – 2015-02-09 (×6): 0.5 mg via RESPIRATORY_TRACT
  Filled 2015-02-06 (×8): qty 2

## 2015-02-06 MED ORDER — PANTOPRAZOLE SODIUM 40 MG PO TBEC
40.0000 mg | DELAYED_RELEASE_TABLET | Freq: Every day | ORAL | Status: DC
  Administered 2015-02-06 – 2015-02-09 (×4): 40 mg via ORAL
  Filled 2015-02-06 (×4): qty 1

## 2015-02-06 MED ORDER — INSULIN ASPART 100 UNIT/ML ~~LOC~~ SOLN
0.0000 [IU] | Freq: Every day | SUBCUTANEOUS | Status: DC
  Administered 2015-02-06: 3 [IU] via SUBCUTANEOUS

## 2015-02-06 MED ORDER — SODIUM CHLORIDE 0.9 % IV BOLUS (SEPSIS)
500.0000 mL | Freq: Once | INTRAVENOUS | Status: AC
  Administered 2015-02-06: 500 mL via INTRAVENOUS

## 2015-02-06 MED ORDER — ACETAMINOPHEN 325 MG PO TABS: 650.0000 mg | ORAL_TABLET | Freq: Four times a day (QID) | ORAL | Status: DC | PRN

## 2015-02-06 MED ORDER — FLUTICASONE FUROATE-VILANTEROL 100-25 MCG/INH IN AEPB: 1.0000 | INHALATION_SPRAY | Freq: Every day | RESPIRATORY_TRACT | Status: DC

## 2015-02-06 MED ORDER — LEVALBUTEROL HCL 0.63 MG/3ML IN NEBU: 0.6300 mg | INHALATION_SOLUTION | RESPIRATORY_TRACT | Status: DC | PRN

## 2015-02-06 MED ORDER — LEVALBUTEROL HCL 0.63 MG/3ML IN NEBU
0.6300 mg | INHALATION_SOLUTION | Freq: Four times a day (QID) | RESPIRATORY_TRACT | Status: DC | PRN
Start: 2015-02-06 — End: 2015-02-06

## 2015-02-06 NOTE — Progress Notes (Signed)
   Name: Henry [name] MRN: [id number] DOB: 1941-07-04    ADMISSION DATE:  02/05/2015 CONSULTATION DATE:  7/24  REFERRING MD :  Cruzita Lederer ([facility])   CHIEF COMPLAINT:  Dyspnea, rising lactate   BRIEF PATIENT DESCRIPTION:  74yo male with hx recurrent Severe COPD based on recurrent exacerbations and high eosinophils, PE on chronic anticoagulation (lupus anticoag POS), CAD s/p CABG, OSA presented 7/24 with SOB, chest congestion, cough and worsening chest pain.  Initial ER w/u revealed mild EKG changes, mild AKI and a surprisingly elevated lactate of 8.7->10.7.  Of note had negative troponin, hyPERtension and a metabolic acidosis. Pt admitted to SDU per [facility] and PCCM asked to consult.    SIGNIFICANT EVENTS    STUDIES:  2D echo 7/24>>> Xray RLE>>neg   SUBJECTIVE:  Feeling a little better, less chest pain  VITAL SIGNS: Temp:  [97.9 F (36.6 C)-99.1 F (37.3 C)] 99.1 F (37.3 C) (07/25 0826) Pulse Rate:  [81-111] 91 (07/25 0826) Resp:  [14-29] 19 (07/25 0826) BP: (116-180)/(54-93) 151/78 mmHg (07/25 0826) SpO2:  [94 %-99 %] 97 % (07/25 0908) Weight:  [83.915 kg (185 lb)-85.3 kg (188 lb 0.8 oz)] 85.3 kg (188 lb 0.8 oz) (07/24 2157)  PHYSICAL EXAMINATION: General:  Comfortable in bed HENT: NCAT EOMi PULM: Wheeze left base, otherwise clear, diminished air movement CV: RRR, no mgr GI: BS+, soft, mildly distended MSK: normal bulk and tone Neuro: A&Ox4, maew   Recent Labs Lab 02/05/15 1735 02/06/15 0201  NA 137 137  K 4.4 4.4  CL 101 104  CO2 18* 16*  BUN 31* 24*  CREATININE 1.39* 1.21  GLUCOSE 175* 192*    Recent Labs Lab 02/05/15 1735 02/06/15 0201  HGB 14.1 13.3  HCT 43.0 41.0  WBC 15.4* 19.0*  PLT 330 262    7/24 CXR images reviewed> mild atelectasis R base, no infiltrate   ASSESSMENT / PLAN:  I think this is another Exacerbation of COPD.  He has multiple exacerbations per year and he is wheezing now.  Further, the illness started with cough, wheeze, mucus  production.  Chest pain and + troponin> likely demand ischemia from COPD exacerbation  Rising lactate --suspect B type lactic acidosis from metformin, likely some degree of hypoxemia at home with COPD exacerbation  Leg wound   Constipation  Plan-  Stop prednisone, add back solumedrol Add Brovana Increase budesonide Continue xopenex prn Schedule duoneb Add flutter valve Add mucinex Add incentive spirometry Add colace/senna  Updated pt and wife at bedside.    Roselie Awkward, MD [place] [facility] Pager: [phone] Cell: [phone] After 3pm or if no response, call [phone]

## 2015-02-06 NOTE — Progress Notes (Signed)
UR COMPLETED  

## 2015-02-06 NOTE — Progress Notes (Signed)
Pt O2 titrated to 2L, pt stated does not wear O2 at home.  pt O2 saturations are stable 76%, no complications or res distress noted.  RN aware.

## 2015-02-06 NOTE — Progress Notes (Signed)
*  PRELIMINARY RESULTS* Echocardiogram 2D Echocardiogram has been performed.  Leavy Cella 02/06/2015, 11:21 AM

## 2015-02-06 NOTE — Progress Notes (Signed)
Patient Name: Henry [name] Date of Encounter: 02/06/2015   SUBJECTIVE  Breathing improved. Chest pain with cough. Chest tightness improved on nitro drip. No palpitation.   CURRENT MEDS . atorvastatin  80 mg Oral Daily  . budesonide (PULMICORT) nebulizer solution  0.25 mg Nebulization BID  . dextromethorphan-guaiFENesin  1 tablet Oral BID  . fluticasone  2 spray Each Nare Daily  . Fluticasone Furoate-Vilanterol  1 puff Inhalation Daily  . insulin aspart  0-20 Units Subcutaneous TID WC  . insulin aspart  0-5 Units Subcutaneous QHS  . isosorbide mononitrate  180 mg Oral Daily  . [START ON 02/07/2015] levofloxacin (LEVAQUIN) IV  750 mg Intravenous Q48H  . losartan  100 mg Oral Daily  . metoprolol  75 mg Oral BID  . pantoprazole  40 mg Oral QAC breakfast  . predniSONE  60 mg Oral BID  . sodium chloride  3 mL Intravenous Q12H  . verapamil  240 mg Oral Daily  . Warfarin - Pharmacist Dosing Inpatient   Does not apply q1800    OBJECTIVE  Filed Vitals:   02/06/15 0323 02/06/15 0330 02/06/15 0400 02/06/15 0600  BP:  155/73 140/80 151/79  Pulse:  98 91 87  Temp: 98.1 F (36.7 C)     TempSrc: Oral     Resp:  20 14 18   Height:      Weight:      SpO2:  97% 98% 98%    Intake/Output Summary (Last 24 hours) at 02/06/15 0847 Last data filed at 02/06/15 0331  Gross per 24 hour  Intake    120 ml  Output   1300 ml  Net  -1180 ml   Filed Weights   02/05/15 1716 02/05/15 2157  Weight: 185 lb (83.915 kg) 188 lb 0.8 oz (85.3 kg)    PHYSICAL EXAM  General: Pleasant, NAD.  Coughing frequently.  Neuro: Alert and oriented X 3. Moves all extremities spontaneously. Psych: Normal affect. HEENT:  Normal. Somewhat hoarseness noted.   Neck: Supple without bruits or JVD. Lungs:  Resp regular and unlabored. Diminished throughout with good air movement no wheezes rhonchi or rales. Heart: RRR no s3, s4, or murmurs. Abdomen: Soft, non-tender, non-distended, BS + x 4.  Extremities: No  clubbing, cyanosis or edema. DP/PT/Radials 2+ and equal bilaterally.  Accessory Clinical Findings  CBC  Recent Labs  02/05/15 1735 02/06/15 0201  WBC 15.4* 19.0*  HGB 14.1 13.3  HCT 43.0 41.0  MCV 90.3 91.1  PLT 330 546   Basic Metabolic Panel  Recent Labs  02/05/15 1735 02/06/15 0201  NA 137 137  K 4.4 4.4  CL 101 104  CO2 18* 16*  GLUCOSE 175* 192*  BUN 31* 24*  CREATININE 1.39* 1.21  CALCIUM 9.1 8.8*   Liver Function Tests  Recent Labs  02/05/15 2226 02/06/15 0201  AST 43* 39  ALT 32 30  ALKPHOS 32* 27*  BILITOT 0.3 0.8  PROT 7.1 6.6  ALBUMIN 3.6 3.2*   Cardiac Enzymes  Recent Labs  02/05/15 2113 02/05/15 2226 02/06/15 0201  TROPONINI 0.03 0.06* 0.21*   TELE  NSR at rate of 90s. Occasional PVCs.   Radiology/Studies  Dg Chest Port 1 View  02/05/2015   CLINICAL DATA:  Cough and dyspnea.  EXAM: PORTABLE CHEST - 1 VIEW  COMPARISON:  03/15/2014  FINDINGS: There is prior sternotomy and CABG. Heart size is normal, unchanged. The lungs are clear. The pulmonary vasculature is normal. There is mild unchanged right hemidiaphragm elevation.  There is no large effusion.  IMPRESSION: No acute cardiopulmonary findings.   Electronically Signed   By: Andreas Newport M.D.   On: 02/05/2015 17:48   Dg Tibia/fibula Right Port  02/05/2015   CLINICAL DATA:  Wound on the lateral side of the right lower leg.  EXAM: PORTABLE RIGHT TIBIA AND FIBULA - 2 VIEW  COMPARISON:  None.  FINDINGS: Negative for fracture, dislocation or radiopaque foreign body. There is no bone lesion or bony destruction.  IMPRESSION: Negative for significant abnormality.   Electronically Signed   By: Andreas Newport M.D.   On: 02/05/2015 20:12    ASSESSMENT AND PLAN 1. 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 He  may have demand type angina due to his COPD exacerbation vs pericarditis. Trop trend 0.00-->0.06-->0.21. Chest pain with cough. Chest tightness better on nitro drip. Echo pending. May need ischemic eval at some point. Will discuss with MD regarding plan.  - Continue statin, imdur. Arb, bb 2. COPD exacerbation - per primary  3. Elevation of lactate that may be early sepsis 4. Leg ulcer of recent onset - does not looks infected. Wound care consult pending.  5. Diabetes mellitus - per primary  6. Acute onchronic kidney disease, Stage III - creatinine improved.  7. History of recurrent pulmonary emboli-one would wonder whether this could be causative in this setting also. 8 HTN- not well controlled. Consider increasing BB.   Jarrett Soho PA-C Pager [phone]  Patient seen, examined. Available data reviewed. Agree with findings, assessment, and plan as outlined by Robbie Lis, PA-C. I have carefully reviewed his chart and taken his history. His wife is present at the bedside. Clinical symptoms seem most c/w COPD exacerbation as he reports progressive cough and dyspnea over 1-2 weeks leading to admission. However, labs seem out-of-proportion with high blood lactate, elevated troponin, more suggestive of possible sepsis/severe illness. His chest pain is associated with coughing spells but states that IV NTG has helped. Exam shows an alert oriented male in NAD, lungs with diffuse exp rhonchi. Heart is distant and regular without murmur. Legs without edema.   Plan as above:  Await 2D Echo  Continue IV NTG today probably wean off in am  Agree with no anticoagulation as his INR is therapeutic  Doubt PE as INR is therapeutic  I do not anticipate invasive cardiac evaluation unless echo shows significant change from baseline  Will cycle another set of cardiac enzymes as last troponin trending up  Continue metoprolol at current dose  Sherren Mocha, M.D. 02/06/2015 10:01 AM

## 2015-02-06 NOTE — Consult Note (Signed)
WOC wound consult note Reason for Consult: Consult requested for right leg wound.  Pt states he injured it in the yard 6 weeks ago and it has not healed. He has been on antibiotics prior to admission. Area is painful to touch. Wound type: Right leg with full thickness abrasion. Measurement: 2.5X2cm Wound bed: 90% tightly adhered eschar Drainage (amount, consistency, odor) Small amt tan drainage, no odor. Periwound: Erythremia surrounding to 1 cm on edges Dressing procedure/placement/frequency: Conservative sharp wound debridement (CSWD performed at the bedside): Removed 50% eschar, revealing  50% red moist wound bed using scalpel and forceps.  Pt tolerated with mod amt pain after meds given and no bleeding.  Cross-hatched remaining eschar. Bactroban to soften and assist with removal; pt can shower without dressing when he returns home, then apply Bactroban and another dressing. Discussed plan of care with patient and he verbalized understanding. Please re-consult if further assistance is needed.  Thank-you,  Julien Girt MSN, [place], [place], [place], [place]

## 2015-02-06 NOTE — Progress Notes (Addendum)
[facility] PROGRESS NOTE  Henry [name]:[id number] DOB: [birth year] DOA: 02/05/2015 PCP: Sherrie Mustache, MD  Summary 72 male with COPD, h/o PE, DM admitted with copd exacerbation and lactic acidosis.  Developed CP and worsening ST depression in ED. PCCM and cardiology consulted.  Assessment/Plan:  Principal Problem:   COPD exacerbation: Improving on nebulizers steroids oxygen and levofloxacin. Will change to by mouth steroids. Add Mucinex DM. Active Problems: Chest pain with worsening ST depression and mildly elevated troponin: Has been seen by Dr. Wynonia Lawman who ordered an echocardiogram and nitroglycerin drip.   Essential hypertension, benign   Dyslipidemia   Long term current use of anticoagulant therapy for history of PE and positive lupus anticoagulation   Type 2 diabetes mellitus sliding scale for now.   CKD (chronic kidney disease), stage II   AKI (acute kidney injury): Creatinine improving.   Elevated lactic acid level:  May be related to metformin, COPD exacerbation etc.  Lactate decreased to 7 today   History of gastrointestinal bleeding: None currently Leg wound: does not look particularly infected. Wound care consult pending   Code Status:  full Family Communication:  Wife at bedside Disposition Plan:  Continue step down monitoring today. Anticipate will need several more days of hospitalization.  Consultants:  Cardiology  PCCM  Procedures:     Antibiotics:  Levofloxacin 7/24  HPI/Subjective: Breathing easier, but not back to baseline. Has a persistent nagging cough. Chest pain mainly with coughing.  Objective: Filed Vitals:   02/06/15 0600  BP: 151/79  Pulse: 87  Temp:   Resp: 18    Intake/Output Summary (Last 24 hours) at 02/06/15 0817 Last data filed at 02/06/15 0331  Gross per 24 hour  Intake    120 ml  Output   1300 ml  Net  -1180 ml   Filed Weights   02/05/15 1716 02/05/15 2157  Weight: 83.915 kg (185 lb) 85.3 kg (188 lb  0.8 oz)    Exam:   General:  Somewhat hoarse. Coughing occasionally. Appears otherwise fairly comfortable. Oriented and appropriate.  Cardiovascular: Regular rate rhythm without murmurs gallops rubs  Respiratory: Diminished throughout with good air movement no wheezes rhonchi or rales  Abdomen: Soft nontender nondistended  Ext: No clubbing cyanosis or edema. Right leg wound with black eschar  Basic Metabolic Panel:  Recent Labs Lab 02/05/15 1735 02/06/15 0201  NA 137 137  K 4.4 4.4  CL 101 104  CO2 18* 16*  GLUCOSE 175* 192*  BUN 31* 24*  CREATININE 1.39* 1.21  CALCIUM 9.1 8.8*   Liver Function Tests:  Recent Labs Lab 02/05/15 2226 02/06/15 0201  AST 43* 39  ALT 32 30  ALKPHOS 32* 27*  BILITOT 0.3 0.8  PROT 7.1 6.6  ALBUMIN 3.6 3.2*   No results for input(s): LIPASE, AMYLASE in the last 168 hours. No results for input(s): AMMONIA in the last 168 hours. CBC:  Recent Labs Lab 02/05/15 1735 02/06/15 0201  WBC 15.4* 19.0*  HGB 14.1 13.3  HCT 43.0 41.0  MCV 90.3 91.1  PLT 330 262   Cardiac Enzymes:  Recent Labs Lab 02/05/15 2113 02/05/15 2226 02/06/15 0201  TROPONINI 0.03 0.06* 0.21*   BNP (last 3 results)  Recent Labs  02/05/15 1735  BNP 120.1*    ProBNP (last 3 results) No results for input(s): PROBNP in the last 8760 hours.  CBG:  Recent Labs Lab 02/05/15 2257  GLUCAP 165*    Recent Results (from the past 240 hour(s))  MRSA PCR  Screening     Status: None   Collection Time: 02/05/15  9:59 PM  Result Value Ref Range Status   MRSA by PCR NEGATIVE NEGATIVE Final    Comment:        The GeneXpert MRSA Assay (FDA approved for NASAL specimens only), is one component of a comprehensive MRSA colonization surveillance program. It is not intended to diagnose MRSA infection nor to guide or monitor treatment for MRSA infections.      Studies: Dg Chest Port 1 View  02/05/2015   CLINICAL DATA:  Cough and dyspnea.  EXAM: PORTABLE  CHEST - 1 VIEW  COMPARISON:  03/15/2014  FINDINGS: There is prior sternotomy and CABG. Heart size is normal, unchanged. The lungs are clear. The pulmonary vasculature is normal. There is mild unchanged right hemidiaphragm elevation. There is no large effusion.  IMPRESSION: No acute cardiopulmonary findings.   Electronically Signed   By: Andreas Newport M.D.   On: 02/05/2015 17:48   Dg Tibia/fibula Right Port  02/05/2015   CLINICAL DATA:  Wound on the lateral side of the right lower leg.  EXAM: PORTABLE RIGHT TIBIA AND FIBULA - 2 VIEW  COMPARISON:  None.  FINDINGS: Negative for fracture, dislocation or radiopaque foreign body. There is no bone lesion or bony destruction.  IMPRESSION: Negative for significant abnormality.   Electronically Signed   By: Andreas Newport M.D.   On: 02/05/2015 20:12    Scheduled Meds: . atorvastatin  80 mg Oral Daily  . budesonide (PULMICORT) nebulizer solution  0.25 mg Nebulization BID  . fluticasone  2 spray Each Nare Daily  . insulin aspart  0-5 Units Subcutaneous QHS  . insulin aspart  0-9 Units Subcutaneous TID WC  . isosorbide mononitrate  180 mg Oral Daily  . [START ON 02/07/2015] levofloxacin (LEVAQUIN) IV  750 mg Intravenous Q48H  . losartan  100 mg Oral Daily  . methylPREDNISolone (SOLU-MEDROL) injection  60 mg Intravenous 3 times per day  . metoprolol  75 mg Oral BID  . sodium chloride  3 mL Intravenous Q12H  . verapamil  240 mg Oral Daily  . Warfarin - Pharmacist Dosing Inpatient   Does not apply q1800   Continuous Infusions: . sodium chloride 100 mL/hr at 02/05/15 2250  . nitroGLYCERIN 10 mcg/min (02/06/15 0042)    Time spent: 35 minutes  [facility] [email or web address], password [facility] 02/06/2015, 8:17 AM  LOS: 1 day

## 2015-02-06 NOTE — Progress Notes (Signed)
ANTICOAGULATION CONSULT NOTE - Follow-Up Consult  Pharmacy Consult for Warfarin Indication: Hx recurrent PE  Allergies  Allergen Reactions  . Ace Inhibitors Cough    Cough    Patient Measurements: Height: 5\' 8"  (172.7 cm) Weight: 188 lb 0.8 oz (85.3 kg) IBW/kg (Calculated) : 68.4  Vital Signs: Temp: 99.1 F (37.3 C) (07/25 0826) Temp Source: Oral (07/25 0826) BP: 151/78 mmHg (07/25 0826) Pulse Rate: 91 (07/25 0826)  Labs:  Recent Labs  02/05/15 1735  02/05/15 2226 02/06/15 0201 02/06/15 0820  HGB 14.1  --   --  13.3  --   HCT 43.0  --   --  41.0  --   PLT 330  --   --  262  --   LABPROT 30.7*  --   --  29.4*  --   INR 3.01*  --   --  2.84*  --   CREATININE 1.39*  --   --  1.21  --   TROPONINI  --   < > 0.06* 0.21* 1.01*  < > = values in this interval not displayed.  Estimated Creatinine Clearance: 56.1 mL/min (by C-G formula based on Cr of 1.21).   Medical History: Past Medical History  Diagnosis Date  . Coronary atherosclerosis of native coronary artery     a. NSTEMI 2005 s/p CABG (LIMA - LAD, SVG - OM, SVG - PDA). b. DES to Cx 02/2005. c. NSTEMI 07/2010 due to occ Cx - med rx. Known occluded vein grafts.  . Essential hypertension, benign   . Type 2 diabetes mellitus   . Dyslipidemia   . ACE inhibitor intolerance   . Asthma   . Recurrent pulmonary embolism     a. 01/2011. b. Bilateral PE 11/2011 - + lupus anticoagulant preliminary testing. Felt to require lifelong Coumadin.  . Pulmonary nodule     RML by CT 11/2011, stable compared to 2012.  Marland Kitchen Cholelithiasis   . Pulmonary HTN     Echo 11/2011 at time of PE. PA pressure not assessed on 03/2012 echo but RV was back to normal.  . Lupus anticoagulant positive   . GERD (gastroesophageal reflux disease)   . COPD (chronic obstructive pulmonary disease)   . OSA (obstructive sleep apnea)     Assessment: 34 yoM who [facility] on [facility] with SOB/cough concerning for PNA. The patient was on warfarin PTA for  hx recurrent PE and was admitted with a therapeutic INR of 3.06 on PTA dose of 2.5 mg on Mon, 5 mg on T/Th/Fri/Sun, none on Wed/Sat. He had a GIB in 2015 and was taken off of warfarin briefly, then restarted - no issues since restarted.   The patient was on Doxy PTA for a RLE wound and also PO steroids - both of which could increase sensitivity. This admission, the patient was transitioned to Levaquin and Solu-Medrol, which will also increase warfarin sensitivity. There are no reports of bleeding in the pt's chart. We will monitor daily INRs and adjust dose as needed.   Goal of Therapy:  INR 2-3   Plan:  1. Warfarin 2.5 mg po x1 tonight 2. Daily PT/INR 3. Will continue to monitor for any s/sx of bleeding and will follow up with PT/INR in the a.m.   Governor Specking, PharmD Clinical Pharmacy Resident Pager: [phone] 02/06/2015 11:54 AM

## 2015-02-06 NOTE — Progress Notes (Signed)
CRITICAL VALUE ALERT  Critical value received:  Troponin=0.21 and LA=7.4  Date of notification:  02/06/15  Time of notification:  0320  Critical value read back:Yes.    Nurse who received alert:  Jennye Moccasin  MD notified (1st page):  Raliegh Ip Schorr  Time of first page:  [phone]  MD notified (2nd page): Wynonia Lawman  Time of second page: [phone]  Responding MD:  Hilbert Bible and Wynonia Lawman  Time MD responded:  [phone]

## 2015-02-07 DIAGNOSIS — E1122 Type 2 diabetes mellitus with diabetic chronic kidney disease: Secondary | ICD-10-CM

## 2015-02-07 DIAGNOSIS — J441 Chronic obstructive pulmonary disease with (acute) exacerbation: Principal | ICD-10-CM

## 2015-02-07 DIAGNOSIS — I25118 Atherosclerotic heart disease of native coronary artery with other forms of angina pectoris: Secondary | ICD-10-CM

## 2015-02-07 DIAGNOSIS — E872 Acidosis: Secondary | ICD-10-CM

## 2015-02-07 DIAGNOSIS — N189 Chronic kidney disease, unspecified: Secondary | ICD-10-CM

## 2015-02-07 LAB — GLUCOSE, CAPILLARY
Glucose-Capillary: 214 mg/dL — ABNORMAL HIGH (ref 65–99)
Glucose-Capillary: 216 mg/dL — ABNORMAL HIGH (ref 65–99)
Glucose-Capillary: 126 mg/dL — ABNORMAL HIGH (ref 65–99)
Glucose-Capillary: 175 mg/dL — ABNORMAL HIGH (ref 65–99)

## 2015-02-07 LAB — BASIC METABOLIC PANEL
Anion gap: 12 (ref 5–15)
BUN: 24 mg/dL — ABNORMAL HIGH (ref 6–20)
CO2: 21 mmol/L — AB (ref 22–32)
CREATININE: 1.24 mg/dL (ref 0.61–1.24)
Calcium: 8.7 mg/dL — ABNORMAL LOW (ref 8.9–10.3)
Chloride: 100 mmol/L — ABNORMAL LOW (ref 101–111)
GFR calc non Af Amer: 55 mL/min — ABNORMAL LOW (ref >60)
Glucose, Bld: 191 mg/dL — ABNORMAL HIGH (ref 65–99)
Potassium: 3.4 mmol/L — ABNORMAL LOW (ref 3.5–5.1)
SODIUM: 133 mmol/L — AB (ref 135–145)

## 2015-02-07 LAB — PROTIME-INR
INR: 2.18 — ABNORMAL HIGH (ref 0.00–1.49)
Prothrombin Time: 24.1 seconds — ABNORMAL HIGH (ref 11.6–15.2)

## 2015-02-07 LAB — TROPONIN I: Troponin I: 1.16 ng/mL (ref <0.031)

## 2015-02-07 LAB — LACTIC ACID, PLASMA: Lactic Acid, Venous: 5.1 mmol/L (ref 0.5–2.0)

## 2015-02-07 MED ORDER — HYDROCODONE-HOMATROPINE 5-1.5 MG/5ML PO SYRP
5.0000 mL | ORAL_SOLUTION | Freq: Four times a day (QID) | ORAL | Status: DC | PRN
  Administered 2015-02-07 – 2015-02-08 (×2): 5 mL via ORAL
  Filled 2015-02-07 (×2): qty 5

## 2015-02-07 MED ORDER — POTASSIUM CHLORIDE CRYS ER 20 MEQ PO TBCR
40.0000 meq | EXTENDED_RELEASE_TABLET | Freq: Once | ORAL | Status: AC
  Administered 2015-02-07: 40 meq via ORAL
  Filled 2015-02-07: qty 2

## 2015-02-07 MED ORDER — PREDNISONE 20 MG PO TABS
50.0000 mg | ORAL_TABLET | Freq: Every day | ORAL | Status: DC
  Administered 2015-02-08 – 2015-02-09 (×2): 50 mg via ORAL
  Filled 2015-02-07 (×3): qty 1
  Filled 2015-02-07: qty 2

## 2015-02-07 MED ORDER — WARFARIN SODIUM 5 MG PO TABS
5.0000 mg | ORAL_TABLET | Freq: Once | ORAL | Status: AC
  Administered 2015-02-07: 5 mg via ORAL
  Filled 2015-02-07: qty 1

## 2015-02-07 NOTE — Progress Notes (Signed)
RT instructed patient on use of flutter.  Patient able to demonstrate back proper use.

## 2015-02-07 NOTE — Progress Notes (Signed)
ANTICOAGULATION CONSULT NOTE - Follow Up Consult  Pharmacy Consult for warfarin Indication: hx of recurrent PE  Allergies  Allergen Reactions  . Ace Inhibitors Cough    Cough    Patient Measurements: Height: 5\' 8"  (172.7 cm) Weight: 188 lb 0.8 oz (85.3 kg) IBW/kg (Calculated) : 68.4  Vital Signs: Temp: 98.6 F (37 C) (07/26 0801) Temp Source: Oral (07/26 0801) BP: 148/89 mmHg (07/26 0801) Pulse Rate: 90 (07/26 0801)  Labs:  Recent Labs  02/05/15 1735  02/06/15 0201 02/06/15 0820 02/07/15 0306  HGB 14.1  --  13.3  --   --   HCT 43.0  --  41.0  --   --   PLT 330  --  262  --   --   LABPROT 30.7*  --  29.4*  --  24.1*  INR 3.01*  --  2.84*  --  2.18*  CREATININE 1.39*  --  1.21  --  1.24  TROPONINI  --   < > 0.21* 1.01* 1.16*  < > = values in this interval not displayed.  Estimated Creatinine Clearance: 54.7 mL/min (by C-G formula based on Cr of 1.24).  Assessment:  85 yoM who [facility] with SOB/cough concerning for PNA. The patient was on warfarin PTA for hx recurrent PE and was admitted with a therapeutic INR of 3.06 on PTA dose of 2.5 mg on Mon, 5 mg on T/Th/Fri/Sun, none on Wed/Sat. Current INR 2.18. Hgb 13.3, plts 262. No signs of bleeding.   Goal of Therapy:  INR 2-3 Monitor platelets by anticoagulation protocol: Yes   Plan:  -Warfarin 5 mg po x 1 tonight -Daily PT/INR: monitor for s/sx of bleed -Follow closely due to addition of levofloxacin/methylpred  Angela Burke, PharmD Pharmacy Resident Pager: [phone] 02/07/2015,9:58 AM

## 2015-02-07 NOTE — Progress Notes (Signed)
Inpatient Diabetes Program Recommendations  AACE/ADA: New Consensus Statement on Inpatient Glycemic Control (2013)  Target Ranges:  Prepandial:   less than 140 mg/dL      Peak postprandial:   less than 180 mg/dL (1-2 hours)      Critically ill patients:  140 - 180 mg/dL   Reason for assessment: elevated CBG  Diabetes history: Type 2 diabetes Outpatient Diabetes medications: Metformin 1000mg  bid  Current orders for Inpatient glycemic control: Novolog 0-5 units qhs, Novolog 0-20 units tid with meals.  Post prandial blood sugars elevated despite current Novolog correction- consider adding Novolog mealtime insulin 5 units tid.  Continue Novolog correction as ordered.   Gentry Fitz, RN, BA, MHA, CDE Diabetes Coordinator Inpatient Diabetes Program  [phone] (Team Pager) [phone] ([place]) 02/07/2015 10:32 AM

## 2015-02-07 NOTE — Progress Notes (Signed)
Patient Profile: 74 y/o male with h/o CAD s/p CABG with known occlusion of his 2 vein grafts on last LHC in 2012, COPD, CKD, HTN, prior PE now on Coumadin and DM, admitted for acute COPD exacerbation and chest pain.   Subjective: No further chest pressure but he is on IV nitro. He does report pain between his shoulder blades last PM that has resolved.   Objective: Vital signs in last 24 hours: Temp:  [97.9 F (36.6 C)-99 F (37.2 C)] 98.6 F (37 C) (07/26 0801) Pulse Rate:  [76-95] 90 (07/26 0801) Resp:  [12-20] 14 (07/26 0801) BP: (123-168)/(62-89) 148/89 mmHg (07/26 0801) SpO2:  [94 %-98 %] 98 % (07/26 0801)    Intake/Output from previous day: 07/25 0701 - 07/26 0700 In: 2334.7 [P.O.:1080; I.V.:1254.7] Out: 2225 [Urine:2225] Intake/Output this shift:    Medications Current Facility-Administered Medications  Medication Dose Route Frequency Provider Last Rate Last Dose  . 0.9 %  sodium chloride infusion   Intravenous Continuous Delfina Redwood, MD 50 mL/hr at 02/06/15 0900    . acetaminophen (TYLENOL) tablet 650 mg  650 mg Oral Q6H PRN Delfina Redwood, MD      . arformoterol [facility]) nebulizer solution 15 mcg  15 mcg Nebulization BID Juanito Doom, MD   15 mcg at 02/07/15 0801  . atorvastatin (LIPITOR) tablet 80 mg  80 mg Oral Daily Caren Griffins, MD   80 mg at 02/06/15 1004  . budesonide (PULMICORT) nebulizer solution 0.5 mg  0.5 mg Nebulization BID Juanito Doom, MD   0.5 mg at 02/07/15 0801  . chlorpheniramine-HYDROcodone (TUSSIONEX) 10-8 MG/5ML suspension 5 mL  5 mL Oral Q12H PRN Marijean Heath, NP   5 mL at 02/07/15 0234  . docusate sodium (COLACE) capsule 100 mg  100 mg Oral BID Juanito Doom, MD   100 mg at 02/06/15 2135  . fluticasone (FLONASE) 50 MCG/ACT nasal spray 2 spray  2 spray Each Nare Daily Costin Karlyne Greenspan, MD   2 spray at 02/06/15 1006  . guaiFENesin (MUCINEX) 12 hr tablet 1,200 mg  1,200 mg Oral BID Juanito Doom, MD    1,200 mg at 02/06/15 2135  . HYDROcodone-acetaminophen (NORCO/VICODIN) 5-325 MG per tablet 1 tablet  1 tablet Oral Q6H PRN Delfina Redwood, MD   1 tablet at 02/06/15 2245  . insulin aspart (novoLOG) injection 0-20 Units  0-20 Units Subcutaneous TID WC Delfina Redwood, MD   7 Units at 02/07/15 0800  . insulin aspart (novoLOG) injection 0-5 Units  0-5 Units Subcutaneous QHS Delfina Redwood, MD   3 Units at 02/06/15 2141  . ipratropium-albuterol (DUONEB) 0.5-2.5 (3) MG/3ML nebulizer solution 3 mL  3 mL Nebulization Q6H Juanito Doom, MD   3 mL at 02/07/15 0801  . isosorbide mononitrate (IMDUR) 24 hr tablet 180 mg  180 mg Oral Daily Caren Griffins, MD   180 mg at 02/06/15 1006  . levalbuterol (XOPENEX) nebulizer solution 0.63 mg  0.63 mg Nebulization Q2H PRN Juanito Doom, MD      . levofloxacin (LEVAQUIN) IVPB 750 mg  750 mg Intravenous Q48H Kendra P Hiatt, RPH      . losartan (COZAAR) tablet 100 mg  100 mg Oral Daily Caren Griffins, MD   100 mg at 02/06/15 1007  . methylPREDNISolone sodium succinate (SOLU-MEDROL) 125 mg/2 mL injection 60 mg  60 mg Intravenous Q12H Juanito Doom, MD   60 mg at 02/07/15 0102  .  metoprolol tartrate (LOPRESSOR) tablet 75 mg  75 mg Oral BID Caren Griffins, MD   75 mg at 02/06/15 2136  . mupirocin cream (BACTROBAN) 2 %   Topical BID Delfina Redwood, MD      . nitroGLYCERIN 50 mg in dextrose 5 % 250 mL (0.2 mg/mL) infusion  2-200 mcg/min Intravenous Titrated Jacolyn Reedy, MD 3 mL/hr at 02/06/15 0042 10 mcg/min at 02/06/15 0042  . pantoprazole (PROTONIX) EC tablet 40 mg  40 mg Oral QAC breakfast Delfina Redwood, MD   40 mg at 02/07/15 0810  . senna (SENOKOT) tablet 8.6 mg  1 tablet Oral Daily PRN Juanito Doom, MD      . sodium chloride 0.9 % injection 3 mL  3 mL Intravenous Q12H Caren Griffins, MD   3 mL at 02/06/15 2136  . verapamil (CALAN-SR) CR tablet 240 mg  240 mg Oral Daily Caren Griffins, MD   240 mg at 02/06/15 1008  .  Warfarin - Pharmacist Dosing Inpatient   Does not apply q1800 Rolla Flatten, RPH   0  at 02/06/15 1800  . zolpidem (AMBIEN) tablet 5 mg  5 mg Oral QHS PRN Delfina Redwood, MD   5 mg at 02/06/15 2135    PE: General appearance: alert, cooperative and no distress Neck: no carotid bruit and no JVD Lungs: bilateral diffuse rhonchi  Heart: regular rate and rhythm, S1, S2 normal, no murmur, click, rub or gallop Extremities: no LEE Pulses: 2+ and symmetric Skin: warm and dry Neurologic: Grossly normal  Lab Results:   Recent Labs  02/05/15 1735 02/06/15 0201  WBC 15.4* 19.0*  HGB 14.1 13.3  HCT 43.0 41.0  PLT 330 262   BMET  Recent Labs  02/05/15 1735 02/06/15 0201 02/07/15 0306  NA 137 137 133*  K 4.4 4.4 3.4*  CL 101 104 100*  CO2 18* 16* 21*  GLUCOSE 175* 192* 191*  BUN 31* 24* 24*  CREATININE 1.39* 1.21 1.24  CALCIUM 9.1 8.8* 8.7*   PT/INR  Recent Labs  02/05/15 1735 02/06/15 0201 02/07/15 0306  LABPROT 30.7* 29.4* 24.1*  INR 3.01* 2.84* 2.18*   Cardiac Panel (last 3 results)  Recent Labs  02/06/15 0201 02/06/15 0820 02/07/15 0306  TROPONINI 0.21* 1.01* 1.16*    Studies/Results: 2D echo 02/06/15  Study Conclusions  - Left ventricle: The cavity size was normal. Wall thickness was increased in a pattern of mild LVH. Systolic function was normal. The estimated ejection fraction was in the range of 60% to 65%. Wall motion was normal; there were no regional wall motion abnormalities. Doppler parameters are consistent with abnormal left ventricular relaxation (grade 1 diastolic dysfunction). Doppler parameters are consistent with high ventricular filling pressure. - Mitral valve: Calcified annulus. - Left atrium: The atrium was moderately dilated.   Assessment/Plan  Principal Problem:   COPD exacerbation Active Problems:   Essential hypertension, benign   Dyslipidemia   Long term current use of anticoagulant therapy    Type 2 diabetes mellitus   CKD (chronic kidney disease), stage II   AKI (acute kidney injury)   Elevated lactic acid level   History of gastrointestinal bleeding   Lactic acidosis   Troponin level elevated   Atherosclerosis of native coronary artery of native heart with other form of angina pectoris   1. Chest Pain: patient has had further increases in his cardiac enzyme levels since our last assessment yesterday. Troponin 0.21-->1.01-->1.16. 2D echo however shows normal  LV systolic function with EF of 60-65% and no WMA. He denies any further chest pressure, but at the same time he has been on IV nitro. He did report pain between his shoulder blades last PM that has resolved. Given his h/o CAD and rise in troponin levels, may need to consider either stress test to assess for ischemia or definitive LHC. Will defer to MD. Likely not a good lexiscan stress test candidate given his COPD.    LOS: 2 days    Brittainy M. Ladoris Gene 02/07/2015 9:22 AM  I have personally seen and examined this patient with Lyda Jester, PA-C. I agree with the assessment and plan as outlined above. Pt with known CAD s/p CABG. Admitted with severe COPD exacerbation. Echo with normal LV systolic function. Troponin up to 1.1. He has no chest pain. I agree that his troponin elevation is likely due to demand ischemia in setting of COPD exacerbation. He is full anti-coagulated on coumadin. I would not pursue stress testing at this time. I would only stop his coumadin and plan cath if he had angina that could not be controlled with medical therapy. Will stop NTG drip. He is on Imdur 180 mg daily. We will follow along.   MCALHANY,CHRISTOPHER 02/07/2015 10:08 AM

## 2015-02-07 NOTE — Progress Notes (Signed)
[facility] PROGRESS NOTE  Henry [name] ZOX:[id number] DOB: May 06, 1941 DOA: 02/05/2015 PCP: Sherrie Mustache, MD  Summary 77 male with COPD, h/o PE, DM admitted with copd exacerbation and lactic acidosis.  Developed CP and worsening ST depression in ED. PCCM and cardiology consulted.  Assessment/Plan:    COPD exacerbation: Improving on nebulizers, steroids (IV), oxygen, and levofloxacin.  -Mucinex DM. -pulm consult  Chest pain with worsening ST depression and mildly elevated troponin:  -cards consult - echocardiogram (see below: EF 60-65% with no wall motion abnormality) and nitroglycerin drip.    Essential hypertension, benign   Dyslipidemia   Long term current use of anticoagulant therapy for history of PE and positive lupus anticoagulation   Type 2 diabetes mellitus sliding scale for now.   CKD (chronic kidney disease), stage II   AKI (acute kidney injury): Creatinine improving.   Elevated lactic acid level:  May be related to metformin, COPD exacerbation etc.  Lactate decreased to 5   History of gastrointestinal bleeding: None currently Leg wound: does not look particularly infected. Wound care consult done   Code Status:  full Family Communication:   Disposition Plan:  Continue step down monitoring today. Anticipate will need several more days of hospitalization.  Consultants:  Cardiology  PCCM  Procedures:   Echo: Study Conclusions  - Left ventricle: The cavity size was normal. Wall thickness was increased in a pattern of mild LVH. Systolic function was normal. The estimated ejection fraction was in the range of 60% to 65%. Wall motion was normal; there were no regional wall motion abnormalities. Doppler parameters are consistent with abnormal left ventricular relaxation (grade 1 diastolic dysfunction). Doppler parameters are consistent with high ventricular filling pressure. - Mitral valve: Calcified annulus. - Left atrium: The  atrium was moderately dilated.   Antibiotics:  Levofloxacin 7/24  HPI/Subjective: Mainly dry cough  Objective: Filed Vitals:   02/07/15 0402  BP:   Pulse:   Temp: 97.9 F (36.6 C)  Resp:     Intake/Output Summary (Last 24 hours) at 02/07/15 0747 Last data filed at 02/07/15 8119  Gross per 24 hour  Intake 2334.65 ml  Output   2225 ml  Net 109.65 ml   Filed Weights   02/05/15 1716 02/05/15 2157  Weight: 83.915 kg (185 lb) 85.3 kg (188 lb 0.8 oz)    Exam:   General:  Coughing occasionally. No increase work of breathing, Oriented and appropriate.  Cardiovascular: Regular rate rhythm without murmurs gallops rubs  Respiratory: Diminished throughout with good air movement no wheezes rhonchi or rales  Abdomen: Soft nontender nondistended  Ext: No clubbing cyanosis or edema. Right leg wound covered  Basic Metabolic Panel:  Recent Labs Lab 02/05/15 1735 02/06/15 0201 02/07/15 0306  NA 137 137 133*  K 4.4 4.4 3.4*  CL 101 104 100*  CO2 18* 16* 21*  GLUCOSE 175* 192* 191*  BUN 31* 24* 24*  CREATININE 1.39* 1.21 1.24  CALCIUM 9.1 8.8* 8.7*   Liver Function Tests:  Recent Labs Lab 02/05/15 2226 02/06/15 0201  AST 43* 39  ALT 32 30  ALKPHOS 32* 27*  BILITOT 0.3 0.8  PROT 7.1 6.6  ALBUMIN 3.6 3.2*   No results for input(s): LIPASE, AMYLASE in the last 168 hours. No results for input(s): AMMONIA in the last 168 hours. CBC:  Recent Labs Lab 02/05/15 1735 02/06/15 0201  WBC 15.4* 19.0*  HGB 14.1 13.3  HCT 43.0 41.0  MCV 90.3 91.1  PLT 330 262   Cardiac  Enzymes:  Recent Labs Lab 02/05/15 2113 02/05/15 2226 02/06/15 0201 02/06/15 0820 02/07/15 0306  TROPONINI 0.03 0.06* 0.21* 1.01* 1.16*   BNP (last 3 results)  Recent Labs  02/05/15 1735  BNP 120.1*    ProBNP (last 3 results) No results for input(s): PROBNP in the last 8760 hours.  CBG:  Recent Labs Lab 02/05/15 2257 02/06/15 0828 02/06/15 1134 02/06/15 1636  02/06/15 2134  GLUCAP 165* 175* 212* 202* 264*    Recent Results (from the past 240 hour(s))  Culture, blood (routine x 2)     Status: None (Preliminary result)   Collection Time: 02/05/15  7:35 PM  Result Value Ref Range Status   Specimen Description BLOOD RIGHT ARM  Final   Special Requests BOTTLES DRAWN AEROBIC AND ANAEROBIC 10CC  Final   Culture NO GROWTH < 24 HOURS  Final   Report Status PENDING  Incomplete  Culture, blood (routine x 2)     Status: None (Preliminary result)   Collection Time: 02/05/15  7:42 PM  Result Value Ref Range Status   Specimen Description BLOOD RIGHT HAND  Final   Special Requests BOTTLES DRAWN AEROBIC AND ANAEROBIC 10CC  Final   Culture NO GROWTH < 24 HOURS  Final   Report Status PENDING  Incomplete  MRSA PCR Screening     Status: None   Collection Time: 02/05/15  9:59 PM  Result Value Ref Range Status   MRSA by PCR NEGATIVE NEGATIVE Final    Comment:        The GeneXpert MRSA Assay (FDA approved for NASAL specimens only), is one component of a comprehensive MRSA colonization surveillance program. It is not intended to diagnose MRSA infection nor to guide or monitor treatment for MRSA infections.      Studies: Dg Chest Port 1 View  02/05/2015   CLINICAL DATA:  Cough and dyspnea.  EXAM: PORTABLE CHEST - 1 VIEW  COMPARISON:  03/15/2014  FINDINGS: There is prior sternotomy and CABG. Heart size is normal, unchanged. The lungs are clear. The pulmonary vasculature is normal. There is mild unchanged right hemidiaphragm elevation. There is no large effusion.  IMPRESSION: No acute cardiopulmonary findings.   Electronically Signed   By: Andreas Newport M.D.   On: 02/05/2015 17:48   Dg Tibia/fibula Right Port  02/05/2015   CLINICAL DATA:  Wound on the lateral side of the right lower leg.  EXAM: PORTABLE RIGHT TIBIA AND FIBULA - 2 VIEW  COMPARISON:  None.  FINDINGS: Negative for fracture, dislocation or radiopaque foreign body. There is no bone lesion or  bony destruction.  IMPRESSION: Negative for significant abnormality.   Electronically Signed   By: Andreas Newport M.D.   On: 02/05/2015 20:12    Scheduled Meds: . arformoterol  15 mcg Nebulization BID  . atorvastatin  80 mg Oral Daily  . budesonide (PULMICORT) nebulizer solution  0.5 mg Nebulization BID  . docusate sodium  100 mg Oral BID  . fluticasone  2 spray Each Nare Daily  . guaiFENesin  1,200 mg Oral BID  . insulin aspart  0-20 Units Subcutaneous TID WC  . insulin aspart  0-5 Units Subcutaneous QHS  . ipratropium-albuterol  3 mL Nebulization Q6H  . isosorbide mononitrate  180 mg Oral Daily  . levofloxacin (LEVAQUIN) IV  750 mg Intravenous Q48H  . losartan  100 mg Oral Daily  . methylPREDNISolone (SOLU-MEDROL) injection  60 mg Intravenous Q12H  . metoprolol  75 mg Oral BID  . mupirocin cream  Topical BID  . pantoprazole  40 mg Oral QAC breakfast  . sodium chloride  3 mL Intravenous Q12H  . verapamil  240 mg Oral Daily  . Warfarin - Pharmacist Dosing Inpatient   Does not apply q1800   Continuous Infusions: . sodium chloride 50 mL/hr at 02/06/15 0900  . nitroGLYCERIN 10 mcg/min (02/06/15 0042)    Time spent: 25 minutes  Alvaretta Eisenberger  [facility] [email or web address], password [facility] 02/07/2015, 7:47 AM  LOS: 2 days

## 2015-02-07 NOTE — Progress Notes (Signed)
Np made aware of LA 5.1 an Trop 1.16. Pt has no complaints of chest pain at this time. No orders give at this time. Will continue to monitor pt.

## 2015-02-07 NOTE — Care Management Note (Signed)
Case Management Note  Patient Details  Name: LEIGHTON BRICKLEY MRN: [id number] Date of Birth: August 14, 1940  Subjective/Objective:                 Admitted with COPD exacerbation   Action/Plan: Return to home when medically stable. CM to f/u with d/c disposition.  Expected Discharge Date:                  Expected Discharge Plan:  Home/Self Care  In-House Referral:     Discharge planning Services     Post Acute Care Choice:    Choice offered to:     DME Arranged:    DME Agency:     HH Arranged:    HH Agency:     Status of Service:  In process, will continue to follow  Medicare Important Message Given:  Yes-second notification given Date Medicare IM Given:    Medicare IM give by:    Date Additional Medicare IM Given:    Additional Medicare Important Message give by:     If discussed at [address] of Stay Meetings, dates discussed:    Additional Comments: Jaquan Sadowsky (Spouse)  [phone]  Sharin Mons, RN 02/07/2015, 4:01 PM

## 2015-02-07 NOTE — Progress Notes (Signed)
   Name: Henry [name] MRN: [id number] DOB: Dec 18, 1940    ADMISSION DATE:  02/05/2015 CONSULTATION DATE:  7/24  REFERRING MD :  Cruzita Lederer ([facility])   CHIEF COMPLAINT:  Dyspnea, rising lactate   BRIEF PATIENT DESCRIPTION:  74yo male with hx recurrent Severe COPD based on recurrent exacerbations and high eosinophils, PE on chronic anticoagulation (lupus anticoag POS), CAD s/p CABG, OSA presented 7/24 with SOB, chest congestion, cough and worsening chest pain.  Initial ER w/u revealed mild EKG changes, mild AKI and a surprisingly elevated lactate of 8.7->10.7.  Of note had negative troponin, hyPERtension and a metabolic acidosis. Pt admitted to SDU per [facility] and PCCM asked to consult.    SIGNIFICANT EVENTS    STUDIES:  2D echo 7/24>>> Xray RLE>>neg   SUBJECTIVE:  Feeling a little better, no chest pain, still has coughing and significant chest congestion  VITAL SIGNS: Temp:  [97.9 F (36.6 C)-99 F (37.2 C)] 98.6 F (37 C) (07/26 0801) Pulse Rate:  [76-93] 90 (07/26 0801) Resp:  [12-20] 14 (07/26 0801) BP: (123-152)/(62-89) 148/89 mmHg (07/26 0801) SpO2:  [94 %-98 %] 98 % (07/26 0801)  PHYSICAL EXAMINATION: General:  Comfortable in chair HENT: NCAT EOMi PULM: no wheeze however there is significant rhonchi, good air movement CV: RRR, no mgr GI: BS+, soft, mildly distended MSK: normal bulk and tone Neuro: A&Ox4, maew   Recent Labs Lab 02/05/15 1735 02/06/15 0201 02/07/15 0306  NA 137 137 133*  K 4.4 4.4 3.4*  CL 101 104 100*  CO2 18* 16* 21*  BUN 31* 24* 24*  CREATININE 1.39* 1.21 1.24  GLUCOSE 175* 192* 191*    Recent Labs Lab 02/05/15 1735 02/06/15 0201  HGB 14.1 13.3  HCT 43.0 41.0  WBC 15.4* 19.0*  PLT 330 262    7/24 CXR images reviewed> mild atelectasis R base, no infiltrate   ASSESSMENT / PLAN:  COPD exacerbation> slightly improved; still has significant chest congestion  Chest pain and + troponin> likely demand ischemia from COPD  exacerbation  Rising lactate --suspect B type lactic acidosis from metformin + hypoxemia from COPD exacerbation; no belly pain to suggest bowel ischemia  Leg wound   Constipation  Plan-  Continue to hold metformin Daily lactic acid Change back to prednisone tomorrow Continue Brovana + budesonide Continue xopenex prn Schedule duoneb Continue flutter valve Continue mucinex Continue incentive spirometry Continue colace/senna Change tussionex to hycodan Consider CT abdomen tomorrow if lactic acid not clear  Updated pt and wife at bedside.    Roselie Awkward, MD [place] [facility] Pager: [phone] Cell: [phone] After 3pm or if no response, call [phone]

## 2015-02-07 NOTE — Progress Notes (Signed)
Paged with elevated trop. Cardiology is already on board and NP reviewed note. Pt is on IV NTG and INR is therapeutic on coumadin so no other anticoagulation being used. Therefore, no change in plan at this time. No active chest pain except occasionally with coughing.  KJKG, NP

## 2015-02-07 NOTE — Care Management Important Message (Signed)
Important Message  Patient Details  Name: KHAN CHURA MRN: [id number] Date of Birth: September 27, 1940   Medicare Important Message Given:  [facility] notification given    Nathen May 02/07/2015, 1:57 Kathryn Message  Patient Details  Name: BILLY TURVEY MRN: [id number] Date of Birth: 10-21-1940   Medicare Important Message Given:  Yes-second notification given    Nathen May 02/07/2015, 1:57 PM

## 2015-02-08 ENCOUNTER — Ambulatory Visit: Payer: Medicare Other | Admitting: Pulmonary Disease

## 2015-02-08 DIAGNOSIS — I10 Essential (primary) hypertension: Secondary | ICD-10-CM

## 2015-02-08 LAB — BASIC METABOLIC PANEL
Anion gap: 9 (ref 5–15)
BUN: 27 mg/dL — AB (ref 6–20)
CO2: 27 mmol/L (ref 22–32)
CREATININE: 1.18 mg/dL (ref 0.61–1.24)
Calcium: 8.9 mg/dL (ref 8.9–10.3)
Chloride: 101 mmol/L (ref 101–111)
GFR calc Af Amer: >60 mL/min (ref >60)
GFR calc non Af Amer: 59 mL/min — ABNORMAL LOW (ref >60)
GLUCOSE: 129 mg/dL — AB (ref 65–99)
Potassium: 4 mmol/L (ref 3.5–5.1)
SODIUM: 137 mmol/L (ref 135–145)

## 2015-02-08 LAB — GLUCOSE, CAPILLARY
Glucose-Capillary: 128 mg/dL — ABNORMAL HIGH (ref 65–99)
Glucose-Capillary: 278 mg/dL — ABNORMAL HIGH (ref 65–99)
Glucose-Capillary: 200 mg/dL — ABNORMAL HIGH (ref 65–99)

## 2015-02-08 LAB — CBC
HCT: 38.6 % — ABNORMAL LOW (ref 39.0–52.0)
HEMOGLOBIN: 12.6 g/dL — AB (ref 13.0–17.0)
MCH: 29.7 pg (ref 26.0–34.0)
MCHC: 32.6 g/dL (ref 30.0–36.0)
MCV: 91 fL (ref 78.0–100.0)
PLATELETS: 254 10*3/uL (ref 150–400)
RBC: 4.24 MIL/uL (ref 4.22–5.81)
RDW: 14.9 % (ref 11.5–15.5)
WBC: 20.4 10*3/uL — ABNORMAL HIGH (ref 4.0–10.5)

## 2015-02-08 LAB — LACTIC ACID, PLASMA
Lactic Acid, Venous: 3.4 mmol/L (ref 0.5–2.0)
Lactic Acid, Venous: 4.1 mmol/L (ref 0.5–2.0)

## 2015-02-08 LAB — PROTIME-INR
INR: 2.23 — ABNORMAL HIGH (ref 0.00–1.49)
Prothrombin Time: 24.5 seconds — ABNORMAL HIGH (ref 11.6–15.2)

## 2015-02-08 MED ORDER — WARFARIN SODIUM 2.5 MG PO TABS
2.5000 mg | ORAL_TABLET | Freq: Once | ORAL | Status: AC
  Administered 2015-02-08: 2.5 mg via ORAL
  Filled 2015-02-08: qty 1

## 2015-02-08 NOTE — Progress Notes (Signed)
ANTICOAGULATION CONSULT NOTE - Follow Up Consult  Pharmacy Consult for Coumadin Indication: Hx of recurrent PE  Allergies  Allergen Reactions  . Ace Inhibitors Cough    Cough    Patient Measurements: Height: 5\' 8"  (172.7 cm) Weight: 188 lb 0.8 oz (85.3 kg) IBW/kg (Calculated) : 68.4  Vital Signs: Temp: 98.6 F (37 C) (07/27 0800) Temp Source: Oral (07/27 0800) BP: 114/59 mmHg (07/26 2333) Pulse Rate: 78 (07/26 2333)  Labs:  Recent Labs  02/05/15 1735  02/06/15 0201 02/06/15 0820 02/07/15 0306 02/08/15 0231  HGB 14.1  --  13.3  --   --  12.6*  HCT 43.0  --  41.0  --   --  38.6*  PLT 330  --  262  --   --  254  LABPROT 30.7*  --  29.4*  --  24.1* 24.5*  INR 3.01*  --  2.84*  --  2.18* 2.23*  CREATININE 1.39*  --  1.21  --  1.24 1.18  TROPONINI  --   < > 0.21* 1.01* 1.16*  --   < > = values in this interval not displayed.  Estimated Creatinine Clearance: 57.5 mL/min (by C-G formula based on Cr of 1.18).  Assessment: 48 yoM who presented to the [facility] on 7/24 with SOB/cough concerning for PNA. On warfarin PTA for hx recurrent PE, admitted with a therapeutic INR of 3.06. Noted hx of GIB in '15 and taken off of warfarin briefly, then restarted - no issues since restarted. Hgb 12.6, Plt wnl, INR remains therapeutic at 2.23. Pt also receiving levaquin.  PTA Coumadin dose is 5mg  daily exc 2.5mg  on Mon, and none Wed/Sat  Goal of Therapy:  INR 2-3 Monitor platelets by anticoagulation protocol: Yes   Plan:  Give coumadin 2.5mg  PO x 1 tonight Monitor daily INR, CBC, s/s of bleed F/U duration of abx  Ajai Harville J 02/08/2015,8:47 AM

## 2015-02-08 NOTE — Progress Notes (Signed)
[facility] PROGRESS NOTE  Henry [name] RFF:[id number] DOB: 1941/05/03 DOA: 02/05/2015 PCP: Sherrie Mustache, MD  Summary 72 male with COPD, h/o PE, DM admitted with copd exacerbation and lactic acidosis.  Developed CP and worsening ST depression in ED. PCCM and cardiology consulted.  Assessment/Plan:    COPD exacerbation: Improving on nebulizers, steroids (IV-wean to PO), oxygen, and levofloxacin.  -Mucinex DM. -pulm consult: prednisone will wean slowly to 5mg  daily as outpatient and will have him start monthly doxycycline (first week of month) in September; continue current inhaled therapies in hospital and then return to home regimen at discharge  Chest pain with worsening ST depression and mildly elevated troponin:  -cards consult - echocardiogram (see below: EF 60-65% with no wall motion abnormality) and nitroglycerin drip.    Essential hypertension, benign   Dyslipidemia   Long term current use of anticoagulant therapy for history of PE and positive lupus anticoagulation   Type 2 diabetes mellitus sliding scale for now.   CKD (chronic kidney disease), stage II   AKI (acute kidney injury): Creatinine improving.   Elevated lactic acid level:  May be related to metformin, COPD exacerbation etc.  Lactate decreased to 5- recheck== no metformin at d/c   History of gastrointestinal bleeding: None currently Leg wound: does not look particularly infected. Wound care consult done   Code Status:  full Family Communication:  Wife at bedside Disposition Plan:  tx to tele  Consultants:  Cardiology  PCCM  Procedures:   Echo: Study Conclusions  - Left ventricle: The cavity size was normal. Wall thickness was increased in a pattern of mild LVH. Systolic function was normal. The estimated ejection fraction was in the range of 60% to 65%. Wall motion was normal; there were no regional wall motion abnormalities. Doppler parameters are consistent with  abnormal left ventricular relaxation (grade 1 diastolic dysfunction). Doppler parameters are consistent with high ventricular filling pressure. - Mitral valve: Calcified annulus. - Left atrium: The atrium was moderately dilated.   Antibiotics:  Levofloxacin 7/24  HPI/Subjective: Breathing has improved  Objective: Filed Vitals:   02/08/15 0940  BP: 149/89  Pulse: 89  Temp:   Resp:     Intake/Output Summary (Last 24 hours) at 02/08/15 1122 Last data filed at 02/08/15 0507  Gross per 24 hour  Intake  941.1 ml  Output   1700 ml  Net -758.9 ml   Filed Weights   02/05/15 1716 02/05/15 2157  Weight: 83.915 kg (185 lb) 85.3 kg (188 lb 0.8 oz)    Exam:   General:  Coughing occasionally. No increase work of breathing, Oriented and appropriate.  Cardiovascular: Regular rate rhythm without murmurs gallops rubs  Respiratory: Diminished throughout with good air movement no wheezes rhonchi or rales  Abdomen: Soft nontender nondistended  Ext: No clubbing cyanosis or edema. Right leg wound covered  Basic Metabolic Panel:  Recent Labs Lab 02/05/15 1735 02/06/15 0201 02/07/15 0306 02/08/15 0231  NA 137 137 133* 137  K 4.4 4.4 3.4* 4.0  CL 101 104 100* 101  CO2 18* 16* 21* 27  GLUCOSE 175* 192* 191* 129*  BUN 31* 24* 24* 27*  CREATININE 1.39* 1.21 1.24 1.18  CALCIUM 9.1 8.8* 8.7* 8.9   Liver Function Tests:  Recent Labs Lab 02/05/15 2226 02/06/15 0201  AST 43* 39  ALT 32 30  ALKPHOS 32* 27*  BILITOT 0.3 0.8  PROT 7.1 6.6  ALBUMIN 3.6 3.2*   No results for input(s): LIPASE, AMYLASE in the last 168  hours. No results for input(s): AMMONIA in the last 168 hours. CBC:  Recent Labs Lab 02/05/15 1735 02/06/15 0201 02/08/15 0231  WBC 15.4* 19.0* 20.4*  HGB 14.1 13.3 12.6*  HCT 43.0 41.0 38.6*  MCV 90.3 91.1 91.0  PLT 330 262 254   Cardiac Enzymes:  Recent Labs Lab 02/05/15 2113 02/05/15 2226 02/06/15 0201 02/06/15 0820 02/07/15 0306   TROPONINI 0.03 0.06* 0.21* 1.01* 1.16*   BNP (last 3 results)  Recent Labs  02/05/15 1735  BNP 120.1*    ProBNP (last 3 results) No results for input(s): PROBNP in the last 8760 hours.  CBG:  Recent Labs Lab 02/07/15 0801 02/07/15 1149 02/07/15 1514 02/07/15 2123 02/08/15 0821  GLUCAP 214* 216* 126* 175* 128*    Recent Results (from the past 240 hour(s))  Culture, blood (routine x 2)     Status: None (Preliminary result)   Collection Time: 02/05/15  7:35 PM  Result Value Ref Range Status   Specimen Description BLOOD RIGHT ARM  Final   Special Requests BOTTLES DRAWN AEROBIC AND ANAEROBIC 10CC  Final   Culture NO GROWTH 2 DAYS  Final   Report Status PENDING  Incomplete  Culture, blood (routine x 2)     Status: None (Preliminary result)   Collection Time: 02/05/15  7:42 PM  Result Value Ref Range Status   Specimen Description BLOOD RIGHT HAND  Final   Special Requests BOTTLES DRAWN AEROBIC AND ANAEROBIC 10CC  Final   Culture NO GROWTH 2 DAYS  Final   Report Status PENDING  Incomplete  MRSA PCR Screening     Status: None   Collection Time: 02/05/15  9:59 PM  Result Value Ref Range Status   MRSA by PCR NEGATIVE NEGATIVE Final    Comment:        The GeneXpert MRSA Assay (FDA approved for NASAL specimens only), is one component of a comprehensive MRSA colonization surveillance program. It is not intended to diagnose MRSA infection nor to guide or monitor treatment for MRSA infections.      Studies: No results found.  Scheduled Meds: . arformoterol  15 mcg Nebulization BID  . atorvastatin  80 mg Oral Daily  . budesonide (PULMICORT) nebulizer solution  0.5 mg Nebulization BID  . docusate sodium  100 mg Oral BID  . fluticasone  2 spray Each Nare Daily  . guaiFENesin  1,200 mg Oral BID  . insulin aspart  0-20 Units Subcutaneous TID WC  . insulin aspart  0-5 Units Subcutaneous QHS  . ipratropium-albuterol  3 mL Nebulization Q6H  . isosorbide mononitrate   180 mg Oral Daily  . levofloxacin (LEVAQUIN) IV  750 mg Intravenous Q48H  . losartan  100 mg Oral Daily  . metoprolol  75 mg Oral BID  . mupirocin cream   Topical BID  . pantoprazole  40 mg Oral QAC breakfast  . predniSONE  50 mg Oral Q breakfast  . sodium chloride  3 mL Intravenous Q12H  . verapamil  240 mg Oral Daily  . warfarin  2.5 mg Oral ONCE-1800  . Warfarin - Pharmacist Dosing Inpatient   Does not apply q1800   Continuous Infusions:    Time spent: 25 minutes  Larin Depaoli  [facility] [email or web address], password [facility] 02/08/2015, 11:22 AM  LOS: 3 days

## 2015-02-08 NOTE — Progress Notes (Signed)
CRITICAL VALUE ALERT               Critical value received:  Lactic acid 4.1   Date of notification:  02/08/2015  Time of notification:  1522  Critical value read back:Yes.   4.1  Nurse who received alert:  Glenford Peers   MD notified (1st page):  vann  Time of first page:  1556 via text  MD notified (2nd page):  Time of second page:  Responding MD:       Time MD responded:

## 2015-02-08 NOTE — Discharge Instructions (Signed)

## 2015-02-08 NOTE — Progress Notes (Signed)
   Name: Henry [name] MRN: [id number] DOB: [birth year]    ADMISSION DATE:  02/05/2015 CONSULTATION DATE:  7/24  REFERRING MD :  Cruzita Lederer ([facility])   CHIEF COMPLAINT:  Dyspnea, rising lactate   BRIEF PATIENT DESCRIPTION:  74yo male with hx recurrent Severe COPD based on recurrent exacerbations and high eosinophils, PE on chronic anticoagulation (lupus anticoag POS), CAD s/p CABG, OSA presented 7/24 with SOB, chest congestion, cough and worsening chest pain.  Initial ER w/u revealed mild EKG changes, mild AKI and a surprisingly elevated lactate of 8.7->10.7.  Of note had negative troponin, hyPERtension and a metabolic acidosis. Pt admitted to SDU per [facility] and PCCM asked to consult.    SIGNIFICANT EVENTS    STUDIES:  2D echo 7/25>>> LVEF 60-65% , Grade 1 DD Xray RLE>>neg   SUBJECTIVE:  Feels much better today, cough improving. Intermittent chest and abdominal pain with coughing only. Had "normal" bowel movement this AM.   VITAL SIGNS: Temp:  [97.8 F (36.6 C)-98.6 F (37 C)] 98.6 F (37 C) (07/27 0800) Pulse Rate:  [54-97] 89 (07/27 0940) Resp:  [12-22] 13 (07/26 2333) BP: (107-149)/(59-89) 149/89 mmHg (07/27 0940) SpO2:  [91 %-99 %] 97 % (07/27 0219)  PHYSICAL EXAMINATION:  General:  Comfortable in chair HENT: NCAT EOMi PULM: no wheeze however there is significant rhonchi, good air movement CV: RRR, no mgr GI: BS+, soft, mildly distended, very mildly tender MSK: normal bulk and tone Neuro: A&Ox4, maew   Recent Labs Lab 02/06/15 0201 02/07/15 0306 02/08/15 0231  NA 137 133* 137  K 4.4 3.4* 4.0  CL 104 100* 101  CO2 16* 21* 27  BUN 24* 24* 27*  CREATININE 1.21 1.24 1.18  GLUCOSE 192* 191* 129*    Recent Labs Lab 02/05/15 1735 02/06/15 0201 02/08/15 0231  HGB 14.1 13.3 12.6*  HCT 43.0 41.0 38.6*  WBC 15.4* 19.0* 20.4*  PLT 330 262 254    7/24 CXR images reviewed> mild atelectasis R base, no infiltrate   ASSESSMENT / PLAN:  COPD exacerbation> slightly  improved; still has significant chest congestion  Chest pain and + troponin> likely demand ischemia from COPD exacerbation  Rising lactate --suspect B type lactic acidosis from metformin + hypoxemia from COPD exacerbation; no belly pain to suggest bowel ischemia. 7/27 > continues to slowly clear - 5.1  Leukocytosis > likely steroid induced  Leg wound   Constipation > Improved, small to moderate "regular" BM 7/27  Plan-  Continue to hold metformin Daily lactic acid Steroids tapered to prednisone 50mg  7/27 Continue Brovana + budesonide Continue xopenex prn Schedule duoneb Continue flutter valve Continue mucinex Continue incentive spirometry Continue colace/senna Hycodan for cough suppression CT abdomen should lactic acid increase or abdominal pain develop  Stable for transfer to floor from pulmonary standpoint  Georgann Housekeeper, AGACNP-BC [facility] Pulmonology/Critical Care Pager [phone] or [phone]  02/08/2015 10:40 AM

## 2015-02-08 NOTE — Progress Notes (Signed)
Patient Profile: 74 y/o male with h/o CAD s/p CABG with known occlusion of his 2 vein grafts on last LHC in 2012, COPD, CKD, HTN, prior PE now on Coumadin and DM, admitted for acute COPD exacerbation and chest pain.   Subjective: Feels better. No further chest pressure. Breathing improved.  Objective: Vital signs in last 24 hours: Temp:  [97.8 F (36.6 C)-98.6 F (37 C)] 98.6 F (37 C) (07/27 0800) Pulse Rate:  [54-97] 89 (07/27 0940) Resp:  [12-22] 13 (07/26 2333) BP: (107-149)/(59-89) 149/89 mmHg (07/27 0940) SpO2:  [91 %-99 %] 97 % (07/27 0219)    Intake/Output from previous day: 07/26 0701 - 07/27 0700 In: 941.1 [P.O.:720; I.V.:71.1; IV Piggyback:150] Out: 1700 [Urine:1700] Intake/Output this shift:    Medications Current Facility-Administered Medications  Medication Dose Route Frequency Provider Last Rate Last Dose  . acetaminophen (TYLENOL) tablet 650 mg  650 mg Oral Q6H PRN Delfina Redwood, MD      . arformoterol [facility]) nebulizer solution 15 mcg  15 mcg Nebulization BID Juanito Doom, MD   15 mcg at 02/07/15 2057  . atorvastatin (LIPITOR) tablet 80 mg  80 mg Oral Daily Caren Griffins, MD   80 mg at 02/08/15 0940  . budesonide (PULMICORT) nebulizer solution 0.5 mg  0.5 mg Nebulization BID Juanito Doom, MD   0.5 mg at 02/07/15 2057  . docusate sodium (COLACE) capsule 100 mg  100 mg Oral BID Juanito Doom, MD   100 mg at 02/07/15 2139  . fluticasone (FLONASE) 50 MCG/ACT nasal spray 2 spray  2 spray Each Nare Daily Costin Karlyne Greenspan, MD   2 spray at 02/08/15 0950  . guaiFENesin (MUCINEX) 12 hr tablet 1,200 mg  1,200 mg Oral BID Juanito Doom, MD   1,200 mg at 02/08/15 0940  . HYDROcodone-acetaminophen (NORCO/VICODIN) 5-325 MG per tablet 1 tablet  1 tablet Oral Q6H PRN Delfina Redwood, MD   1 tablet at 02/08/15 0941  . HYDROcodone-homatropine (HYCODAN) 5-1.5 MG/5ML syrup 5 mL  5 mL Oral Q6H PRN Juanito Doom, MD   5 mL at 02/07/15 2139  .  insulin aspart (novoLOG) injection 0-20 Units  0-20 Units Subcutaneous TID WC Delfina Redwood, MD   3 Units at 02/08/15 [phone]  . insulin aspart (novoLOG) injection 0-5 Units  0-5 Units Subcutaneous QHS Delfina Redwood, MD   3 Units at 02/06/15 2141  . ipratropium-albuterol (DUONEB) 0.5-2.5 (3) MG/3ML nebulizer solution 3 mL  3 mL Nebulization Q6H Juanito Doom, MD   3 mL at 02/08/15 0219  . isosorbide mononitrate (IMDUR) 24 hr tablet 180 mg  180 mg Oral Daily Caren Griffins, MD   180 mg at 02/08/15 0940  . levalbuterol (XOPENEX) nebulizer solution 0.63 mg  0.63 mg Nebulization Q2H PRN Juanito Doom, MD      . levofloxacin (LEVAQUIN) IVPB 750 mg  750 mg Intravenous Q48H Kendra P Hiatt, RPH   750 mg at 02/07/15 1947  . losartan (COZAAR) tablet 100 mg  100 mg Oral Daily Caren Griffins, MD   100 mg at 02/08/15 0940  . metoprolol tartrate (LOPRESSOR) tablet 75 mg  75 mg Oral BID Caren Griffins, MD   75 mg at 02/08/15 0940  . mupirocin cream (BACTROBAN) 2 %   Topical BID Delfina Redwood, MD      . pantoprazole (PROTONIX) EC tablet 40 mg  40 mg Oral QAC breakfast Delfina Redwood, MD  40 mg at 02/08/15 0941  . predniSONE (DELTASONE) tablet 50 mg  50 mg Oral Q breakfast Juanito Doom, MD   50 mg at 02/08/15 0942  . senna (SENOKOT) tablet 8.6 mg  1 tablet Oral Daily PRN Juanito Doom, MD   8.6 mg at 02/07/15 1057  . sodium chloride 0.9 % injection 3 mL  3 mL Intravenous Q12H Caren Griffins, MD   3 mL at 02/08/15 0947  . verapamil (CALAN-SR) CR tablet 240 mg  240 mg Oral Daily Caren Griffins, MD   240 mg at 02/08/15 0941  . warfarin (COUMADIN) tablet 2.5 mg  2.5 mg Oral ONCE-1800 Cecilio Asper [place], RPH      . Warfarin - Pharmacist Dosing Inpatient   Does not apply q1800 Rolla Flatten, RPH   0  at 02/06/15 1800  . zolpidem (AMBIEN) tablet 5 mg  5 mg Oral QHS PRN Delfina Redwood, MD   5 mg at 02/07/15 2139    PE: General appearance: alert, cooperative and no  distress Neck: no carotid bruit and no JVD Lungs: bilateral diffuse rhonchi  Heart: regular rate and rhythm, S1, S2 normal, no murmur, click, rub or gallop Extremities: no LEE Pulses: 2+ and symmetric Skin: warm and dry Neurologic: Grossly normal  Lab Results:   Recent Labs  02/05/15 1735 02/06/15 0201 02/08/15 0231  WBC 15.4* 19.0* 20.4*  HGB 14.1 13.3 12.6*  HCT 43.0 41.0 38.6*  PLT 330 262 254   BMET  Recent Labs  02/06/15 0201 02/07/15 0306 02/08/15 0231  NA 137 133* 137  K 4.4 3.4* 4.0  CL 104 100* 101  CO2 16* 21* 27  GLUCOSE 192* 191* 129*  BUN 24* 24* 27*  CREATININE 1.21 1.24 1.18  CALCIUM 8.8* 8.7* 8.9   PT/INR  Recent Labs  02/06/15 0201 02/07/15 0306 02/08/15 0231  LABPROT 29.4* 24.1* 24.5*  INR 2.84* 2.18* 2.23*   Cardiac Panel (last 3 results)  Recent Labs  02/06/15 0201 02/06/15 0820 02/07/15 0306  TROPONINI 0.21* 1.01* 1.16*    Studies/Results: 2D echo 02/06/15  Study Conclusions  - Left ventricle: The cavity size was normal. Wall thickness was increased in a pattern of mild LVH. Systolic function was normal. The estimated ejection fraction was in the range of 60% to 65%. Wall motion was normal; there were no regional wall motion abnormalities. Doppler parameters are consistent with abnormal left ventricular relaxation (grade 1 diastolic dysfunction). Doppler parameters are consistent with high ventricular filling pressure. - Mitral valve: Calcified annulus. - Left atrium: The atrium was moderately dilated.   Assessment/Plan  Principal Problem:   COPD exacerbation Active Problems:   Essential hypertension, benign   Dyslipidemia   Long term current use of anticoagulant therapy   Type 2 diabetes mellitus   CKD (chronic kidney disease), stage II   AKI (acute kidney injury)   Elevated lactic acid level   History of gastrointestinal bleeding   Lactic acidosis   Troponin level elevated   Atherosclerosis of  native coronary artery of native heart with other form of angina pectoris   1. Chest Pain:  slight troponin elevation 0.21-->1.01-->1.60. Felt to be likely secondary to demand ischemia in the setting of COPD exacerbation. 2D echo shows normal LV systolic function with EF of 60-65% and no WMA. He has had no recurrent chest pressure. Plan is to continue medical therapy and not pursue ischemic w/u at this time. Will reconsider cath if he develops angina uncontrolled with  medical therapy. Continue statin, BB, ARB and Imdur. Stable from a cardiac standpoint. Keep f/u with Dr. Domenic Polite in [place] on 8/30./16.      LOS: 3 days    Brittainy M. Ladoris Gene 02/08/2015 10:15 AM  Patient seen, examined. Available data reviewed. Agree with findings, assessment, and plan as outlined by Lyda Jester, PA-C. He remains stable from a cardiac perspective. Medications reviewed and include a high-intensity statin, beta-blocker, and ARB. INR therapeutic on long term warfarin. No antiplatelet Rx in context of long-term anticoagulation. FU with Dr Domenic Polite as scheduled next month - please call if any other inpatient issues come up. thx  Sherren Mocha, M.D. 02/08/2015 12:51 PM

## 2015-02-09 LAB — GLUCOSE, CAPILLARY
Glucose-Capillary: 130 mg/dL — ABNORMAL HIGH (ref 65–99)
Glucose-Capillary: 165 mg/dL — ABNORMAL HIGH (ref 65–99)

## 2015-02-09 LAB — PROTIME-INR
INR: 2.43 — AB (ref 0.00–1.49)
Prothrombin Time: 26.1 seconds — ABNORMAL HIGH (ref 11.6–15.2)

## 2015-02-09 LAB — LACTIC ACID, PLASMA: Lactic Acid, Venous: 3 mmol/L (ref 0.5–2.0)

## 2015-02-09 MED ORDER — WARFARIN SODIUM 5 MG PO TABS
5.0000 mg | ORAL_TABLET | Freq: Once | ORAL | Status: AC
  Administered 2015-02-09: 5 mg via ORAL
  Filled 2015-02-09: qty 1

## 2015-02-09 MED ORDER — HYDROCODONE-HOMATROPINE 5-1.5 MG/5ML PO SYRP: 5.0000 mL | ORAL_SOLUTION | Freq: Four times a day (QID) | ORAL | Status: DC | PRN

## 2015-02-09 MED ORDER — PREDNISONE 10 MG PO TABS: ORAL_TABLET | ORAL | Status: DC

## 2015-02-09 MED ORDER — LEVOFLOXACIN 750 MG PO TABS: 750.0000 mg | ORAL_TABLET | Freq: Every day | ORAL | Status: DC

## 2015-02-09 MED ORDER — SITAGLIPTIN PHOSPHATE 25 MG PO TABS: 25.0000 mg | ORAL_TABLET | Freq: Every day | ORAL | Status: AC

## 2015-02-09 MED ORDER — ATORVASTATIN CALCIUM 80 MG PO TABS: 80.0000 mg | ORAL_TABLET | Freq: Every day | ORAL | Status: AC

## 2015-02-09 NOTE — Discharge Summary (Signed)
Physician Discharge Summary  THURMOND HILDEBRAN LNL:[id number] DOB: 1941/05/23 DOA: 02/05/2015  PCP: Sherrie Mustache, MD  Admit date: 02/05/2015 Discharge date: 02/09/2015  Time spent: 35 minutes  Recommendations for Outpatient Follow-up:  1. Taper prednisone to 5 mg/day 2. Follow up with pulmonary in 2 weeks   Discharge Diagnoses:  Principal Problem:   COPD exacerbation Active Problems:   Essential hypertension, benign   Dyslipidemia   Long term current use of anticoagulant therapy   Type 2 diabetes mellitus   CKD (chronic kidney disease), stage II   AKI (acute kidney injury)   Elevated lactic acid level   History of gastrointestinal bleeding   Lactic acidosis   Troponin level elevated   Atherosclerosis of native coronary artery of native heart with other form of angina pectoris   Discharge Condition: improved  Diet recommendation: cardiac/diabetic  Filed Weights   02/05/15 1716 02/05/15 2157 02/09/15 0448  Weight: 83.915 kg (185 lb) 85.3 kg (188 lb 0.8 oz) 83.734 kg (184 lb 9.6 oz)    History of present illness:  Henry [name] is a 74 y.o. male has a past medical history significant for coronary artery disease status post CABG, hypertension, hyperlipidemia, type 2 diabetes, recurrent pulmonary embolism on chronic anticoagulation, lupus anticoagulate, COPD, presents to the emergency room with a chief complaint shortness of breath progressively getting worse over the last week. He endorses "chest congestion" as well as progressively worsening cough. He endorses yellowish sputum production which is also getting worse. He endorses chest pain with coughing. He endorses using his albuterol more often in the last few days. He denies any palpitations, denies any abdominal pain, nausea vomiting or diarrhea. He denies any blood in his stool or dark tarry stools. He has a history of a GI bleed in 2015 and at that time he was taken briefly off of anticoagulation now back on Coumadin,  and had no issues since. He denies any fever or chills. He denies any dysuria, he denies any lightheadedness or dizziness. In the emergency room, he is afebrile, his blood pressure is normal to high side, and he is satting well on 2 L nasal cannula. His blood work showed mild AKI, and a surprisingly elevated lactic acid to 8.7. An ABG and repeat lactic acid is pending. Chest x-ray was without evidence of pneumonia or edema. Of note, his right lower extremity wound for the past few weeks, this appeared after a small injury in his yard, and his been on doxycycline for a while. He was given stairways, levofloxacin as well as a door and treatment, and [facility] was asked for admission for COPD exacerbation  Hospital Course:  COPD exacerbation> significantly improved resume home inhaled Breo and Spiriva at discharge  prednisone taper per pulm: Take 40mg  po daily for 3 days, then take 30mg  po daily for 3 days, then take 20mg  po daily for two days, then take 10mg  po daily for 2 days  after completing pred taper we will have him take 5mg  prednisone indefinitely pulm follow up in 2 weeks > our office will call with an appointment continue hycodan prn as outpatient  Chest pain and + troponin> likely demand ischemia from COPD exacerbation echocardiogram (see below: EF 60-65% with no wall motion abnormality -seen by cards  Lactate elevation -- improved; suspect B type lactic acidosis from metformin + hypoxemia from COPD exacerbation; no belly pain to suggest bowel ischemia  hold metformin for life   Long term current use of anticoagulant therapy for history of  PE and positive lupus anticoagulation  Type 2 diabetes mellitus -unable to do metformin, start januvia  Procedures:    Consultations:  pulm  cards  Discharge Exam: Filed Vitals:   02/09/15 0448  BP: 150/84  Pulse: 80  Temp: 97.9 F (36.6 C)  Resp:     General: A+Ox3, NAD Cardiovascular: rrr Respiratory: no wheezing, moving more  air  Discharge Instructions   Discharge Instructions    Diet - low sodium heart healthy    Complete by:  As directed      Diet Carb Modified    Complete by:  As directed      Discharge instructions    Complete by:  As directed   Follow up with pulm in 2 weeks--- office to call with appointment Continue have INR monitored as before admission You will be on 5 mg indefinably once weaned to this     Increase activity slowly    Complete by:  As directed           Current Discharge Medication List    START taking these medications   Details  HYDROcodone-homatropine (HYCODAN) 5-1.5 MG/5ML syrup Take 5 mLs by mouth every 6 (six) hours as needed for cough. Qty: 120 mL, Refills: 0    levofloxacin (LEVAQUIN) 750 MG tablet Take 1 tablet (750 mg total) by mouth daily. Qty: 3 tablet, Refills: 0    sitaGLIPtin (JANUVIA) 25 MG tablet Take 1 tablet (25 mg total) by mouth daily. Qty: 30 tablet, Refills: 0      CONTINUE these medications which have CHANGED   Details  atorvastatin (LIPITOR) 80 MG tablet Take 1 tablet (80 mg total) by mouth daily. Qty: 30 tablet, Refills: 0    predniSONE (DELTASONE) 10 MG tablet 40mg  x 3 days, 30 mg x 3 days, 20 mg x 2 days, 10 mg x2 days then 5 mg daily Qty: 55 tablet, Refills: 0      CONTINUE these medications which have NOT CHANGED   Details  acetaminophen (TYLENOL) 325 MG tablet Take 650 mg by mouth every 4 (four) hours as needed for mild pain. For pain    albuterol (PROAIR HFA) 108 (90 BASE) MCG/ACT inhaler Inhale 2 puffs into the lungs every 6 (six) hours as needed for wheezing or shortness of breath.     chlorthalidone (HYGROTON) 25 MG tablet TAKE 1 TABLET BY MOUTH DAILY Qty: 30 tablet, Refills: 6    esomeprazole (NEXIUM) 40 MG capsule Take 40 mg by mouth daily before breakfast.    fish oil-omega-3 fatty acids 1000 MG capsule Take 2 g by mouth daily.     fluticasone (FLONASE) 50 MCG/ACT nasal spray Place 2 sprays into the nose daily.       Fluticasone Furoate-Vilanterol (BREO ELLIPTA) 100-25 MCG/INH AEPB Inhale 1 puff into the lungs daily.    HYDROcodone-acetaminophen (NORCO) 7.5-325 MG per tablet Take by mouth every 6 (six) hours as needed for moderate pain.     isosorbide mononitrate (IMDUR) 60 MG 24 hr tablet TAKE 3 TABLETS BY MOUTH ONCE DAILY Qty: 90 tablet, Refills: 3    losartan (COZAAR) 100 MG tablet Take 1 tablet (100 mg total) by mouth daily. Qty: 30 tablet, Refills: 6    metoprolol (LOPRESSOR) 50 MG tablet TAKE 1 AND 1/2 TABLETS BY MOUTH TWICE DAILY Qty: 90 tablet, Refills: 6    tiotropium (SPIRIVA) 18 MCG inhalation capsule Place 1 capsule (18 mcg total) into inhaler and inhale daily. Qty: 30 capsule, Refills: 5  verapamil (CALAN-SR) 240 MG CR tablet TAKE 1 TABLET (240 MG TOTAL) BY MOUTH DAILY. Qty: 30 tablet, Refills: 6    warfarin (COUMADIN) 5 MG tablet Take 5 mg by mouth as directed. MANAGED BY PMD, 2.5 mg on Monday.  5 mg Tues, Thurs, Friday, Sunday.  Skip Wednesday and Saturday.    zolpidem (AMBIEN) 10 MG tablet Take 10 mg by mouth at bedtime. Refills: 2    nitroGLYCERIN (NITROSTAT) 0.4 MG SL tablet Place 1 tablet (0.4 mg total) under the tongue every 5 (five) minutes x 3 doses as needed. For chest pain Qty: 25 tablet, Refills: 3      STOP taking these medications     doxycycline (VIBRAMYCIN) 50 MG capsule      metFORMIN (GLUCOPHAGE) 500 MG tablet        Allergies  Allergen Reactions  . Ace Inhibitors Cough    Cough   Follow-up Information    Follow up with Sherrie Mustache, MD In 1 week.   Specialty:  Family Medicine   Contact information:   [place] [id number] [phone]       Please follow up.   Why:  PT/INR 1 week--- on abx that may interfere with INR       The results of significant diagnostics from this hospitalization (including imaging, microbiology, ancillary and laboratory) are listed below for reference.    Significant Diagnostic  Studies: Dg Chest Port 1 View  02/05/2015   CLINICAL DATA:  Cough and dyspnea.  EXAM: PORTABLE CHEST - 1 VIEW  COMPARISON:  03/15/2014  FINDINGS: There is prior sternotomy and CABG. Heart size is normal, unchanged. The lungs are clear. The pulmonary vasculature is normal. There is mild unchanged right hemidiaphragm elevation. There is no large effusion.  IMPRESSION: No acute cardiopulmonary findings.   Electronically Signed   By: Andreas Newport M.D.   On: 02/05/2015 17:48   Dg Tibia/fibula Right Port  02/05/2015   CLINICAL DATA:  Wound on the lateral side of the right lower leg.  EXAM: PORTABLE RIGHT TIBIA AND FIBULA - 2 VIEW  COMPARISON:  None.  FINDINGS: Negative for fracture, dislocation or radiopaque foreign body. There is no bone lesion or bony destruction.  IMPRESSION: Negative for significant abnormality.   Electronically Signed   By: Andreas Newport M.D.   On: 02/05/2015 20:12    Microbiology: Recent Results (from the past 240 hour(s))  Culture, blood (routine x 2)     Status: None (Preliminary result)   Collection Time: 02/05/15  7:35 PM  Result Value Ref Range Status   Specimen Description BLOOD RIGHT ARM  Final   Special Requests BOTTLES DRAWN AEROBIC AND ANAEROBIC 10CC  Final   Culture NO GROWTH 4 DAYS  Final   Report Status PENDING  Incomplete  Culture, blood (routine x 2)     Status: None (Preliminary result)   Collection Time: 02/05/15  7:42 PM  Result Value Ref Range Status   Specimen Description BLOOD RIGHT HAND  Final   Special Requests BOTTLES DRAWN AEROBIC AND ANAEROBIC 10CC  Final   Culture NO GROWTH 4 DAYS  Final   Report Status PENDING  Incomplete  MRSA PCR Screening     Status: None   Collection Time: 02/05/15  9:59 PM  Result Value Ref Range Status   MRSA by PCR NEGATIVE NEGATIVE Final    Comment:        The GeneXpert MRSA Assay (FDA approved for NASAL specimens only), is one component  of a comprehensive MRSA colonization surveillance program. It is  not intended to diagnose MRSA infection nor to guide or monitor treatment for MRSA infections.      Labs: Basic Metabolic Panel:  Recent Labs Lab 02/05/15 1735 02/06/15 0201 02/07/15 0306 02/08/15 0231  NA 137 137 133* 137  K 4.4 4.4 3.4* 4.0  CL 101 104 100* 101  CO2 18* 16* 21* 27  GLUCOSE 175* 192* 191* 129*  BUN 31* 24* 24* 27*  CREATININE 1.39* 1.21 1.24 1.18  CALCIUM 9.1 8.8* 8.7* 8.9   Liver Function Tests:  Recent Labs Lab 02/05/15 2226 02/06/15 0201  AST 43* 39  ALT 32 30  ALKPHOS 32* 27*  BILITOT 0.3 0.8  PROT 7.1 6.6  ALBUMIN 3.6 3.2*   No results for input(s): LIPASE, AMYLASE in the last 168 hours. No results for input(s): AMMONIA in the last 168 hours. CBC:  Recent Labs Lab 02/05/15 1735 02/06/15 0201 02/08/15 0231  WBC 15.4* 19.0* 20.4*  HGB 14.1 13.3 12.6*  HCT 43.0 41.0 38.6*  MCV 90.3 91.1 91.0  PLT 330 262 254   Cardiac Enzymes:  Recent Labs Lab 02/05/15 2113 02/05/15 2226 02/06/15 0201 02/06/15 0820 02/07/15 0306  TROPONINI 0.03 0.06* 0.21* 1.01* 1.16*   BNP: BNP (last 3 results)  Recent Labs  02/05/15 1735  BNP 120.1*    ProBNP (last 3 results) No results for input(s): PROBNP in the last 8760 hours.  CBG:  Recent Labs Lab 02/08/15 0821 02/08/15 1744 02/08/15 2053 02/09/15 0733 02/09/15 1159  GLUCAP 128* 278* 200* 130* 165*       Signed:  Delia Sitar  [facility] 02/09/2015, 2:59 PM

## 2015-02-09 NOTE — Progress Notes (Signed)
Inpatient Diabetes Program Recommendations  AACE/ADA: New Consensus Statement on Inpatient Glycemic Control (2013)  Target Ranges:  Prepandial:   less than 140 mg/dL      Peak postprandial:   less than 180 mg/dL (1-2 hours)      Critically ill patients:  140 - 180 mg/dL   Inpatient Diabetes Program Recommendations Insulin - Meal Coverage: add Novolog 4 units TID with meals per Glycemic Control Order-set Thank you  Raoul Pitch BSN, RN,CDE Inpatient Diabetes Coordinator [phone] (team pager)

## 2015-02-09 NOTE — Progress Notes (Signed)
   Name: Henry [name] MRN: [id number] DOB: 1940/12/14    ADMISSION DATE:  02/05/2015 CONSULTATION DATE:  7/24  REFERRING MD :  Cruzita Lederer ([facility])   CHIEF COMPLAINT:  Dyspnea, rising lactate   BRIEF PATIENT DESCRIPTION:  74yo male with hx recurrent Severe COPD based on recurrent exacerbations and high eosinophils, PE on chronic anticoagulation (lupus anticoag POS), CAD s/p CABG, OSA presented 7/24 with SOB, chest congestion, cough and worsening chest pain.  Initial ER w/u revealed mild EKG changes, mild AKI and a surprisingly elevated lactate of 8.7->10.7.  Of note had negative troponin, hypertension and a metabolic acidosis. Pt admitted to SDU per [facility] and PCCM asked to consult.    SIGNIFICANT EVENTS    STUDIES:  2D echo 7/24>>> Xray RLE>>neg   SUBJECTIVE:  Breathing better today, has been on room air, walking around Denies abdominal pain, he is eating OK  VITAL SIGNS: Temp:  [97.9 F (36.6 C)-99 F (37.2 C)] 97.9 F (36.6 C) (07/28 0448) Pulse Rate:  [31-91] 80 (07/28 0448) Resp:  [20-22] 20 (07/27 1815) BP: (123-153)/(62-84) 150/84 mmHg (07/28 0448) SpO2:  [94 %-100 %] 98 % (07/28 0725) Weight:  [184 lb 9.6 oz (83.734 kg)] 184 lb 9.6 oz (83.734 kg) (07/28 0448)  PHYSICAL EXAMINATION: General:  Comfortable sitting up in chair HENT: NCAT EOMi PULM: CTA B today without wheeze CV: RRR, no mgr GI: BS+, soft, mildly distended, small umbilical hernia non-tender MSK: normal bulk and tone Neuro: A&Ox4, maew   Recent Labs Lab 02/06/15 0201 02/07/15 0306 02/08/15 0231  NA 137 133* 137  K 4.4 3.4* 4.0  CL 104 100* 101  CO2 16* 21* 27  BUN 24* 24* 27*  CREATININE 1.21 1.24 1.18  GLUCOSE 192* 191* 129*    Recent Labs Lab 02/05/15 1735 02/06/15 0201 02/08/15 0231  HGB 14.1 13.3 12.6*  HCT 43.0 41.0 38.6*  WBC 15.4* 19.0* 20.4*  PLT 330 262 254    7/24 CXR images reviewed> mild atelectasis R base, no infiltrate   ASSESSMENT / PLAN:  COPD exacerbation>  significantly improved; still has significant chest congestion > resume home inhaled Breo and Spiriva at discharge > prednisone taper: Take 40mg  po daily for 3 days, then take 30mg  po daily for 3 days, then take 20mg  po daily for two days, then take 10mg  po daily for 2 days > after completing pred taper we will have him take 5mg  prednisone indefinitely > he will follow up with [place] in the office in 2 weeks > our office will call with an appointment > continue hycodan prn as outpatient  Chest pain and + troponin> likely demand ischemia from COPD exacerbation  Lactate elevation -- improved; suspect B type lactic acidosis from metformin + hypoxemia from COPD exacerbation; no belly pain to suggest bowel ischemia > hold metformin for life > I instructed him today to let us know if he has belly pain   Updated pt and wife at bedside.    Roselie Awkward, MD [facility] Pager: [phone] Cell: [phone] After 3pm or if no response, call [phone]

## 2015-02-09 NOTE — Evaluation (Signed)
Physical Therapy Evaluation Patient Details Name: Henry [name] MRN: [id number] DOB: 05-Feb-1941 Today's Date: 02/09/2015   History of Present Illness  pt is a 74 y/o male with COPD, h/o PE, and DM admitted with COPD exacerbation and lactic acidosis.  He developed CP and worsening ST depression in the ED  Clinical Impression  Pt is at or close to baseline functioning and should be safe at home with available assist. There are no further acute PT needs.  Will sign off at this time.     Follow Up Recommendations No PT follow up    Equipment Recommendations  None recommended by PT    Recommendations for Other Services       Precautions / Restrictions        Mobility  Bed Mobility Overal bed mobility: Modified Independent                Transfers Overall transfer level: Independent                  Ambulation/Gait Ambulation/Gait assistance: Supervision Ambulation Distance (Feet): 350 Feet Assistive device: None Gait Pattern/deviations: Step-through pattern Gait velocity: able to increase speed by significant amount Gait velocity interpretation: >2.62 ft/sec, indicative of independent community ambulator General Gait Details: steady even with balance challenge, but with mild SOB  Stairs Stairs: Yes Stairs assistance: Modified independent (Device/Increase time) Stair Management: One rail Right;Alternating pattern;Forwards Number of Stairs: 5 General stair comments: safe with rail  Wheelchair Mobility    Modified Rankin (Stroke Patients Only)       Balance Overall balance assessment: Needs assistance Sitting-balance support: No upper extremity supported Sitting balance-Leahy Scale: Good     Standing balance support: No upper extremity supported Standing balance-Leahy Scale: Good             [place] - Eyes Closed: 30 High level balance activites: Backward walking;Turns;Direction changes;Sudden stops;Head turns High Level Balance Comments:  no overt deviation Standardized Balance Assessment Standardized Balance Assessment : Dynamic Gait Index   Dynamic Gait Index Level Surface: Normal Change in Gait Speed: Normal Gait with Horizontal Head Turns: Normal Gait with Vertical Head Turns: Normal Gait and Pivot Turn: Normal Step Over Obstacle: Mild Impairment Step Around Obstacles: Mild Impairment Steps: Mild Impairment Total Score: 21       Pertinent Vitals/Pain Pain Assessment: No/denies pain    Home Living Family/patient expects to be discharged to:: Private residence Living Arrangements: Spouse/significant other Available Help at Discharge: Family;Available 24 hours/day Type of Home: House Home Access: Stairs to enter Entrance Stairs-Rails: Psychiatric nurse of Steps: 4 Home Layout: One level        Prior Function Level of Independence: Independent               Hand Dominance        Extremity/Trunk Assessment   Upper Extremity Assessment: Overall WFL for tasks assessed           Lower Extremity Assessment: Overall WFL for tasks assessed;Generalized weakness (but performs better than his MMT suggests)      Cervical / Trunk Assessment: Normal  Communication   Communication: No difficulties  Cognition Arousal/Alertness: Awake/alert Behavior During Therapy: WFL for tasks assessed/performed Overall Cognitive Status: Within Functional Limits for tasks assessed                      General Comments General comments (skin integrity, edema, etc.): Sats on RA with ambulation 98/99% and EHR nohigher than 97 bpm  Exercises        Assessment/Plan    PT Assessment Patent does not need any further PT services  PT Diagnosis     PT Problem List    PT Treatment Interventions     PT Goals (Current goals can be found in the Care Plan section) Acute Rehab PT Goals PT Goal Formulation: All assessment and education complete, DC therapy    Frequency     Barriers to  discharge        Co-evaluation               End of Session   Activity Tolerance: Patient tolerated treatment well Patient left: in bed;Other (comment) (sitting EOB) Nurse Communication: Mobility status         Time: 5409-8119 PT Time Calculation (min) (ACUTE ONLY): 15 min   Charges:   PT Evaluation $Initial PT Evaluation Tier I: 1 Procedure     PT G Codes:        Kirsten Spearing, Tessie Fass 02/09/2015, 2:35 PM  02/09/2015  Donnella Sham, [place] [phone]  (pager)

## 2015-02-09 NOTE — Progress Notes (Signed)
Paged on-call for Critical Lactic acid of 3.0, down from 4.1

## 2015-02-09 NOTE — Progress Notes (Signed)
Pt discharged home with wife.  Reviewed discharge instruction and education, all question answered.  Assessment unchanged from earlier.

## 2015-02-09 NOTE — Progress Notes (Signed)
Pt's SpO2 at 98/99% and EHR no more than 97 bpm during ambulation on room air. Over a 10 minute period. 02/09/2015  Donnella Sham, [place] [phone]  (pager)

## 2015-02-09 NOTE — Progress Notes (Signed)
ANTICOAGULATION CONSULT NOTE - Follow Up Consult  Pharmacy Consult for Coumadin Indication: Hx of recurrent PE  Allergies  Allergen Reactions  . Ace Inhibitors Cough    Cough    Patient Measurements: Height: 5\' 8"  (172.7 cm) Weight: 184 lb 9.6 oz (83.734 kg) IBW/kg (Calculated) : 68.4  Vital Signs: Temp: 97.9 F (36.6 C) (07/28 0448) Temp Source: Oral (07/28 0448) BP: 150/84 mmHg (07/28 0448) Pulse Rate: 80 (07/28 0448)  Labs:  Recent Labs  02/07/15 0306 02/08/15 0231 02/09/15 0438  HGB  --  12.6*  --   HCT  --  38.6*  --   PLT  --  254  --   LABPROT 24.1* 24.5* 26.1*  INR 2.18* 2.23* 2.43*  CREATININE 1.24 1.18  --   TROPONINI 1.16*  --   --     Estimated Creatinine Clearance: 57 mL/min (by C-G formula based on Cr of 1.18).  Assessment: 34 yoM who [facility] with SOB/cough concerning for PNA. On warfarin PTA for hx recurrent PE, admitted with a therapeutic INR of 3.06. Noted hx of GIB in '15 and taken off of warfarin briefly, then restarted - no issues since restarted.  Hgb 12.6, Plt wnl, INR remains therapeutic at 2.43. Pt also receiving levaquin.  PTA Coumadin dose is 5mg  daily exc 2.5mg  on Mon, and none Wed/Sat  Goal of Therapy:  INR 2-3 Monitor platelets by anticoagulation protocol: Yes   Plan:  Give coumadin 5 mg PO x 1 tonight Monitor daily INR, CBC, s/s of bleed F/U duration of abx  Joya San, PharmD Clinical Pharmacist Pager # [phone] 02/09/2015 10:40 AM

## 2015-02-10 ENCOUNTER — Telehealth: Payer: Self-pay

## 2015-02-10 LAB — CULTURE, BLOOD (ROUTINE X 2)
CULTURE: NO GROWTH
Culture: NO GROWTH

## 2015-02-10 NOTE — Telephone Encounter (Signed)
Dr. Lake Bells, you or tammy do not have available appts in the next 2 weeks.  Ok to [place], or schedule next available?  Thanks!

## 2015-02-10 NOTE — Telephone Encounter (Signed)
-----   Message from Juanito Doom, MD sent at 02/09/2015 12:09 PM EDT ----- Hi Please make a f/u appointment for him with either me or Tammy in 2 weeks. Thanks [employer]

## 2015-02-12 NOTE — Telephone Encounter (Signed)
OK to overbook?

## 2015-02-13 NOTE — Telephone Encounter (Signed)
Pt scheduled for 8/10 at 11:00 (an opening came up in schedule) Forwarding to BQ as FYI.  Nothing further needed at this time.

## 2015-02-22 ENCOUNTER — Ambulatory Visit (INDEPENDENT_AMBULATORY_CARE_PROVIDER_SITE_OTHER): Payer: Medicare Other | Admitting: Pulmonary Disease

## 2015-02-22 ENCOUNTER — Encounter: Payer: Self-pay | Admitting: Pulmonary Disease

## 2015-02-22 VITALS — BP 136/78 | HR 73 | Ht 68.0 in | Wt 194.0 lb

## 2015-02-22 DIAGNOSIS — J449 Chronic obstructive pulmonary disease, unspecified: Secondary | ICD-10-CM | POA: Diagnosis not present

## 2015-02-22 MED ORDER — AMOXICILLIN 500 MG PO CAPS
ORAL_CAPSULE | ORAL | Status: DC
Start: 2015-02-22 — End: 2015-08-30

## 2015-02-22 NOTE — Patient Instructions (Signed)
Take amoxicillin daily for the first week of the month Keep taking your inhalers as you're doing We will see you back in 2 months or sooner if needed

## 2015-02-22 NOTE — Progress Notes (Signed)
Subjective:    Patient ID: Henry [name], male    DOB: [birth year], 74 y.o.   MRN: [id number]  Synopsis: GOLD Grade C COPD 02/2014 Spirometry > airflow obstruction FEV1 65% pred Serum IgE from November 2015 was elevated at 128 Serum Rast testing was negative across the board   HPI Chief Complaint  Patient presents with  . Follow-up    pt recently hospitalized for COPD exacerbation.  pt currently has no breathing complaints.  Pt had skin testing with CY last month.     Heidi says that his breathing is a lot better.  Not having too much trouble at home. He has really been struggling with a leg wound which is really bothering him.  His PCP referred to a wound center in [place]. He has been having some leg swelling as well.  He has been having some bruising in his leg. He is currently taking 5mg  prednisone a day. He is still taking wafarin, INR was 3.6 last week.      Past Medical History  Diagnosis Date  . Coronary atherosclerosis of native coronary artery     a. NSTEMI 2005 s/p CABG (LIMA - LAD, SVG - OM, SVG - PDA). b. DES to Cx 02/2005. c. NSTEMI 07/2010 due to occ Cx - med rx. Known occluded vein grafts.  . Essential hypertension, benign   . Type 2 diabetes mellitus   . Dyslipidemia   . ACE inhibitor intolerance   . Asthma   . Recurrent pulmonary embolism     a. 01/2011. b. Bilateral PE 11/2011 - + lupus anticoagulant preliminary testing. Felt to require lifelong Coumadin.  . Pulmonary nodule     RML by CT 11/2011, stable compared to 2012.  Marland Kitchen Cholelithiasis   . Pulmonary HTN     Echo 11/2011 at time of PE. PA pressure not assessed on 03/2012 echo but RV was back to normal.  . Lupus anticoagulant positive   . GERD (gastroesophageal reflux disease)   . COPD (chronic obstructive pulmonary disease)   . OSA (obstructive sleep apnea)      Review of Systems  Constitutional: Negative for fever, chills and fatigue.  HENT: Negative for postnasal drip, rhinorrhea and sinus  pressure.   Respiratory: Negative for cough, shortness of breath and wheezing.   Cardiovascular: Negative for chest pain, palpitations and leg swelling.       Objective:   Physical Exam  Filed Vitals:   02/22/15 1105  BP: 136/78  Pulse: 73  Height: 5\' 8"  (1.727 m)  Weight: 194 lb (87.998 kg)  SpO2: 97%  RA  Gen: no acute distress HEENT: NCAT, EOMi, OP clear, PULM: CTA B today CV: RRR, no mgr, no JVD AB: BS+, soft, nontender,  Ext: warm, no edema, no clubbing, no cyanosis Derm: Right leg wound well dressed, no surrounding erythema Neuro: A&Ox4, MAEW        Assessment & Plan:   COPD, moderate Henry [name] has really struggled from severe COPD symptoms in the last 6 months. Most recently he was hospitalized with a severe exacerbation of COPD which led to a non-ST elevation MI. This is in the background of him having multiple outpatient exacerbations treated this year. He has high serum eosinophils which are associated with a severe form of COPD with recurrent exacerbations. We have looked for allergic causes and we have tried multiple therapies to prevent these exacerbations. He was intolerant of roflumilast. At this point we really don't have much choice other than to treat  him with a low dose of prednisone to try to prevent the exacerbations. We have to balance this with his wound healing and diabetic issues.  Plan: Continue Symbicort and Spiriva Low-dose prednisone 5 mg daily for now Amoxicillin daily first week of the month to try to prevent exacerbations Follow-up 2 months or sooner if needed    Updated Medication List Outpatient Encounter Prescriptions as of 02/22/2015  Medication Sig  . acetaminophen (TYLENOL) 325 MG tablet Take 650 mg by mouth every 4 (four) hours as needed for mild pain. For pain  . atorvastatin (LIPITOR) 80 MG tablet Take 1 tablet (80 mg total) by mouth daily.  . chlorthalidone (HYGROTON) 25 MG tablet TAKE 1 TABLET BY MOUTH DAILY  . esomeprazole  (NEXIUM) 40 MG capsule Take 40 mg by mouth daily before breakfast.  . fish oil-omega-3 fatty acids 1000 MG capsule Take 2 g by mouth daily.   . fluticasone (FLONASE) 50 MCG/ACT nasal spray Place 2 sprays into the nose daily.    . Fluticasone Furoate-Vilanterol (BREO ELLIPTA) 100-25 MCG/INH AEPB Inhale 1 puff into the lungs daily.  Marland Kitchen HYDROcodone-acetaminophen (NORCO) 7.5-325 MG per tablet Take by mouth every 6 (six) hours as needed for moderate pain.   Marland Kitchen HYDROcodone-homatropine (HYCODAN) 5-1.5 MG/5ML syrup Take 5 mLs by mouth every 6 (six) hours as needed for cough.  . isosorbide mononitrate (IMDUR) 60 MG 24 hr tablet TAKE 3 TABLETS BY MOUTH ONCE DAILY  . losartan (COZAAR) 100 MG tablet Take 1 tablet (100 mg total) by mouth daily.  . metoprolol (LOPRESSOR) 50 MG tablet TAKE 1 AND 1/2 TABLETS BY MOUTH TWICE DAILY  . mupirocin ointment (BACTROBAN) 2 % Place 1 application into the nose daily.  . nitroGLYCERIN (NITROSTAT) 0.4 MG SL tablet Place 1 tablet (0.4 mg total) under the tongue every 5 (five) minutes x 3 doses as needed. For chest pain  . predniSONE (DELTASONE) 10 MG tablet 40mg  x 3 days, 30 mg x 3 days, 20 mg x 2 days, 10 mg x2 days then 5 mg daily  . sitaGLIPtin (JANUVIA) 25 MG tablet Take 1 tablet (25 mg total) by mouth daily.  Marland Kitchen tiotropium (SPIRIVA) 18 MCG inhalation capsule Place 1 capsule (18 mcg total) into inhaler and inhale daily.  . verapamil (CALAN-SR) 240 MG CR tablet TAKE 1 TABLET (240 MG TOTAL) BY MOUTH DAILY.  Marland Kitchen warfarin (COUMADIN) 5 MG tablet Take 5 mg by mouth as directed. MANAGED BY PMD, 2.5 mg on Monday.  5 mg Tues, Thurs, Friday, Sunday.  Skip Wednesday and Saturday.  . zolpidem (AMBIEN) 10 MG tablet Take 10 mg by mouth at bedtime.  Marland Kitchen albuterol (PROAIR HFA) 108 (90 BASE) MCG/ACT inhaler Inhale 2 puffs into the lungs every 6 (six) hours as needed for wheezing or shortness of breath.   Marland Kitchen amoxicillin (AMOXIL) 500 MG capsule Take one pill daily the first week of the month  .  [DISCONTINUED] levofloxacin (LEVAQUIN) 750 MG tablet Take 1 tablet (750 mg total) by mouth daily. (Patient not taking: Reported on 02/22/2015)   No facility-administered encounter medications on file as of 02/22/2015.

## 2015-02-22 NOTE — Assessment & Plan Note (Signed)
Henry [name] has really struggled from severe COPD symptoms in the last 6 months. Most recently he was hospitalized with a severe exacerbation of COPD which led to a non-ST elevation MI. This is in the background of him having multiple outpatient exacerbations treated this year. He has high serum eosinophils which are associated with a severe form of COPD with recurrent exacerbations. We have looked for allergic causes and we have tried multiple therapies to prevent these exacerbations. He was intolerant of roflumilast. At this point we really don't have much choice other than to treat him with a low dose of prednisone to try to prevent the exacerbations. We have to balance this with his wound healing and diabetic issues.  Plan: Continue Symbicort and Spiriva Low-dose prednisone 5 mg daily for now Amoxicillin daily first week of the month to try to prevent exacerbations Follow-up 2 months or sooner if needed

## 2015-03-14 ENCOUNTER — Encounter: Payer: Self-pay | Admitting: Cardiology

## 2015-03-14 ENCOUNTER — Ambulatory Visit (INDEPENDENT_AMBULATORY_CARE_PROVIDER_SITE_OTHER): Payer: Medicare Other | Admitting: Cardiology

## 2015-03-14 VITALS — BP 122/69 | HR 68 | Ht 68.0 in | Wt 194.0 lb

## 2015-03-14 DIAGNOSIS — J439 Emphysema, unspecified: Secondary | ICD-10-CM | POA: Diagnosis not present

## 2015-03-14 DIAGNOSIS — I2510 Atherosclerotic heart disease of native coronary artery without angina pectoris: Secondary | ICD-10-CM | POA: Diagnosis not present

## 2015-03-14 DIAGNOSIS — R790 Abnormal level of blood mineral: Secondary | ICD-10-CM | POA: Diagnosis not present

## 2015-03-14 DIAGNOSIS — R760 Raised antibody titer: Secondary | ICD-10-CM

## 2015-03-14 NOTE — Progress Notes (Signed)
Cardiology Office Note  Date: 03/14/2015   ID: Banks, Chaikin January 01, 1941, MRN [id number]  PCP: Sherrie Mustache, MD  Primary Cardiologist: Rozann Lesches, MD   Chief Complaint  Patient presents with  . Coronary Artery Disease  . Hypercoagulable state    History of Present Illness: Henry [name] is a medically complex 74 y.o. [name] last seen in April. Interval records reviewed including recent hospitalization in July with COPD exacerbation. He did have evidence of demand ischemia with troponin I level up to 1.16, no decline in LVEF by follow-up echocardiogram however, and he was managed medically.  Interval visit noted with Dr. Lake Bells in the Pulmonary division. He has fairly severe COPD, remains on low-dose steroids.  He continues on Coumadin, followed by Dr. Edrick Oh. Reports no spontaneous bleeding problems.  He is having trouble with a slowly healing wound on his right leg. He is followed at a wound clinic in [place]. Also having some swelling on that side.  Since hospital discharge, he denies any recurring chest pain, no nitroglycerin use. We reviewed his cardiac medications which are unchanged.   Past Medical History  Diagnosis Date  . Coronary atherosclerosis of native coronary artery     a. NSTEMI 2005 s/p CABG (LIMA - LAD, SVG - OM, SVG - PDA). b. DES to Cx 02/2005. c. NSTEMI 07/2010 due to occ Cx - med rx. Known occluded vein grafts.  . Essential hypertension, benign   . Type 2 diabetes mellitus   . Dyslipidemia   . ACE inhibitor intolerance   . Asthma   . Recurrent pulmonary embolism     a. 01/2011. b. Bilateral PE 11/2011 - + lupus anticoagulant preliminary testing. Felt to require lifelong Coumadin.  . Pulmonary nodule     RML by CT 11/2011, stable compared to 2012.  Marland Kitchen Cholelithiasis   . Pulmonary HTN     Echo 11/2011 at time of PE. PA pressure not assessed on 03/2012 echo but RV was back to normal.  . Lupus anticoagulant positive   . GERD  (gastroesophageal reflux disease)   . COPD (chronic obstructive pulmonary disease)   . OSA (obstructive sleep apnea)     Past Surgical History  Procedure Laterality Date  . Coronary artery bypass graft  2005     LIMA to LAD, SVG to OM, SVG to RCA  . L4-l5 laminectomy    . Colonoscopy    . Esophagogastroduodenoscopy N/A 12/19/2013    Procedure: ESOPHAGOGASTRODUODENOSCOPY (EGD);  Surgeon: Lafayette Dragon, MD;  Location: [facility] ENDOSCOPY;  Service: Endoscopy;  Laterality: N/A;  . Kidney stone removed      Current Outpatient Prescriptions  Medication Sig Dispense Refill  . acetaminophen (TYLENOL) 325 MG tablet Take 650 mg by mouth every 4 (four) hours as needed for mild pain. For pain    . albuterol (PROAIR HFA) 108 (90 BASE) MCG/ACT inhaler Inhale 2 puffs into the lungs every 6 (six) hours as needed for wheezing or shortness of breath.     Marland Kitchen amoxicillin (AMOXIL) 500 MG capsule Take one pill daily the first week of the month 7 capsule 5  . atorvastatin (LIPITOR) 80 MG tablet Take 1 tablet (80 mg total) by mouth daily. 30 tablet 0  . chlorthalidone (HYGROTON) 25 MG tablet TAKE 1 TABLET BY MOUTH DAILY 30 tablet 6  . Coenzyme Q10 150 MG CAPS Take 1 capsule by mouth daily.    Marland Kitchen dexlansoprazole (DEXILANT) 60 MG capsule Take 60 mg by mouth as needed.    Marland Kitchen  esomeprazole (NEXIUM) 40 MG capsule Take 40 mg by mouth every morning.     . fish oil-omega-3 fatty acids 1000 MG capsule Take 2 g by mouth daily.     . fluticasone (FLONASE) 50 MCG/ACT nasal spray Place 2 sprays into the nose daily.      . Fluticasone Furoate-Vilanterol (BREO ELLIPTA) 100-25 MCG/INH AEPB Inhale 1 puff into the lungs daily.    Marland Kitchen HYDROcodone-acetaminophen (NORCO/VICODIN) 5-325 MG per tablet Take 1 tablet by mouth every 6 (six) hours as needed for moderate pain.    Marland Kitchen HYDROcodone-homatropine (HYCODAN) 5-1.5 MG/5ML syrup Take 5 mLs by mouth every 6 (six) hours as needed for cough. 120 mL 0  . isosorbide mononitrate (IMDUR) 60 MG 24 hr  tablet TAKE 3 TABLETS BY MOUTH ONCE DAILY 90 tablet 3  . losartan (COZAAR) 100 MG tablet Take 1 tablet (100 mg total) by mouth daily. 30 tablet 6  . metoprolol (LOPRESSOR) 50 MG tablet TAKE 1 AND 1/2 TABLETS BY MOUTH TWICE DAILY 90 tablet 6  . montelukast (SINGULAIR) 10 MG tablet Take 10 mg by mouth at bedtime.    . nitroGLYCERIN (NITROSTAT) 0.4 MG SL tablet Place 1 tablet (0.4 mg total) under the tongue every 5 (five) minutes x 3 doses as needed. For chest pain 25 tablet 3  . omeprazole (PRILOSEC) 40 MG capsule Take 40 mg by mouth every evening.    . predniSONE (DELTASONE) 10 MG tablet 40mg  x 3 days, 30 mg x 3 days, 20 mg x 2 days, 10 mg x2 days then 5 mg daily 55 tablet 0  . sitaGLIPtin (JANUVIA) 25 MG tablet Take 1 tablet (25 mg total) by mouth daily. 30 tablet 0  . tiotropium (SPIRIVA) 18 MCG inhalation capsule Place 1 capsule (18 mcg total) into inhaler and inhale daily. 30 capsule 5  . verapamil (CALAN-SR) 240 MG CR tablet TAKE 1 TABLET (240 MG TOTAL) BY MOUTH DAILY. 30 tablet 6  . warfarin (COUMADIN) 5 MG tablet Take 5 mg by mouth as directed. MANAGED BY PMD, 2.5 mg on Monday.  5 mg Tues, Thurs, Friday, Sunday.  Skip Wednesday and Saturday.    . zolpidem (AMBIEN) 10 MG tablet Take 10 mg by mouth at bedtime.  2   No current facility-administered medications for this visit.    Allergies:  Ace inhibitors   Social History: The patient  reports that he quit smoking about 42 years ago. His smoking use included Cigarettes. He started smoking about 69 years ago. He has a 9 pack-year smoking history. His smokeless tobacco use includes Chew. He reports that he does not drink alcohol or use illicit drugs.   ROS:  Please see the history of present illness. Otherwise, complete review of systems is positive for right leg soreness and swelling.  All other systems are reviewed and negative.   Physical Exam: VS:  BP 122/69 mmHg  Pulse 68  Ht 5\' 8"  (1.727 m)  Wt 194 lb (87.998 kg)  BMI 29.50 kg/m2   SpO2 96%, BMI Body mass index is 29.5 kg/(m^2).  Wt Readings from Last 3 Encounters:  03/14/15 194 lb (87.998 kg)  02/22/15 194 lb (87.998 kg)  02/09/15 184 lb 9.6 oz (83.734 kg)     Appears comfortable at rest.  HEENT: Conjunctiva and lids normal, oropharynx clear with poor dentition.  Neck: Supple, no elevated JVP or carotid bruits, no thyromegaly.  Lungs: Decreased breath sounds, no wheezing, nonlabored breathing at rest.  Cardiac: Regular rate and rhythm, no S3  or significant systolic murmur, no pericardial rub.  Abdomen: Soft, nontender, bowel sounds present.  Extremities: Right leg and compression dressing, edema noted up to knee and lower thigh, distal pulses 1+.  Skin: Warm and dry.  Musculoskeletal: No kyphosis.  Neuropsychiatric: Alert and oriented x3, affect grossly appropriate.   ECG: Tracing from 02/05/2015 showed sinus tachycardia with decreased R wave progression and nonspecific ST-T changes.   Recent Labwork: 02/05/2015: B Natriuretic Peptide 120.1* 02/06/2015: ALT 30; AST 39 02/08/2015: BUN 27*; Creatinine, Ser 1.18; Hemoglobin 12.6*; Platelets 254; Potassium 4.0; Sodium 137     Component Value Date/Time   CHOL 124 12/09/2011 0310   TRIG 222* 12/09/2011 0310   HDL 31* 12/09/2011 0310   CHOLHDL 4.0 12/09/2011 0310   VLDL 44* 12/09/2011 0310   LDLCALC 49 12/09/2011 0310    Other Studies Reviewed Today:  Echocardiogram 02/06/2015: Study Conclusions  - Left ventricle: The cavity size was normal. Wall thickness was increased in a pattern of mild LVH. Systolic function was normal. The estimated ejection fraction was in the range of 60% to 65%. Wall motion was normal; there were no regional wall motion abnormalities. Doppler parameters are consistent with abnormal left ventricular relaxation (grade 1 diastolic dysfunction). Doppler parameters are consistent with high ventricular filling pressure. - Mitral valve: Calcified annulus. - Left  atrium: The atrium was moderately dilated.  Assessment and Plan:  1. Multivessel CAD status post CABG with known graft disease. Recent episode of demand ischemia in the setting of COPD exacerbation noted. He is currently stable on medical therapy, no changes were made today. Recent ECG and echocardiogram reviewed.  2. Severe COPD, followed by Dr. Lake Bells.  3. Slowly healing left leg wound, followed by wound clinic in [place].  4. Hypercoagulable state with lupus anticoagulant positive and prior history of recurrent pulmonary emboli. He continues on Coumadin, followed by Dr. Edrick Oh.  Current medicines were reviewed with the patient today.  Disposition: FU with me in 4 months.   Signed, Satira Sark, MD, [facility] 03/14/2015 11:24 AM    [facility] [place], [place], [place] [address] Phone: [phone]; Fax: [phone]

## 2015-03-14 NOTE — Patient Instructions (Signed)
Your physician recommends that you continue on your current medications as directed. Please refer to the Current Medication list given to you today. Your physician recommends that you schedule a follow-up appointment in: 4 months. You will receive a reminder letter in the mail in about 2 months reminding you to call and schedule your appointment. If you don't receive this letter, please contact our office. 

## 2015-04-13 ENCOUNTER — Other Ambulatory Visit: Payer: Self-pay | Admitting: Cardiology

## 2015-04-25 ENCOUNTER — Ambulatory Visit: Payer: Medicare Other | Admitting: Pulmonary Disease

## 2015-05-21 ENCOUNTER — Other Ambulatory Visit: Payer: Self-pay | Admitting: Cardiology

## 2015-05-25 ENCOUNTER — Ambulatory Visit: Payer: Medicare Other | Admitting: Pulmonary Disease

## 2015-05-31 ENCOUNTER — Other Ambulatory Visit: Payer: Self-pay | Admitting: Cardiology

## 2015-07-18 ENCOUNTER — Ambulatory Visit: Payer: Medicare Other | Admitting: Pulmonary Disease

## 2015-08-27 ENCOUNTER — Encounter (HOSPITAL_COMMUNITY): Payer: Self-pay

## 2015-08-27 ENCOUNTER — Emergency Department (HOSPITAL_COMMUNITY): Payer: Medicare Other

## 2015-08-27 ENCOUNTER — Observation Stay (HOSPITAL_BASED_OUTPATIENT_CLINIC_OR_DEPARTMENT_OTHER)
Admission: EM | Admit: 2015-08-27 | Discharge: 2015-08-30 | Disposition: A | Discharge status: Home / Self Care | Payer: Medicare Other | Source: Home / Self Care | Attending: Emergency Medicine | Admitting: Emergency Medicine

## 2015-08-27 DIAGNOSIS — R0789 Other chest pain: Secondary | ICD-10-CM

## 2015-08-27 DIAGNOSIS — K8020 Calculus of gallbladder without cholecystitis without obstruction: Secondary | ICD-10-CM

## 2015-08-27 DIAGNOSIS — E119 Type 2 diabetes mellitus without complications: Secondary | ICD-10-CM | POA: Insufficient documentation

## 2015-08-27 DIAGNOSIS — G4733 Obstructive sleep apnea (adult) (pediatric): Secondary | ICD-10-CM | POA: Insufficient documentation

## 2015-08-27 DIAGNOSIS — R079 Chest pain, unspecified: Secondary | ICD-10-CM | POA: Diagnosis not present

## 2015-08-27 DIAGNOSIS — Z951 Presence of aortocoronary bypass graft: Secondary | ICD-10-CM

## 2015-08-27 DIAGNOSIS — J449 Chronic obstructive pulmonary disease, unspecified: Secondary | ICD-10-CM | POA: Insufficient documentation

## 2015-08-27 DIAGNOSIS — I639 Cerebral infarction, unspecified: Secondary | ICD-10-CM | POA: Diagnosis not present

## 2015-08-27 DIAGNOSIS — Z7984 Long term (current) use of oral hypoglycemic drugs: Secondary | ICD-10-CM | POA: Insufficient documentation

## 2015-08-27 DIAGNOSIS — I2510 Atherosclerotic heart disease of native coronary artery without angina pectoris: Secondary | ICD-10-CM

## 2015-08-27 DIAGNOSIS — Z87891 Personal history of nicotine dependence: Secondary | ICD-10-CM

## 2015-08-27 DIAGNOSIS — Z79899 Other long term (current) drug therapy: Secondary | ICD-10-CM

## 2015-08-27 DIAGNOSIS — E785 Hyperlipidemia, unspecified: Secondary | ICD-10-CM | POA: Insufficient documentation

## 2015-08-27 DIAGNOSIS — Z7951 Long term (current) use of inhaled steroids: Secondary | ICD-10-CM

## 2015-08-27 DIAGNOSIS — E1159 Type 2 diabetes mellitus with other circulatory complications: Secondary | ICD-10-CM | POA: Insufficient documentation

## 2015-08-27 DIAGNOSIS — N183 Chronic kidney disease, stage 3 unspecified: Secondary | ICD-10-CM | POA: Diagnosis present

## 2015-08-27 DIAGNOSIS — R600 Localized edema: Secondary | ICD-10-CM | POA: Insufficient documentation

## 2015-08-27 DIAGNOSIS — Z7901 Long term (current) use of anticoagulants: Secondary | ICD-10-CM

## 2015-08-27 DIAGNOSIS — K219 Gastro-esophageal reflux disease without esophagitis: Secondary | ICD-10-CM | POA: Insufficient documentation

## 2015-08-27 DIAGNOSIS — I10 Essential (primary) hypertension: Secondary | ICD-10-CM | POA: Insufficient documentation

## 2015-08-27 DIAGNOSIS — N182 Chronic kidney disease, stage 2 (mild): Secondary | ICD-10-CM | POA: Diagnosis not present

## 2015-08-27 DIAGNOSIS — I25118 Atherosclerotic heart disease of native coronary artery with other forms of angina pectoris: Secondary | ICD-10-CM | POA: Diagnosis present

## 2015-08-27 DIAGNOSIS — G459 Transient cerebral ischemic attack, unspecified: Secondary | ICD-10-CM | POA: Diagnosis not present

## 2015-08-27 DIAGNOSIS — Z86711 Personal history of pulmonary embolism: Secondary | ICD-10-CM | POA: Insufficient documentation

## 2015-08-27 LAB — CBC
HCT: 31.1 % — ABNORMAL LOW (ref 39.0–52.0)
HEMOGLOBIN: 9.4 g/dL — AB (ref 13.0–17.0)
MCH: 25.5 pg — ABNORMAL LOW (ref 26.0–34.0)
MCHC: 30.2 g/dL (ref 30.0–36.0)
MCV: 84.5 fL (ref 78.0–100.0)
Platelets: 485 10*3/uL — ABNORMAL HIGH (ref 150–400)
RBC: 3.68 MIL/uL — ABNORMAL LOW (ref 4.22–5.81)
RDW: 16.2 % — ABNORMAL HIGH (ref 11.5–15.5)
WBC: 11.4 10*3/uL — AB (ref 4.0–10.5)

## 2015-08-27 LAB — BASIC METABOLIC PANEL
ANION GAP: 10 (ref 5–15)
BUN: 14 mg/dL (ref 6–20)
CALCIUM: 8.9 mg/dL (ref 8.9–10.3)
CO2: 25 mmol/L (ref 22–32)
Chloride: 102 mmol/L (ref 101–111)
Creatinine, Ser: 1.06 mg/dL (ref 0.61–1.24)
GFR calc non Af Amer: >60 mL/min (ref >60)
Glucose, Bld: 149 mg/dL — ABNORMAL HIGH (ref 65–99)
Potassium: 4.1 mmol/L (ref 3.5–5.1)
Sodium: 137 mmol/L (ref 135–145)

## 2015-08-27 LAB — PROTIME-INR
INR: 3.34 — ABNORMAL HIGH (ref 0.00–1.49)
Prothrombin Time: 33.2 seconds — ABNORMAL HIGH (ref 11.6–15.2)

## 2015-08-27 LAB — I-STAT TROPONIN, ED: Troponin i, poc: 0.04 ng/mL (ref 0.00–0.08)

## 2015-08-27 LAB — APTT: APTT: 65 s — AB (ref 24–37)

## 2015-08-27 LAB — TROPONIN I: TROPONIN I: 0.13 ng/mL — AB (ref <0.031)

## 2015-08-27 MED ORDER — LOSARTAN POTASSIUM 50 MG PO TABS
100.0000 mg | ORAL_TABLET | Freq: Every day | ORAL | Status: DC
  Administered 2015-08-28 – 2015-08-30 (×3): 100 mg via ORAL
  Filled 2015-08-27 (×3): qty 2

## 2015-08-27 MED ORDER — ASPIRIN 81 MG PO CHEW
324.0000 mg | CHEWABLE_TABLET | Freq: Once | ORAL | Status: AC
  Administered 2015-08-27: 324 mg via ORAL
  Filled 2015-08-27: qty 4

## 2015-08-27 MED ORDER — INSULIN ASPART 100 UNIT/ML ~~LOC~~ SOLN
0.0000 [IU] | Freq: Three times a day (TID) | SUBCUTANEOUS | Status: DC
  Administered 2015-08-28 (×2): 3 [IU] via SUBCUTANEOUS
  Administered 2015-08-28 – 2015-08-29 (×3): 2 [IU] via SUBCUTANEOUS
  Administered 2015-08-29 – 2015-08-30 (×2): 3 [IU] via SUBCUTANEOUS
  Administered 2015-08-30: 2 [IU] via SUBCUTANEOUS
  Administered 2015-08-30: 3 [IU] via SUBCUTANEOUS

## 2015-08-27 MED ORDER — LACTULOSE 10 GM/15ML PO SOLN
33.7500 g | Freq: Every day | ORAL | Status: DC
  Administered 2015-08-28 – 2015-08-30 (×3): 33.75 g via ORAL
  Filled 2015-08-27 (×3): qty 60

## 2015-08-27 MED ORDER — ALBUTEROL SULFATE (2.5 MG/3ML) 0.083% IN NEBU: 3.0000 mL | INHALATION_SOLUTION | Freq: Four times a day (QID) | RESPIRATORY_TRACT | Status: DC | PRN

## 2015-08-27 MED ORDER — ONDANSETRON HCL 4 MG/2ML IJ SOLN
4.0000 mg | Freq: Four times a day (QID) | INTRAMUSCULAR | Status: DC | PRN
  Administered 2015-08-30: 4 mg via INTRAVENOUS
  Filled 2015-08-27: qty 2

## 2015-08-27 MED ORDER — GI COCKTAIL ~~LOC~~
30.0000 mL | Freq: Four times a day (QID) | ORAL | Status: DC | PRN
  Administered 2015-08-27: 30 mL via ORAL
  Filled 2015-08-27: qty 30

## 2015-08-27 MED ORDER — DOCUSATE SODIUM 100 MG PO CAPS
100.0000 mg | ORAL_CAPSULE | Freq: Two times a day (BID) | ORAL | Status: DC
  Administered 2015-08-27 – 2015-08-30 (×6): 100 mg via ORAL
  Filled 2015-08-27 (×6): qty 1

## 2015-08-27 MED ORDER — NITROGLYCERIN 2 % TD OINT
1.0000 [in_us] | TOPICAL_OINTMENT | Freq: Once | TRANSDERMAL | Status: AC
  Administered 2015-08-27: 1 [in_us] via TOPICAL
  Filled 2015-08-27: qty 1

## 2015-08-27 MED ORDER — INSULIN ASPART 100 UNIT/ML ~~LOC~~ SOLN: 0.0000 [IU] | Freq: Every day | SUBCUTANEOUS | Status: DC

## 2015-08-27 MED ORDER — MONTELUKAST SODIUM 10 MG PO TABS
10.0000 mg | ORAL_TABLET | Freq: Every day | ORAL | Status: DC
  Administered 2015-08-27 – 2015-08-29 (×3): 10 mg via ORAL
  Filled 2015-08-27 (×3): qty 1

## 2015-08-27 MED ORDER — FUROSEMIDE 20 MG PO TABS
20.0000 mg | ORAL_TABLET | Freq: Every day | ORAL | Status: DC
  Administered 2015-08-28 – 2015-08-30 (×3): 20 mg via ORAL
  Filled 2015-08-27 (×3): qty 1

## 2015-08-27 MED ORDER — MORPHINE SULFATE (PF) 4 MG/ML IV SOLN
4.0000 mg | Freq: Once | INTRAVENOUS | Status: AC
  Administered 2015-08-27: 4 mg via INTRAVENOUS
  Filled 2015-08-27: qty 1

## 2015-08-27 MED ORDER — LINAGLIPTIN 5 MG PO TABS
5.0000 mg | ORAL_TABLET | Freq: Every day | ORAL | Status: DC
  Administered 2015-08-28 – 2015-08-30 (×3): 5 mg via ORAL
  Filled 2015-08-27 (×3): qty 1

## 2015-08-27 MED ORDER — SALINE SPRAY 0.65 % NA SOLN
1.0000 | Freq: Four times a day (QID) | NASAL | Status: DC
  Administered 2015-08-28 – 2015-08-30 (×11): 1 via NASAL
  Filled 2015-08-27 (×3): qty 44

## 2015-08-27 MED ORDER — ATORVASTATIN CALCIUM 40 MG PO TABS
80.0000 mg | ORAL_TABLET | Freq: Every day | ORAL | Status: DC
  Administered 2015-08-28 – 2015-08-30 (×3): 80 mg via ORAL
  Filled 2015-08-27 (×3): qty 2

## 2015-08-27 MED ORDER — ASPIRIN 81 MG PO CHEW
81.0000 mg | CHEWABLE_TABLET | Freq: Every day | ORAL | Status: DC
  Administered 2015-08-28: 81 mg via ORAL
  Filled 2015-08-27: qty 1

## 2015-08-27 MED ORDER — HYDROCODONE-ACETAMINOPHEN 7.5-325 MG PO TABS
1.0000 | ORAL_TABLET | Freq: Four times a day (QID) | ORAL | Status: DC | PRN
  Administered 2015-08-27 – 2015-08-30 (×6): 1 via ORAL
  Filled 2015-08-27 (×7): qty 1

## 2015-08-27 MED ORDER — ZOLPIDEM TARTRATE 5 MG PO TABS
5.0000 mg | ORAL_TABLET | Freq: Every day | ORAL | Status: DC
  Administered 2015-08-27 – 2015-08-29 (×3): 5 mg via ORAL
  Filled 2015-08-27 (×3): qty 1

## 2015-08-27 MED ORDER — ACETAMINOPHEN 325 MG PO TABS
650.0000 mg | ORAL_TABLET | ORAL | Status: DC | PRN
  Administered 2015-08-28: 650 mg via ORAL
  Filled 2015-08-27: qty 2

## 2015-08-27 MED ORDER — POLYETHYLENE GLYCOL 3350 17 G PO PACK
17.0000 g | PACK | Freq: Every day | ORAL | Status: DC
  Administered 2015-08-28 – 2015-08-30 (×3): 17 g via ORAL
  Filled 2015-08-27 (×3): qty 1

## 2015-08-27 MED ORDER — ISOSORBIDE MONONITRATE ER 60 MG PO TB24
120.0000 mg | ORAL_TABLET | Freq: Every day | ORAL | Status: DC
  Administered 2015-08-28 – 2015-08-30 (×3): 120 mg via ORAL
  Filled 2015-08-27 (×3): qty 2

## 2015-08-27 MED ORDER — TIOTROPIUM BROMIDE MONOHYDRATE 18 MCG IN CAPS
18.0000 ug | ORAL_CAPSULE | Freq: Every day | RESPIRATORY_TRACT | Status: DC
  Administered 2015-08-28 – 2015-08-30 (×3): 18 ug via RESPIRATORY_TRACT
  Filled 2015-08-27: qty 5

## 2015-08-27 MED ORDER — NITROGLYCERIN 0.4 MG SL SUBL
0.4000 mg | SUBLINGUAL_TABLET | SUBLINGUAL | Status: DC | PRN
  Administered 2015-08-27: 0.4 mg via SUBLINGUAL
  Filled 2015-08-27: qty 1

## 2015-08-27 MED ORDER — BUDESONIDE-FORMOTEROL FUMARATE 160-4.5 MCG/ACT IN AERO
2.0000 | INHALATION_SPRAY | Freq: Two times a day (BID) | RESPIRATORY_TRACT | Status: DC
  Administered 2015-08-28 – 2015-08-30 (×5): 2 via RESPIRATORY_TRACT
  Filled 2015-08-27: qty 6

## 2015-08-27 MED ORDER — RIVAROXABAN 20 MG PO TABS
20.0000 mg | ORAL_TABLET | Freq: Every day | ORAL | Status: DC
  Administered 2015-08-28 – 2015-08-30 (×3): 20 mg via ORAL
  Filled 2015-08-27 (×3): qty 1

## 2015-08-27 MED ORDER — VERAPAMIL HCL ER 240 MG PO TBCR
240.0000 mg | EXTENDED_RELEASE_TABLET | Freq: Every day | ORAL | Status: DC
  Administered 2015-08-28 – 2015-08-30 (×3): 240 mg via ORAL
  Filled 2015-08-27 (×3): qty 1

## 2015-08-27 MED ORDER — SODIUM CHLORIDE 0.9 % IV SOLN
INTRAVENOUS | Status: DC
Start: 2015-08-27 — End: 2015-08-27
  Administered 2015-08-27: 17:00:00 via INTRAVENOUS

## 2015-08-27 MED ORDER — METOPROLOL TARTRATE 50 MG PO TABS
75.0000 mg | ORAL_TABLET | Freq: Two times a day (BID) | ORAL | Status: DC
  Administered 2015-08-28 – 2015-08-30 (×6): 75 mg via ORAL
  Filled 2015-08-27 (×6): qty 1

## 2015-08-27 NOTE — H&P (Signed)
[facility] History and Physical  JAECOB TYLKA [id number] DOB: 11-Sep-1940 DOA: 08/27/2015  Referring physician:  PCP: Sherrie Mustache, MD   Chief Complaint: Chest pain  HPI: CHADEN BATTIS is a 75 y.o. male with an established history of coronary artery disease, having multivessel disease status post coronary artery bypass grafting in 2005 (LIMA to LAD, SVG to OM, SVG to PDA), status post drug-eluting stent to circumflex in 2006, last cardiac catheterization performed 01/28/2011 that showed occluded saphenous vein grafts to obtuse marginal and RCA, severe native circumflex disease with cardiology recommending medical management at the time due to diffuse disease with very small caliber vessel. He presents to the emergency department with complaints of chest pain that started this morning. He describes his pain as tight and pressure-like located in the retrosternal region with radiation to his left shoulder and down his left arm. He denies nausea, vomiting, worsening shortness of breath from baseline, fevers, chills. During my encounter in the emergency department he is chest pain-free. He was given nitroglycerin by EMS with subsequent improvement to his symptoms. Labs showing troponin of 0.04, EKG did not reveal acute ischemic changes.                                                           Review of Systems:  Constitutional:  No weight loss, night sweats, Fevers, chills, fatigue.  HEENT:  No headaches, Difficulty swallowing,Tooth/dental problems,Sore throat,  No sneezing, itching, ear ache, nasal congestion, post nasal drip,  Cardio-vascular:  Positive for chest pain, denies Orthopnea, PND, swelling in lower extremities, anasarca, dizziness, palpitations  GI:  No heartburn, indigestion, abdominal pain, nausea, vomiting, diarrhea, change in bowel habits, loss of appetite  Resp:  Positive for shortness of breath with exertion or at rest. No excess mucus, no productive cough,  No non-productive cough, No coughing up of blood.No change in color of mucus.No wheezing.No chest wall deformity  Skin:  no rash or lesions.  GU:  no dysuria, change in color of urine, no urgency or frequency. No flank pain.  Musculoskeletal:  No joint pain or swelling. No decreased range of motion. No back pain.  Psych:  No change in mood or affect. No depression or anxiety. No memory loss.   Past Medical History  Diagnosis Date  . Coronary atherosclerosis of native coronary artery     a. NSTEMI 2005 s/p CABG (LIMA - LAD, SVG - OM, SVG - PDA). b. DES to Cx 02/2005. c. NSTEMI 07/2010 due to occ Cx - med rx. Known occluded vein grafts.  . Essential hypertension, benign   . Type 2 diabetes mellitus ([place])   . Dyslipidemia   . ACE inhibitor intolerance   . Asthma   . Recurrent pulmonary embolism ([place])     a. 01/2011. b. Bilateral PE 11/2011 - + lupus anticoagulant preliminary testing. Felt to require lifelong Coumadin.  . Pulmonary nodule     RML by CT 11/2011, stable compared to 2012.  Marland Kitchen Cholelithiasis   . Pulmonary HTN ([place])     Echo 11/2011 at time of PE. PA pressure not assessed on 03/2012 echo but RV was back to normal.  . Lupus anticoagulant positive   . GERD (gastroesophageal reflux disease)   . COPD (chronic obstructive pulmonary disease) ([place])   . OSA (obstructive sleep  apnea)    Past Surgical History  Procedure Laterality Date  . Coronary artery bypass graft  2005     LIMA to LAD, SVG to OM, SVG to RCA  . L4-l5 laminectomy    . Colonoscopy    . Esophagogastroduodenoscopy N/A 12/19/2013    Procedure: ESOPHAGOGASTRODUODENOSCOPY (EGD);  Surgeon: Lafayette Dragon, MD;  Location[facility];  Service: Endoscopy;  Laterality: N/A;  . Kidney stone removed     Social History:  reports that he quit smoking about 43 years ago. His smoking use included Cigarettes. He started smoking about 69 years ago. He has a 9 pack-year smoking history. His smokeless tobacco use includes Chew. He reports  that he does not drink alcohol or use illicit drugs.  Allergies  Allergen Reactions  . Ace Inhibitors Cough    Cough    Family History  Problem Relation Age of Onset  . Heart disease Father   . Heart disease Sister   . Breast cancer Sister   . Heart disease Brother      Prior to Admission medications   Medication Sig Start Date End Date Taking? Authorizing Provider  atorvastatin (LIPITOR) 80 MG tablet Take 1 tablet (80 mg total) by mouth daily. 02/09/15  Yes Geradine Girt, DO  budesonide-formoterol (SYMBICORT) 160-4.5 MCG/ACT inhaler Inhale 2 puffs into the lungs 2 (two) times daily.   Yes Historical Provider, MD  Coenzyme Q10 (CO Q 10) 100 MG CAPS Take 100 mg by mouth daily.   Yes Historical Provider, MD  docusate sodium (COLACE) 100 MG capsule Take 100 mg by mouth 2 (two) times daily.   Yes Historical Provider, MD  fish oil-omega-3 fatty acids 1000 MG capsule Take 2 g by mouth daily.    Yes Historical Provider, MD  fluticasone (FLONASE) 50 MCG/ACT nasal spray Place 2 sprays into the nose daily.     Yes Historical Provider, MD  furosemide (LASIX) 20 MG tablet Take 20 mg by mouth daily.   Yes Historical Provider, MD  HYDROcodone-acetaminophen (NORCO) 7.5-325 MG tablet Take 1 tablet by mouth 2 (two) times daily.   Yes Historical Provider, MD  HYDROcodone-acetaminophen (NORCO) 7.5-325 MG tablet Take 1 tablet by mouth every 6 (six) hours as needed for moderate pain.   Yes Historical Provider, MD  isosorbide mononitrate (IMDUR) 120 MG 24 hr tablet Take 120 mg by mouth daily.   Yes Historical Provider, MD  lactulose (CHRONULAC) 10 GM/15ML solution Take 33.75 g by mouth daily.   Yes Historical Provider, MD  losartan (COZAAR) 100 MG tablet Take 1 tablet (100 mg total) by mouth daily. 01/18/15  Yes Satira Sark, MD  metoprolol (LOPRESSOR) 50 MG tablet TAKE 1 AND 1/2 TABLETS BY MOUTH TWICE DAILY 05/22/15  Yes Satira Sark, MD  montelukast (SINGULAIR) 10 MG tablet Take 10 mg by mouth at  bedtime.   Yes Historical Provider, MD  nitroGLYCERIN (NITROSTAT) 0.4 MG SL tablet Place 1 tablet (0.4 mg total) under the tongue every 5 (five) minutes x 3 doses as needed. For chest pain 04/05/14  Yes Satira Sark, MD  omeprazole (PRILOSEC) 40 MG capsule Take 40 mg by mouth every evening.   Yes Historical Provider, MD  polyethylene glycol (MIRALAX / GLYCOLAX) packet Take 17 g by mouth daily.   Yes Historical Provider, MD  potassium chloride (K-DUR) 10 MEQ tablet Take 10 mEq by mouth daily.   Yes Historical Provider, MD  rivaroxaban (XARELTO) 20 MG TABS tablet Take 20 mg by mouth daily.  Yes Historical Provider, MD  sitaGLIPtin (JANUVIA) 25 MG tablet Take 1 tablet (25 mg total) by mouth daily. 02/09/15  Yes Geradine Girt, DO  sodium chloride (OCEAN) 0.65 % SOLN nasal spray Place 1 spray into both nostrils 4 (four) times daily.   Yes Historical Provider, MD  tiotropium (SPIRIVA) 18 MCG inhalation capsule Place 1 capsule (18 mcg total) into inhaler and inhale daily. 11/16/14  Yes Juanito Doom, MD  verapamil (CALAN-SR) 240 MG CR tablet TAKE 1 TABLET (240 MG TOTAL) BY MOUTH DAILY. 05/20/14  Yes Satira Sark, MD  zolpidem (AMBIEN) 10 MG tablet Take 10 mg by mouth at bedtime. 01/30/15  Yes Historical Provider, MD  acetaminophen (TYLENOL) 325 MG tablet Take 650 mg by mouth every 4 (four) hours as needed for mild pain. For pain    Historical Provider, MD  albuterol (PROAIR HFA) 108 (90 BASE) MCG/ACT inhaler Inhale 2 puffs into the lungs every 6 (six) hours as needed for wheezing or shortness of breath.  02/11/14 02/11/16  Historical Provider, MD  amoxicillin (AMOXIL) 500 MG capsule Take one pill daily the first week of the month 02/22/15   Juanito Doom, MD  chlorthalidone (HYGROTON) 25 MG tablet TAKE 1 TABLET BY MOUTH DAILY 12/09/14   Satira Sark, MD  HYDROcodone-acetaminophen (NORCO/VICODIN) 5-325 MG per tablet Take 1 tablet by mouth every 6 (six) hours as needed for moderate pain.     Historical Provider, MD  HYDROcodone-homatropine (HYCODAN) 5-1.5 MG/5ML syrup Take 5 mLs by mouth every 6 (six) hours as needed for cough. 02/09/15   Geradine Girt, DO  isosorbide mononitrate (IMDUR) 60 MG 24 hr tablet TAKE 3 TABLETS BY MOUTH ONCE DAILY 08/12/14   Satira Sark, MD  isosorbide mononitrate (IMDUR) 60 MG 24 hr tablet TAKE 3 TABLETS BY MOUTH ONCE DAILY 04/13/15   Satira Sark, MD  predniSONE (DELTASONE) 10 MG tablet 40mg  x 3 days, 30 mg x 3 days, 20 mg x 2 days, 10 mg x2 days then 5 mg daily 02/09/15   Geradine Girt, DO  verapamil (CALAN-SR) 240 MG CR tablet TAKE 1 TABLET (240 MG TOTAL) BY MOUTH DAILY. 05/31/15   Satira Sark, MD   Physical Exam: Filed Vitals:   08/27/15 1649 08/27/15 1700 08/27/15 1730 08/27/15 1803  BP: 137/88 150/67 140/77 144/77  Pulse: 105 106 100 100  Temp:    99.5 F (37.5 C)  TempSrc:    Oral  Resp:  15 16 20   SpO2:  96% 99% 100%    Wt Readings from Last 3 Encounters:  03/14/15 87.998 kg (194 lb)  02/22/15 87.998 kg (194 lb)  02/09/15 83.734 kg (184 lb 9.6 oz)    General:  Appears calm and comfortable, he is fairly chest pain-free in no acute distress Eyes: PERRL, normal lids, irises & conjunctiva ENT: grossly normal hearing, lips & tongue Neck: no LAD, masses or thyromegaly Cardiovascular: RRR, no m/r/g. No LE edema. Telemetry: SR, no arrhythmias  Respiratory: CTA bilaterally, no w/r/r. Normal respiratory effort. Abdomen: soft, ntnd Skin: no rash or induration seen on limited exam, surgical incision site from sternotomy well-healed Musculoskeletal: He has an external fixator to his right lower extremity. His left lower extremity is wrapped.  Psychiatric: grossly normal mood and affect, speech fluent and appropriate Neurologic: grossly non-focal.          Labs on Admission:  Basic Metabolic Panel:  Recent Labs Lab 08/27/15 1609  NA 137  K 4.1  CL 102  CO2 25  GLUCOSE 149*  BUN 14  CREATININE 1.06  CALCIUM 8.9    Liver Function Tests: No results for input(s): AST, ALT, ALKPHOS, BILITOT, PROT, ALBUMIN in the last 168 hours. No results for input(s): LIPASE, AMYLASE in the last 168 hours. No results for input(s): AMMONIA in the last 168 hours. CBC:  Recent Labs Lab 08/27/15 1609  WBC 11.4*  HGB 9.4*  HCT 31.1*  MCV 84.5  PLT 485*   Cardiac Enzymes: No results for input(s): CKTOTAL, CKMB, CKMBINDEX, TROPONINI in the last 168 hours.  BNP (last 3 results)  Recent Labs  02/05/15 1735  BNP 120.1*    ProBNP (last 3 results) No results for input(s): PROBNP in the last 8760 hours.  CBG: No results for input(s): GLUCAP in the last 168 hours.  Radiological Exams on Admission: Dg Chest Portable 1 View  08/27/2015  CLINICAL DATA:  Chest pain EXAM: PORTABLE CHEST 1 VIEW COMPARISON:  04/03/2015 FINDINGS: Cardiac shadow is within normal limits. Postsurgical changes are noted. Aortic calcifications are again seen. Lungs are well-aerated without focal infiltrate or sizable effusion. No acute bony abnormality is seen. IMPRESSION: No active disease. Electronically Signed   By: Inez Catalina M.D.   On: 08/27/2015 17:03    EKG: Independently reviewed.   Assessment/Plan Principal Problem:   Chest pain Active Problems:   Dyslipidemia   Type 2 diabetes mellitus (H[facility])   CKD (chronic kidney disease), stage II   Coronary atherosclerosis of native coronary artery   Atherosclerosis of native coronary artery of native heart with other form of angina pectoris ([place])   1. Chest pain. Mr. Sodergren has a history of multi vessel disease status post coronary artery bypass grafting 3 in 2005, with placement of drug-eluting stent to circumflex in 2006, last card catheterization performed on 01/28/2011 that showed occluded venous grafts as well as severe circumflex disease. Cardiology recommended medical management at that time due to diffuse disease with very small caliber vessels. On this presentation initial  troponin negative, EKG not revealing acute ischemic changes, which chest pain improving after the administration of nitroglycerin. Plan to place Mr. Sorn in overnight observation with continuous cardiac monitoring. Will cycle troponins overnight. I placed a request for cardiology evaluation in a.m. Will continue Lipitor 80 mg by mouth daily, metoprolol 75 mg by mouth twice a day, Cozaar 100 mg daily, isosorbide mononitrate 120 mg daily, as needed nitroglycerin. I added aspirin 81 mg by mouth daily until evaluated by cardiology. 2. History of recurrent pulmonary embolism. Patient having a history of PEs as well as positive lupus anticoagulant for which she is on chronic anticoagulation with Xarelto. This will be continued during this hospitalization. 3. History of Achilles tendon rupture. He is receiving care at [facility], currently has an external fixator in place. He has a follow-up visit with his orthopedic surgeon this Thursday. 4. Type 2 diabetes mellitus. Blood sugars stable on presentation, will provide sliding scale insulin with Accu-Cheks. Continue Januvia 25 mg by mouth daily (or equivalent on formulary).  5. Hypertension. He is on multiple antihypertensive agents at home which will be continued during this hospitalization. On [place] 120 mg by mouth daily, metoprolol succinate 5 mg by mouth twice a day, Cozaar 100 mg by mouth daily, verapamil 240 mg by mouth daily. Monitor blood pressures. 6. Dyslipidemia. Continue statin therapy 7. Chronic obstructive pulmonary disease. Currently no issues. He has previous hospitalizations for COPD exacerbation 8. DVT prophylaxis. Patient fully anticoagulated with Xarelto  Code Status: Full code Family Communication: I spoke to his wife was present at bedside Disposition Plan: Placed in overnight observation  Time spent: 38 minutes  Kelvin Cellar [facility] Pager [phone]

## 2015-08-27 NOTE — ED Provider Notes (Signed)
CSN: [id number]     Arrival date & time 08/27/15  1548 History   First MD Initiated Contact with Patient 08/27/15 1617     Chief Complaint  Patient presents with  . Chest Pain   HPI Patient presents to emergency room with complaints of chest pressure that he first noticed when he woke up this morning at around 7 AM. It is located in the center of his chest. He's felt some shortness of breath with this. He denies any nausea or diaphoresis. Episodes have been coming and going throughout the day. Later in the day he developed pain also in his left shoulder. He also felt some tingling in his left hand. Patient is currently recovering from an Achilles tendon repair on his right lower leg.  He has been less mobile because of that. EMS was called and he was given 3 nitroglycerin with some improvement of his symptoms although he has very mild discomfort still. Past Medical History  Diagnosis Date  . Coronary atherosclerosis of native coronary artery     a. NSTEMI 2005 s/p CABG (LIMA - LAD, SVG - OM, SVG - PDA). b. DES to Cx 02/2005. c. NSTEMI 07/2010 due to occ Cx - med rx. Known occluded vein grafts.  . Essential hypertension, benign   . Type 2 diabetes mellitus ([place])   . Dyslipidemia   . ACE inhibitor intolerance   . Asthma   . Recurrent pulmonary embolism ([place])     a. 01/2011. b. Bilateral PE 11/2011 - + lupus anticoagulant preliminary testing. Felt to require lifelong Coumadin.  . Pulmonary nodule     RML by CT 11/2011, stable compared to 2012.  Marland Kitchen Cholelithiasis   . Pulmonary HTN ([place])     Echo 11/2011 at time of PE. PA pressure not assessed on 03/2012 echo but RV was back to normal.  . Lupus anticoagulant positive   . GERD (gastroesophageal reflux disease)   . COPD (chronic obstructive pulmonary disease) ([place])   . OSA (obstructive sleep apnea)    Past Surgical History  Procedure Laterality Date  . Coronary artery bypass graft  2005     LIMA to LAD, SVG to OM, SVG to RCA  . L4-l5 laminectomy     . Colonoscopy    . Esophagogastroduodenoscopy N/A 12/19/2013    Procedure: ESOPHAGOGASTRODUODENOSCOPY (EGD);  Surgeon: Lafayette Dragon, MD;  Location: [facility] ENDOSCOPY;  Service: Endoscopy;  Laterality: N/A;  . Kidney stone removed     Family History  Problem Relation Age of Onset  . Heart disease Father   . Heart disease Sister   . Breast cancer Sister   . Heart disease Brother    Social History  Substance Use Topics  . Smoking status: Former Smoker -- 0.30 packs/day for 30 years    Types: Cigarettes    Start date: 09/28/1945    Quit date: 07/15/1972  . Smokeless tobacco: Current User    Types: Chew     Comment: chews 1/2 pack tobacco per day  . Alcohol Use: No    Review of Systems  All other systems reviewed and are negative.     Allergies  Ace inhibitors  Home Medications   Prior to Admission medications   Medication Sig Start Date End Date Taking? Authorizing Provider  atorvastatin (LIPITOR) 80 MG tablet Take 1 tablet (80 mg total) by mouth daily. 02/09/15  Yes Geradine Girt, DO  budesonide-formoterol (SYMBICORT) 160-4.5 MCG/ACT inhaler Inhale 2 puffs into the lungs 2 (two) times daily.  Yes Historical Provider, MD  Coenzyme Q10 (CO Q 10) 100 MG CAPS Take 100 mg by mouth daily.   Yes Historical Provider, MD  docusate sodium (COLACE) 100 MG capsule Take 100 mg by mouth 2 (two) times daily.   Yes Historical Provider, MD  fish oil-omega-3 fatty acids 1000 MG capsule Take 2 g by mouth daily.    Yes Historical Provider, MD  fluticasone (FLONASE) 50 MCG/ACT nasal spray Place 2 sprays into the nose daily.     Yes Historical Provider, MD  furosemide (LASIX) 20 MG tablet Take 20 mg by mouth daily.   Yes Historical Provider, MD  HYDROcodone-acetaminophen (NORCO) 7.5-325 MG tablet Take 1 tablet by mouth 2 (two) times daily.   Yes Historical Provider, MD  HYDROcodone-acetaminophen (NORCO) 7.5-325 MG tablet Take 1 tablet by mouth every 6 (six) hours as needed for moderate pain.   Yes  Historical Provider, MD  isosorbide mononitrate (IMDUR) 120 MG 24 hr tablet Take 120 mg by mouth daily.   Yes Historical Provider, MD  lactulose (CHRONULAC) 10 GM/15ML solution Take 33.75 g by mouth daily.   Yes Historical Provider, MD  losartan (COZAAR) 100 MG tablet Take 1 tablet (100 mg total) by mouth daily. 01/18/15  Yes Satira Sark, MD  metoprolol (LOPRESSOR) 50 MG tablet TAKE 1 AND 1/2 TABLETS BY MOUTH TWICE DAILY 05/22/15  Yes Satira Sark, MD  montelukast (SINGULAIR) 10 MG tablet Take 10 mg by mouth at bedtime.   Yes Historical Provider, MD  nitroGLYCERIN (NITROSTAT) 0.4 MG SL tablet Place 1 tablet (0.4 mg total) under the tongue every 5 (five) minutes x 3 doses as needed. For chest pain 04/05/14  Yes Satira Sark, MD  omeprazole (PRILOSEC) 40 MG capsule Take 40 mg by mouth every evening.   Yes Historical Provider, MD  polyethylene glycol (MIRALAX / GLYCOLAX) packet Take 17 g by mouth daily.   Yes Historical Provider, MD  potassium chloride (K-DUR) 10 MEQ tablet Take 10 mEq by mouth daily.   Yes Historical Provider, MD  rivaroxaban (XARELTO) 20 MG TABS tablet Take 20 mg by mouth daily.   Yes Historical Provider, MD  sitaGLIPtin (JANUVIA) 25 MG tablet Take 1 tablet (25 mg total) by mouth daily. 02/09/15  Yes Geradine Girt, DO  sodium chloride (OCEAN) 0.65 % SOLN nasal spray Place 1 spray into both nostrils 4 (four) times daily.   Yes Historical Provider, MD  tiotropium (SPIRIVA) 18 MCG inhalation capsule Place 1 capsule (18 mcg total) into inhaler and inhale daily. 11/16/14  Yes Juanito Doom, MD  verapamil (CALAN-SR) 240 MG CR tablet TAKE 1 TABLET (240 MG TOTAL) BY MOUTH DAILY. 05/20/14  Yes Satira Sark, MD  zolpidem (AMBIEN) 10 MG tablet Take 10 mg by mouth at bedtime. 01/30/15  Yes Historical Provider, MD  acetaminophen (TYLENOL) 325 MG tablet Take 650 mg by mouth every 4 (four) hours as needed for mild pain. For pain    Historical Provider, MD  albuterol (PROAIR HFA)  108 (90 BASE) MCG/ACT inhaler Inhale 2 puffs into the lungs every 6 (six) hours as needed for wheezing or shortness of breath.  02/11/14 02/11/16  Historical Provider, MD  amoxicillin (AMOXIL) 500 MG capsule Take one pill daily the first week of the month 02/22/15   Juanito Doom, MD  chlorthalidone (HYGROTON) 25 MG tablet TAKE 1 TABLET BY MOUTH DAILY 12/09/14   Satira Sark, MD  HYDROcodone-acetaminophen (NORCO/VICODIN) 5-325 MG per tablet Take 1 tablet by mouth  every 6 (six) hours as needed for moderate pain.    Historical Provider, MD  HYDROcodone-homatropine (HYCODAN) 5-1.5 MG/5ML syrup Take 5 mLs by mouth every 6 (six) hours as needed for cough. 02/09/15   Geradine Girt, DO  isosorbide mononitrate (IMDUR) 60 MG 24 hr tablet TAKE 3 TABLETS BY MOUTH ONCE DAILY 08/12/14   Satira Sark, MD  isosorbide mononitrate (IMDUR) 60 MG 24 hr tablet TAKE 3 TABLETS BY MOUTH ONCE DAILY 04/13/15   Satira Sark, MD  predniSONE (DELTASONE) 10 MG tablet 40mg  x 3 days, 30 mg x 3 days, 20 mg x 2 days, 10 mg x2 days then 5 mg daily 02/09/15   Geradine Girt, DO  verapamil (CALAN-SR) 240 MG CR tablet TAKE 1 TABLET (240 MG TOTAL) BY MOUTH DAILY. 05/31/15   Satira Sark, MD   BP 137/88 mmHg  Pulse 105  Temp(Src) 100.1 F (37.8 C) (Oral)  Resp 15  SpO2 97% Physical Exam  Constitutional: He appears well-developed and well-nourished. No distress.  HENT:  Head: Normocephalic and atraumatic.  Right Ear: External ear normal.  Left Ear: External ear normal.  Eyes: Conjunctivae are normal. Right eye exhibits no discharge. Left eye exhibits no discharge. No scleral icterus.  Neck: Neck supple. No tracheal deviation present.  Cardiovascular: Normal rate, regular rhythm and intact distal pulses.   Pulmonary/Chest: Effort normal and breath sounds normal. No stridor. No respiratory distress. He has no wheezes. He has no rales.  Old midline sternotomy scar  Abdominal: Soft. Bowel sounds are normal. He  exhibits no distension. There is no tenderness. There is no rebound and no guarding.  Musculoskeletal: He exhibits no edema or tenderness.  Right lower leg has external fixators and pins, no erythema, mild edema  Neurological: He is alert. He has normal strength. No cranial nerve deficit (no facial droop, extraocular movements intact, no slurred speech) or sensory deficit. He exhibits normal muscle tone. He displays no seizure activity. Coordination normal.  Skin: Skin is warm and dry. No rash noted.  Psychiatric: He has a normal mood and affect.  Nursing note and vitals reviewed.   ED Course  Procedures (including critical care time) Labs Review Labs Reviewed  CBC - Abnormal; Notable for the following:    WBC 11.4 (*)    RBC 3.68 (*)    Hemoglobin 9.4 (*)    HCT 31.1 (*)    MCH 25.5 (*)    RDW 16.2 (*)    Platelets 485 (*)    All other components within normal limits  BASIC METABOLIC PANEL - Abnormal; Notable for the following:    Glucose, Bld 149 (*)    All other components within normal limits  APTT - Abnormal; Notable for the following:    aPTT 65 (*)    All other components within normal limits  PROTIME-INR - Abnormal; Notable for the following:    Prothrombin Time 33.2 (*)    INR 3.34 (*)    All other components within normal limits  I-STAT TROPOININ, ED    Imaging Review Dg Chest Portable 1 View  08/27/2015  CLINICAL DATA:  Chest pain EXAM: PORTABLE CHEST 1 VIEW COMPARISON:  04/03/2015 FINDINGS: Cardiac shadow is within normal limits. Postsurgical changes are noted. Aortic calcifications are again seen. Lungs are well-aerated without focal infiltrate or sizable effusion. No acute bony abnormality is seen. IMPRESSION: No active disease. Electronically Signed   By: Inez Catalina M.D.   On: 08/27/2015 17:03   I have  personally reviewed and evaluated these images and lab results as part of my medical decision-making.   EKG Interpretation   Date/Time:  Sunday August 27 2015 15:52:45 EST Ventricular Rate:  102 PR Interval:  143 QRS Duration: 93 QT Interval:  340 QTC Calculation: 443 R Axis:   64 Text Interpretation:  Sinus tachycardia Abnormal inferior Q waves Repol  abnrm, severe global ischemia (LM/MVD) No significant change since last  tracing Confirmed by KOHUT  MD, [place] ([id number]) on 08/27/2015 3:55:28 PM      MDM   Final diagnoses:  Chest pain, unspecified chest pain type     Pt presents with symptoms concerning for angina.  He has a history of CAD with prior CABG and stents.  Sx today are similar to prior anginal episodes.  Initial ECG without acute changes.  Initial troponin normal.    Pt has prior PE but is on anticoagulants, INR is 3.34.  Doubt recurrent PE at this time.  He is not having persistent chest pain or shortness of breath.  Will consult with medical service for admission, serial enzymes, cardiac consultation.    Dorie Rank, MD 08/27/15 [phone]

## 2015-08-27 NOTE — ED Notes (Signed)
Pt states CP began after waking this morning, described as pressure, to center of chest. Pt also states pain to left shoulder earlier today. Pt became pain free after receiving nitro x 3 by EMS. Pt states pain has returned since arrival and is now 7/10 with tingling to left hand. Pt is also on Xeralto due to hx of PEs. Pt also c/o pain to right leg. Orthopedic device in place from surgery in Dec.

## 2015-08-27 NOTE — ED Notes (Signed)
Pt states he has had chest pressure most of the day. Pt was given NTG X 3 prior to arrival by EMS. Pt is also complaining of pain in right lower leg, mostly the heel from repair of a ruptures tendon

## 2015-08-28 DIAGNOSIS — R7989 Other specified abnormal findings of blood chemistry: Secondary | ICD-10-CM | POA: Diagnosis not present

## 2015-08-28 DIAGNOSIS — I200 Unstable angina: Secondary | ICD-10-CM

## 2015-08-28 DIAGNOSIS — M67971 Unspecified disorder of synovium and tendon, right ankle and foot: Secondary | ICD-10-CM

## 2015-08-28 DIAGNOSIS — K219 Gastro-esophageal reflux disease without esophagitis: Secondary | ICD-10-CM

## 2015-08-28 DIAGNOSIS — I25118 Atherosclerotic heart disease of native coronary artery with other forms of angina pectoris: Secondary | ICD-10-CM | POA: Diagnosis not present

## 2015-08-28 DIAGNOSIS — R079 Chest pain, unspecified: Secondary | ICD-10-CM | POA: Diagnosis not present

## 2015-08-28 DIAGNOSIS — I2510 Atherosclerotic heart disease of native coronary artery without angina pectoris: Secondary | ICD-10-CM

## 2015-08-28 DIAGNOSIS — E785 Hyperlipidemia, unspecified: Secondary | ICD-10-CM

## 2015-08-28 DIAGNOSIS — N182 Chronic kidney disease, stage 2 (mild): Secondary | ICD-10-CM | POA: Diagnosis not present

## 2015-08-28 DIAGNOSIS — F418 Other specified anxiety disorders: Secondary | ICD-10-CM

## 2015-08-28 DIAGNOSIS — E1159 Type 2 diabetes mellitus with other circulatory complications: Secondary | ICD-10-CM | POA: Diagnosis not present

## 2015-08-28 LAB — CBC
HCT: 27.6 % — ABNORMAL LOW (ref 39.0–52.0)
Hemoglobin: 8.6 g/dL — ABNORMAL LOW (ref 13.0–17.0)
MCH: 26.3 pg (ref 26.0–34.0)
MCHC: 31.2 g/dL (ref 30.0–36.0)
MCV: 84.4 fL (ref 78.0–100.0)
PLATELETS: 501 10*3/uL — AB (ref 150–400)
RBC: 3.27 MIL/uL — ABNORMAL LOW (ref 4.22–5.81)
RDW: 16.1 % — AB (ref 11.5–15.5)
WBC: 9.8 10*3/uL (ref 4.0–10.5)

## 2015-08-28 LAB — GLUCOSE, CAPILLARY
GLUCOSE-CAPILLARY: 129 mg/dL — AB (ref 65–99)
GLUCOSE-CAPILLARY: 167 mg/dL — AB (ref 65–99)
Glucose-Capillary: 163 mg/dL — ABNORMAL HIGH (ref 65–99)
Glucose-Capillary: 133 mg/dL — ABNORMAL HIGH (ref 65–99)
Glucose-Capillary: 131 mg/dL — ABNORMAL HIGH (ref 65–99)

## 2015-08-28 LAB — TROPONIN I
TROPONIN I: 0.14 ng/mL — AB (ref <0.031)
Troponin I: 0.18 ng/mL — ABNORMAL HIGH (ref <0.031)

## 2015-08-28 LAB — BASIC METABOLIC PANEL
Anion gap: 9 (ref 5–15)
BUN: 12 mg/dL (ref 6–20)
CHLORIDE: 105 mmol/L (ref 101–111)
CO2: 23 mmol/L (ref 22–32)
CREATININE: 1.01 mg/dL (ref 0.61–1.24)
Calcium: 8.7 mg/dL — ABNORMAL LOW (ref 8.9–10.3)
GFR calc Af Amer: >60 mL/min (ref >60)
GFR calc non Af Amer: >60 mL/min (ref >60)
Glucose, Bld: 130 mg/dL — ABNORMAL HIGH (ref 65–99)
Potassium: 4.5 mmol/L (ref 3.5–5.1)
SODIUM: 137 mmol/L (ref 135–145)

## 2015-08-28 MED ORDER — SERTRALINE HCL 50 MG PO TABS
25.0000 mg | ORAL_TABLET | Freq: Every day | ORAL | Status: DC
  Administered 2015-08-28 – 2015-08-30 (×3): 25 mg via ORAL
  Filled 2015-08-28 (×3): qty 1

## 2015-08-28 MED ORDER — ENSURE ENLIVE PO LIQD
237.0000 mL | Freq: Three times a day (TID) | ORAL | Status: DC
Start: 2015-08-28 — End: 2015-08-30
  Administered 2015-08-28 – 2015-08-30 (×7): 237 mL via ORAL

## 2015-08-28 MED ORDER — PANTOPRAZOLE SODIUM 40 MG PO TBEC
40.0000 mg | DELAYED_RELEASE_TABLET | Freq: Every day | ORAL | Status: DC
  Administered 2015-08-28 – 2015-08-30 (×3): 40 mg via ORAL
  Filled 2015-08-28 (×3): qty 1

## 2015-08-28 MED ORDER — RANOLAZINE ER 500 MG PO TB12
500.0000 mg | ORAL_TABLET | Freq: Two times a day (BID) | ORAL | Status: DC
  Administered 2015-08-28 – 2015-08-29 (×3): 500 mg via ORAL
  Filled 2015-08-28 (×2): qty 1

## 2015-08-28 NOTE — Progress Notes (Signed)
Initial Nutrition Assessment  INTERVENTION:  -Provide Ensure Enlive TID, 350 kcal, 20 grams of protein per bottle. -Encourage PO intake  NUTRITION DIAGNOSIS:   Inadequate oral intake related to poor appetite as evidenced by per patient/family report, percent weight loss.  GOAL:   Patient will meet greater than or equal to 90% of their needs  MONITOR:   PO intake, Supplement acceptance, Weight trends, Labs  REASON FOR ASSESSMENT:   Malnutrition Screening Tool  ASSESSMENT:   Pt presents to the emergency department with complaints of chest pain that started 08/27/2015.Pt denies nausea, vomiting, worsening shortness of breath from baseline, fevers, chills.   Pt seen for MST. Pt states his appetite has been poor the past 3 days. He is only able to consume a "spoonful". PTA pt was consuming 3 meals daily. He does not consume any oral nutrition supplement at his facility. Pt has agreed to try an Ensure Enlive while here to help meet his nutritional needs.   Pt reports an 18 lb weight loss, but did not state time frame. Per chart review pt has lost 24 lb (13%) within 6 months, which is significant for time frame. Suspect that with significant weight loss pt has not been meeting his nutritional needs for longer then the 3 days he reported.   Conducted nutrition focused physical exam, identified no muscle or fat wasting.  Medications reviewed. Labs reviewed.  Diet Order:  Diet Carb Modified Fluid consistency:: Thin; Room service appropriate?: Yes  Skin:  Reviewed, no issues  Last BM:  08/27/2015  Height:   Ht Readings from Last 1 Encounters:  08/27/15 5\' 8"  (1.727 m)    Weight:   Wt Readings from Last 1 Encounters:  08/27/15 170 lb 1.6 oz (77.157 kg)    Ideal Body Weight:  70 kg  BMI:  Body mass index is 25.87 kg/(m^2).  Estimated Nutritional Needs:   Kcal:  1900-2100 (25 kcal/kg)  Protein:  95-105 (1.2 g /kg)  Fluid:  >/= 2 L  EDUCATION NEEDS:   No education  needs identified at this time  [place], Dietetic Intern Pager: [phone]

## 2015-08-28 NOTE — Care Management Obs Status (Signed)
[place] NOTIFICATION   Patient Details  Name: Henry [name] MRN: [id number] Date of Birth: May 21, 1941   Medicare Observation Status Notification Given:  Yes    Alvie Heidelberg, RN 08/28/2015, 4:37 PM

## 2015-08-28 NOTE — Discharge Instructions (Signed)

## 2015-08-28 NOTE — Care Management Note (Signed)
Case Management Note  Patient Details  Name: Henry [name] MRN: [id number] Date of Birth: [birth year]  Subjective/Objective:        Spoke with patient who is alert and oriented patient is from [place] where he was attending rehab for separted tendon in right lower extremity. Patient normally lives at home with his spouse. Will return to [facility] at time of discharge to resume Rehab.            Action/Plan:  SNF Expected Discharge Date:                  Expected Discharge Plan:  [place]  In-House Referral:     Discharge planning Services  CM Consult  Post Acute Care Choice:    Choice offered to:     DME Arranged:    DME Agency:     HH Arranged:    [place] Agency:     Status of Service:  Completed, signed off  Medicare Important Message Given:    Date Medicare IM Given:    Medicare IM give by:    Date Additional Medicare IM Given:    Additional Medicare Important Message give by:     If discussed at [place] of Stay Meetings, dates discussed:    Additional Comments:  Alvie Heidelberg, RN 08/28/2015, 4:38 PM

## 2015-08-28 NOTE — Consult Note (Signed)
CARDIOLOGY CONSULT NOTE   Patient ID: Henry [name] MRN: [id number] DOB/AGE: 1941/06/29 75 y.o.  Admit Date: 08/27/2015 Referring Physician:  [facility] - Kelvin Cellar, MD Primary Physician: Sherrie Mustache, MD Consulting Cardiologist: Rozann Lesches MD Primary Cardiologist: Rozann Lesches MD [facility])  Reason for Consultation: Chest Pain   Clinical Summary Henry [name] is a 75 y.o.male with known history of CAD, NSTEMI in 07/2010 with DES to the Cx , and 3 vessel CABG in 2005 with known graft disease, hypertension, diabetes and recurrent pulmonary embolism on coumadin therapy. Other hx includes COPD and Lupus. He was admitted via the ER with complaints of chest pain.  He has been a resident of [facility] facility for physical rehabilitation after undergoing a external fixation and repair of his right ankle in the setting of an avulsion fracture, thought to be secondary to infection. He has been undergoing this for several weeks and progressing slowly. On day of admission, he was lying in bed, got up to use the bathroom and began to feel some chest pressure and tightness viselike with associated shortness of breath. The pain became worse with radiation to the left shoulder and down the left arm. He denied any diaphoresis, nausea or dizziness. He called the nurse suggested he come to the hospital for evaluation. The patient states it is similar to the pain he had prior to having had his MI, that also is similar to his COPD exacerbation, and he cannot tell which. He is a difficult historian. His wife at bedside, has filled in his history. The pain lasted apparently 3 hours being relieved in the emergency room. He did receive sublingual nitroglycerin prior to being transferred to the ER.  On arrival to the ER, BP 149/74, HR 101, O2 Sat 98%. Temp 100.1 He was found to be anemic with Hgb of 9.4, Hct 31.1, Plts 485, Pt 33.2, INR 3.34. CXR was normal. EKG NSR with ST depression inferior,  antero/lateral. Initial troponin 0.14, 0.18 respectively.  He was treated with ASA, NTG sublingual and morphine. Since arrival, he is experiencing one episode of chest pain during yesterday evening, he was treated with a GI cocktail with some relief, but continued to have intermittent chest tightness that was minimal.  He also states he is becoming more and more depressed and emotional. His wife believes that he is having some anxiety. During my discussion with him he was very tearful. He began to have some chest tightness while he became emotional.   Allergies  Allergen Reactions  . Ace Inhibitors Cough    Cough    Medications Scheduled Medications: . aspirin  81 mg Oral Daily  . atorvastatin  80 mg Oral Daily  . budesonide-formoterol  2 puff Inhalation BID  . docusate sodium  100 mg Oral BID  . furosemide  20 mg Oral Daily  . insulin aspart  0-15 Units Subcutaneous TID WC  . insulin aspart  0-5 Units Subcutaneous QHS  . isosorbide mononitrate  120 mg Oral Daily  . lactulose  33.75 g Oral Daily  . linagliptin  5 mg Oral Daily  . losartan  100 mg Oral Daily  . metoprolol  75 mg Oral BID  . montelukast  10 mg Oral QHS  . polyethylene glycol  17 g Oral Daily  . rivaroxaban  20 mg Oral Daily  . sodium chloride  1 spray Each Nare QID  . tiotropium  18 mcg Inhalation Daily  . verapamil  240 mg Oral Daily  .  zolpidem  5 mg Oral QHS    PRN Medications: acetaminophen, albuterol, gi cocktail, HYDROcodone-acetaminophen, ondansetron (ZOFRAN) IV   Past Medical History  Diagnosis Date  . Coronary atherosclerosis of native coronary artery     a. NSTEMI 2005 s/p CABG (LIMA - LAD, SVG - OM, SVG - PDA). b. DES to Cx 02/2005. c. NSTEMI 07/2010 due to occ Cx - med rx. Known occluded vein grafts.  . Essential hypertension, benign   . Type 2 diabetes mellitus ([place])   . Dyslipidemia   . ACE inhibitor intolerance   . Asthma   . Recurrent pulmonary embolism ([place])     a. 01/2011. b. Bilateral PE  11/2011 - + lupus anticoagulant preliminary testing. Felt to require lifelong Coumadin.  . Pulmonary nodule     RML by CT 11/2011, stable compared to 2012.  Marland Kitchen Cholelithiasis   . Pulmonary HTN ([place])     Echo 11/2011 at time of PE. PA pressure not assessed on 03/2012 echo but RV was back to normal.  . Lupus anticoagulant positive   . GERD (gastroesophageal reflux disease)   . COPD (chronic obstructive pulmonary disease) ([place])   . OSA (obstructive sleep apnea)     Past Surgical History  Procedure Laterality Date  . Coronary artery bypass graft  2005     LIMA to LAD, SVG to OM, SVG to RCA  . L4-l5 laminectomy    . Colonoscopy    . Esophagogastroduodenoscopy N/A 12/19/2013    Procedure: ESOPHAGOGASTRODUODENOSCOPY (EGD);  Surgeon: Lafayette Dragon, MD;  Location: [facility] ENDOSCOPY;  Service: Endoscopy;  Laterality: N/A;  . Kidney stone removed      Family History  Problem Relation Age of Onset  . Heart disease Father   . Heart disease Sister   . Breast cancer Sister   . Heart disease Brother     Social History Henry [name] reports that he quit smoking about 43 years ago. His smoking use included Cigarettes. He started smoking about 69 years ago. He has a 9 pack-year smoking history. His smokeless tobacco use includes Chew. Henry [name] reports that he does not drink alcohol.  Review of Systems Complete review of systems are found to be negative unless outlined in H&P above. Constipation, feelings of depression.  Physical Examination Blood pressure 161/88, pulse 101, temperature 99.2 F (37.3 C), temperature source Oral, resp. rate 18, height 5\' 8"  (1.727 m), weight 170 lb 1.6 oz (77.157 kg), SpO2 98 %.  Intake/Output Summary (Last 24 hours) at 08/28/15 1107 Last data filed at 08/28/15 0934  Gross per 24 hour  Intake     50 ml  Output    300 ml  Net   -250 ml    Telemetry: SR and sinus tachycardia.  GEN: Elderly male, no distress. HEENT: Conjunctiva and lids normal, oropharynx  clear. Neck: Supple, no elevated JVP or carotid bruits, no thyromegaly. Lungs: Diminished breath sounds, no wheezing. Cardiac: Regular rate and rhythm, no S3, soft systolic murmur, no pericardial rub. Abdomen: Soft, nontender, bowel sounds present, no guarding or rebound. Extremities: Left foot wrapped in Ace bandage from the knee to the foot, right leg with external support and pins, with dressing around his foot, mild nonpitting edema of the foot. Skin: Warm and dry. Musculoskeletal: No kyphosis. Neuropsychiatric: Alert and oriented x3, occasionally tearful when discussing situation.  Prior Cardiac Testing/Procedures\  Cardiac Cath 01/27/2011  1. On plain fluoroscopy, there was extensive calcification of  coronaries. 2. The left main is intact. 3. The  LAD has about 70% narrowing, is diffusely diseased and then is  totally occluded after the septal. The septal has 80-90% proximal  narrowing. The tiny diagonals with the third diagonal have some  ostial disease. 4. The internal mammary to distal left anterior descending artery  supplies the distal left anterior descending artery nicely. There  are mild luminal irregularities down to the apex where there is  probably a focal eccentric stenosis prior to the bifurcation of  about 80% to the distal tip. 5. The circumflex has about 40% proximal narrowing, it does not appear  to be flow-limiting, followed by another 40% leading into a stented  area. There is an A-V collateral that goes to the distal right  coronary. The distal right coronary itself appears to be somewhat  diffusely diseased, particularly in the PDA distribution, but does  fill extensively by the collaterals. The continuation of the  circumflex goes into a marginal. There is an additional marginal  which is occluded and fills by retrograde collaterals, small and  diffusely diseased. The AV circumflex at this point goes  down and  divides into a bifurcating branch distally. The caliber of the  vessel down here is really quite small, segmentally and diffusely  diseased over a long segment, and no bigger than about 1.5 to 2 mm  in size. 6. The saphenous vein graft to the obtuse marginal is occluded. 7. The saphenous vein graft to the PDA is occluded. 8. The native RCA is occluded. 9. Ventriculography in the RAO projection reveals if anything only  mild hypokinesis of the apex but otherwise normal wall motion,  ejection fraction to be estimated at 50%.  CONCLUSION: 1. Patent internal mammary to the LAD. 2. Occluded saphenous vein grafts to the obtuse marginal and RCA. 3. Severe native circumflex disease involves the very distal portion.  There is a 90% stenosis and very diffuse disease with very small-  caliber vessels. As a result, we would recommend continued medical  therapy. The distal targets are not ideal for either bypass or  stenting. The circumflex itself has bifurcational disease, it is  very small in caliber. The lesions are quite long and he is  diabetic. A good angiographic result from stenting would be  unlikely on a long-term basis. Hence continued medical therapy  would be recommended.  Echocardiogram 02/06/2015 Left ventricle: The cavity size was normal. Wall thickness was increased in a pattern of mild LVH. Systolic function was normal. The estimated ejection fraction was in the range of 60% to 65%. Wall motion was normal; there were no regional wall motion abnormalities. Doppler parameters are consistent with abnormal left ventricular relaxation (grade 1 diastolic dysfunction). Doppler parameters are consistent with high ventricular filling pressure. - Mitral valve: Calcified annulus. - Left atrium: The atrium was moderately dilated.  Lab Results  Basic Metabolic Panel:  Recent Labs Lab 08/27/15 1609  08/28/15 0437  NA 137 137  K 4.1 4.5  CL 102 105  CO2 25 23  GLUCOSE 149* 130*  BUN 14 12  CREATININE 1.06 1.01  CALCIUM 8.9 8.7*    CBC:  Recent Labs Lab 08/27/15 1609 08/28/15 0437  WBC 11.4* 9.8  HGB 9.4* 8.6*  HCT 31.1* 27.6*  MCV 84.5 84.4  PLT 485* 501*    Cardiac Enzymes:  Recent Labs Lab 08/27/15 2224 08/28/15 0110 08/28/15 0435  TROPONINI 0.13* 0.14* 0.18*    Radiology: Dg Chest Portable 1 View  08/27/2015  CLINICAL DATA:  Chest pain EXAM: PORTABLE CHEST  1 VIEW COMPARISON:  04/03/2015 FINDINGS: Cardiac shadow is within normal limits. Postsurgical changes are noted. Aortic calcifications are again seen. Lungs are well-aerated without focal infiltrate or sizable effusion. No acute bony abnormality is seen. IMPRESSION: No active disease. Electronically Signed   By: Inez Catalina M.D.   On: 08/27/2015 17:03    ECG: Sinus tachycardia with diffuse ST changes, suggestive of global ischemia.  Impression and Recommendations  1. Chest pain: Pain began yesterday morning after getting up to use the bathroom. Associated with shortness of breath described as vice-like , with radiation to his left arm, lasting approximately 3 hours prior to being seen in the emergency room. Relief was provided with sublingual nitroglycerin and morphine. The patient also had recurrence of chest tightness without radiation to his left arm , relieved with GI cocktail. He states sometimes that the chest discomfort is associated with emotional upset.    Troponins are mildly elevated 0.13, 0.14, 0.18, likely related to demand ischemia in the setting of tachycardia and anemia.  EKG does show some inferolateral ST depression , but is unchanged compared to EKG in July 2006. We will repeat echocardiogram for changes in LV systolic function, or wall motion abnormalities. He is on a beta blocker , metoprolol 75 mg twice a day. Cannot rule out bronchospasms.  Heart rate is elevated on my assessment, which  may be related to emotional upset. We will add Ranexa 500 mg twice a day, continue isosorbide, ASA and verapamil. May need to increase verapamil dose and discontinue beta blocker. Will discuss with Dr. Domenic Polite. We will make further recommendations based upon echo results.  2. CAD: History of coronary artery bypass grafting in 2005, with repeat cardiac catheterization in 2012 showing graft disease, with no target vessels for intervention, and continued medical therapy recommended.  3. COPD: He is uncertain if his chest tightness is related to his COPD, as this is very similar to when he has exacerbations. He is currently on of Inderal inhalers, along with Symbicort. O2 sats are within normal limits. On O2 2 L.  4. Low-grade fever: Uncertain etiology, MAXIMUM TEMPERATURE 100.1. Currently 99.2. Consider blood cultures. Defer to hospitalist service.   5. Anemia: Hemoglobin 8.6 with hematocrit of 27.6 on labs this a.m.Marland Kitchen Review of past labs demonstrated a hemoglobin of 9.4 with hematocrit of 31.1 on 02/12/201, with prior hemoglobin and hematocrit normal. On July 2016 at 12.6 and 38.6. We do anemia profile, and Hemoccult stool to evaluate for occult blood. With CAD, preferred to have hemoglobin greater than 8   6. Depression and anxiety: Has become very tearful during my evaluation and began to complain of some chest tightness at that time. Consider low-dose antidepressant versus psych evaluation. Defer to hospitalist service.   7. History of recurrent pulmonary emboli: Now on Xarelto.   Signed: Phill Myron. Lawrence NP [place]  08/28/2015, 11:07 AM Co-Sign MD   Attending note:  Patient seen and examined. Reviewed records and modified above note by Ms. Lawrence NP. Henry [name] is a medically complex patient of mine. I am familiar with his history and last saw him in the office in August 2016. He is currently in [facility] undergoing rehabilitation having undergone extensive right leg surgery, still has  external fixator with pins in place. He is not ambulatory at this time. He is admitted to the hospital for observation with recent recurrent chest discomfort described as a tightness. He has a history of multivessel CAD status post CABG in 2005, DES to the  circumflex in 2006, and subsequent documentation of occluded circumflex as well as SVGs by cardiac catheterization in 2012. He did not have good targets for revascularization and medical therapy was recommended.  On examination this morning he appears comfortable, heart rate 90 to 100 range in sinus rhythm, systolic blood pressure [id number]. Lungs exhibit diminished breath sounds with scattered rhonchi, no wheezes. Cardiac exam reveals RRR with soft systolic murmur and no gallop. He has an external fixator with pins stabilizing the right lower leg. Lab work reveals potassium 4.5, creatinine 1.0, peak troponin I 0.8, hemoglobin 8.6, platelets 501. I reviewed his ECG and chest x-ray. Tracing shows chronic diffuse ST segment abnormalities, global ischemic change not excluded. Chest x-ray did not report any acute findings.  Recurrent chest pain, angina suspected in light of his substrate. Based on last cardiac catheterization with documented graft disease and occluded native circumflex, medical therapy will be pursued since revascularization options were suboptimal. I reviewed his medications. Would stop aspirin since he is concurrently on Xarelto. Continue Lipitor, Lasix, Cozaar, Lopressor, and verapamil. Will adjust Imdur dose and add Ranexa. Echocardiogram has been ordered, will follow up on results, although anticipate conservative management at this time.  Satira Sark, M.D., F.A.C.C.

## 2015-08-28 NOTE — Progress Notes (Signed)
[facility] PROGRESS NOTE  Henry [name] [id number] DOB: [birth year] DOA: 08/27/2015 PCP: Sherrie Mustache, MD  Assessment/Plan: 1. Chest pain: with heart score of 5. [name] has a history of multi vessel disease status post coronary artery bypass grafting 3 in 2005, with placement of drug-eluting stent to circumflex in 2006, last card catheterization performed on 01/28/2011 that showed occluded venous grafts as well as severe circumflex disease. Cardiology recommended medical management at that time due to diffuse disease with very small caliber vessels. Troponin mildly elevated; 2-d echo ordered but pending. Per cardiology medical management imperative. Imdur will be adjusted and ranexa started. Continue statins, b-blocker and cozaar. No ASA since he is on xarelto. Will monitor and follow clinical response. 2. History of recurrent pulmonary embolism. Patient having a history of PEs as well as positive lupus anticoagulant for which he is on chronic anticoagulation with Xarelto. No SOB. 3. History of right Achilles tendon rupture. He is receiving care at [facility], currently has an external fixator in place. He has a follow-up visit with his orthopedic surgeon this Thursday. No weight bearing. Continue supportive care and PRN pain meds  4. Type 2 diabetes mellitus. Blood sugars stable on presentation; will provide sliding scale insulin with Accu-Cheks. Continue Januvia 25 mg by mouth daily (or equivalent on formulary).  5. Hypertension. Stable. Will continue home antihypertensive regimen  6. Dyslipidemia/HLD. Continue statin therapy 7. Chronic obstructive pulmonary disease. Currently stable. Continue home medication regimen. CXR w/o acute cardiopulmonary process.  8. Depression/anxiety: will start low dose zoloft. 9. DVT prophylaxis. Patient fully anticoagulated with Xarelto  Code Status: Full Family Communication: wife at bedside  Disposition Plan: back to SNF when  medically stable; hopefully in the next 24-48 hours.   Consultants:  Cardiology service   Procedures:  2-D echo: pending   Antibiotics:  None   HPI/Subjective: Afebrile, still with some chest tightness sensation. Denies SOB and orthopnea.  Objective: Filed Vitals:   08/28/15 1100 08/28/15 1401  BP: 161/88 103/58  Pulse: 101 75  Temp:  98.4 F (36.9 C)  Resp:  20    Intake/Output Summary (Last 24 hours) at 08/28/15 1752 Last data filed at 08/28/15 1230  Gross per 24 hour  Intake    170 ml  Output    300 ml  Net   -130 ml   Filed Weights   08/27/15 2120  Weight: 77.157 kg (170 lb 1.6 oz)    Exam:   General:  Afebrile, denies nausea or vomiting; currently with chest tightness sensation; overnight with chest pain episodes requiring NTG paste. Patient frustrated with recent health journey and SNF admission.  Cardiovascular: S1 and s2, no rubs or gallops  Respiratory: good air movement, no wheezing, no crackles   Abdomen: soft, NT, ND, positive BS  Musculoskeletal: trace edema on his LLE, RLE with 1 plus edema and external fixator in place  Data Reviewed: Basic Metabolic Panel:  Recent Labs Lab 08/27/15 1609 08/28/15 0437  NA 137 137  K 4.1 4.5  CL 102 105  CO2 25 23  GLUCOSE 149* 130*  BUN 14 12  CREATININE 1.06 1.01  CALCIUM 8.9 8.7*   CBC:  Recent Labs Lab 08/27/15 1609 08/28/15 0437  WBC 11.4* 9.8  HGB 9.4* 8.6*  HCT 31.1* 27.6*  MCV 84.5 84.4  PLT 485* 501*   Cardiac Enzymes:  Recent Labs Lab 08/27/15 2224 08/28/15 0110 08/28/15 0435  TROPONINI 0.13* 0.14* 0.18*   BNP (last 3 results)  Recent Labs  02/05/15 1735  BNP 120.1*   CBG:  Recent Labs Lab 08/27/15 2325 08/28/15 0753 08/28/15 1121 08/28/15 1631  GLUCAP 129* 163* 133* 167*    Studies: Dg Chest Portable 1 View  08/27/2015  CLINICAL DATA:  Chest pain EXAM: PORTABLE CHEST 1 VIEW COMPARISON:  04/03/2015 FINDINGS: Cardiac shadow is within normal limits.  Postsurgical changes are noted. Aortic calcifications are again seen. Lungs are well-aerated without focal infiltrate or sizable effusion. No acute bony abnormality is seen. IMPRESSION: No active disease. Electronically Signed   By: Inez Catalina M.D.   On: 08/27/2015 17:03    Scheduled Meds: . atorvastatin  80 mg Oral Daily  . budesonide-formoterol  2 puff Inhalation BID  . docusate sodium  100 mg Oral BID  . feeding supplement (ENSURE ENLIVE)  237 mL Oral TID BM  . furosemide  20 mg Oral Daily  . insulin aspart  0-15 Units Subcutaneous TID WC  . insulin aspart  0-5 Units Subcutaneous QHS  . isosorbide mononitrate  120 mg Oral Daily  . lactulose  33.75 g Oral Daily  . linagliptin  5 mg Oral Daily  . losartan  100 mg Oral Daily  . metoprolol  75 mg Oral BID  . montelukast  10 mg Oral QHS  . polyethylene glycol  17 g Oral Daily  . ranolazine  500 mg Oral BID  . rivaroxaban  20 mg Oral Daily  . sodium chloride  1 spray Each Nare QID  . tiotropium  18 mcg Inhalation Daily  . verapamil  240 mg Oral Daily  . zolpidem  5 mg Oral QHS    Time spent: 35 minutes   [name]  [facility] Pager [phone]. If 7PM-7AM, please contact night-coverage at [email or web address], password [email or web address] 08/28/2015, 5:52 PM

## 2015-08-28 NOTE — Progress Notes (Signed)
Called by RN that patient having intermittent chest pain. Patient was again admitted with chest pain, has significant history of CAD.  Lab work revealed troponin went up 0.13. Patient denies pain at this time says that it comes and goes. Chest is clear to auscultation bilaterally EKG showed sinus tachycardia  Plan Called and discussed with cardiology fellow Dr. Claiborne Billings on-call, was recommended to start nitroglycerin drip if patient chest pain does not improve. Patient has not yet received his night dose of metoprolol, will give 1 dose of metoprolol 75 mg tonight. Currently patient is chest pain-free, if pain recurs will transfer to stepdown and start nitroglycerin drip.

## 2015-08-29 ENCOUNTER — Observation Stay (HOSPITAL_BASED_OUTPATIENT_CLINIC_OR_DEPARTMENT_OTHER): Payer: Medicare Other

## 2015-08-29 DIAGNOSIS — E785 Hyperlipidemia, unspecified: Secondary | ICD-10-CM | POA: Diagnosis not present

## 2015-08-29 DIAGNOSIS — R079 Chest pain, unspecified: Secondary | ICD-10-CM

## 2015-08-29 DIAGNOSIS — I25118 Atherosclerotic heart disease of native coronary artery with other forms of angina pectoris: Secondary | ICD-10-CM | POA: Diagnosis not present

## 2015-08-29 DIAGNOSIS — E1159 Type 2 diabetes mellitus with other circulatory complications: Secondary | ICD-10-CM | POA: Insufficient documentation

## 2015-08-29 DIAGNOSIS — E1051 Type 1 diabetes mellitus with diabetic peripheral angiopathy without gangrene: Secondary | ICD-10-CM

## 2015-08-29 DIAGNOSIS — N182 Chronic kidney disease, stage 2 (mild): Secondary | ICD-10-CM | POA: Diagnosis not present

## 2015-08-29 LAB — GLUCOSE, CAPILLARY
GLUCOSE-CAPILLARY: 184 mg/dL — AB (ref 65–99)
GLUCOSE-CAPILLARY: 135 mg/dL — AB (ref 65–99)
Glucose-Capillary: 143 mg/dL — ABNORMAL HIGH (ref 65–99)
Glucose-Capillary: 139 mg/dL — ABNORMAL HIGH (ref 65–99)

## 2015-08-29 MED ORDER — MORPHINE SULFATE (PF) 2 MG/ML IV SOLN
2.0000 mg | INTRAVENOUS | Status: DC | PRN
  Administered 2015-08-29 – 2015-08-30 (×4): 2 mg via INTRAVENOUS
  Filled 2015-08-29 (×5): qty 1

## 2015-08-29 MED ORDER — NITROGLYCERIN 0.4 MG SL SUBL
0.4000 mg | SUBLINGUAL_TABLET | SUBLINGUAL | Status: DC | PRN
Start: 2015-08-29 — End: 2015-08-30
  Administered 2015-08-29: 0.4 mg via SUBLINGUAL
  Filled 2015-08-29: qty 1

## 2015-08-29 MED ORDER — GI COCKTAIL ~~LOC~~
30.0000 mL | Freq: Once | ORAL | Status: AC
  Administered 2015-08-29: 30 mL via ORAL
  Filled 2015-08-29: qty 30

## 2015-08-29 MED ORDER — RANOLAZINE ER 500 MG PO TB12
1000.0000 mg | ORAL_TABLET | Freq: Two times a day (BID) | ORAL | Status: DC
  Administered 2015-08-29 – 2015-08-30 (×2): 1000 mg via ORAL
  Filled 2015-08-29 (×2): qty 2

## 2015-08-29 NOTE — Progress Notes (Signed)
Patient ID: Henry [name], male   DOB: Apr 23, 1941, 75 y.o.   MRN: [id number]    Primary cardiologist:  Subjective:    Chest pain this AM different from symptoms he had prior admission, he describes these symptoms as typical of his heart burn. With eating his eggs this AM had some nausea, difficult to keep down.   Objective:   Temp:  [98.2 F (36.8 C)-98.4 F (36.9 C)] 98.2 F (36.8 C) (02/14 0503) Pulse Rate:  [75-101] 86 (02/14 0503) Resp:  [20] 20 (02/14 0503) BP: (103-161)/(58-88) 122/71 mmHg (02/14 0503) SpO2:  [96 %-99 %] 96 % (02/14 0827) Last BM Date: 08/28/15  Filed Weights   08/27/15 2120  Weight: 170 lb 1.6 oz (77.157 kg)    Intake/Output Summary (Last 24 hours) at 08/29/15 0908 Last data filed at 08/29/15 0503  Gross per 24 hour  Intake    410 ml  Output    250 ml  Net    160 ml    Telemetry: NSR  Exam:  General: NAD  HEENT: sclera clear, throat clear  Resp: CTAB  Cardiac: RRR, no m/r/g, no jvd  GI: abdomen soft, NT, ND  MSK: no LE edema  Neuro: no focal deficits  Psych: appropriate affect Lab Results:  Basic Metabolic Panel:  Recent Labs Lab 08/27/15 1609 08/28/15 0437  NA 137 137  K 4.1 4.5  CL 102 105  CO2 25 23  GLUCOSE 149* 130*  BUN 14 12  CREATININE 1.06 1.01  CALCIUM 8.9 8.7*    Liver Function Tests: No results for input(s): AST, ALT, ALKPHOS, BILITOT, PROT, ALBUMIN in the last 168 hours.  CBC:  Recent Labs Lab 08/27/15 1609 08/28/15 0437  WBC 11.4* 9.8  HGB 9.4* 8.6*  HCT 31.1* 27.6*  MCV 84.5 84.4  PLT 485* 501*    Cardiac Enzymes:  Recent Labs Lab 08/27/15 2224 08/28/15 0110 08/28/15 0435  TROPONINI 0.13* 0.14* 0.18*    BNP: No results for input(s): PROBNP in the last 8760 hours.  Coagulation:  Recent Labs Lab 08/27/15 1609  INR 3.34*    ECG:   Medications:   Scheduled Medications: . atorvastatin  80 mg Oral Daily  . budesonide-formoterol  2 puff Inhalation BID  . docusate sodium   100 mg Oral BID  . feeding supplement (ENSURE ENLIVE)  237 mL Oral TID BM  . furosemide  20 mg Oral Daily  . insulin aspart  0-15 Units Subcutaneous TID WC  . insulin aspart  0-5 Units Subcutaneous QHS  . isosorbide mononitrate  120 mg Oral Daily  . lactulose  33.75 g Oral Daily  . linagliptin  5 mg Oral Daily  . losartan  100 mg Oral Daily  . metoprolol  75 mg Oral BID  . montelukast  10 mg Oral QHS  . pantoprazole  40 mg Oral Daily  . polyethylene glycol  17 g Oral Daily  . ranolazine  500 mg Oral BID  . rivaroxaban  20 mg Oral Daily  . sertraline  25 mg Oral Daily  . sodium chloride  1 spray Each Nare QID  . tiotropium  18 mcg Inhalation Daily  . verapamil  240 mg Oral Daily  . zolpidem  5 mg Oral QHS     Infusions:     PRN Medications:  acetaminophen, albuterol, gi cocktail, HYDROcodone-acetaminophen, ondansetron (ZOFRAN) IV     Assessment/Plan    1. Chest pain - history of CAD with prior CABG in 2005, DES to LCX  in 2006. Cath 2012 showed graft disease with out revasc targets by PCI of CABG. Managed medically, he is followed very closely by his primary cardiologist Dr Domenic Polite who saw him yesterday in for initial consultation as well. His imdur was increased to 120mg  daily and ranexa started - cath 01/2011 LM patent, LAD 70% and occluded more distally, patent LIMA-LAD, severe LCX disease, RCA occluded but fills with collaterals. SVG-OM occluded, SVG-PDA occluded,  - trop up to 0.18, EKG SR with chronic ST/T changes inferior and lateral precordial leads.   - medical therapy with atorva 80, imdur 20, losartan 100, lopressor 75 bid, ranexa 500 bid. No ASA or plavix since on xarelto.   - he describes chest pain this AM he attributes to his heart burn, had some symptoms with eating breakfast. From orignal consult note had some relief with GI cocktail. Will retry GI cocktail this AM, f/u echo results. If pain improved with GI cocktail and echo stable would likely consider  discharge later in the day.    2. History of PE - history of lupus anticoag Ab - on long term anticoag with xarelto    Carlyle Dolly, M.D.

## 2015-08-29 NOTE — Progress Notes (Signed)
Resting quietly with wife at bedside, no complaint of chest discomfort, no respiratory distress.

## 2015-08-29 NOTE — NC FL2 (Signed)
[place] LEVEL OF CARE SCREENING TOOL     IDENTIFICATION  Patient Name: Henry [name] Birthdate: 1940-09-08 Sex: male Admission Date (Current Location): 08/27/2015  [facility] and [place] Number:  [employer] and Address:  [facility] [address] [place]      Provider Number: [id number]  Attending Physician Name and Address:  [name], MD  Relative Name and Phone Number:       Current Level of Care: Hospital Recommended Level of Care: [place] Prior Approval Number:    Date Approved/Denied:   PASRR Number:    Discharge Plan: SNF    Current Diagnoses: Patient Active Problem List   Diagnosis Date Noted  . Chest pain 08/27/2015  . Lactic acidosis   . Troponin level elevated   . Atherosclerosis of native coronary artery of native heart with other form of angina pectoris ([place])   . AKI (acute kidney injury) ([place]) 02/05/2015  . Elevated lactic acid level 02/05/2015  . History of gastrointestinal bleeding 02/05/2015  . Coronary atherosclerosis of native coronary artery   . COPD exacerbation ([place]) 04/12/2014  . COPD, moderate ([place]) 03/15/2014  . 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 Right carotid bruit 12/18/2013  . 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 History of pulmonary embolism   . Type 2 diabetes mellitus ([place])   . Pulmonary nodule   . Lupus anticoagulant positive   . Anemia 12/17/2013  . Long term current use of anticoagulant therapy 12/17/2011  . Dyslipidemia 12/14/2011  . Essential hypertension, benign 04/11/2009    Orientation RESPIRATION BLADDER Height & Weight     Self, Time  O2 (1.5 l) Continent Weight: 170 lb 1.6 oz (77.157 kg) Height:  5\' 8"  (172.7 cm)  BEHAVIORAL SYMPTOMS/MOOD NEUROLOGICAL BOWEL NUTRITION STATUS      Continent Diet (CARB MODIFIED/THIN LIQUIDS)  AMBULATORY STATUS COMMUNICATION OF NEEDS Skin     Verbally Surgical wounds (EXTERNAL FIXATOR TO  RIGHT LEG)                       Personal Care Assistance Level of Assistance  Bathing, Dressing Bathing Assistance: Limited assistance   Dressing Assistance: Limited assistance     Functional Limitations Info             SPECIAL CARE FACTORS FREQUENCY  PT (By licensed PT), OT (By licensed OT)     PT Frequency: 5 OT Frequency: 5            Contractures      Additional Factors Info                  Current Medications (08/29/2015):  This is the current hospital active medication list Current Facility-Administered Medications  Medication Dose Route Frequency Provider Last Rate Last Dose  . acetaminophen (TYLENOL) tablet 650 mg  650 mg Oral Q4H PRN Kelvin Cellar, MD   650 mg at 08/28/15 2139  . albuterol (PROVENTIL) (2.5 MG/3ML) 0.083% nebulizer solution 3 mL  3 mL Inhalation Q6H PRN Kelvin Cellar, MD      . atorvastatin (LIPITOR) tablet 80 mg  80 mg Oral Daily Kelvin Cellar, MD   80 mg at 08/29/15 1045  . budesonide-formoterol (SYMBICORT) 160-4.5 MCG/ACT inhaler 2 puff  2 puff Inhalation BID Kelvin Cellar, MD   2 puff at 08/29/15 0827  . docusate sodium (COLACE) capsule 100 mg  100 mg Oral BID Kelvin Cellar, MD  100 mg at 08/29/15 1049  . feeding supplement (ENSURE ENLIVE) (ENSURE ENLIVE) liquid 237 mL  237 mL Oral TID BM Jeneen Rinks, RD   237 mL at 08/29/15 1400  . furosemide (LASIX) tablet 20 mg  20 mg Oral Daily Kelvin Cellar, MD   20 mg at 08/29/15 1046  . gi cocktail (Maalox,Lidocaine,Donnatal)  30 mL Oral QID PRN Kelvin Cellar, MD   30 mL at 08/27/15 2328  . HYDROcodone-acetaminophen (NORCO) 7.5-325 MG per tablet 1 tablet  1 tablet Oral Q6H PRN Kelvin Cellar, MD   1 tablet at 08/29/15 0213  . insulin aspart (novoLOG) injection 0-15 Units  0-15 Units Subcutaneous TID WC Kelvin Cellar, MD   3 Units at 08/29/15 1200  . insulin aspart (novoLOG) injection 0-5 Units  0-5 Units Subcutaneous QHS Kelvin Cellar, MD   0 Units at 08/27/15 2215   . isosorbide mononitrate (IMDUR) 24 hr tablet 120 mg  120 mg Oral Daily Kelvin Cellar, MD   120 mg at 08/29/15 1044  . lactulose (CHRONULAC) 10 GM/15ML solution 33.75 g  33.75 g Oral Daily Kelvin Cellar, MD   33.75 g at 08/29/15 1044  . linagliptin (TRADJENTA) tablet 5 mg  5 mg Oral Daily Kelvin Cellar, MD   5 mg at 08/29/15 1046  . losartan (COZAAR) tablet 100 mg  100 mg Oral Daily Kelvin Cellar, MD   100 mg at 08/29/15 1046  . metoprolol tartrate (LOPRESSOR) tablet 75 mg  75 mg Oral BID Kelvin Cellar, MD   75 mg at 08/29/15 1048  . montelukast (SINGULAIR) tablet 10 mg  10 mg Oral QHS Kelvin Cellar, MD   10 mg at 08/28/15 2139  . morphine 2 MG/ML injection 2 mg  2 mg Intravenous Q3H PRN [name], MD      . nitroGLYCERIN (NITROSTAT) SL tablet 0.4 mg  0.4 mg Sublingual Q5 min PRN [name], MD   0.4 mg at 08/29/15 1158  . ondansetron (ZOFRAN) injection 4 mg  4 mg Intravenous Q6H PRN Kelvin Cellar, MD      . pantoprazole (PROTONIX) EC tablet 40 mg  40 mg Oral Daily [name], MD   40 mg at 08/29/15 1050  . polyethylene glycol (MIRALAX / GLYCOLAX) packet 17 g  17 g Oral Daily Kelvin Cellar, MD   17 g at 08/29/15 1050  . ranolazine (RANEXA) 12 hr tablet 500 mg  500 mg Oral BID Satira Sark, MD   500 mg at 08/29/15 1044  . rivaroxaban (XARELTO) tablet 20 mg  20 mg Oral Daily Kelvin Cellar, MD   20 mg at 08/29/15 1055  . sertraline (ZOLOFT) tablet 25 mg  25 mg Oral Daily [name], MD   25 mg at 08/29/15 1000  . sodium chloride (OCEAN) 0.65 % nasal spray 1 spray  1 spray Each Nare QID Kelvin Cellar, MD   1 spray at 08/29/15 1055  . tiotropium (SPIRIVA) inhalation capsule 18 mcg  18 mcg Inhalation Daily Kelvin Cellar, MD   18 mcg at 08/29/15 0826  . verapamil (CALAN-SR) CR tablet 240 mg  240 mg Oral Daily Kelvin Cellar, MD   240 mg at 08/29/15 1045  . zolpidem (AMBIEN) tablet 5 mg  5 mg Oral QHS Kelvin Cellar, MD   5 mg at 08/28/15 2139     Discharge  Medications: Please see discharge summary for a list of discharge medications.  Relevant Imaging Results:  Relevant Lab Results:   Additional Information    Eduard Clos  Romilda Garret, LCSW

## 2015-08-29 NOTE — Progress Notes (Signed)
Echo with normal LVEF and no WMAs. Intermittent chest pain throughout the day, we will increase his evening ranexa and follow symptoms overnight, reevluate in AM.   Zandra Abts MD

## 2015-08-29 NOTE — NC FL2 (Deleted)
[place] LEVEL OF CARE SCREENING TOOL     IDENTIFICATION  Patient Name: Henry [name] Birthdate: 1940/09/24 Sex: male Admission Date (Current Location): 08/27/2015  [facility] and [place] Number:  [employer] and Address:  [facility] [address] [place]      Provider Number: [id number]  Attending Physician Name and Address:  [name], MD  Relative Name and Phone Number:       Current Level of Care: Hospital Recommended Level of Care: [place] Prior Approval Number:    Date Approved/Denied:   PASRR Number:    Discharge Plan: SNF    Current Diagnoses: Patient Active Problem List   Diagnosis Date Noted  . Chest pain 08/27/2015  . Lactic acidosis   . Troponin level elevated   . Atherosclerosis of native coronary artery of native heart with other form of angina pectoris ([place])   . AKI (acute kidney injury) ([place]) 02/05/2015  . Elevated lactic acid level 02/05/2015  . History of gastrointestinal bleeding 02/05/2015  . Coronary atherosclerosis of native coronary artery   . COPD exacerbation ([place]) 04/12/2014  . COPD, moderate ([place]) 03/15/2014  . 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 Right carotid bruit 12/18/2013  . 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 History of pulmonary embolism   . Type 2 diabetes mellitus ([place])   . Pulmonary nodule   . Lupus anticoagulant positive   . Anemia 12/17/2013  . Long term current use of anticoagulant therapy 12/17/2011  . Dyslipidemia 12/14/2011  . Essential hypertension, benign 04/11/2009    Orientation RESPIRATION BLADDER Height & Weight     Self, Time  O2 (1.5 l) Continent Weight: 170 lb 1.6 oz (77.157 kg) Height:  5\' 8"  (172.7 cm)  BEHAVIORAL SYMPTOMS/MOOD NEUROLOGICAL BOWEL NUTRITION STATUS      Continent Diet (CARB MODIFIED/THIN LIQUIDS)  AMBULATORY STATUS COMMUNICATION OF NEEDS Skin     Verbally Normal                        Personal Care Assistance Level of Assistance  Bathing, Dressing Bathing Assistance: Limited assistance   Dressing Assistance: Limited assistance     Functional Limitations Info             SPECIAL CARE FACTORS FREQUENCY  PT (By licensed PT), OT (By licensed OT)     PT Frequency: 5 OT Frequency: 5            Contractures      Additional Factors Info                  Current Medications (08/29/2015):  This is the current hospital active medication list Current Facility-Administered Medications  Medication Dose Route Frequency Provider Last Rate Last Dose  . acetaminophen (TYLENOL) tablet 650 mg  650 mg Oral Q4H PRN Kelvin Cellar, MD   650 mg at 08/28/15 2139  . albuterol (PROVENTIL) (2.5 MG/3ML) 0.083% nebulizer solution 3 mL  3 mL Inhalation Q6H PRN Kelvin Cellar, MD      . atorvastatin (LIPITOR) tablet 80 mg  80 mg Oral Daily Kelvin Cellar, MD   80 mg at 08/29/15 1045  . budesonide-formoterol (SYMBICORT) 160-4.5 MCG/ACT inhaler 2 puff  2 puff Inhalation BID Kelvin Cellar, MD   2 puff at 08/29/15 0827  . docusate sodium (COLACE) capsule 100 mg  100 mg Oral BID Kelvin Cellar, MD   100 mg at 08/29/15 1049  .  feeding supplement (ENSURE ENLIVE) (ENSURE ENLIVE) liquid 237 mL  237 mL Oral TID BM Jeneen Rinks, RD   237 mL at 08/29/15 1400  . furosemide (LASIX) tablet 20 mg  20 mg Oral Daily Kelvin Cellar, MD   20 mg at 08/29/15 1046  . gi cocktail (Maalox,Lidocaine,Donnatal)  30 mL Oral QID PRN Kelvin Cellar, MD   30 mL at 08/27/15 2328  . HYDROcodone-acetaminophen (NORCO) 7.5-325 MG per tablet 1 tablet  1 tablet Oral Q6H PRN Kelvin Cellar, MD   1 tablet at 08/29/15 0213  . insulin aspart (novoLOG) injection 0-15 Units  0-15 Units Subcutaneous TID WC Kelvin Cellar, MD   3 Units at 08/29/15 1200  . insulin aspart (novoLOG) injection 0-5 Units  0-5 Units Subcutaneous QHS Kelvin Cellar, MD   0 Units at 08/27/15 2215  . isosorbide mononitrate (IMDUR) 24  hr tablet 120 mg  120 mg Oral Daily Kelvin Cellar, MD   120 mg at 08/29/15 1044  . lactulose (CHRONULAC) 10 GM/15ML solution 33.75 g  33.75 g Oral Daily Kelvin Cellar, MD   33.75 g at 08/29/15 1044  . linagliptin (TRADJENTA) tablet 5 mg  5 mg Oral Daily Kelvin Cellar, MD   5 mg at 08/29/15 1046  . losartan (COZAAR) tablet 100 mg  100 mg Oral Daily Kelvin Cellar, MD   100 mg at 08/29/15 1046  . metoprolol tartrate (LOPRESSOR) tablet 75 mg  75 mg Oral BID Kelvin Cellar, MD   75 mg at 08/29/15 1048  . montelukast (SINGULAIR) tablet 10 mg  10 mg Oral QHS Kelvin Cellar, MD   10 mg at 08/28/15 2139  . morphine 2 MG/ML injection 2 mg  2 mg Intravenous Q3H PRN [name], MD      . nitroGLYCERIN (NITROSTAT) SL tablet 0.4 mg  0.4 mg Sublingual Q5 min PRN [name], MD   0.4 mg at 08/29/15 1158  . ondansetron (ZOFRAN) injection 4 mg  4 mg Intravenous Q6H PRN Kelvin Cellar, MD      . pantoprazole (PROTONIX) EC tablet 40 mg  40 mg Oral Daily [name], MD   40 mg at 08/29/15 1050  . polyethylene glycol (MIRALAX / GLYCOLAX) packet 17 g  17 g Oral Daily Kelvin Cellar, MD   17 g at 08/29/15 1050  . ranolazine (RANEXA) 12 hr tablet 500 mg  500 mg Oral BID Satira Sark, MD   500 mg at 08/29/15 1044  . rivaroxaban (XARELTO) tablet 20 mg  20 mg Oral Daily Kelvin Cellar, MD   20 mg at 08/29/15 1055  . sertraline (ZOLOFT) tablet 25 mg  25 mg Oral Daily [name], MD   25 mg at 08/29/15 1000  . sodium chloride (OCEAN) 0.65 % nasal spray 1 spray  1 spray Each Nare QID Kelvin Cellar, MD   1 spray at 08/29/15 1055  . tiotropium (SPIRIVA) inhalation capsule 18 mcg  18 mcg Inhalation Daily Kelvin Cellar, MD   18 mcg at 08/29/15 0826  . verapamil (CALAN-SR) CR tablet 240 mg  240 mg Oral Daily Kelvin Cellar, MD   240 mg at 08/29/15 1045  . zolpidem (AMBIEN) tablet 5 mg  5 mg Oral QHS Kelvin Cellar, MD   5 mg at 08/28/15 2139     Discharge Medications: Please see discharge  summary for a list of discharge medications.  Relevant Imaging Results:  Relevant Lab Results:   Additional Information    [name], LCSW

## 2015-08-29 NOTE — Clinical Social Work Note (Signed)
Clinical Social Work Assessment  Patient Details  Name: Henry [name] MRN: [id number] Date of Birth: 11-29-1940  Date of referral:  08/29/15               Reason for consult:  Facility Placement                Permission sought to share information with:  Family Supports Permission granted to share information::     Name::        Agency::     Relationship::     Contact Information:     Housing/Transportation Living arrangements for the past 2 months:  [place] of Information:  Patient, Spouse Patient Interpreter Needed:  None Criminal Activity/Legal Involvement Pertinent to Current Situation/Hospitalization:  No - Comment as needed Significant Relationships:  Adult Children, Spouse, Community Support Lives with:  Facility Resident Do you feel safe going back to the place where you live?  Yes Need for family participation in patient care:  Yes (Comment)  Care giving concerns:  Patient admitted from [facility] where he is for SNF rehab.   Social Worker assessment / plan:  Patient admitted from [facility] where he has been staying for rehab with his external fixator to leg. He plans to return to SNF at dc- FL2 completed and plans confirmed with SNF.   Employment status:  Retired Nurse, adult PT Recommendations:  [place] / Referral to community resources:  [place]  Patient/Family's Response to care:  Patient and wife agree to SNF return plans.  Patient/Family's Understanding of and Emotional Response to Diagnosis, Current Treatment, and Prognosis:  Optimistic that the external fixator will be removed soon-   Emotional Assessment Appearance:  Developmentally appropriate Attitude/Demeanor/Rapport:    Affect (typically observed):  Accepting, Appropriate Orientation:  Oriented to Self, Oriented to Place, Oriented to  Time, Oriented to Situation Alcohol / Substance use:   Not Applicable Psych involvement (Current and /or in the community):  No (Comment)  Discharge Needs  Concerns to be addressed:  Discharge Planning Concerns Readmission within the last 30 days:  No Current discharge risk:  None Barriers to Discharge:  No Barriers Identified   [name], LCSW 08/29/2015, 12:24 PM

## 2015-08-29 NOTE — Progress Notes (Signed)
[facility] PROGRESS NOTE  Henry [name] [id number] DOB: 1941/05/16 DOA: 08/27/2015 PCP: Sherrie Mustache, MD  Assessment/Plan: 1. Chest pain: with heart score of 5. 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 Cardiology recommended medical management at that time due to diffuse disease with very small caliber vessels. Troponin mildly elevated; 2-d echo ordered and demonstrating no wall motion abnormalities and preserved EF. Per cardiology rec's, medical management imperative. Imdur will be adjusted and ranexa dose re-adjusted; will follow their reccommendations. Continue statins, b-blocker and cozaar. No ASA since he is on xarelto. Will monitor and follow clinical response. 2. History of recurrent pulmonary embolism. Patient having a history of PEs as well as positive lupus anticoagulant for which he is on chronic anticoagulation with Xarelto. No SOB. 3. History of right Achilles tendon rupture. He is receiving care at [facility], currently has an external fixator in place. He has a follow-up visit with his orthopedic surgeon this Thursday. No weight bearing. Continue supportive care and PRN pain meds  4. Type 2 diabetes mellitus. Blood sugars stable on presentation; will provide sliding scale insulin with Accu-Cheks. Continue Januvia 25 mg by mouth daily (or equivalent on formulary).  5. Hypertension. Elevated during episodes of CP. Will continue home antihypertensive regimen. Monitor VS, plenty of room for medication adjustments to help his CP. 6. Dyslipidemia/HLD. Continue statin therapy 7. Chronic obstructive pulmonary disease. Currently stable. Continue home medication regimen. CXR w/o acute cardiopulmonary process.   8. Depression/anxiety: Started on low dose zoloft. 9. DVT prophylaxis. Patient fully anticoagulated with Xarelto  Code Status: Full Family Communication: wife at bedside  Disposition Plan: back to SNF when medically stable; hopefully in the next 24-48 hours.   Consultants:  Cardiology service   Procedures:  2-D echo: no WMA's, preserved EF  Antibiotics:  None   HPI/Subjective: Afebrile, still with some chest tightness and CP requiring NTG and morphine. No SOB or orthopnea.  Objective: Filed Vitals:   08/29/15 1456 08/29/15 2008  BP: 131/77 133/68  Pulse: 92 84  Temp: 99.4 F (37.4 C) 98.5 F (36.9 C)  Resp: 16 18    Intake/Output Summary (Last 24 hours) at 08/29/15 2056 Last data filed at 08/29/15 1915  Gross per 24 hour  Intake    360 ml  Output    450 ml  Net    -90 ml   Filed Weights   08/27/15 2120  Weight: 77.157 kg (170 lb 1.6 oz)    Exam:   General:  Afebrile, denies nausea or vomiting; Has continue experiencing intermittent episodes of CP/chest tightness. Pain relived with NTG and morphine.  Cardiovascular: S1 and s2, no rubs or gallops  Respiratory: good air movement, no wheezing, no crackles   Abdomen: soft, NT, ND, positive BS  Musculoskeletal: trace edema on his LLE, RLE with 1 plus edema and external fixator in place  Data Reviewed: Basic Metabolic Panel:  Recent Labs Lab 08/27/15 1609 08/28/15 0437  NA 137 137  K 4.1 4.5  CL 102 105  CO2 25 23  GLUCOSE 149* 130*  BUN 14 12  CREATININE 1.06 1.01  CALCIUM 8.9 8.7*   CBC:  Recent Labs Lab 08/27/15 1609 08/28/15 0437  WBC 11.4* 9.8  HGB 9.4* 8.6*  HCT 31.1* 27.6*  MCV 84.5 84.4  PLT 485* 501*   Cardiac Enzymes:  Recent Labs Lab 08/27/15 2224 08/28/15 0110 08/28/15 0435  TROPONINI 0.13* 0.14* 0.18*   BNP (last 3 results)  Recent Labs  02/05/15 1735  BNP 120.1*   CBG:  Recent Labs Lab 08/28/15 2120 08/29/15 0744 08/29/15 1126 08/29/15 1659  08/29/15 2008  GLUCAP 131* 143* 184* 139* 135*    Studies: No results found.  Scheduled Meds: . atorvastatin  80 mg Oral Daily  . budesonide-formoterol  2 puff Inhalation BID  . docusate sodium  100 mg Oral BID  . feeding supplement (ENSURE ENLIVE)  237 mL Oral TID BM  . furosemide  20 mg Oral Daily  . insulin aspart  0-15 Units Subcutaneous TID WC  . insulin aspart  0-5 Units Subcutaneous QHS  . isosorbide mononitrate  120 mg Oral Daily  . lactulose  33.75 g Oral Daily  . linagliptin  5 mg Oral Daily  . losartan  100 mg Oral Daily  . metoprolol  75 mg Oral BID  . montelukast  10 mg Oral QHS  . pantoprazole  40 mg Oral Daily  . polyethylene glycol  17 g Oral Daily  . ranolazine  1,000 mg Oral BID  . rivaroxaban  20 mg Oral Daily  . sertraline  25 mg Oral Daily  . sodium chloride  1 spray Each Nare QID  . tiotropium  18 mcg Inhalation Daily  . verapamil  240 mg Oral Daily  . zolpidem  5 mg Oral QHS    Time spent: 35 minutes   Henry [name]  [facility] Pager [phone]. If 7PM-7AM, please contact night-coverage at [email or web address], password [facility] 08/29/2015, 8:56 PM

## 2015-08-30 ENCOUNTER — Emergency Department (HOSPITAL_COMMUNITY): Payer: Medicare Other

## 2015-08-30 ENCOUNTER — Inpatient Hospital Stay (HOSPITAL_COMMUNITY)
Admission: EM | Admit: 2015-08-30 | Discharge: 2015-09-06 | DRG: 069 | Disposition: A | Discharge status: Skilled Nursing Facility | Payer: Medicare Other | Attending: Family Medicine | Admitting: Family Medicine

## 2015-08-30 ENCOUNTER — Encounter (HOSPITAL_COMMUNITY): Payer: Self-pay | Admitting: Emergency Medicine

## 2015-08-30 DIAGNOSIS — F329 Major depressive disorder, single episode, unspecified: Secondary | ICD-10-CM | POA: Diagnosis present

## 2015-08-30 DIAGNOSIS — Z955 Presence of coronary angioplasty implant and graft: Secondary | ICD-10-CM

## 2015-08-30 DIAGNOSIS — I25110 Atherosclerotic heart disease of native coronary artery with unstable angina pectoris: Secondary | ICD-10-CM | POA: Diagnosis not present

## 2015-08-30 DIAGNOSIS — I130 Hypertensive heart and chronic kidney disease with heart failure and stage 1 through stage 4 chronic kidney disease, or unspecified chronic kidney disease: Secondary | ICD-10-CM | POA: Diagnosis present

## 2015-08-30 DIAGNOSIS — Z7984 Long term (current) use of oral hypoglycemic drugs: Secondary | ICD-10-CM | POA: Diagnosis not present

## 2015-08-30 DIAGNOSIS — I249 Acute ischemic heart disease, unspecified: Secondary | ICD-10-CM | POA: Diagnosis not present

## 2015-08-30 DIAGNOSIS — R911 Solitary pulmonary nodule: Secondary | ICD-10-CM | POA: Diagnosis present

## 2015-08-30 DIAGNOSIS — N179 Acute kidney failure, unspecified: Secondary | ICD-10-CM | POA: Diagnosis present

## 2015-08-30 DIAGNOSIS — D508 Other iron deficiency anemias: Secondary | ICD-10-CM | POA: Diagnosis not present

## 2015-08-30 DIAGNOSIS — Z951 Presence of aortocoronary bypass graft: Secondary | ICD-10-CM

## 2015-08-30 DIAGNOSIS — Z22322 Carrier or suspected carrier of Methicillin resistant Staphylococcus aureus: Secondary | ICD-10-CM

## 2015-08-30 DIAGNOSIS — D7589 Other specified diseases of blood and blood-forming organs: Secondary | ICD-10-CM | POA: Diagnosis present

## 2015-08-30 DIAGNOSIS — S82899A Other fracture of unspecified lower leg, initial encounter for closed fracture: Secondary | ICD-10-CM

## 2015-08-30 DIAGNOSIS — E1122 Type 2 diabetes mellitus with diabetic chronic kidney disease: Secondary | ICD-10-CM | POA: Diagnosis present

## 2015-08-30 DIAGNOSIS — D649 Anemia, unspecified: Secondary | ICD-10-CM | POA: Diagnosis present

## 2015-08-30 DIAGNOSIS — I5032 Chronic diastolic (congestive) heart failure: Secondary | ICD-10-CM | POA: Diagnosis present

## 2015-08-30 DIAGNOSIS — E871 Hypo-osmolality and hyponatremia: Secondary | ICD-10-CM | POA: Diagnosis present

## 2015-08-30 DIAGNOSIS — J449 Chronic obstructive pulmonary disease, unspecified: Secondary | ICD-10-CM | POA: Diagnosis present

## 2015-08-30 DIAGNOSIS — N182 Chronic kidney disease, stage 2 (mild): Secondary | ICD-10-CM

## 2015-08-30 DIAGNOSIS — Z79899 Other long term (current) drug therapy: Secondary | ICD-10-CM

## 2015-08-30 DIAGNOSIS — G459 Transient cerebral ischemic attack, unspecified: Secondary | ICD-10-CM | POA: Diagnosis present

## 2015-08-30 DIAGNOSIS — I639 Cerebral infarction, unspecified: Secondary | ICD-10-CM | POA: Diagnosis present

## 2015-08-30 DIAGNOSIS — R079 Chest pain, unspecified: Secondary | ICD-10-CM

## 2015-08-30 DIAGNOSIS — F17220 Nicotine dependence, chewing tobacco, uncomplicated: Secondary | ICD-10-CM | POA: Diagnosis present

## 2015-08-30 DIAGNOSIS — J441 Chronic obstructive pulmonary disease with (acute) exacerbation: Secondary | ICD-10-CM | POA: Diagnosis present

## 2015-08-30 DIAGNOSIS — Z8249 Family history of ischemic heart disease and other diseases of the circulatory system: Secondary | ICD-10-CM

## 2015-08-30 DIAGNOSIS — Z8673 Personal history of transient ischemic attack (TIA), and cerebral infarction without residual deficits: Secondary | ICD-10-CM | POA: Clinically undetermined

## 2015-08-30 DIAGNOSIS — I25119 Atherosclerotic heart disease of native coronary artery with unspecified angina pectoris: Secondary | ICD-10-CM | POA: Diagnosis present

## 2015-08-30 DIAGNOSIS — E785 Hyperlipidemia, unspecified: Secondary | ICD-10-CM | POA: Diagnosis present

## 2015-08-30 DIAGNOSIS — N189 Chronic kidney disease, unspecified: Secondary | ICD-10-CM | POA: Diagnosis not present

## 2015-08-30 DIAGNOSIS — K219 Gastro-esophageal reflux disease without esophagitis: Secondary | ICD-10-CM | POA: Diagnosis present

## 2015-08-30 DIAGNOSIS — S86011D Strain of right Achilles tendon, subsequent encounter: Secondary | ICD-10-CM | POA: Diagnosis not present

## 2015-08-30 DIAGNOSIS — I10 Essential (primary) hypertension: Secondary | ICD-10-CM | POA: Diagnosis present

## 2015-08-30 DIAGNOSIS — G4733 Obstructive sleep apnea (adult) (pediatric): Secondary | ICD-10-CM | POA: Diagnosis present

## 2015-08-30 DIAGNOSIS — I6521 Occlusion and stenosis of right carotid artery: Secondary | ICD-10-CM | POA: Diagnosis not present

## 2015-08-30 DIAGNOSIS — Z888 Allergy status to other drugs, medicaments and biological substances status: Secondary | ICD-10-CM | POA: Diagnosis not present

## 2015-08-30 DIAGNOSIS — I252 Old myocardial infarction: Secondary | ICD-10-CM | POA: Diagnosis not present

## 2015-08-30 DIAGNOSIS — I25118 Atherosclerotic heart disease of native coronary artery with other forms of angina pectoris: Secondary | ICD-10-CM | POA: Diagnosis not present

## 2015-08-30 DIAGNOSIS — I209 Angina pectoris, unspecified: Secondary | ICD-10-CM | POA: Diagnosis not present

## 2015-08-30 DIAGNOSIS — Z7901 Long term (current) use of anticoagulants: Secondary | ICD-10-CM

## 2015-08-30 DIAGNOSIS — I739 Peripheral vascular disease, unspecified: Secondary | ICD-10-CM | POA: Diagnosis present

## 2015-08-30 DIAGNOSIS — D473 Essential (hemorrhagic) thrombocythemia: Secondary | ICD-10-CM | POA: Diagnosis present

## 2015-08-30 DIAGNOSIS — R760 Raised antibody titer: Secondary | ICD-10-CM | POA: Diagnosis present

## 2015-08-30 DIAGNOSIS — J45909 Unspecified asthma, uncomplicated: Secondary | ICD-10-CM | POA: Diagnosis present

## 2015-08-30 DIAGNOSIS — Z86711 Personal history of pulmonary embolism: Secondary | ICD-10-CM

## 2015-08-30 DIAGNOSIS — I25111 Atherosclerotic heart disease of native coronary artery with angina pectoris with documented spasm: Secondary | ICD-10-CM | POA: Diagnosis not present

## 2015-08-30 DIAGNOSIS — F419 Anxiety disorder, unspecified: Secondary | ICD-10-CM | POA: Diagnosis present

## 2015-08-30 DIAGNOSIS — N183 Chronic kidney disease, stage 3 unspecified: Secondary | ICD-10-CM | POA: Diagnosis present

## 2015-08-30 DIAGNOSIS — I63233 Cerebral infarction due to unspecified occlusion or stenosis of bilateral carotid arteries: Secondary | ICD-10-CM

## 2015-08-30 DIAGNOSIS — I6523 Occlusion and stenosis of bilateral carotid arteries: Secondary | ICD-10-CM | POA: Diagnosis present

## 2015-08-30 DIAGNOSIS — R778 Other specified abnormalities of plasma proteins: Secondary | ICD-10-CM | POA: Diagnosis present

## 2015-08-30 DIAGNOSIS — R7989 Other specified abnormal findings of blood chemistry: Secondary | ICD-10-CM

## 2015-08-30 DIAGNOSIS — D75839 Thrombocytosis, unspecified: Secondary | ICD-10-CM | POA: Diagnosis present

## 2015-08-30 LAB — CBG MONITORING, ED: Glucose-Capillary: 147 mg/dL — ABNORMAL HIGH (ref 65–99)

## 2015-08-30 LAB — COMPREHENSIVE METABOLIC PANEL
ALT: 15 U/L — AB (ref 17–63)
AST: 25 U/L (ref 15–41)
Albumin: 2.5 g/dL — ABNORMAL LOW (ref 3.5–5.0)
Alkaline Phosphatase: 57 U/L (ref 38–126)
Anion gap: 11 (ref 5–15)
BUN: 22 mg/dL — AB (ref 6–20)
CHLORIDE: 98 mmol/L — AB (ref 101–111)
CO2: 24 mmol/L (ref 22–32)
Calcium: 8.5 mg/dL — ABNORMAL LOW (ref 8.9–10.3)
Creatinine, Ser: 1.65 mg/dL — ABNORMAL HIGH (ref 0.61–1.24)
GFR calc Af Amer: 45 mL/min — ABNORMAL LOW (ref >60)
GFR, EST NON AFRICAN AMERICAN: 39 mL/min — AB (ref >60)
Glucose, Bld: 177 mg/dL — ABNORMAL HIGH (ref 65–99)
POTASSIUM: 4.9 mmol/L (ref 3.5–5.1)
SODIUM: 133 mmol/L — AB (ref 135–145)
Total Bilirubin: 0.4 mg/dL (ref 0.3–1.2)
Total Protein: 7.1 g/dL (ref 6.5–8.1)

## 2015-08-30 LAB — CBC
HEMATOCRIT: 26.3 % — AB (ref 39.0–52.0)
HEMATOCRIT: 27.1 % — AB (ref 39.0–52.0)
Hemoglobin: 8.2 g/dL — ABNORMAL LOW (ref 13.0–17.0)
Hemoglobin: 8.3 g/dL — ABNORMAL LOW (ref 13.0–17.0)
MCH: 25.9 pg — AB (ref 26.0–34.0)
MCH: 25.9 pg — ABNORMAL LOW (ref 26.0–34.0)
MCHC: 31.2 g/dL (ref 30.0–36.0)
MCHC: 30.6 g/dL (ref 30.0–36.0)
MCV: 83.2 fL (ref 78.0–100.0)
MCV: 84.7 fL (ref 78.0–100.0)
PLATELETS: 499 10*3/uL — AB (ref 150–400)
PLATELETS: 585 10*3/uL — AB (ref 150–400)
RBC: 3.16 MIL/uL — AB (ref 4.22–5.81)
RBC: 3.2 MIL/uL — ABNORMAL LOW (ref 4.22–5.81)
RDW: 16.2 % — ABNORMAL HIGH (ref 11.5–15.5)
RDW: 16.2 % — AB (ref 11.5–15.5)
WBC: 10 10*3/uL (ref 4.0–10.5)
WBC: 17.6 10*3/uL — AB (ref 4.0–10.5)

## 2015-08-30 LAB — RAPID URINE DRUG SCREEN, HOSP PERFORMED
Amphetamines: NOT DETECTED
BARBITURATES: POSITIVE — AB
BENZODIAZEPINES: NOT DETECTED
COCAINE: NOT DETECTED
Opiates: POSITIVE — AB
Tetrahydrocannabinol: NOT DETECTED

## 2015-08-30 LAB — DIFFERENTIAL
BASOS ABS: 0.1 10*3/uL (ref 0.0–0.1)
BASOS PCT: 0 %
Eosinophils Absolute: 0.3 10*3/uL (ref 0.0–0.7)
Eosinophils Relative: 2 %
Lymphocytes Relative: 28 %
Lymphs Abs: 5 10*3/uL — ABNORMAL HIGH (ref 0.7–4.0)
MONOS PCT: 8 %
Monocytes Absolute: 1.4 10*3/uL — ABNORMAL HIGH (ref 0.1–1.0)
NEUTROS ABS: 10.9 10*3/uL — AB (ref 1.7–7.7)
NEUTROS PCT: 62 %

## 2015-08-30 LAB — GLUCOSE, CAPILLARY
Glucose-Capillary: 123 mg/dL — ABNORMAL HIGH (ref 65–99)
Glucose-Capillary: 159 mg/dL — ABNORMAL HIGH (ref 65–99)
Glucose-Capillary: 163 mg/dL — ABNORMAL HIGH (ref 65–99)

## 2015-08-30 LAB — URINALYSIS, ROUTINE W REFLEX MICROSCOPIC
BILIRUBIN URINE: NEGATIVE
GLUCOSE, UA: NEGATIVE mg/dL
Ketones, ur: NEGATIVE mg/dL
Nitrite: NEGATIVE
pH: 5 (ref 5.0–8.0)

## 2015-08-30 LAB — APTT: APTT: 49 s — AB (ref 24–37)

## 2015-08-30 LAB — URINE MICROSCOPIC-ADD ON

## 2015-08-30 LAB — ETHANOL

## 2015-08-30 LAB — PROTIME-INR
INR: 3.28 — AB (ref 0.00–1.49)
PROTHROMBIN TIME: 32.7 s — AB (ref 11.6–15.2)

## 2015-08-30 LAB — I-STAT TROPONIN, ED: TROPONIN I, POC: 0.26 ng/mL — AB (ref 0.00–0.08)

## 2015-08-30 MED ORDER — IOHEXOL 350 MG/ML SOLN
100.0000 mL | Freq: Once | INTRAVENOUS | Status: AC | PRN
  Administered 2015-08-30: 100 mL via INTRAVENOUS

## 2015-08-30 MED ORDER — HYDROCODONE-ACETAMINOPHEN 7.5-325 MG PO TABS: 1.0000 | ORAL_TABLET | Freq: Four times a day (QID) | ORAL | Status: DC | PRN

## 2015-08-30 MED ORDER — SERTRALINE HCL 25 MG PO TABS: 25.0000 mg | ORAL_TABLET | Freq: Every day | ORAL | Status: AC

## 2015-08-30 MED ORDER — SODIUM CHLORIDE 0.9 % IV SOLN
Freq: Once | INTRAVENOUS | Status: AC
  Administered 2015-08-30: 21:00:00 via INTRAVENOUS

## 2015-08-30 MED ORDER — ONDANSETRON HCL 4 MG/2ML IJ SOLN
INTRAMUSCULAR | Status: AC
  Administered 2015-08-30: 4 mg
  Filled 2015-08-30: qty 2

## 2015-08-30 MED ORDER — RANOLAZINE ER 1000 MG PO TB12: 1000.0000 mg | ORAL_TABLET | Freq: Two times a day (BID) | ORAL | Status: AC

## 2015-08-30 NOTE — Plan of Care (Signed)
Report received.  Assume care of patient.  Donna Christen SN [facility]

## 2015-08-30 NOTE — Plan of Care (Signed)
End of Shift Report given. Donna Christen SN [facility]

## 2015-08-30 NOTE — Progress Notes (Signed)
[facility] PROGRESS NOTE  Henry [name] [id number] DOB: [birth year] DOA: 08/27/2015 PCP: Sherrie Mustache, MD  Assessment/Plan: 1. Chest pain: with heart score of 5. 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 Cardiology recommended medical management at that time due to diffuse disease with very small caliber vessels. Troponin mildly elevated; 2-d echo ordered and demonstrating no wall motion abnormalities and preserved EF. He remains chest pain-free on this mornings evaluation. Continue medical management as recommended by cardiology. Anticipate discharge in the next 24 hours if he remains stable. 2. History of recurrent pulmonary embolism. Patient having a history of PEs as well as positive lupus anticoagulant for which he is on chronic anticoagulation with Xarelto.  3. History of right Achilles tendon rupture. He is receiving care at [facility], currently has an external fixator in place. He has a follow-up visit with his orthopedic surgeon this Thursday. No weight bearing. Continue supportive care and PRN pain meds  4. Type 2 diabetes mellitus. Blood sugars stable on presentation; will provide sliding scale insulin with Accu-Cheks. Continue Januvia 25 mg by mouth daily (or equivalent on formulary). Blood sugars controlled. 5. Hypertension. Blood pressures improved. Continue losartan 100 mg by mouth daily, metoprolol 25 mg by mouth twice a day, verapamil total 40 mg by mouth daily and isosorbide mononitrate 120 mg by mouth daily 6. Dyslipidemia/HLD. Continue statin therapy 7. Chronic obstructive pulmonary disease. Currently stable. Continue home medication regimen. CXR w/o acute cardiopulmonary process.  8. Depression/anxiety:  Started on low dose zoloft. 9. DVT prophylaxis. Patient fully anticoagulated with Xarelto  Code Status: Full Family Communication:  Disposition Plan: Anticipate discharge back to skilled nursing facility next 24 hours once cleared by cardiology.   Consultants:  Cardiology service   Procedures:  2-D echo: no WMA's, preserved EF  Antibiotics:  None   HPI/Subjective: He denies chest pain or shortness of breath. Reports having some nausea this morning  Objective: Filed Vitals:   08/29/15 2008 08/30/15 0540  BP: 133/68 131/64  Pulse: 84 83  Temp: 98.5 F (36.9 C) 98.5 F (36.9 C)  Resp: 18 18    Intake/Output Summary (Last 24 hours) at 08/30/15 0859 Last data filed at 08/30/15 0540  Gross per 24 hour  Intake    360 ml  Output    800 ml  Net   -440 ml   Filed Weights   08/27/15 2120  Weight: 77.157 kg (170 lb 1.6 oz)    Exam:   General:  No acute distress, awake and alert, currently denying chest pain.  Cardiovascular: S1 and s2, no rubs or gallops  Respiratory: good air movement, no wheezing, no crackles   Abdomen: soft, NT, ND, positive BS  Musculoskeletal: trace edema on his LLE, RLE with 1 plus edema and external fixator in place  Data Reviewed: Basic Metabolic Panel:  Recent Labs Lab 08/27/15 1609 08/28/15 0437  NA 137 137  K 4.1 4.5  CL 102 105  CO2 25 23  GLUCOSE 149* 130*  BUN 14 12  CREATININE 1.06 1.01  CALCIUM 8.9 8.7*   CBC:  Recent Labs Lab 08/27/15 1609 08/28/15 0437  WBC 11.4* 9.8  HGB 9.4* 8.6*  HCT 31.1* 27.6*  MCV 84.5 84.4  PLT 485* 501*   Cardiac Enzymes:  Recent Labs Lab  08/27/15 2224 08/28/15 0110 08/28/15 0435  TROPONINI 0.13* 0.14* 0.18*   BNP (last 3 results)  Recent Labs  02/05/15 1735  BNP 120.1*   CBG:  Recent Labs Lab 08/29/15 0744 08/29/15 1126 08/29/15 1659 08/29/15 2008 08/30/15 0721  GLUCAP 143* 184* 139* 135* 123*    Studies: No results found.  Scheduled Meds: .  atorvastatin  80 mg Oral Daily  . budesonide-formoterol  2 puff Inhalation BID  . docusate sodium  100 mg Oral BID  . feeding supplement (ENSURE ENLIVE)  237 mL Oral TID BM  . furosemide  20 mg Oral Daily  . insulin aspart  0-15 Units Subcutaneous TID WC  . insulin aspart  0-5 Units Subcutaneous QHS  . isosorbide mononitrate  120 mg Oral Daily  . lactulose  33.75 g Oral Daily  . linagliptin  5 mg Oral Daily  . losartan  100 mg Oral Daily  . metoprolol  75 mg Oral BID  . montelukast  10 mg Oral QHS  . pantoprazole  40 mg Oral Daily  . polyethylene glycol  17 g Oral Daily  . ranolazine  1,000 mg Oral BID  . rivaroxaban  20 mg Oral Daily  . sertraline  25 mg Oral Daily  . sodium chloride  1 spray Each Nare QID  . tiotropium  18 mcg Inhalation Daily  . verapamil  240 mg Oral Daily  . zolpidem  5 mg Oral QHS    Time spent: 25 minutes   Henry [name]  [facility] Pager [phone]. If 7PM-7AM, please contact night-coverage at [email or web address], password [facility] 08/30/2015, 8:59 AM

## 2015-08-30 NOTE — Progress Notes (Signed)
Subjective:  Feeling better this am. No more chest pain.Received something for nausea and was able to eat more. Appt for foot in [place] tomorrow they don't want to miss. Hoping to go home.  Objective:  Vital Signs in the last 24 hours: Temp:  [98.5 F (36.9 C)-99.4 F (37.4 C)] 98.5 F (36.9 C) (02/15 0540) Pulse Rate:  [83-106] 83 (02/15 0540) Resp:  [16-18] 18 (02/15 0540) BP: (131-184)/(64-90) 131/64 mmHg (02/15 0540) SpO2:  [97 %-99 %] 97 % (02/15 0743)  Intake/Output from previous day: 02/14 0701 - 02/15 0700 In: 360 [P.O.:360] Out: 800 [Urine:800] Intake/Output from this shift:    Physical Exam: NECK: Without JVD, HJR, or bruit LUNGS: Decreased breath sounds but Clear anterior, posterior, lateral HEART: Regular rate and rhythm, no murmur, gallop, rub, bruit, thrill, or heave EXTREMITIES: Right foot with fixator in place Lab Results:  Recent Labs  08/27/15 1609 08/28/15 0437  WBC 11.4* 9.8  HGB 9.4* 8.6*  PLT 485* 501*    Recent Labs  08/27/15 1609 08/28/15 0437  NA 137 137  K 4.1 4.5  CL 102 105  CO2 25 23  GLUCOSE 149* 130*  BUN 14 12  CREATININE 1.06 1.01    Recent Labs  08/28/15 0110 08/28/15 0435  TROPONINI 0.14* 0.18*   Hepatic Function Panel No results for input(s): PROT, ALBUMIN, AST, ALT, ALKPHOS, BILITOT, BILIDIR, IBILI in the last 72 hours. No results for input(s): CHOL in the last 72 hours. No results for input(s): PROTIME in the last 72 hours.    Cardiac Studies: Echo 08/29/15: Study Conclusions  - Left ventricle: The cavity size was normal. Wall thickness was   increased in a pattern of mild LVH. Systolic function was normal.   The estimated ejection fraction was in the range of 60% to 65%.   Wall motion was normal; there were no regional wall motion   abnormalities. Features are consistent with a pseudonormal left   ventricular filling pattern, with concomitant abnormal relaxation   and increased filling pressure (grade 2  diastolic dysfunction).   Doppler parameters are consistent with high ventricular filling   pressure. - Aortic valve: Moderately to severely calcified annulus.   Trileaflet; moderately thickened leaflets. Valve area (VTI): 2.1   cm^2. Valve area (Vmax): 2.07 cm^2. - Mitral valve: Moderately calcified annulus. Mildly thickened   leaflets . There was mild regurgitation. - Left atrium: The atrium was moderately dilated. - Right atrium: The atrium was mildly dilated. - Technically adequate study.   cath 01/2011 LM patent, LAD 70% and occluded more distally, patent LIMA-LAD, severe LCX disease, RCA occluded but fills with collaterals. SVG-OM occluded, SVG-PDA occluded,  Assessment/Plan:     1. Chest pain - history of CAD with prior CABG in 2005, DES to LCX in 2006. Cath 2012 showed graft disease with out revasc targets by PCI of CABG. Managed medically, he is followed very closely by his primary cardiologist Dr Domenic Polite who saw him 08/28/15 for initial consultation as well. His imdur was increased to 120mg  daily and ranexa started. Ranexa increased last night. Seems to have helped.     - medical therapy with atorva 80, imdur 20, losartan 100, lopressor 75 bid, ranexa 1000 bid. No ASA or plavix since on xarelto.   Will arrange close follow up with Dr.McDowell     2. History of PE - history of lupus anticoag Ab - on long term anticoag with xarelto  3. Severe COPD  4. Anemia: Hbg 8.6 on 2/13, will  recheck may be contributing to his chest pain.  5. Ruptured achilles: in fixator       Ermalinda Barrios 08/30/2015, 9:43 AM  Attending Note Patient seen and discussed with PA Bonnell Public, I agree with her documentation. Nonspecific chest pain off and on yesterday, was associated with some nausea and difficulty keeping food down. From cardiac standpoint we titrated up his ranexa. We have continued medical threrapy, prior cath with disease not amenable to revasularization.  Echo shows stable LVEF  and no new wall motion abnormalities. From cardiac standpoint would be ok with discharge with 1-2 week f/u with Dr Domenic Polite our NP/PA. Defer possible anemia workup to primary team.   Zandra Abts MD

## 2015-08-30 NOTE — ED Notes (Signed)
Pt last seen well 1830, noticed symptoms around 1910.  Left sided weekness that has improved on transport

## 2015-08-30 NOTE — ED Notes (Signed)
Dr. Randa Ngo requested to remove BP cuff for exam, unable to take vitals at 2045

## 2015-08-30 NOTE — Discharge Summary (Signed)
Physician Discharge Summary  Henry [name] [id number] DOB: 1941-04-09 DOA: 08/27/2015  PCP: Sherrie Mustache, MD  Admit date: 08/27/2015 Discharge date: 08/30/2015  Time spent: 35 minutes  Recommendations for Outpatient Follow-up:  1. Mr Newby with history of CAD, admitted for chest pain. Cardiology recommending medical management. Ranexa titrated up during this hospitalization.  2. Please check a CBC on hospital follow up visit, he had a Hg of 8.2 on 08/30/2015 3. External fixator to right lower extremity in place, has a follow up appointment with his orthopedic surgeon on 08/31/2015 4. 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)   Discharge Condition: Stable  Diet recommendation: Heart Healthy  Filed Weights   08/27/15 2120  Weight: 77.157 kg (170 lb 1.6 oz)    History of present illness:  Henry [name] is a 75 y.o. male with an established history of coronary artery disease, having multivessel disease status post coronary artery bypass grafting in 2005 (LIMA to LAD, SVG to OM, SVG to PDA), status post drug-eluting stent to circumflex in 2006, last cardiac catheterization performed 01/28/2011 that showed occluded saphenous vein grafts to obtuse marginal and RCA, severe native circumflex disease with cardiology recommending medical management at the time due to diffuse disease with very small caliber vessel. He presents to the emergency department with complaints of chest pain that started this morning. He describes his pain as tight and pressure-like  located in the retrosternal region with radiation to his left shoulder and down his left arm. He denies nausea, vomiting, worsening shortness of breath from baseline, fevers, chills. During my encounter in the emergency department he is chest pain-free. He was given nitroglycerin by EMS with subsequent improvement to his symptoms. Labs showing troponin of 0.04, EKG did not reveal acute ischemic changes.  Hospital Course:   Chest pain: with heart score of 5. 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 Cardiology recommended medical management at that time due to diffuse disease with very small caliber vessels. Troponin mildly elevated; 2-d echo ordered and demonstrating no wall motion abnormalities and preserved EF. He was evaluated by cardiology on 08/30/2015 and felt he was stable for discharge from a cardiac standpoint. His Ranexa was titrated up to 1,000 mg PO BID.  He will follow up with Dr Domenic Polite in 10 days at the cardiology clinic.   History of recurrent pulmonary embolism. Patient having a history of PEs as well as positive lupus anticoagulant for which he is on chronic anticoagulation with Xarelto.   History of right Achilles tendon rupture. He is receiving care at [facility], currently has an external fixator in place. He has a follow-up visit with his orthopedic surgeon this Thursday. No weight bearing. Continue supportive care and PRN pain meds   Type 2 diabetes mellitus. Blood sugars stable on presentation; will provide sliding scale insulin with  Accu-Cheks. Continue Januvia 25 mg by mouth daily.Blood sugars controlled.  Hypertension. Blood pressures improved. Continue losartan 100 mg by mouth daily, metoprolol 25 mg by mouth twice a  day, verapamil total 40 mg by mouth daily and isosorbide mononitrate 120 mg by mouth daily  Dyslipidemia/HLD. Continue statin therapy  Chronic obstructive pulmonary disease. Currently stable. Continue home medication regimen. CXR w/o acute cardiopulmonary process.   Depression/anxiety: Started on low dose zoloft.  Procedures:  2D Echo Impression:    Left ventricle: The cavity size was normal. Wall thickness was increased in a pattern of mild LVH. Systolic function was normal. The estimated ejection fraction was in the range of 60% to 65%. Wall motion was normal; there were no regional wall motion abnormalities. Features are consistent with a pseudonormal left ventricular filling pattern, with concomitant abnormal relaxation and increased filling pressure (grade 2 diastolic dysfunction). Doppler parameters are consistent with high ventricular filling pressure.  Consultations:  Cardiology  Discharge Exam: Filed Vitals:   08/30/15 0540 08/30/15 1027  BP: 131/64 116/81  Pulse: 83 86  Temp: 98.5 F (36.9 C)   Resp: 18      General: No acute distress, awake and alert, currently denying chest pain.  Cardiovascular: S1 and s2, no rubs or gallops  Respiratory: good air movement, no wheezing, no crackles   Abdomen: soft, NT, ND, positive BS  Musculoskeletal: trace edema on his LLE, RLE with 1 plus edema and external fixator in place  Discharge Instructions   Discharge Instructions    Call MD for:  difficulty breathing, headache or visual disturbances    Complete by:  As directed      Call MD for:  extreme fatigue    Complete by:  As directed      Call MD for:  hives    Complete by:  As directed      Call MD for:  persistant dizziness or light-headedness    Complete by:  As directed      Call MD for:  persistant nausea and vomiting    Complete by:  As directed      Call MD for:  redness, tenderness, or signs of infection (pain, swelling, redness,  odor or green/yellow discharge around incision site)    Complete by:  As directed      Call MD for:  severe uncontrolled pain    Complete by:  As directed      Call MD for:  temperature >100.4    Complete by:  As directed      Call MD for:    Complete by:  As directed      Diet - low sodium heart healthy    Complete by:  As directed      Increase activity slowly    Complete by:  As directed           Current Discharge Medication List    START taking these medications   Details  ranolazine (RANEXA) 1000 MG SR tablet Take 1 tablet (1,000 mg total) by mouth 2 (two) times daily. Qty: 60 tablet, Refills: 1    sertraline (ZOLOFT) 25 MG tablet Take 1 tablet (25 mg total) by mouth daily. Qty: 30 tablet, Refills: 1      CONTINUE these medications which have CHANGED   Details  HYDROcodone-acetaminophen (NORCO) 7.5-325 MG tablet Take 1 tablet by mouth every 6 (six) hours as needed for moderate pain. Qty: 15 tablet, Refills: 0      CONTINUE these medications which  have NOT CHANGED   Details  atorvastatin (LIPITOR) 80 MG tablet Take 1 tablet (80 mg total) by mouth daily. Qty: 30 tablet, Refills: 0    budesonide-formoterol (SYMBICORT) 160-4.5 MCG/ACT inhaler Inhale 2 puffs into the lungs 2 (two) times daily.    Coenzyme Q10 (CO Q 10) 100 MG CAPS Take 100 mg by mouth daily.    docusate sodium (COLACE) 100 MG capsule Take 100 mg by mouth 2 (two) times daily.    fish oil-omega-3 fatty acids 1000 MG capsule Take 2 g by mouth daily.     fluticasone (FLONASE) 50 MCG/ACT nasal spray Place 2 sprays into the nose daily.      furosemide (LASIX) 20 MG tablet Take 20 mg by mouth daily.    isosorbide mononitrate (IMDUR) 120 MG 24 hr tablet Take 120 mg by mouth daily.    lactulose (CHRONULAC) 10 GM/15ML solution Take 33.75 g by mouth daily.    losartan (COZAAR) 100 MG tablet Take 1 tablet (100 mg total) by mouth daily. Qty: 30 tablet, Refills: 6    metoprolol (LOPRESSOR) 50 MG tablet TAKE  1 AND 1/2 TABLETS BY MOUTH TWICE DAILY Qty: 90 tablet, Refills: 2    montelukast (SINGULAIR) 10 MG tablet Take 10 mg by mouth at bedtime.    nitroGLYCERIN (NITROSTAT) 0.4 MG SL tablet Place 1 tablet (0.4 mg total) under the tongue every 5 (five) minutes x 3 doses as needed. For chest pain Qty: 25 tablet, Refills: 3    omeprazole (PRILOSEC) 40 MG capsule Take 40 mg by mouth every evening.    polyethylene glycol (MIRALAX / GLYCOLAX) packet Take 17 g by mouth daily.    rivaroxaban (XARELTO) 20 MG TABS tablet Take 20 mg by mouth daily.    sitaGLIPtin (JANUVIA) 25 MG tablet Take 1 tablet (25 mg total) by mouth daily. Qty: 30 tablet, Refills: 0    sodium chloride (OCEAN) 0.65 % SOLN nasal spray Place 1 spray into both nostrils 4 (four) times daily.    tiotropium (SPIRIVA) 18 MCG inhalation capsule Place 1 capsule (18 mcg total) into inhaler and inhale daily. Qty: 30 capsule, Refills: 5    verapamil (CALAN-SR) 240 MG CR tablet TAKE 1 TABLET (240 MG TOTAL) BY MOUTH DAILY. Qty: 30 tablet, Refills: 6    zolpidem (AMBIEN) 10 MG tablet Take 10 mg by mouth at bedtime. Refills: 2    acetaminophen (TYLENOL) 325 MG tablet Take 650 mg by mouth every 4 (four) hours as needed for mild pain. For pain    albuterol (PROAIR HFA) 108 (90 BASE) MCG/ACT inhaler Inhale 2 puffs into the lungs every 6 (six) hours as needed for wheezing or shortness of breath.     chlorthalidone (HYGROTON) 25 MG tablet TAKE 1 TABLET BY MOUTH DAILY Qty: 30 tablet, Refills: 6      STOP taking these medications     potassium chloride (K-DUR) 10 MEQ tablet      amoxicillin (AMOXIL) 500 MG capsule      HYDROcodone-acetaminophen (NORCO/VICODIN) 5-325 MG per tablet      HYDROcodone-homatropine (HYCODAN) 5-1.5 MG/5ML syrup      predniSONE (DELTASONE) 10 MG tablet        Allergies  Allergen Reactions  . Ace Inhibitors Cough    Cough   Follow-up Information    Follow up with Sherrie Mustache, MD In 2 weeks.    Specialty:  Family Medicine   Contact information:   [address] [place] [address] [phone]  Follow up with Rozann Lesches, MD In 10 days.   Specialty:  Cardiology   Contact information:   [place] [address] [phone]        The results of significant diagnostics from this hospitalization (including imaging, microbiology, ancillary and laboratory) are listed below for reference.    Significant Diagnostic Studies: Dg Chest Portable 1 View  08/27/2015  CLINICAL DATA:  Chest pain EXAM: PORTABLE CHEST 1 VIEW COMPARISON:  04/03/2015 FINDINGS: Cardiac shadow is within normal limits. Postsurgical changes are noted. Aortic calcifications are again seen. Lungs are well-aerated without focal infiltrate or sizable effusion. No acute bony abnormality is seen. IMPRESSION: No active disease. Electronically Signed   By: Inez Catalina M.D.   On: 08/27/2015 17:03    Microbiology: No results found for this or any previous visit (from the past 240 hour(s)).   Labs: Basic Metabolic Panel:  Recent Labs Lab 08/27/15 1609 08/28/15 0437  NA 137 137  K 4.1 4.5  CL 102 105  CO2 25 23  GLUCOSE 149* 130*  BUN 14 12  CREATININE 1.06 1.01  CALCIUM 8.9 8.7*   Liver Function Tests: No results for input(s): AST, ALT, ALKPHOS, BILITOT, PROT, ALBUMIN in the last 168 hours. No results for input(s): LIPASE, AMYLASE in the last 168 hours. No results for input(s): AMMONIA in the last 168 hours. CBC:  Recent Labs Lab 08/27/15 1609 08/28/15 0437 08/30/15 1032  WBC 11.4* 9.8 10.0  HGB 9.4* 8.6* 8.2*  HCT 31.1* 27.6* 26.3*  MCV 84.5 84.4 83.2  PLT 485* 501* 499*   Cardiac Enzymes:  Recent Labs Lab 08/27/15 2224 08/28/15 0110 08/28/15 0435  TROPONINI 0.13* 0.14* 0.18*   BNP: BNP (last 3 results)  Recent Labs  02/05/15 1735  BNP 120.1*    ProBNP (last 3 results) No results for input(s): PROBNP in the last 8760 hours.  CBG:  Recent  Labs Lab 08/29/15 1126 08/29/15 1659 08/29/15 2008 08/30/15 0721 08/30/15 1151  GLUCAP 184* 139* 135* 123* 159*       Signed:  Kelvin Cellar MD.  [facility] 08/30/2015, 12:06 PM

## 2015-08-30 NOTE — ED Notes (Signed)
Per Dr Randa Ngo, Pt not eligible for TPA due to Xarelto

## 2015-08-30 NOTE — Progress Notes (Signed)
Beeper [phone] (pt coming via EMS but no in building at time of page) Phone Call are we ready  20.10 Pt in ct 2015 Completed 2019 GR 2020

## 2015-08-30 NOTE — Progress Notes (Signed)
Patient discharged back to [facility] called,and given to [name] LPN. Vital signs stable. Transportation was provided by [employer].

## 2015-08-30 NOTE — Clinical Social Work Note (Signed)
Clinical Social Work Assessment  Patient Details  Name: Henry [name] MRN: [id number] Date of Birth: [birth year]  Date of referral:  08/29/15               Reason for consult:  Facility Placement                Permission sought to share information with:  Family Supports Permission granted to share information::     Name::        Agency::     Relationship::     Contact Information:     Housing/Transportation Living arrangements for the past 2 months:  Mobile of Information:  Patient, Spouse Patient Interpreter Needed:  None Criminal Activity/Legal Involvement Pertinent to Current Situation/Hospitalization:  No - Comment as needed Significant Relationships:  Adult Children, Spouse, Community Support Lives with:  Facility Resident Do you feel safe going back to the place where you live?  Yes Need for family participation in patient care:  Yes (Comment)  Care giving concerns:  None identified.   Social Worker assessment / plan:  CSW spoke with Engineer, maintenance (IT) at [facility]. She advised that patient has been a resident at the facility on 12/8 and again on 2/13.  She stated that patient is a one person assist, he feeds himself, is a fall risk, uses a wheelchair and his wife is very involved.  She stated that patient can return to complete rehab at discharge. CSW advised that patient was being discharged. Claiborne Billings advised that the facility would pick patient up at discharge. Patient and spouse confirmed Kelly's statements.  CSW advised of patient's discharge and that the facility would be picking patient up.   CSW sent discharge clinicals via Epic hub. CSW signing off.     Employment status:  Retired Nurse, adult PT Recommendations:  [place] / Referral to community resources:  [place]  Patient/Family's Response to care:  Patient and family are agreeable for patient to return to facility to  complete rehab.  Patient/Family's Understanding of and Emotional Response to Diagnosis, Current Treatment, and Prognosis:  Family and patient understand patient's diagnosis, treatment and prognosis.    Emotional Assessment Appearance:  Developmentally appropriate Attitude/Demeanor/Rapport:    Affect (typically observed):  Accepting, Appropriate Orientation:  Oriented to Self, Oriented to Place, Oriented to  Time, Oriented to Situation Alcohol / Substance use:  Not Applicable Psych involvement (Current and /or in the community):  No (Comment)  Discharge Needs  Concerns to be addressed:  Discharge Planning Concerns Readmission within the last 30 days:  No Current discharge risk:  None Barriers to Discharge:  No Barriers Identified   Ihor Gully, LCSW 08/30/2015, 4:23 PM [phone]

## 2015-08-30 NOTE — ED Provider Notes (Signed)
CSN: [id number]     Arrival date & time 08/30/15  2006 History   First MD Initiated Contact with Patient 08/30/15 2010     Chief Complaint  Patient presents with  . Code Stroke    An emergency department physician performed an initial assessment on this suspected stroke patient at 2010. (Consider location/radiation/quality/duration/timing/severity/associated sxs/prior Treatment) Patient is a 75 y.o. male presenting with weakness. The history is provided by the patient (The patient started with weakness in his left arm unable to grip and left facial drooping on 6:30 by time he got to the emergency department most of this and resolved).  Weakness This is a new problem. The current episode started 1 to 2 hours ago. The problem occurs rarely. The problem has been resolved. Pertinent negatives include no chest pain, no abdominal pain and no headaches. Nothing aggravates the symptoms. Nothing relieves the symptoms.    Past Medical History  Diagnosis Date  . Coronary atherosclerosis of native coronary artery     a. NSTEMI 2005 s/p CABG (LIMA - LAD, SVG - OM, SVG - PDA). b. DES to Cx 02/2005. c. NSTEMI 07/2010 due to occ Cx - med rx. Known occluded vein grafts.  . Essential hypertension, benign   . Type 2 diabetes mellitus ([place])   . Dyslipidemia   . ACE inhibitor intolerance   . Asthma   . Recurrent pulmonary embolism ([place])     a. 01/2011. b. Bilateral PE 11/2011 - + lupus anticoagulant preliminary testing. Felt to require lifelong Coumadin.  . Pulmonary nodule     RML by CT 11/2011, stable compared to 2012.  Marland Kitchen Cholelithiasis   . Pulmonary HTN ([place])     Echo 11/2011 at time of PE. PA pressure not assessed on 03/2012 echo but RV was back to normal.  . Lupus anticoagulant positive   . GERD (gastroesophageal reflux disease)   . COPD (chronic obstructive pulmonary disease) ([place])   . OSA (obstructive sleep apnea)    Past Surgical History  Procedure Laterality Date  . Coronary artery bypass graft   2005     LIMA to LAD, SVG to OM, SVG to RCA  . L4-l5 laminectomy    . Colonoscopy    . Esophagogastroduodenoscopy N/A 12/19/2013    Procedure: ESOPHAGOGASTRODUODENOSCOPY (EGD);  Surgeon: Lafayette Dragon, MD;  Location: [facility] ENDOSCOPY;  Service: Endoscopy;  Laterality: N/A;  . Kidney stone removed     Family History  Problem Relation Age of Onset  . Heart disease Father   . Heart disease Sister   . Breast cancer Sister   . Heart disease Brother    Social History  Substance Use Topics  . Smoking status: Former Smoker -- 0.30 packs/day for 30 years    Types: Cigarettes    Start date: 09/28/1945    Quit date: 07/15/1972  . Smokeless tobacco: Current User    Types: Chew     Comment: chews 1/2 pack tobacco per day  . Alcohol Use: No    Review of Systems  Constitutional: Negative for appetite change and fatigue.  HENT: Negative for congestion, ear discharge and sinus pressure.   Eyes: Negative for discharge.  Respiratory: Negative for cough.   Cardiovascular: Negative for chest pain.  Gastrointestinal: Negative for abdominal pain and diarrhea.  Genitourinary: Negative for frequency and hematuria.  Musculoskeletal: Negative for back pain.  Skin: Negative for rash.  Neurological: Positive for weakness. Negative for seizures and headaches.       Left facial weakness and  left arm weakness  Psychiatric/Behavioral: Negative for hallucinations.      Allergies  Ace inhibitors and Gabapentin  Home Medications   Prior to Admission medications   Medication Sig Start Date End Date Taking? Authorizing Provider  acetaminophen (TYLENOL) 325 MG tablet Take 650 mg by mouth every 4 (four) hours as needed for mild pain. For pain   Yes Historical Provider, MD  albuterol (PROAIR HFA) 108 (90 BASE) MCG/ACT inhaler Inhale 2 puffs into the lungs every 6 (six) hours as needed for wheezing or shortness of breath.  02/11/14 02/11/16 Yes Historical Provider, MD  atorvastatin (LIPITOR) 80 MG tablet Take 1  tablet (80 mg total) by mouth daily. 02/09/15  Yes Geradine Girt, DO  budesonide-formoterol (SYMBICORT) 160-4.5 MCG/ACT inhaler Inhale 2 puffs into the lungs 2 (two) times daily.   Yes Historical Provider, MD  Coenzyme Q10 (CO Q 10) 100 MG CAPS Take 100 mg by mouth daily.   Yes Historical Provider, MD  docusate sodium (COLACE) 100 MG capsule Take 100 mg by mouth 2 (two) times daily.   Yes Historical Provider, MD  fish oil-omega-3 fatty acids 1000 MG capsule Take 2 g by mouth every morning.    Yes Historical Provider, MD  fluticasone (FLONASE) 50 MCG/ACT nasal spray Place 1 spray into both nostrils daily.    Yes Historical Provider, MD  furosemide (LASIX) 20 MG tablet Take 20 mg by mouth daily.   Yes Historical Provider, MD  HYDROcodone-acetaminophen (NORCO) 7.5-325 MG tablet Take 1 tablet by mouth every 6 (six) hours as needed for moderate pain. Patient taking differently: Take 1 tablet by mouth 2 (two) times daily. *May take every 4 hours as needed for pain 08/30/15  Yes Kelvin Cellar, MD  isosorbide mononitrate (IMDUR) 120 MG 24 hr tablet Take 120 mg by mouth daily.   Yes Historical Provider, MD  lactulose (CHRONULAC) 10 GM/15ML solution Take 15 g by mouth daily.    Yes Historical Provider, MD  losartan (COZAAR) 100 MG tablet Take 1 tablet (100 mg total) by mouth daily. 01/18/15  Yes Satira Sark, MD  metoprolol (LOPRESSOR) 50 MG tablet TAKE 1 AND 1/2 TABLETS BY MOUTH TWICE DAILY 05/22/15  Yes Satira Sark, MD  montelukast (SINGULAIR) 10 MG tablet Take 10 mg by mouth at bedtime.   Yes Historical Provider, MD  nitroGLYCERIN (NITROSTAT) 0.4 MG SL tablet Place 1 tablet (0.4 mg total) under the tongue every 5 (five) minutes x 3 doses as needed. For chest pain 04/05/14  Yes Satira Sark, MD  omeprazole (PRILOSEC) 40 MG capsule Take 40 mg by mouth every evening.   Yes Historical Provider, MD  polyethylene glycol (MIRALAX / GLYCOLAX) packet Take 17 g by mouth daily.   Yes Historical Provider,  MD  potassium chloride (K-DUR) 10 MEQ tablet Take 10 mEq by mouth every morning.   Yes Historical Provider, MD  rivaroxaban (XARELTO) 20 MG TABS tablet Take 20 mg by mouth daily.   Yes Historical Provider, MD  sitaGLIPtin (JANUVIA) 25 MG tablet Take 1 tablet (25 mg total) by mouth daily. 02/09/15  Yes Geradine Girt, DO  sodium chloride (OCEAN) 0.65 % SOLN nasal spray Place 1 spray into both nostrils 4 (four) times daily.   Yes Historical Provider, MD  tiotropium (SPIRIVA) 18 MCG inhalation capsule Place 1 capsule (18 mcg total) into inhaler and inhale daily. 11/16/14  Yes Juanito Doom, MD  verapamil (CALAN-SR) 240 MG CR tablet TAKE 1 TABLET (240 MG TOTAL) BY MOUTH  DAILY. 05/20/14  Yes Satira Sark, MD  zolpidem (AMBIEN) 10 MG tablet Take 10 mg by mouth at bedtime. 01/30/15  Yes Historical Provider, MD  chlorthalidone (HYGROTON) 25 MG tablet TAKE 1 TABLET BY MOUTH DAILY Patient not taking: Reported on 08/30/2015 12/09/14   Satira Sark, MD  ranolazine (RANEXA) 1000 MG SR tablet Take 1 tablet (1,000 mg total) by mouth 2 (two) times daily. Patient not taking: Reported on 08/30/2015 08/30/15   Kelvin Cellar, MD  sertraline (ZOLOFT) 25 MG tablet Take 1 tablet (25 mg total) by mouth daily. Patient not taking: Reported on 08/30/2015 08/30/15   Kelvin Cellar, MD   BP 115/71 mmHg  Pulse 79  Resp 14  SpO2 96% Physical Exam  Constitutional: He is oriented to person, place, and time. He appears well-developed.  HENT:  Head: Normocephalic.  Eyes: Conjunctivae and EOM are normal. No scleral icterus.  Neck: Neck supple. No thyromegaly present.  Cardiovascular: Normal rate and regular rhythm.  Exam reveals no gallop and no friction rub.   No murmur heard. Pulmonary/Chest: No stridor. He has no wheezes. He has no rales. He exhibits no tenderness.  Abdominal: He exhibits no distension. There is no tenderness. There is no rebound.  Musculoskeletal: Normal range of motion. He exhibits no edema.   Lymphadenopathy:    He has no cervical adenopathy.  Neurological: He is oriented to person, place, and time. He exhibits normal muscle tone. Coordination normal.  Skin: No rash noted. No erythema.  Psychiatric: He has a normal mood and affect. His behavior is normal.    ED Course  Procedures (including critical care time) Labs Review Labs Reviewed  PROTIME-INR - Abnormal; Notable for the following:    Prothrombin Time 32.7 (*)    INR 3.28 (*)    All other components within normal limits  APTT - Abnormal; Notable for the following:    aPTT 49 (*)    All other components within normal limits  CBC - Abnormal; Notable for the following:    WBC 17.6 (*)    RBC 3.20 (*)    Hemoglobin 8.3 (*)    HCT 27.1 (*)    MCH 25.9 (*)    RDW 16.2 (*)    Platelets 585 (*)    All other components within normal limits  DIFFERENTIAL - Abnormal; Notable for the following:    Neutro Abs 10.9 (*)    Lymphs Abs 5.0 (*)    Monocytes Absolute 1.4 (*)    All other components within normal limits  COMPREHENSIVE METABOLIC PANEL - Abnormal; Notable for the following:    Sodium 133 (*)    Chloride 98 (*)    Glucose, Bld 177 (*)    BUN 22 (*)    Creatinine, Ser 1.65 (*)    Calcium 8.5 (*)    Albumin 2.5 (*)    ALT 15 (*)    GFR calc non Af Amer 39 (*)    GFR calc Af Amer 45 (*)    All other components within normal limits  I-STAT TROPOININ, ED - Abnormal; Notable for the following:    Troponin i, poc 0.26 (*)    All other components within normal limits  CBG MONITORING, ED - Abnormal; Notable for the following:    Glucose-Capillary 147 (*)    All other components within normal limits  ETHANOL  URINE RAPID DRUG SCREEN, HOSP PERFORMED  URINALYSIS, ROUTINE W REFLEX MICROSCOPIC (NOT AT [facility])    Imaging Review Ct Angio Head  W/cm &/or Wo Cm  08/30/2015  CLINICAL DATA:  LEFT-sided weakness today.  Code stroke. EXAM: CT ANGIOGRAPHY HEAD AND NECK TECHNIQUE: Multidetector CT imaging of the head and  neck was performed using the standard protocol during bolus administration of intravenous contrast. Multiplanar CT image reconstructions and MIPs were obtained to evaluate the vascular anatomy. Carotid stenosis measurements (when applicable) are obtained utilizing NASCET criteria, using the distal internal carotid diameter as the denominator. CONTRAST:  13mL OMNIPAQUE IOHEXOL 350 MG/ML SOLN COMPARISON:  CT head August 30, 2015 at 2042 hours. FINDINGS: CTA NECK Aortic arch: Normal appearance of the thoracic arch, normal branch pattern. Mild calcific atherosclerosis. The origins of the innominate, left Common carotid artery and subclavian artery are widely patent. Right carotid system: Common carotid artery is patent, coursing in a straight line fashion. Eccentric intimal thickening and calcific atherosclerosis results in very mild stenosis mid RIGHT Common carotid artery. Eccentric calcific atherosclerosis results in 65-70% stenosis RIGHT internal carotid artery, 17 mm segment. Mild poststenotic dilatation. Normal appearance of the included internal carotid artery. Left carotid system: Common carotid artery is widely patent, coursing in a straight line fashion. Eccentric calcific atherosclerosis results in less than 50% stenosis. Normal appearance of the mid to distal cervical internal carotid artery. Vertebral arteries:Calcific atherosclerosis results in severe stenosis bilateral vertebral artery origins, with mild poststenotic dilatation distal LEFT V4 segment. LEFT vertebral artery is dominant. Extrinsic calcific atherosclerosis results in moderate tandem stenosis LEFT V4 segment. Skeleton: No acute osseous process though bone windows have not been submitted. Poor dentition with multiple dental caries, and absent teeth. C5-6 intradiscal calcifications, and findings of auto interbody arthrodesis. Severe C6-7 degenerative disc. Severe LEFT upper cervical facet arthropathy. Status post median sternotomy. Other  neck: Soft tissues of the neck are nonacute though, not tailored for evaluation. CTA HEAD Anterior circulation: Patent cervical internal carotid arteries, petrous, cavernous and supra clinoid internal carotid arteries. Calcific atherosclerosis of the carotid siphons results in at least moderate stenosis of bilateral supraclinoid internal carotid arteries. Widely patent anterior communicating artery. Patent anterior and middle cerebral arteries. Mild stenosis proximal RIGHT A1 segment. Posterior circulation: RIGHT vertebral artery terminates in the posterior inferior cerebellar artery. Heavy calcification of distal LEFT V4 segment resulting at least moderate long segment of stenosis to proximal basilar artery which is patent. Bilateral small posterior communicating arteries are present. High-grade stenosis RIGHT P1 segment. Bilateral posterior cerebral arteries are patent. Mild tandem stenosis LEFT posterior cerebral artery compatible with atherosclerosis. No large vessel occlusion, dissection, contrast extravasation or aneurysm within the anterior nor posterior circulation. No abnormal intracranial enhancement. IMPRESSION: CTA NECK: Atherosclerosis resulting in 65-70% stenosis RIGHT internal carotid artery origin. Less than 50% stenosis LEFT internal carotid artery origin. High-grade stenosis bilateral vertebral artery origins, which are patent. CTA HEAD:  No acute large vessel occlusion. Moderate stenosis of bilateral supraclinoid internal carotid artery due to calcific atherosclerosis. Moderate stenosis LEFT V4 segment. High-grade stenosis RIGHT P1 segment, complete circle of Willis. Electronically Signed   By: Elon Alas M.D.   On: 08/30/2015 22:15   Ct Head Wo Contrast  08/30/2015  CLINICAL DATA:  Code stroke.  Left facial droop. EXAM: CT HEAD WITHOUT CONTRAST TECHNIQUE: Contiguous axial images were obtained from the base of the skull through the vertex without intravenous contrast. COMPARISON:   04/03/2015. FINDINGS: There is no evidence for acute hemorrhage, hydrocephalus, mass lesion, or abnormal extra-axial fluid collection. No definite CT evidence for acute infarction. Diffuse loss of parenchymal volume is consistent with atrophy. Patchy  low attenuation in the deep hemispheric and periventricular white matter is nonspecific, but likely reflects chronic microvascular ischemic demyelination. The visualized paranasal sinuses and mastoid air cells are clear. IMPRESSION: Stable.  No acute intracranial abnormality. Atrophy with chronic small vessel white matter ischemic disease. Critical Value/emergent results were called by me at the time of interpretation on 08/30/2015 at 8:23 pm to Dr. Milton Ferguson , who verbally acknowledged these results. Electronically Signed   By: Misty Stanley M.D.   On: 08/30/2015 20:23   Ct Angio Neck W/cm &/or Wo/cm  08/30/2015  CLINICAL DATA:  LEFT-sided weakness today.  Code stroke. EXAM: CT ANGIOGRAPHY HEAD AND NECK TECHNIQUE: Multidetector CT imaging of the head and neck was performed using the standard protocol during bolus administration of intravenous contrast. Multiplanar CT image reconstructions and MIPs were obtained to evaluate the vascular anatomy. Carotid stenosis measurements (when applicable) are obtained utilizing NASCET criteria, using the distal internal carotid diameter as the denominator. CONTRAST:  142mL OMNIPAQUE IOHEXOL 350 MG/ML SOLN COMPARISON:  CT head August 30, 2015 at 2042 hours. FINDINGS: CTA NECK Aortic arch: Normal appearance of the thoracic arch, normal branch pattern. Mild calcific atherosclerosis. The origins of the innominate, left Common carotid artery and subclavian artery are widely patent. Right carotid system: Common carotid artery is patent, coursing in a straight line fashion. Eccentric intimal thickening and calcific atherosclerosis results in very mild stenosis mid RIGHT Common carotid artery. Eccentric calcific atherosclerosis  results in 65-70% stenosis RIGHT internal carotid artery, 17 mm segment. Mild poststenotic dilatation. Normal appearance of the included internal carotid artery. Left carotid system: Common carotid artery is widely patent, coursing in a straight line fashion. Eccentric calcific atherosclerosis results in less than 50% stenosis. Normal appearance of the mid to distal cervical internal carotid artery. Vertebral arteries:Calcific atherosclerosis results in severe stenosis bilateral vertebral artery origins, with mild poststenotic dilatation distal LEFT V4 segment. LEFT vertebral artery is dominant. Extrinsic calcific atherosclerosis results in moderate tandem stenosis LEFT V4 segment. Skeleton: No acute osseous process though bone windows have not been submitted. Poor dentition with multiple dental caries, and absent teeth. C5-6 intradiscal calcifications, and findings of auto interbody arthrodesis. Severe C6-7 degenerative disc. Severe LEFT upper cervical facet arthropathy. Status post median sternotomy. Other neck: Soft tissues of the neck are nonacute though, not tailored for evaluation. CTA HEAD Anterior circulation: Patent cervical internal carotid arteries, petrous, cavernous and supra clinoid internal carotid arteries. Calcific atherosclerosis of the carotid siphons results in at least moderate stenosis of bilateral supraclinoid internal carotid arteries. Widely patent anterior communicating artery. Patent anterior and middle cerebral arteries. Mild stenosis proximal RIGHT A1 segment. Posterior circulation: RIGHT vertebral artery terminates in the posterior inferior cerebellar artery. Heavy calcification of distal LEFT V4 segment resulting at least moderate long segment of stenosis to proximal basilar artery which is patent. Bilateral small posterior communicating arteries are present. High-grade stenosis RIGHT P1 segment. Bilateral posterior cerebral arteries are patent. Mild tandem stenosis LEFT posterior  cerebral artery compatible with atherosclerosis. No large vessel occlusion, dissection, contrast extravasation or aneurysm within the anterior nor posterior circulation. No abnormal intracranial enhancement. IMPRESSION: CTA NECK: Atherosclerosis resulting in 65-70% stenosis RIGHT internal carotid artery origin. Less than 50% stenosis LEFT internal carotid artery origin. High-grade stenosis bilateral vertebral artery origins, which are patent. CTA HEAD:  No acute large vessel occlusion. Moderate stenosis of bilateral supraclinoid internal carotid artery due to calcific atherosclerosis. Moderate stenosis LEFT V4 segment. High-grade stenosis RIGHT P1 segment, complete circle of Willis. Electronically Signed  By: Elon Alas M.D.   On: 08/30/2015 22:15   I have personally reviewed and evaluated these images and lab results as part of my medical decision-making.   EKG Interpretation   Date/Time:  Wednesday August 30 2015 20:23:02 EST Ventricular Rate:  76 PR Interval:    QRS Duration: 95 QT Interval:  394 QTC Calculation: 443 R Axis:   77 Text Interpretation:  Accelerated junctional rhythm Probable inferior  infarct, age indeterminate Consider anterior infarct Lateral leads are  also involved Confirmed by Lysha Schrade  MD, Broadus John [phone]) on 08/30/2015  10:07:09 PM     The patient just left the hospital today for he was evaluated for chest pain. He has coronary disease and was decided by cardiology to treat him medically. When he got to the nursing home is when he had the weakness in his arm. Patient has been seen by telemetry neurology who recommended a CTA of the head and neck and not to use thrombolytics.  Dr. Leonel Ramsay was counseled on the phone after CTA with back and he felt like patient could be admitted and evaluated at [facility]. MDM   Final diagnoses:  Acute ischemic stroke [facility])    Diagnosis stroke and elevated troponin    Milton Ferguson, MD 08/30/15 [id number]

## 2015-08-31 ENCOUNTER — Inpatient Hospital Stay (HOSPITAL_COMMUNITY): Payer: Medicare Other

## 2015-08-31 ENCOUNTER — Encounter (HOSPITAL_COMMUNITY): Payer: Self-pay | Admitting: Family Medicine

## 2015-08-31 DIAGNOSIS — I2510 Atherosclerotic heart disease of native coronary artery without angina pectoris: Secondary | ICD-10-CM | POA: Insufficient documentation

## 2015-08-31 DIAGNOSIS — J449 Chronic obstructive pulmonary disease, unspecified: Secondary | ICD-10-CM

## 2015-08-31 DIAGNOSIS — D631 Anemia in chronic kidney disease: Secondary | ICD-10-CM

## 2015-08-31 DIAGNOSIS — I639 Cerebral infarction, unspecified: Secondary | ICD-10-CM | POA: Diagnosis present

## 2015-08-31 DIAGNOSIS — R079 Chest pain, unspecified: Secondary | ICD-10-CM

## 2015-08-31 DIAGNOSIS — R7989 Other specified abnormal findings of blood chemistry: Secondary | ICD-10-CM

## 2015-08-31 DIAGNOSIS — D75839 Thrombocytosis, unspecified: Secondary | ICD-10-CM | POA: Diagnosis present

## 2015-08-31 DIAGNOSIS — E1100 Type 2 diabetes mellitus with hyperosmolarity without nonketotic hyperglycemic-hyperosmolar coma (NKHHC): Secondary | ICD-10-CM

## 2015-08-31 DIAGNOSIS — D473 Essential (hemorrhagic) thrombocythemia: Secondary | ICD-10-CM

## 2015-08-31 DIAGNOSIS — E871 Hypo-osmolality and hyponatremia: Secondary | ICD-10-CM | POA: Diagnosis present

## 2015-08-31 DIAGNOSIS — N189 Chronic kidney disease, unspecified: Secondary | ICD-10-CM

## 2015-08-31 DIAGNOSIS — N182 Chronic kidney disease, stage 2 (mild): Secondary | ICD-10-CM

## 2015-08-31 DIAGNOSIS — I5032 Chronic diastolic (congestive) heart failure: Secondary | ICD-10-CM | POA: Diagnosis present

## 2015-08-31 LAB — LIPID PANEL
CHOL/HDL RATIO: 3 ratio
Cholesterol: 93 mg/dL (ref 0–200)
HDL: 31 mg/dL — AB (ref >40)
LDL CALC: 45 mg/dL (ref 0–99)
TRIGLYCERIDES: 87 mg/dL (ref <150)
VLDL: 17 mg/dL (ref 0–40)

## 2015-08-31 LAB — BASIC METABOLIC PANEL
ANION GAP: 6 (ref 5–15)
BUN: 24 mg/dL — AB (ref 6–20)
CHLORIDE: 103 mmol/L (ref 101–111)
CO2: 26 mmol/L (ref 22–32)
Calcium: 8.6 mg/dL — ABNORMAL LOW (ref 8.9–10.3)
Creatinine, Ser: 1.32 mg/dL — ABNORMAL HIGH (ref 0.61–1.24)
GFR calc Af Amer: 59 mL/min — ABNORMAL LOW (ref >60)
GFR, EST NON AFRICAN AMERICAN: 51 mL/min — AB (ref >60)
GLUCOSE: 131 mg/dL — AB (ref 65–99)
POTASSIUM: 4.6 mmol/L (ref 3.5–5.1)
SODIUM: 135 mmol/L (ref 135–145)

## 2015-08-31 LAB — GLUCOSE, CAPILLARY
GLUCOSE-CAPILLARY: 124 mg/dL — AB (ref 65–99)
GLUCOSE-CAPILLARY: 118 mg/dL — AB (ref 65–99)
GLUCOSE-CAPILLARY: 109 mg/dL — AB (ref 65–99)
Glucose-Capillary: 142 mg/dL — ABNORMAL HIGH (ref 65–99)
Glucose-Capillary: 153 mg/dL — ABNORMAL HIGH (ref 65–99)
Glucose-Capillary: 180 mg/dL — ABNORMAL HIGH (ref 65–99)

## 2015-08-31 LAB — CBC
HCT: 24.6 % — ABNORMAL LOW (ref 39.0–52.0)
HEMOGLOBIN: 7.7 g/dL — AB (ref 13.0–17.0)
MCH: 26 pg (ref 26.0–34.0)
MCHC: 31.3 g/dL (ref 30.0–36.0)
MCV: 83.1 fL (ref 78.0–100.0)
PLATELETS: 488 10*3/uL — AB (ref 150–400)
RBC: 2.96 MIL/uL — AB (ref 4.22–5.81)
RDW: 16.3 % — ABNORMAL HIGH (ref 11.5–15.5)
WBC: 13 10*3/uL — AB (ref 4.0–10.5)

## 2015-08-31 LAB — PROCALCITONIN

## 2015-08-31 LAB — LACTIC ACID, PLASMA
Lactic Acid, Venous: 4.3 mmol/L (ref 0.5–2.0)
Lactic Acid, Venous: 1.1 mmol/L (ref 0.5–2.0)

## 2015-08-31 LAB — TROPONIN I
Troponin I: 0.18 ng/mL — ABNORMAL HIGH (ref <0.031)
Troponin I: 0.48 ng/mL — ABNORMAL HIGH (ref <0.031)
Troponin I: 0.83 ng/mL (ref <0.031)

## 2015-08-31 MED ORDER — RIVAROXABAN 20 MG PO TABS
20.0000 mg | ORAL_TABLET | Freq: Every day | ORAL | Status: DC
  Administered 2015-08-31 – 2015-09-01 (×2): 20 mg via ORAL
  Filled 2015-08-31 (×2): qty 1

## 2015-08-31 MED ORDER — ISOSORBIDE MONONITRATE ER 60 MG PO TB24
120.0000 mg | ORAL_TABLET | Freq: Every day | ORAL | Status: DC
  Administered 2015-08-31 – 2015-09-06 (×7): 120 mg via ORAL
  Filled 2015-08-31 (×7): qty 2

## 2015-08-31 MED ORDER — HYDROCODONE-ACETAMINOPHEN 7.5-325 MG PO TABS
1.0000 | ORAL_TABLET | ORAL | Status: DC | PRN
  Administered 2015-08-31 – 2015-09-02 (×9): 1 via ORAL
  Administered 2015-09-02: 2 via ORAL
  Administered 2015-09-02 – 2015-09-04 (×7): 1 via ORAL
  Administered 2015-09-04: 2 via ORAL
  Administered 2015-09-04 (×2): 1 via ORAL
  Administered 2015-09-05 (×2): 2 via ORAL
  Administered 2015-09-05 – 2015-09-06 (×2): 1 via ORAL
  Administered 2015-09-06 (×2): 2 via ORAL
  Administered 2015-09-06: 1 via ORAL
  Administered 2015-09-06: 2 via ORAL
  Filled 2015-08-31: qty 1
  Filled 2015-08-31: qty 2
  Filled 2015-08-31: qty 1
  Filled 2015-08-31: qty 2
  Filled 2015-08-31: qty 1
  Filled 2015-08-31: qty 2
  Filled 2015-08-31 (×7): qty 1
  Filled 2015-08-31 (×2): qty 2
  Filled 2015-08-31 (×4): qty 1
  Filled 2015-08-31: qty 2
  Filled 2015-08-31 (×2): qty 1
  Filled 2015-08-31 (×2): qty 2
  Filled 2015-08-31: qty 1
  Filled 2015-08-31: qty 2
  Filled 2015-08-31: qty 1
  Filled 2015-08-31: qty 2

## 2015-08-31 MED ORDER — ASPIRIN 81 MG PO CHEW
324.0000 mg | CHEWABLE_TABLET | Freq: Once | ORAL | Status: AC
  Administered 2015-08-31: 324 mg via ORAL
  Filled 2015-08-31: qty 4

## 2015-08-31 MED ORDER — NITROGLYCERIN 0.4 MG SL SUBL
0.4000 mg | SUBLINGUAL_TABLET | SUBLINGUAL | Status: DC | PRN
  Administered 2015-09-01 (×2): 0.4 mg via SUBLINGUAL
  Filled 2015-08-31: qty 1

## 2015-08-31 MED ORDER — BUDESONIDE-FORMOTEROL FUMARATE 160-4.5 MCG/ACT IN AERO
2.0000 | INHALATION_SPRAY | Freq: Two times a day (BID) | RESPIRATORY_TRACT | Status: DC
  Administered 2015-08-31 – 2015-09-06 (×10): 2 via RESPIRATORY_TRACT
  Filled 2015-08-31 (×2): qty 6

## 2015-08-31 MED ORDER — LOSARTAN POTASSIUM 50 MG PO TABS: 100.0000 mg | ORAL_TABLET | Freq: Every day | ORAL | Status: DC

## 2015-08-31 MED ORDER — CO Q 10 100 MG PO CAPS: 100.0000 mg | ORAL_CAPSULE | Freq: Every day | ORAL | Status: DC

## 2015-08-31 MED ORDER — ATORVASTATIN CALCIUM 80 MG PO TABS
80.0000 mg | ORAL_TABLET | Freq: Every day | ORAL | Status: DC
  Administered 2015-08-31 – 2015-09-06 (×7): 80 mg via ORAL
  Filled 2015-08-31: qty 2
  Filled 2015-08-31 (×5): qty 1
  Filled 2015-08-31: qty 2

## 2015-08-31 MED ORDER — TIOTROPIUM BROMIDE MONOHYDRATE 18 MCG IN CAPS
18.0000 ug | ORAL_CAPSULE | Freq: Every day | RESPIRATORY_TRACT | Status: DC
  Administered 2015-09-03 – 2015-09-06 (×4): 18 ug via RESPIRATORY_TRACT
  Filled 2015-08-31 (×2): qty 5

## 2015-08-31 MED ORDER — ACETAMINOPHEN 325 MG PO TABS: 650.0000 mg | ORAL_TABLET | ORAL | Status: DC | PRN

## 2015-08-31 MED ORDER — POLYETHYLENE GLYCOL 3350 17 G PO PACK
17.0000 g | PACK | Freq: Every day | ORAL | Status: DC
  Administered 2015-09-01 – 2015-09-06 (×5): 17 g via ORAL
  Filled 2015-08-31 (×6): qty 1

## 2015-08-31 MED ORDER — MORPHINE SULFATE (PF) 2 MG/ML IV SOLN
2.0000 mg | Freq: Four times a day (QID) | INTRAVENOUS | Status: DC | PRN
  Administered 2015-08-31 – 2015-09-03 (×4): 2 mg via INTRAVENOUS
  Filled 2015-08-31 (×5): qty 1

## 2015-08-31 MED ORDER — VERAPAMIL HCL ER 240 MG PO TBCR
240.0000 mg | EXTENDED_RELEASE_TABLET | Freq: Every day | ORAL | Status: DC
  Administered 2015-08-31 – 2015-09-02 (×3): 240 mg via ORAL
  Filled 2015-08-31 (×4): qty 1

## 2015-08-31 MED ORDER — ZOLPIDEM TARTRATE 5 MG PO TABS
5.0000 mg | ORAL_TABLET | Freq: Every day | ORAL | Status: DC
  Administered 2015-08-31 – 2015-09-05 (×7): 5 mg via ORAL
  Filled 2015-08-31 (×7): qty 1

## 2015-08-31 MED ORDER — PANTOPRAZOLE SODIUM 40 MG PO TBEC
40.0000 mg | DELAYED_RELEASE_TABLET | Freq: Every day | ORAL | Status: DC
  Administered 2015-08-31 – 2015-09-06 (×7): 40 mg via ORAL
  Filled 2015-08-31 (×7): qty 1

## 2015-08-31 MED ORDER — FLUTICASONE PROPIONATE 50 MCG/ACT NA SUSP
1.0000 | Freq: Every day | NASAL | Status: DC
  Administered 2015-08-31 – 2015-09-06 (×6): 1 via NASAL
  Filled 2015-08-31 (×2): qty 16

## 2015-08-31 MED ORDER — ALBUTEROL SULFATE (2.5 MG/3ML) 0.083% IN NEBU: 3.0000 mL | INHALATION_SOLUTION | Freq: Four times a day (QID) | RESPIRATORY_TRACT | Status: DC | PRN

## 2015-08-31 MED ORDER — LACTULOSE 10 GM/15ML PO SOLN
15.0000 g | Freq: Every day | ORAL | Status: DC
  Administered 2015-09-01 – 2015-09-06 (×4): 15 g via ORAL
  Filled 2015-08-31 (×6): qty 30

## 2015-08-31 MED ORDER — STROKE: EARLY STAGES OF RECOVERY BOOK
Freq: Once | Status: AC
  Administered 2015-08-31: 10:00:00
  Filled 2015-08-31: qty 1

## 2015-08-31 MED ORDER — METOPROLOL TARTRATE 50 MG PO TABS
75.0000 mg | ORAL_TABLET | Freq: Two times a day (BID) | ORAL | Status: DC
  Administered 2015-08-31 – 2015-09-02 (×6): 75 mg via ORAL
  Filled 2015-08-31 (×6): qty 1

## 2015-08-31 MED ORDER — RANOLAZINE ER 500 MG PO TB12
500.0000 mg | ORAL_TABLET | Freq: Two times a day (BID) | ORAL | Status: DC
  Administered 2015-08-31 (×2): 500 mg via ORAL
  Filled 2015-08-31 (×3): qty 1

## 2015-08-31 MED ORDER — MORPHINE SULFATE (PF) 4 MG/ML IV SOLN
4.0000 mg | Freq: Once | INTRAVENOUS | Status: AC
  Administered 2015-08-31: 4 mg via INTRAVENOUS
  Filled 2015-08-31: qty 1

## 2015-08-31 MED ORDER — SALINE SPRAY 0.65 % NA SOLN
1.0000 | Freq: Four times a day (QID) | NASAL | Status: DC
  Administered 2015-08-31 – 2015-09-05 (×7): 1 via NASAL
  Filled 2015-08-31 (×3): qty 44

## 2015-08-31 MED ORDER — DOCUSATE SODIUM 100 MG PO CAPS
100.0000 mg | ORAL_CAPSULE | Freq: Two times a day (BID) | ORAL | Status: DC
  Administered 2015-08-31 – 2015-09-06 (×12): 100 mg via ORAL
  Filled 2015-08-31 (×14): qty 1

## 2015-08-31 MED ORDER — ASPIRIN 300 MG RE SUPP
300.0000 mg | Freq: Every day | RECTAL | Status: DC
  Administered 2015-09-01: 300 mg via RECTAL
  Filled 2015-08-31: qty 1

## 2015-08-31 MED ORDER — INSULIN ASPART 100 UNIT/ML ~~LOC~~ SOLN
0.0000 [IU] | Freq: Three times a day (TID) | SUBCUTANEOUS | Status: DC
  Administered 2015-08-31: 2 [IU] via SUBCUTANEOUS
  Administered 2015-08-31: 3 [IU] via SUBCUTANEOUS
  Administered 2015-09-01: 2 [IU] via SUBCUTANEOUS
  Administered 2015-09-01 – 2015-09-04 (×2): 3 [IU] via SUBCUTANEOUS

## 2015-08-31 MED ORDER — SODIUM CHLORIDE 0.9 % IV SOLN
INTRAVENOUS | Status: AC
  Administered 2015-08-31: 02:00:00 via INTRAVENOUS

## 2015-08-31 MED ORDER — OMEGA-3-ACID ETHYL ESTERS 1 G PO CAPS
2.0000 g | ORAL_CAPSULE | Freq: Every morning | ORAL | Status: DC
  Administered 2015-08-31 – 2015-09-06 (×7): 2 g via ORAL
  Filled 2015-08-31 (×10): qty 2

## 2015-08-31 MED ORDER — MONTELUKAST SODIUM 10 MG PO TABS
10.0000 mg | ORAL_TABLET | Freq: Every day | ORAL | Status: DC
  Administered 2015-08-31 – 2015-09-05 (×7): 10 mg via ORAL
  Filled 2015-08-31 (×7): qty 1

## 2015-08-31 MED ORDER — ASPIRIN 325 MG PO TABS
325.0000 mg | ORAL_TABLET | Freq: Every day | ORAL | Status: DC
  Administered 2015-08-31 – 2015-09-06 (×6): 325 mg via ORAL
  Filled 2015-08-31 (×7): qty 1

## 2015-08-31 NOTE — Clinical Social Work Note (Signed)
Clinical Social Work Assessment  Patient Details  Name: Henry [name] MRN: [id number] Date of Birth: 10-21-1940  Date of referral:  08/31/15               Reason for consult:                   Permission sought to share information with:    Permission granted to share information::     Name::     wife at bedside  Agency::     Relationship::     Contact Information:     Housing/Transportation Living arrangements for the past 2 months:  [place] of Information:  Patient, Adult Children, Facility Patient Interpreter Needed:  None Criminal Activity/Legal Involvement Pertinent to Current Situation/Hospitalization:  No - Comment as needed Significant Relationships:    Lives with:  Facility Resident Do you feel safe going back to the place where you live?    Need for family participation in patient care:     Care giving concerns:  Re-admit from [facility]   Social Worker assessment / plan:  FL2 updated  Employment status:    Insurance information:    PT Recommendations:    Information / Referral to community resources:     Patient/Family's Response to care:  CSW met with patient and his family at bedside- patient re-admitted from [facility]. Plan return at dc.   Patient/Family's Understanding of and Emotional Response to Diagnosis, Current Treatment, and Prognosis:  Optimistic   Emotional Assessment Appearance:    Attitude/Demeanor/Rapport:    Affect (typically observed):  Appropriate Orientation:  Oriented to Self, Oriented to Place, Oriented to  Time, Oriented to Situation Alcohol / Substance use:  Not Applicable Psych involvement (Current and /or in the community):  No (Comment)  Discharge Needs  Concerns to be addressed:  Discharge Planning Concerns Readmission within the last 30 days:    Current discharge risk:  None Barriers to Discharge:  No Barriers Identified   [name], LCSW 08/31/2015, 11:59 AM

## 2015-08-31 NOTE — Progress Notes (Addendum)
[facility] PROGRESS NOTE  LOWEL BESTON [id number] DOB: [birth year] DOA: 08/30/2015 PCP: Sherrie Mustache, MD  Assessment/Plan: 1.  TIA/CVA -Mr. Oseguera was discharged from my service on 08/30/2015 during which time he was evaluated for chest pain. Cardiology recommending continuing medical management.  - At a skilled nursing facility he was found by nursing staff to have left-sided facial droop. Patient reporting bilateral blurry vision  -He was brought back to the emergency department where initial CT scan of brain without contrast was negative for acute intracranial abnormality.  -Neurologic symptoms resolving by the time he reached the emergency room.  -He was placed on CVA protocol. MRI of brain could not be performed due to external fixator  -He recently had a transthoracic echocardiogram which did not reveal intracardiac source of clot  -Pending bilateral carotid Dopplers -Plan to repeat CT scan of the brain since external fixator precludes ability to perform MRI of brain -Neurology consulted to provide input on antiplatelet therapy. Already anticoagulated.   2.   Chest pain/history of coronary artery disease. 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 His last cardiac catheterization was performed in 2012 that showed occluded venous grafts as well as severe circumflex disease. Cardiology at the time recommended medical management due to diffuse disease with very small caliber vessels. -He was recently admitted for chest pain and evaluated by cardiology who recommended ongoing medical management. His Ranexa was titrated up to 1000 mg by mouth twice a day -He continues to report chest pain although appearance to be atypical in nature. -Cardiology consultation requested given ongoing symptoms.  3. History recurrent pulmonary embolism. -He has a history of lupus anticoagulant is chronically  anticoagulated with Xarelto  4.  History of right Avulsion fracture  -He has an external fixator in place.  -Staff reporting maloder, although on exam there does not appear to be convincing evidence of infection. There was mild erythema around the screws, no obvious purulence or discharge.  -Will consult wound care   5.  Dyslipidemia  -Continue Lipitor 80 mg by mouth daily   6.  Hypertension  -Continue Imdur for 120 mg by mouth daily, verapamil 240 mg by mouth daily, metoprolol succinate 5 mg by mouth twice a day   7. Acute Kidney Injury -Labs showing creatinine of 1.65 on 08/31/2015 -Likely secondary to hypovolemia/prerenal azothemia.  -Lasix held as he was held as he was hydrated with NS overnight.   Code Status: Full Code Family Communication:  Disposition Plan: Plan to repeat CT scan of brain   Consultants:  Cardiology   HPI/Subjective: He denies having further neurologic deficits with left sided weakness resolving.   Objective: Filed Vitals:   08/31/15 1347 08/31/15 1411  BP: 99/55 117/66  Pulse: 72 98  Temp: 98.2 F (36.8 C)   Resp: 18 18    Intake/Output Summary (Last 24 hours) at 08/31/15 1606 Last data filed at 08/31/15 0900  Gross per 24 hour  Intake    120 ml  Output    500 ml  Net   -380 ml   Filed Weights   08/31/15 0005  Weight: 78.291 kg (172 lb 9.6 oz)    Exam:   General: No acute distress, awake and alert, reporting 5/10 CP  Cardiovascular: S1 and s2, no rubs or gallops  Respiratory: good air movement, no wheezing, no crackles   Abdomen: soft, NT, ND, positive BS  Musculoskeletal: trace edema on his LLE, RLE  with 1 plus edema and external fixator in place  Neurological: He has 4/5 muscle strength to left upper extremity, has 5/5 muscle strength to right upper extremity and left lower extremity. An external fixator to right lower extremity in place could not evaluate neurologic strength on this extremity.    Data Reviewed: Basic  Metabolic Panel:  Recent Labs Lab 08/27/15 1609 08/28/15 0437 08/30/15 2010 08/31/15 0722  NA 137 137 133* 135  K 4.1 4.5 4.9 4.6  CL 102 105 98* 103  CO2 25 23 24 26   GLUCOSE 149* 130* 177* 131*  BUN 14 12 22* 24*  CREATININE 1.06 1.01 1.65* 1.32*  CALCIUM 8.9 8.7* 8.5* 8.6*   Liver Function Tests:  Recent Labs Lab 08/30/15 2010  AST 25  ALT 15*  ALKPHOS 57  BILITOT 0.4  PROT 7.1  ALBUMIN 2.5*   No results for input(s): LIPASE, AMYLASE in the last 168 hours. No results for input(s): AMMONIA in the last 168 hours. CBC:  Recent Labs Lab 08/27/15 1609 08/28/15 0437 08/30/15 1032 08/30/15 2010 08/31/15 0722  WBC 11.4* 9.8 10.0 17.6* 13.0*  NEUTROABS  --   --   --  10.9*  --   HGB 9.4* 8.6* 8.2* 8.3* 7.7*  HCT 31.1* 27.6* 26.3* 27.1* 24.6*  MCV 84.5 84.4 83.2 84.7 83.1  PLT 485* 501* 499* 585* 488*   Cardiac Enzymes:  Recent Labs Lab 08/28/15 0110 08/28/15 0435 08/30/15 2010 08/31/15 0724 08/31/15 1250  TROPONINI 0.14* 0.18* 0.18* 0.48* 0.83*   BNP (last 3 results)  Recent Labs  02/05/15 1735  BNP 120.1*    ProBNP (last 3 results) No results for input(s): PROBNP in the last 8760 hours.  CBG:  Recent Labs Lab 08/30/15 1715 08/30/15 2033 08/31/15 0037 08/31/15 0800 08/31/15 1137  GLUCAP 163* 147* 153* 124* 118*    No results found for this or any previous visit (from the past 240 hour(s)).   Studies: Ct Angio Head W/cm &/or Wo Cm  08/30/2015  CLINICAL DATA:  LEFT-sided weakness today.  Code stroke. EXAM: CT ANGIOGRAPHY HEAD AND NECK TECHNIQUE: Multidetector CT imaging of the head and neck was performed using the standard protocol during bolus administration of intravenous contrast. Multiplanar CT image reconstructions and MIPs were obtained to evaluate the vascular anatomy. Carotid stenosis measurements (when applicable) are obtained utilizing NASCET criteria, using the distal internal carotid diameter as the denominator. CONTRAST:  161mL  OMNIPAQUE IOHEXOL 350 MG/ML SOLN COMPARISON:  CT head August 30, 2015 at 2042 hours. FINDINGS: CTA NECK Aortic arch: Normal appearance of the thoracic arch, normal branch pattern. Mild calcific atherosclerosis. The origins of the innominate, left Common carotid artery and subclavian artery are widely patent. Right carotid system: Common carotid artery is patent, coursing in a straight line fashion. Eccentric intimal thickening and calcific atherosclerosis results in very mild stenosis mid RIGHT Common carotid artery. Eccentric calcific atherosclerosis results in 65-70% stenosis RIGHT internal carotid artery, 17 mm segment. Mild poststenotic dilatation. Normal appearance of the included internal carotid artery. Left carotid system: Common carotid artery is widely patent, coursing in a straight line fashion. Eccentric calcific atherosclerosis results in less than 50% stenosis. Normal appearance of the mid to distal cervical internal carotid artery. Vertebral arteries:Calcific atherosclerosis results in severe stenosis bilateral vertebral artery origins, with mild poststenotic dilatation distal LEFT V4 segment. LEFT vertebral artery is dominant. Extrinsic calcific atherosclerosis results in moderate tandem stenosis LEFT V4 segment. Skeleton: No acute osseous process though bone windows have not been  submitted. Poor dentition with multiple dental caries, and absent teeth. C5-6 intradiscal calcifications, and findings of auto interbody arthrodesis. Severe C6-7 degenerative disc. Severe LEFT upper cervical facet arthropathy. Status post median sternotomy. Other neck: Soft tissues of the neck are nonacute though, not tailored for evaluation. CTA HEAD Anterior circulation: Patent cervical internal carotid arteries, petrous, cavernous and supra clinoid internal carotid arteries. Calcific atherosclerosis of the carotid siphons results in at least moderate stenosis of bilateral supraclinoid internal carotid arteries. Widely  patent anterior communicating artery. Patent anterior and middle cerebral arteries. Mild stenosis proximal RIGHT A1 segment. Posterior circulation: RIGHT vertebral artery terminates in the posterior inferior cerebellar artery. Heavy calcification of distal LEFT V4 segment resulting at least moderate long segment of stenosis to proximal basilar artery which is patent. Bilateral small posterior communicating arteries are present. High-grade stenosis RIGHT P1 segment. Bilateral posterior cerebral arteries are patent. Mild tandem stenosis LEFT posterior cerebral artery compatible with atherosclerosis. No large vessel occlusion, dissection, contrast extravasation or aneurysm within the anterior nor posterior circulation. No abnormal intracranial enhancement. IMPRESSION: CTA NECK: Atherosclerosis resulting in 65-70% stenosis RIGHT internal carotid artery origin. Less than 50% stenosis LEFT internal carotid artery origin. High-grade stenosis bilateral vertebral artery origins, which are patent. CTA HEAD:  No acute large vessel occlusion. Moderate stenosis of bilateral supraclinoid internal carotid artery due to calcific atherosclerosis. Moderate stenosis LEFT V4 segment. High-grade stenosis RIGHT P1 segment, complete circle of Willis. Electronically Signed   By: Elon Alas M.D.   On: 08/30/2015 22:15   Ct Head Wo Contrast  08/30/2015  CLINICAL DATA:  Code stroke.  Left facial droop. EXAM: CT HEAD WITHOUT CONTRAST TECHNIQUE: Contiguous axial images were obtained from the base of the skull through the vertex without intravenous contrast. COMPARISON:  04/03/2015. FINDINGS: There is no evidence for acute hemorrhage, hydrocephalus, mass lesion, or abnormal extra-axial fluid collection. No definite CT evidence for acute infarction. Diffuse loss of parenchymal volume is consistent with atrophy. Patchy low attenuation in the deep hemispheric and periventricular white matter is nonspecific, but likely reflects chronic  microvascular ischemic demyelination. The visualized paranasal sinuses and mastoid air cells are clear. IMPRESSION: Stable.  No acute intracranial abnormality. Atrophy with chronic small vessel white matter ischemic disease. Critical Value/emergent results were called by me at the time of interpretation on 08/30/2015 at 8:23 pm to Dr. Milton Ferguson , who verbally acknowledged these results. Electronically Signed   By: Misty Stanley M.D.   On: 08/30/2015 20:23   Ct Angio Neck W/cm &/or Wo/cm  08/30/2015  CLINICAL DATA:  LEFT-sided weakness today.  Code stroke. EXAM: CT ANGIOGRAPHY HEAD AND NECK TECHNIQUE: Multidetector CT imaging of the head and neck was performed using the standard protocol during bolus administration of intravenous contrast. Multiplanar CT image reconstructions and MIPs were obtained to evaluate the vascular anatomy. Carotid stenosis measurements (when applicable) are obtained utilizing NASCET criteria, using the distal internal carotid diameter as the denominator. CONTRAST:  172mL OMNIPAQUE IOHEXOL 350 MG/ML SOLN COMPARISON:  CT head August 30, 2015 at 2042 hours. FINDINGS: CTA NECK Aortic arch: Normal appearance of the thoracic arch, normal branch pattern. Mild calcific atherosclerosis. The origins of the innominate, left Common carotid artery and subclavian artery are widely patent. Right carotid system: Common carotid artery is patent, coursing in a straight line fashion. Eccentric intimal thickening and calcific atherosclerosis results in very mild stenosis mid RIGHT Common carotid artery. Eccentric calcific atherosclerosis results in 65-70% stenosis RIGHT internal carotid artery, 17 mm segment. Mild poststenotic dilatation.  Normal appearance of the included internal carotid artery. Left carotid system: Common carotid artery is widely patent, coursing in a straight line fashion. Eccentric calcific atherosclerosis results in less than 50% stenosis. Normal appearance of the mid to distal  cervical internal carotid artery. Vertebral arteries:Calcific atherosclerosis results in severe stenosis bilateral vertebral artery origins, with mild poststenotic dilatation distal LEFT V4 segment. LEFT vertebral artery is dominant. Extrinsic calcific atherosclerosis results in moderate tandem stenosis LEFT V4 segment. Skeleton: No acute osseous process though bone windows have not been submitted. Poor dentition with multiple dental caries, and absent teeth. C5-6 intradiscal calcifications, and findings of auto interbody arthrodesis. Severe C6-7 degenerative disc. Severe LEFT upper cervical facet arthropathy. Status post median sternotomy. Other neck: Soft tissues of the neck are nonacute though, not tailored for evaluation. CTA HEAD Anterior circulation: Patent cervical internal carotid arteries, petrous, cavernous and supra clinoid internal carotid arteries. Calcific atherosclerosis of the carotid siphons results in at least moderate stenosis of bilateral supraclinoid internal carotid arteries. Widely patent anterior communicating artery. Patent anterior and middle cerebral arteries. Mild stenosis proximal RIGHT A1 segment. Posterior circulation: RIGHT vertebral artery terminates in the posterior inferior cerebellar artery. Heavy calcification of distal LEFT V4 segment resulting at least moderate long segment of stenosis to proximal basilar artery which is patent. Bilateral small posterior communicating arteries are present. High-grade stenosis RIGHT P1 segment. Bilateral posterior cerebral arteries are patent. Mild tandem stenosis LEFT posterior cerebral artery compatible with atherosclerosis. No large vessel occlusion, dissection, contrast extravasation or aneurysm within the anterior nor posterior circulation. No abnormal intracranial enhancement. IMPRESSION: CTA NECK: Atherosclerosis resulting in 65-70% stenosis RIGHT internal carotid artery origin. Less than 50% stenosis LEFT internal carotid artery origin.  High-grade stenosis bilateral vertebral artery origins, which are patent. CTA HEAD:  No acute large vessel occlusion. Moderate stenosis of bilateral supraclinoid internal carotid artery due to calcific atherosclerosis. Moderate stenosis LEFT V4 segment. High-grade stenosis RIGHT P1 segment, complete circle of Willis. Electronically Signed   By: Elon Alas M.D.   On: 08/30/2015 22:15   Dg Chest Port 1 View  08/31/2015  CLINICAL DATA:  Chest pain and shortness of Breath EXAM: PORTABLE CHEST 1 VIEW COMPARISON:  08/27/2015 FINDINGS: Cardiac shadow is stable. Postsurgical changes are again noted. Lungs are clear bilaterally. No acute bony abnormality is seen. IMPRESSION: No active disease. Electronically Signed   By: Inez Catalina M.D.   On: 08/31/2015 12:55    Scheduled Meds: . aspirin  300 mg Rectal Daily   Or  . aspirin  325 mg Oral Daily  . atorvastatin  80 mg Oral Daily  . budesonide-formoterol  2 puff Inhalation BID  . docusate sodium  100 mg Oral BID  . fluticasone  1 spray Each Nare Daily  . insulin aspart  0-15 Units Subcutaneous TID WC  . isosorbide mononitrate  120 mg Oral Daily  . lactulose  15 g Oral Daily  . metoprolol  75 mg Oral BID  . montelukast  10 mg Oral QHS  . omega-3 acid ethyl esters  2 g Oral q morning - 10a  . pantoprazole  40 mg Oral Daily  . polyethylene glycol  17 g Oral Daily  . ranolazine  500 mg Oral BID  . rivaroxaban  20 mg Oral Daily  . sodium chloride  1 spray Each Nare QID  . tiotropium  18 mcg Inhalation Daily  . verapamil  240 mg Oral Daily  . zolpidem  5 mg Oral QHS   Continuous Infusions:  Principal Problem:   Stroke [facility]) Active Problems:   Chest pain   Essential hypertension, benign   Anemia   Type 2 diabetes mellitus (HCC)   Lupus anticoagulant positive   CKD (chronic kidney disease), stage II   COPD, moderate (HCC)   AKI (acute kidney injury) ([place])   Troponin level elevated   Atherosclerosis of native coronary artery of native  heart with other form of angina pectoris (HCC)   Thrombocytosis (HCC)   Hyponatremia   Chronic diastolic CHF (congestive heart failure) ([place])   Stroke (cerebrum) ([place])   Atherosclerosis of native coronary artery of native heart    Time spent:     Kelvin Cellar  [facility] Pager [phone]. If 7PM-7AM, please contact night-coverage at [email or web address], password [facility] 08/31/2015, 4:06 PM  LOS: 1 day

## 2015-08-31 NOTE — Progress Notes (Signed)
CRITICAL VALUE ALERT  Critical value received:  Troponin  Date of notification:  08/31/2015  Time of notification:  [id number]  Critical value read back:Yes.    Nurse who received alert:  Smitty Knudsen, LPN  MD notified (1st page):  Dr. Coralyn Pear  Time of first page:  [phone]  MD notified (2nd page):  Time of second page:  Responding MD:  Dr. Coralyn Pear  Time MD responded:  Dr. Coralyn Pear returned paged and stated to also notify Dr. Harl Bowie.  Dr. Harl Bowie paged and immediately returned paged.  Notified of patient's Troponin.  No new orders at this time.  Stress test scheduled for tomorrow (Friday).

## 2015-08-31 NOTE — H&P (Signed)
[facility] History and Physical  KEYMANI BETTEN [id number] DOB: 1940-09-05 DOA: 08/30/2015  Referring physician: ED physician PCP: Sherrie Mustache, MD  Specialists:  Dr. Domenic Polite (cardiology), Dr. Lake Bells (pulmonology)  Chief Complaint:  Acute left-sided weakness   HPI: Henry [name] is a 75 y.o. male with PMH of ASCVD status post CABG and subsequent stenting, COPD, hypercoagulable state on Xarelto, and chronic diastolic CHF who presents to the ED with acute onset of left-sided weakness and vision change. Patient was just discharged back to his SNF from this institution on 08/30/2015 following medical management of chest pain with elevated troponin. Patient was discharged in approximately 6 PM, and at approximately 19:10, he was noted by SNF personal to have left-sided facial droop. EMS was activated and symptoms began to resolve prior to their arrival. Patient reports an associated acute onset of blurred vision that coincided with the left-sided weakness. Patient denied headache, chest pain at that time, or palpitations. There was no nausea, vomiting, fever, or chills. He denies history of migraine headaches and denies experiencing similar symptoms previously.  In ED, patient was found to be afebrile, saturating well on room air, and with vital signs stable. He reported full resolution of all focal neurologic deficits. Noncontrast head CT was obtained and negative for acute intracranial abnormality. CTA head and neck demonstrates 50-75% stenosis of bilateral internal carotid arteries and high-grade stenosis of bilateral vertebral arteries. CMP features and acute kidney injury with serum creatinine of 1.65, up from 1.02 days prior. Also noted on the chemistry panel is hyponatremia, hypochloremia, and hyperglycemia. CBC features a leukocytosis of 17,600 and a stable normocytic anemia with hemoglobin of 8.3. Troponin returned elevated at 0.26 and EKG featured in accelerated junctional  rhythm with borderline ST changes in the anterolateral leads. On-call neurologist at [facility] was contacted from the ED and recommended admission to [facility] for medical management. Patient remained hemodynamically stable in the emergency department and will be admitted to hospital for ongoing evaluation and management of suspected CVA/TIA and chest pain.  Where does patient live?  SNF     Can patient participate in ADLs?  Yes         Review of Systems:   General: no fevers, chills, sweats, weight change, poor appetite, or fatigue HEENT: no hearing changes or sore throat. Blurred vision, resolved PTA Pulm: no dyspnea, cough, or wheeze CV: no palpitations. Chest pressure Abd: no nausea, vomiting, abdominal pain or diarrhea. Chronic constipation GU: no dysuria, hematuria, increased urinary frequency, or urgency  Ext: no leg edema Neuro: no hearing loss. Left-sided weakness, numbness resolved PTA Skin: no rash, no wounds MSK: No muscle spasm, no deformity, no red, hot, or swollen joint Heme: No easy bruising or bleeding Travel history: No recent long distant travel    Allergy:  Allergies  Allergen Reactions  . Ace Inhibitors Cough    Cough  . Gabapentin Other (See Comments)    WAS BEHAVING VERY ODDLY    Past Medical History  Diagnosis Date  . Coronary atherosclerosis of native coronary artery     a. NSTEMI 2005 s/p CABG (LIMA - LAD, SVG - OM, SVG - PDA). b. DES to Cx 02/2005. c. NSTEMI 07/2010 due to occ Cx - med rx. Known occluded vein grafts.  . Essential hypertension, benign   . Type 2 diabetes mellitus ([place])   . Dyslipidemia   . ACE inhibitor intolerance   . Asthma   . Recurrent pulmonary embolism ([place])     a.  01/2011. b. Bilateral PE 11/2011 - + lupus anticoagulant preliminary testing. Felt to require lifelong Coumadin.  . Pulmonary nodule     RML by CT 11/2011, stable compared to 2012.  Marland Kitchen Cholelithiasis   . Pulmonary HTN ([place])     Echo 11/2011 at time of PE. PA pressure  not assessed on 03/2012 echo but RV was back to normal.  . Lupus anticoagulant positive   . GERD (gastroesophageal reflux disease)   . COPD (chronic obstructive pulmonary disease) ([place])   . OSA (obstructive sleep apnea)     Past Surgical History  Procedure Laterality Date  . Coronary artery bypass graft  2005     LIMA to LAD, SVG to OM, SVG to RCA  . L4-l5 laminectomy    . Colonoscopy    . Esophagogastroduodenoscopy N/A 12/19/2013    Procedure: ESOPHAGOGASTRODUODENOSCOPY (EGD);  Surgeon: Lafayette Dragon, MD;  Location: [facility] ENDOSCOPY;  Service: Endoscopy;  Laterality: N/A;  . Kidney stone removed      Social History:  reports that he quit smoking about 43 years ago. His smoking use included Cigarettes. He started smoking about 69 years ago. He has a 9 pack-year smoking history. His smokeless tobacco use includes Chew. He reports that he does not drink alcohol or use illicit drugs.  Family History:  Family History  Problem Relation Age of Onset  . Heart disease Father   . Heart disease Sister   . Breast cancer Sister   . Heart disease Brother      Prior to Admission medications   Medication Sig Start Date End Date Taking? Authorizing Provider  acetaminophen (TYLENOL) 325 MG tablet Take 650 mg by mouth every 4 (four) hours as needed for mild pain. For pain   Yes Historical Provider, MD  albuterol (PROAIR HFA) 108 (90 BASE) MCG/ACT inhaler Inhale 2 puffs into the lungs every 6 (six) hours as needed for wheezing or shortness of breath.  02/11/14 02/11/16 Yes Historical Provider, MD  atorvastatin (LIPITOR) 80 MG tablet Take 1 tablet (80 mg total) by mouth daily. 02/09/15  Yes Geradine Girt, DO  budesonide-formoterol (SYMBICORT) 160-4.5 MCG/ACT inhaler Inhale 2 puffs into the lungs 2 (two) times daily.   Yes Historical Provider, MD  Coenzyme Q10 (CO Q 10) 100 MG CAPS Take 100 mg by mouth daily.   Yes Historical Provider, MD  docusate sodium (COLACE) 100 MG capsule Take 100 mg by mouth 2 (two)  times daily.   Yes Historical Provider, MD  fish oil-omega-3 fatty acids 1000 MG capsule Take 2 g by mouth every morning.    Yes Historical Provider, MD  fluticasone (FLONASE) 50 MCG/ACT nasal spray Place 1 spray into both nostrils daily.    Yes Historical Provider, MD  furosemide (LASIX) 20 MG tablet Take 20 mg by mouth daily.   Yes Historical Provider, MD  HYDROcodone-acetaminophen (NORCO) 7.5-325 MG tablet Take 1 tablet by mouth every 6 (six) hours as needed for moderate pain. Patient taking differently: Take 1 tablet by mouth 2 (two) times daily. *May take every 4 hours as needed for pain 08/30/15  Yes Kelvin Cellar, MD  isosorbide mononitrate (IMDUR) 120 MG 24 hr tablet Take 120 mg by mouth daily.   Yes Historical Provider, MD  lactulose (CHRONULAC) 10 GM/15ML solution Take 15 g by mouth daily.    Yes Historical Provider, MD  losartan (COZAAR) 100 MG tablet Take 1 tablet (100 mg total) by mouth daily. 01/18/15  Yes Satira Sark, MD  metoprolol Tonia [place])  50 MG tablet TAKE 1 AND 1/2 TABLETS BY MOUTH TWICE DAILY 05/22/15  Yes Satira Sark, MD  montelukast (SINGULAIR) 10 MG tablet Take 10 mg by mouth at bedtime.   Yes Historical Provider, MD  nitroGLYCERIN (NITROSTAT) 0.4 MG SL tablet Place 1 tablet (0.4 mg total) under the tongue every 5 (five) minutes x 3 doses as needed. For chest pain 04/05/14  Yes Satira Sark, MD  omeprazole (PRILOSEC) 40 MG capsule Take 40 mg by mouth every evening.   Yes Historical Provider, MD  polyethylene glycol (MIRALAX / GLYCOLAX) packet Take 17 g by mouth daily.   Yes Historical Provider, MD  potassium chloride (K-DUR) 10 MEQ tablet Take 10 mEq by mouth every morning.   Yes Historical Provider, MD  rivaroxaban (XARELTO) 20 MG TABS tablet Take 20 mg by mouth daily.   Yes Historical Provider, MD  sitaGLIPtin (JANUVIA) 25 MG tablet Take 1 tablet (25 mg total) by mouth daily. 02/09/15  Yes Geradine Girt, DO  sodium chloride (OCEAN) 0.65 % SOLN nasal spray  Place 1 spray into both nostrils 4 (four) times daily.   Yes Historical Provider, MD  tiotropium (SPIRIVA) 18 MCG inhalation capsule Place 1 capsule (18 mcg total) into inhaler and inhale daily. 11/16/14  Yes Juanito Doom, MD  verapamil (CALAN-SR) 240 MG CR tablet TAKE 1 TABLET (240 MG TOTAL) BY MOUTH DAILY. 05/20/14  Yes Satira Sark, MD  zolpidem (AMBIEN) 10 MG tablet Take 10 mg by mouth at bedtime. 01/30/15  Yes Historical Provider, MD  chlorthalidone (HYGROTON) 25 MG tablet TAKE 1 TABLET BY MOUTH DAILY Patient not taking: Reported on 08/30/2015 12/09/14   Satira Sark, MD  ranolazine (RANEXA) 1000 MG SR tablet Take 1 tablet (1,000 mg total) by mouth 2 (two) times daily. Patient not taking: Reported on 08/30/2015 08/30/15   Kelvin Cellar, MD  sertraline (ZOLOFT) 25 MG tablet Take 1 tablet (25 mg total) by mouth daily. Patient not taking: Reported on 08/30/2015 08/30/15   Kelvin Cellar, MD    Physical Exam: Filed Vitals:   08/30/15 2245 08/30/15 2300 08/30/15 2315 08/31/15 0005  BP: 105/65 115/71 105/64 117/71  Pulse:   82 88  Temp:    99.3 F (37.4 C)  TempSrc:    Oral  Resp: 21 14 19 18   Height:    5\' 8"  (1.727 m)  Weight:    78.291 kg (172 lb 9.6 oz)  SpO2:   93% 94%   General: Not in acute distress HEENT:       Eyes: PERRL, EOMI, no scleral icterus or conjunctival pallor.       ENT: No discharge from the ears or nose, no pharyngeal ulcers, petechiae or exudate. Left pinna with verrucous growth and ulceration, bleeding         Neck: No JVD, no bruit, no appreciable mass Heme: No cervical adenopathy, no pallor Cardiac: Rate ~60 and irregular, grade III mid-systolic murmur at LSB, No gallops or rubs. Pulm: Good air movement bilaterally. No rales, wheezing, rhonchi or rubs. Abd: Soft, nondistended, nontender, no rebound pain or gaurding, no mass or organomegaly, BS present. Ext: No LE edema bilaterally. 1+DP/PT pulse bilaterally. Musculoskeletal: No red, hot, swollen  joints. Right leg externally fixated, no erythema or drainage at rod entry points. Left leg with dressed wounds  Skin: No rashes or wounds on exposed surfaces  Neuro: Alert, oriented X3, cranial nerves II-XII grossly intact, muscle strength 5/5 in all extremities, sensation to light touch intact.  Brachial reflex 2+ bilaterally. Knee reflex 2+ bilaterally. Negative Babinski's sign. Normal finger to nose test. No focal findings Psych: Patient is not overtly psychotic, appropriate mood and affect.  Labs on Admission:  Basic Metabolic Panel:  Recent Labs Lab 08/27/15 1609 08/28/15 0437 08/30/15 2010  NA 137 137 133*  K 4.1 4.5 4.9  CL 102 105 98*  CO2 25 23 24   GLUCOSE 149* 130* 177*  BUN 14 12 22*  CREATININE 1.06 1.01 1.65*  CALCIUM 8.9 8.7* 8.5*   Liver Function Tests:  Recent Labs Lab 08/30/15 2010  AST 25  ALT 15*  ALKPHOS 57  BILITOT 0.4  PROT 7.1  ALBUMIN 2.5*   No results for input(s): LIPASE, AMYLASE in the last 168 hours. No results for input(s): AMMONIA in the last 168 hours. CBC:  Recent Labs Lab 08/27/15 1609 08/28/15 0437 08/30/15 1032 08/30/15 2010  WBC 11.4* 9.8 10.0 17.6*  NEUTROABS  --   --   --  10.9*  HGB 9.4* 8.6* 8.2* 8.3*  HCT 31.1* 27.6* 26.3* 27.1*  MCV 84.5 84.4 83.2 84.7  PLT 485* 501* 499* 585*   Cardiac Enzymes:  Recent Labs Lab 08/27/15 2224 08/28/15 0110 08/28/15 0435  TROPONINI 0.13* 0.14* 0.18*    BNP (last 3 results)  Recent Labs  02/05/15 1735  BNP 120.1*    ProBNP (last 3 results) No results for input(s): PROBNP in the last 8760 hours.  CBG:  Recent Labs Lab 08/30/15 0721 08/30/15 1151 08/30/15 1715 08/30/15 2033 08/31/15 0037  GLUCAP 123* 159* 163* 147* 153*    Radiological Exams on Admission: Ct Angio Head W/cm &/or Wo Cm  08/30/2015  CLINICAL DATA:  LEFT-sided weakness today.  Code stroke. EXAM: CT ANGIOGRAPHY HEAD AND NECK TECHNIQUE: Multidetector CT imaging of the head and neck was performed  using the standard protocol during bolus administration of intravenous contrast. Multiplanar CT image reconstructions and MIPs were obtained to evaluate the vascular anatomy. Carotid stenosis measurements (when applicable) are obtained utilizing NASCET criteria, using the distal internal carotid diameter as the denominator. CONTRAST:  122mL OMNIPAQUE IOHEXOL 350 MG/ML SOLN COMPARISON:  CT head August 30, 2015 at 2042 hours. FINDINGS: CTA NECK Aortic arch: Normal appearance of the thoracic arch, normal branch pattern. Mild calcific atherosclerosis. The origins of the innominate, left Common carotid artery and subclavian artery are widely patent. Right carotid system: Common carotid artery is patent, coursing in a straight line fashion. Eccentric intimal thickening and calcific atherosclerosis results in very mild stenosis mid RIGHT Common carotid artery. Eccentric calcific atherosclerosis results in 65-70% stenosis RIGHT internal carotid artery, 17 mm segment. Mild poststenotic dilatation. Normal appearance of the included internal carotid artery. Left carotid system: Common carotid artery is widely patent, coursing in a straight line fashion. Eccentric calcific atherosclerosis results in less than 50% stenosis. Normal appearance of the mid to distal cervical internal carotid artery. Vertebral arteries:Calcific atherosclerosis results in severe stenosis bilateral vertebral artery origins, with mild poststenotic dilatation distal LEFT V4 segment. LEFT vertebral artery is dominant. Extrinsic calcific atherosclerosis results in moderate tandem stenosis LEFT V4 segment. Skeleton: No acute osseous process though bone windows have not been submitted. Poor dentition with multiple dental caries, and absent teeth. C5-6 intradiscal calcifications, and findings of auto interbody arthrodesis. Severe C6-7 degenerative disc. Severe LEFT upper cervical facet arthropathy. Status post median sternotomy. Other neck: Soft tissues of  the neck are nonacute though, not tailored for evaluation. CTA HEAD Anterior circulation: Patent cervical internal carotid arteries, petrous, cavernous  and supra clinoid internal carotid arteries. Calcific atherosclerosis of the carotid siphons results in at least moderate stenosis of bilateral supraclinoid internal carotid arteries. Widely patent anterior communicating artery. Patent anterior and middle cerebral arteries. Mild stenosis proximal RIGHT A1 segment. Posterior circulation: RIGHT vertebral artery terminates in the posterior inferior cerebellar artery. Heavy calcification of distal LEFT V4 segment resulting at least moderate long segment of stenosis to proximal basilar artery which is patent. Bilateral small posterior communicating arteries are present. High-grade stenosis RIGHT P1 segment. Bilateral posterior cerebral arteries are patent. Mild tandem stenosis LEFT posterior cerebral artery compatible with atherosclerosis. No large vessel occlusion, dissection, contrast extravasation or aneurysm within the anterior nor posterior circulation. No abnormal intracranial enhancement. IMPRESSION: CTA NECK: Atherosclerosis resulting in 65-70% stenosis RIGHT internal carotid artery origin. Less than 50% stenosis LEFT internal carotid artery origin. High-grade stenosis bilateral vertebral artery origins, which are patent. CTA HEAD:  No acute large vessel occlusion. Moderate stenosis of bilateral supraclinoid internal carotid artery due to calcific atherosclerosis. Moderate stenosis LEFT V4 segment. High-grade stenosis RIGHT P1 segment, complete circle of Willis. Electronically Signed   By: Elon Alas M.D.   On: 08/30/2015 22:15   Ct Head Wo Contrast  08/30/2015  CLINICAL DATA:  Code stroke.  Left facial droop. EXAM: CT HEAD WITHOUT CONTRAST TECHNIQUE: Contiguous axial images were obtained from the base of the skull through the vertex without intravenous contrast. COMPARISON:  04/03/2015. FINDINGS: There  is no evidence for acute hemorrhage, hydrocephalus, mass lesion, or abnormal extra-axial fluid collection. No definite CT evidence for acute infarction. Diffuse loss of parenchymal volume is consistent with atrophy. Patchy low attenuation in the deep hemispheric and periventricular white matter is nonspecific, but likely reflects chronic microvascular ischemic demyelination. The visualized paranasal sinuses and mastoid air cells are clear. IMPRESSION: Stable.  No acute intracranial abnormality. Atrophy with chronic small vessel white matter ischemic disease. Critical Value/emergent results were called by me at the time of interpretation on 08/30/2015 at 8:23 pm to Dr. Milton Ferguson , who verbally acknowledged these results. Electronically Signed   By: Misty Stanley M.D.   On: 08/30/2015 20:23   Ct Angio Neck W/cm &/or Wo/cm  08/30/2015  CLINICAL DATA:  LEFT-sided weakness today.  Code stroke. EXAM: CT ANGIOGRAPHY HEAD AND NECK TECHNIQUE: Multidetector CT imaging of the head and neck was performed using the standard protocol during bolus administration of intravenous contrast. Multiplanar CT image reconstructions and MIPs were obtained to evaluate the vascular anatomy. Carotid stenosis measurements (when applicable) are obtained utilizing NASCET criteria, using the distal internal carotid diameter as the denominator. CONTRAST:  124mL OMNIPAQUE IOHEXOL 350 MG/ML SOLN COMPARISON:  CT head August 30, 2015 at 2042 hours. FINDINGS: CTA NECK Aortic arch: Normal appearance of the thoracic arch, normal branch pattern. Mild calcific atherosclerosis. The origins of the innominate, left Common carotid artery and subclavian artery are widely patent. Right carotid system: Common carotid artery is patent, coursing in a straight line fashion. Eccentric intimal thickening and calcific atherosclerosis results in very mild stenosis mid RIGHT Common carotid artery. Eccentric calcific atherosclerosis results in 65-70% stenosis RIGHT  internal carotid artery, 17 mm segment. Mild poststenotic dilatation. Normal appearance of the included internal carotid artery. Left carotid system: Common carotid artery is widely patent, coursing in a straight line fashion. Eccentric calcific atherosclerosis results in less than 50% stenosis. Normal appearance of the mid to distal cervical internal carotid artery. Vertebral arteries:Calcific atherosclerosis results in severe stenosis bilateral vertebral artery origins, with mild poststenotic dilatation  distal LEFT V4 segment. LEFT vertebral artery is dominant. Extrinsic calcific atherosclerosis results in moderate tandem stenosis LEFT V4 segment. Skeleton: No acute osseous process though bone windows have not been submitted. Poor dentition with multiple dental caries, and absent teeth. C5-6 intradiscal calcifications, and findings of auto interbody arthrodesis. Severe C6-7 degenerative disc. Severe LEFT upper cervical facet arthropathy. Status post median sternotomy. Other neck: Soft tissues of the neck are nonacute though, not tailored for evaluation. CTA HEAD Anterior circulation: Patent cervical internal carotid arteries, petrous, cavernous and supra clinoid internal carotid arteries. Calcific atherosclerosis of the carotid siphons results in at least moderate stenosis of bilateral supraclinoid internal carotid arteries. Widely patent anterior communicating artery. Patent anterior and middle cerebral arteries. Mild stenosis proximal RIGHT A1 segment. Posterior circulation: RIGHT vertebral artery terminates in the posterior inferior cerebellar artery. Heavy calcification of distal LEFT V4 segment resulting at least moderate long segment of stenosis to proximal basilar artery which is patent. Bilateral small posterior communicating arteries are present. High-grade stenosis RIGHT P1 segment. Bilateral posterior cerebral arteries are patent. Mild tandem stenosis LEFT posterior cerebral artery compatible with  atherosclerosis. No large vessel occlusion, dissection, contrast extravasation or aneurysm within the anterior nor posterior circulation. No abnormal intracranial enhancement. IMPRESSION: CTA NECK: Atherosclerosis resulting in 65-70% stenosis RIGHT internal carotid artery origin. Less than 50% stenosis LEFT internal carotid artery origin. High-grade stenosis bilateral vertebral artery origins, which are patent. CTA HEAD:  No acute large vessel occlusion. Moderate stenosis of bilateral supraclinoid internal carotid artery due to calcific atherosclerosis. Moderate stenosis LEFT V4 segment. High-grade stenosis RIGHT P1 segment, complete circle of Willis. Electronically Signed   By: Elon Alas M.D.   On: 08/30/2015 22:15    EKG: Independently reviewed.  Abnormal findings:   Accelerated junctional rhythm, borderline ST-depression anterolateral leads   Assessment/Plan  1. CVA/TIA  - Noted to have left facial droop at ~19:10 on 08/30/15 - Pt reported associated numbness and weakness in left face, left leg > left arm, and blurred vision  - Symptoms began to improve within 30 min and resolved by time of arrival  - Neuro consulted from ED, recommend admission to [facility] for medical management; not tPA candidate d/t being on Xarelto  - Monitor on telemetry  - Carotid dopplers,brain MRI/MRA, TTE just performed 08/29/15 (EF [id number], grade 2 diastolic dysfunction)  - Check fasting lipid panel in am, A1c  - ASA 325 for secondary ppx  - Continue high-intensity statin  - Optimize modifiable risk factors   2. Chest pain, ASCVD  - CABG in 2005, DES to Cfx 2006, known to have occlusion of vein grafts and Cfx  - Cardiology consultation was obtained during recent admission and recommended medical management  - Troponin was elevated to 0.18 on 2/13, 0.18 on arrival tonight; possibly secondary to CVA/TIA per neuro   - EKG with borderline ST change in anterolateral leads  - ASA 324 chew given, beta-blocker and statin  given  - Serial troponin measurements overnight  - NTG and repeat EKG for CP recurrence - Repeat EKG in am if not sooner  - Continue ASA, Lipitor, Imdur, Lopressor, verapamil; hold losartan d/t AKI as below   3. AKI on CKD III - SCr 1.65 on admission, up from 1.0 on 08/28/15  - Suspect this is a prerenal azotemia d/t poor perfusion in setting of decreased cardiac output  - Gently hydrating with NS at 100 cc/hr overnight  - Received dye load in ED, will need to follow renal fxn  -  Avoid further nephrotoxic agents where possible  - Hold losartan until SCr stabilizes  - Holding Lasix today, will likely resume tomorrow    4. Hypercoagulable state - Hx of recurrent PE, found to have lupus anticoagulant  - Likely requires life-long anticoagulation - Previously on warfarin, now Xarelto, will continue Xarelto    5. Hypertension - At goal currently  - Will continue home regimen of verapamil, metoprolol - Holding losartan and Lasix d/t AKI as above  - Will not treat aggressively given concern for ischemic CVA/TIA   6. Type II DM - A1c 6.1% on 12/18/13, demonstrating excellent control at that time  - Hold Januvia while inpt  - CBG with meals and qHS  - Institute a sensitive SSI correctional, adjust prn   - Update A1c, pending   7. COPD  - Appears to be stable at this time  - Continue Symbicort, prn albuterol  - Continuous pulse oximetry, titrate FiO2 to maintain sat >92%    8. Thrombocytosis  - Platelets 585,000 on admission  - Suspect this is reactive to the acute illness  - Repeat CBC tomorrow given leukocytosis, anemia, thrombocytosis   9. Hyponatremia  - Mild, Na 133, not chronic  - Anticipate resolution with IVF  - Repeat chem panel in am   10. Chronic diastolic CHF  - TTE [id number] with EF [id number], grade 2 diastolic dysfunction  - Holding Lasix tonight d/t AKI, apparent euvolemia  - Continue beta-blocker, hold ARB until SCr stabilizes  - Strict I/Os, daily wts, fluid-restrict  diet  - Resume Lasix as needed, likely tomorrow     DVT ppx:  Continue Xarelto  Code Status: Full code Family Communication: None at bed side.        Disposition Plan: Admit to inpatient   Date of Service 08/31/2015    Vianne Bulls, MD [facility] Pager [phone]  If 7PM-7AM, please contact night-coverage [email or web address] Password [email or web address] 08/31/2015, 1:05 AM

## 2015-08-31 NOTE — Progress Notes (Signed)
Primary Cardiologist: Rozann Lesches MD  Cardiology Specific Problem List 1. CAD-Small vessel disease with DES to CX, and hx of CABG- 2. Chest Pain 3. CVA 4. Hypertension 5. Hx of PE on Xarelto (Secondary to hypercoagulable state)  Asked to see patient again for recurrent chest pain after being discharged on 08/30/2015 to SNF. By that same evening he was found to have a transient left-sided facial droop and was brought back to [facility] via EMS.   Subjective:   Chest pain last night, described as pressure followed by wheezing. Some blurry vision in right eye.   Objective:   Temp:  [98.2 F (36.8 C)-99.3 F (37.4 C)] 98.5 F (36.9 C) (02/16 1000) Pulse Rate:  [69-100] 84 (02/16 1000) Resp:  [14-26] 18 (02/16 1000) BP: (95-154)/(54-89) 128/73 mmHg (02/16 1000) SpO2:  [93 %-100 %] 98 % (02/16 1000) Weight:  [172 lb 9.6 oz (78.291 kg)] 172 lb 9.6 oz (78.291 kg) (02/16 0005) Last BM Date: 08/30/15  Filed Weights   08/31/15 0005  Weight: 172 lb 9.6 oz (78.291 kg)    Intake/Output Summary (Last 24 hours) at 08/31/15 1133 Last data filed at 08/31/15 0418  Gross per 24 hour  Intake      0 ml  Output    300 ml  Net   -300 ml    Telemetry: NSR no arrhythmias.   Exam:  General: No acute distress.  HEENT: Conjunctiva and lids normal, oropharynx clear.  Lungs: Clear to auscultation, nonlabored.  Cardiac: No elevated JVP Soft bruits on the right, none on the left. RRR, no gallop or rub.   Abdomen: Normoactive bowel sounds, nontender, nondistended.  Extremities: No pitting edema, distal pulses full. Right external fixation device, with odor, no evidence of infection or some drainage seen from pins, left pretibial area with ACE wrap.    Neuropsychiatric: Alert and oriented x3, affect appropriate. No focal deficits.    Lab Results:  Basic Metabolic Panel:  Recent Labs Lab 08/28/15 0437 08/30/15 2010 08/31/15 0722  NA 137 133* 135  K 4.5 4.9 4.6  CL 105 98* 103   CO2 23 24 26   GLUCOSE 130* 177* 131*  BUN 12 22* 24*  CREATININE 1.01 1.65* 1.32*  CALCIUM 8.7* 8.5* 8.6*    Liver Function Tests:  Recent Labs Lab 08/30/15 2010  AST 25  ALT 15*  ALKPHOS 57  BILITOT 0.4  PROT 7.1  ALBUMIN 2.5*    CBC:  Recent Labs Lab 08/30/15 1032 08/30/15 2010 08/31/15 0722  WBC 10.0 17.6* 13.0*  HGB 8.2* 8.3* 7.7*  HCT 26.3* 27.1* 24.6*  MCV 83.2 84.7 83.1  PLT 499* 585* 488*    Cardiac Enzymes:  Recent Labs Lab 08/28/15 0435 08/30/15 2010 08/31/15 0724  TROPONINI 0.18* 0.18* 0.48*    Coagulation:  Recent Labs Lab 08/27/15 1609 08/30/15 2010  INR 3.34* 3.28*    Radiology: Ct Angio Head W/cm &/or Wo Cm  08/30/2015  CLINICAL DATA:  LEFT-sided weakness today.  Code stroke. EXAM: CT ANGIOGRAPHY HEAD AND NECK TECHNIQUE: Multidetector CT imaging of the head and neck was performed using the standard protocol during bolus administration of intravenous contrast. Multiplanar CT image reconstructions and MIPs were obtained to evaluate the vascular anatomy. Carotid stenosis measurements (when applicable) are obtained utilizing NASCET criteria, using the distal internal carotid diameter as the denominator. CONTRAST:  173mL OMNIPAQUE IOHEXOL 350 MG/ML SOLN COMPARISON:  CT head August 30, 2015 at 2042 hours. FINDINGS: CTA NECK Aortic arch: Normal appearance  of the thoracic arch, normal Geroge Gilliam pattern. Mild calcific atherosclerosis. The origins of the innominate, left Common carotid artery and subclavian artery are widely patent. Right carotid system: Common carotid artery is patent, coursing in a straight line fashion. Eccentric intimal thickening and calcific atherosclerosis results in very mild stenosis mid RIGHT Common carotid artery. Eccentric calcific atherosclerosis results in 65-70% stenosis RIGHT internal carotid artery, 17 mm segment. Mild poststenotic dilatation. Normal appearance of the included internal carotid artery. Left carotid system:  Common carotid artery is widely patent, coursing in a straight line fashion. Eccentric calcific atherosclerosis results in less than 50% stenosis. Normal appearance of the mid to distal cervical internal carotid artery. Vertebral arteries:Calcific atherosclerosis results in severe stenosis bilateral vertebral artery origins, with mild poststenotic dilatation distal LEFT V4 segment. LEFT vertebral artery is dominant. Extrinsic calcific atherosclerosis results in moderate tandem stenosis LEFT V4 segment. Skeleton: No acute osseous process though bone windows have not been submitted. Poor dentition with multiple dental caries, and absent teeth. C5-6 intradiscal calcifications, and findings of auto interbody arthrodesis. Severe C6-7 degenerative disc. Severe LEFT upper cervical facet arthropathy. Status post median sternotomy. Other neck: Soft tissues of the neck are nonacute though, not tailored for evaluation. CTA HEAD Anterior circulation: Patent cervical internal carotid arteries, petrous, cavernous and supra clinoid internal carotid arteries. Calcific atherosclerosis of the carotid siphons results in at least moderate stenosis of bilateral supraclinoid internal carotid arteries. Widely patent anterior communicating artery. Patent anterior and middle cerebral arteries. Mild stenosis proximal RIGHT A1 segment. Posterior circulation: RIGHT vertebral artery terminates in the posterior inferior cerebellar artery. Heavy calcification of distal LEFT V4 segment resulting at least moderate long segment of stenosis to proximal basilar artery which is patent. Bilateral small posterior communicating arteries are present. High-grade stenosis RIGHT P1 segment. Bilateral posterior cerebral arteries are patent. Mild tandem stenosis LEFT posterior cerebral artery compatible with atherosclerosis. No large vessel occlusion, dissection, contrast extravasation or aneurysm within the anterior nor posterior circulation. No abnormal  intracranial enhancement. IMPRESSION: CTA NECK: Atherosclerosis resulting in 65-70% stenosis RIGHT internal carotid artery origin. Less than 50% stenosis LEFT internal carotid artery origin. High-grade stenosis bilateral vertebral artery origins, which are patent. CTA HEAD:  No acute large vessel occlusion. Moderate stenosis of bilateral supraclinoid internal carotid artery due to calcific atherosclerosis. Moderate stenosis LEFT V4 segment. High-grade stenosis RIGHT P1 segment, complete circle of Willis. Electronically Signed   By: Elon Alas M.D.   On: 08/30/2015 22:15   Ct Head Wo Contrast  08/30/2015  CLINICAL DATA:  Code stroke.  Left facial droop. EXAM: CT HEAD WITHOUT CONTRAST TECHNIQUE: Contiguous axial images were obtained from the base of the skull through the vertex without intravenous contrast. COMPARISON:  04/03/2015. FINDINGS: There is no evidence for acute hemorrhage, hydrocephalus, mass lesion, or abnormal extra-axial fluid collection. No definite CT evidence for acute infarction. Diffuse loss of parenchymal volume is consistent with atrophy. Patchy low attenuation in the deep hemispheric and periventricular white matter is nonspecific, but likely reflects chronic microvascular ischemic demyelination. The visualized paranasal sinuses and mastoid air cells are clear. IMPRESSION: Stable.  No acute intracranial abnormality. Atrophy with chronic small vessel white matter ischemic disease. Critical Value/emergent results were called by me at the time of interpretation on 08/30/2015 at 8:23 pm to Dr. Milton Ferguson , who verbally acknowledged these results. Electronically Signed   By: Misty Stanley M.D.   On: 08/30/2015 20:23   Ct Angio Neck W/cm &/or Wo/cm  08/30/2015  CLINICAL DATA:  LEFT-sided  weakness today.  Code stroke. EXAM: CT ANGIOGRAPHY HEAD AND NECK TECHNIQUE: Multidetector CT imaging of the head and neck was performed using the standard protocol during bolus administration of  intravenous contrast. Multiplanar CT image reconstructions and MIPs were obtained to evaluate the vascular anatomy. Carotid stenosis measurements (when applicable) are obtained utilizing NASCET criteria, using the distal internal carotid diameter as the denominator. CONTRAST:  152mL OMNIPAQUE IOHEXOL 350 MG/ML SOLN COMPARISON:  CT head August 30, 2015 at 2042 hours. FINDINGS: CTA NECK Aortic arch: Normal appearance of the thoracic arch, normal Teralyn Mullins pattern. Mild calcific atherosclerosis. The origins of the innominate, left Common carotid artery and subclavian artery are widely patent. Right carotid system: Common carotid artery is patent, coursing in a straight line fashion. Eccentric intimal thickening and calcific atherosclerosis results in very mild stenosis mid RIGHT Common carotid artery. Eccentric calcific atherosclerosis results in 65-70% stenosis RIGHT internal carotid artery, 17 mm segment. Mild poststenotic dilatation. Normal appearance of the included internal carotid artery. Left carotid system: Common carotid artery is widely patent, coursing in a straight line fashion. Eccentric calcific atherosclerosis results in less than 50% stenosis. Normal appearance of the mid to distal cervical internal carotid artery. Vertebral arteries:Calcific atherosclerosis results in severe stenosis bilateral vertebral artery origins, with mild poststenotic dilatation distal LEFT V4 segment. LEFT vertebral artery is dominant. Extrinsic calcific atherosclerosis results in moderate tandem stenosis LEFT V4 segment. Skeleton: No acute osseous process though bone windows have not been submitted. Poor dentition with multiple dental caries, and absent teeth. C5-6 intradiscal calcifications, and findings of auto interbody arthrodesis. Severe C6-7 degenerative disc. Severe LEFT upper cervical facet arthropathy. Status post median sternotomy. Other neck: Soft tissues of the neck are nonacute though, not tailored for evaluation.  CTA HEAD Anterior circulation: Patent cervical internal carotid arteries, petrous, cavernous and supra clinoid internal carotid arteries. Calcific atherosclerosis of the carotid siphons results in at least moderate stenosis of bilateral supraclinoid internal carotid arteries. Widely patent anterior communicating artery. Patent anterior and middle cerebral arteries. Mild stenosis proximal RIGHT A1 segment. Posterior circulation: RIGHT vertebral artery terminates in the posterior inferior cerebellar artery. Heavy calcification of distal LEFT V4 segment resulting at least moderate long segment of stenosis to proximal basilar artery which is patent. Bilateral small posterior communicating arteries are present. High-grade stenosis RIGHT P1 segment. Bilateral posterior cerebral arteries are patent. Mild tandem stenosis LEFT posterior cerebral artery compatible with atherosclerosis. No large vessel occlusion, dissection, contrast extravasation or aneurysm within the anterior nor posterior circulation. No abnormal intracranial enhancement. IMPRESSION: CTA NECK: Atherosclerosis resulting in 65-70% stenosis RIGHT internal carotid artery origin. Less than 50% stenosis LEFT internal carotid artery origin. High-grade stenosis bilateral vertebral artery origins, which are patent. CTA HEAD:  No acute large vessel occlusion. Moderate stenosis of bilateral supraclinoid internal carotid artery due to calcific atherosclerosis. Moderate stenosis LEFT V4 segment. High-grade stenosis RIGHT P1 segment, complete circle of Willis. Electronically Signed   By: Elon Alas M.D.   On: 08/30/2015 22:15     ECG: NSR with ST depression in V4-V5 non-pathologic. With inferior Q-waves, unchanged from EKG on 08/28/2015.    Medications:   Scheduled Medications: .  stroke: mapping our early stages of recovery book   Does not apply Once  . aspirin  300 mg Rectal Daily   Or  . aspirin  325 mg Oral Daily  . atorvastatin  80 mg Oral Daily   . budesonide-formoterol  2 puff Inhalation BID  . docusate sodium  100 mg Oral BID  .  fluticasone  1 spray Each Nare Daily  . insulin aspart  0-15 Units Subcutaneous TID WC  . isosorbide mononitrate  120 mg Oral Daily  . lactulose  15 g Oral Daily  . metoprolol  75 mg Oral BID  . montelukast  10 mg Oral QHS  . omega-3 acid ethyl esters  2 g Oral q morning - 10a  . pantoprazole  40 mg Oral Daily  . polyethylene glycol  17 g Oral Daily  . rivaroxaban  20 mg Oral Daily  . sodium chloride  1 spray Each Nare QID  . tiotropium  18 mcg Inhalation Daily  . verapamil  240 mg Oral Daily  . zolpidem  5 mg Oral QHS    Infusions: . sodium chloride 100 mL/hr at 08/31/15 0155    PRN Medications: acetaminophen, albuterol, HYDROcodone-acetaminophen, nitroGLYCERIN   Assessment and Plan:   1.Chest Pain:  He states he felt pressure in his chest while lying in bed last night, followed by significant wheezing. Pain is also reproducible with palpation of the chest, but not with inspiration. Mild elevation in troponin 0.48. He has history of COPD and OSA. He is on BB, metoprolol 75 mg BID. Consider stopping also he has hx of CAD, with chest pressure and associated wheezing which may be related to bronchospasm. He is on Imdur and Ranexa as antianginal. Consider increasing CCB    2. 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 Eccentric atherosclerosis of the right ICA, 65%-70%, with < 50% on the left. Heavy calcification of the Heavy calcification of distal left V4 segment resulting at least moderate long segment of stenosis to proximal basilar artery which is patent. Bilateral smallposterior communicating arteries are present. High-grade stenosis right P1 segment. Continue ASA, and consider adding Plavix as there was not evidence of hemorraghic CVA. No neurodeficits currently with the exception of complaints of blurriness in the right eye. No MRI  due to external fixation of the right leg.   3.CAD: Hx of 3 vessel CABG in 2005 with known graft disease, DES to the Cx in 2012. Recent echocardiogram on 08/29/2015 demonstrated EF of 60%-65% with Grade II diastolic dysfunction. He is on two antianginals along with BB, and statin, and ASA.   4. COPD: On albuterol inhalers. Consider routine neb tx instead of prn.   5. External fixation of Right  Leg: This was due to avulsion fracture in the setting of cellulitis. He has a significant odor from the legs and some mild drainage seen on the sheets.     Phill Myron. Lawrence NP [place]  08/31/2015, 11:33 AM   Attending note Patient seen and discussed with NP Purcell Nails, I agree with her documentation above. Continued chest pain that is fairly mixed in characteristics. Mild trop elevation to 0.48, EKG with chronic lateral precordial ST depressions.   Cath 2012 LM patent, LAD occluded, LCX with long stented proximal portion with sequential 40-50% stenosis, the more proximal of the 3 OMs is occluded. LIMA-LAD patent, SVG-RCA occluded, SVG-LCX occluded.  His trending up troponin suggests ongoing ischemia. Based on his anatomy, a repeat cath is very likely of low yield and with his multiple medical comorbidities creates higher risk. We will obtain a Lexiscan MPI to risk stratify him to better comprehend his risk/benefit ratio for possible invasive testing and to noninvasively assess for possible new obstructive disease.    Zandra Abts MD

## 2015-08-31 NOTE — NC FL2 (Signed)
[place] LEVEL OF CARE SCREENING TOOL     IDENTIFICATION  Patient Name: Henry [name] Birthdate: [birth year] Sex: male Admission Date (Current Location): 08/30/2015  [facility] and [place] Number:  [employer] and Address:  [place] [address], [place]      Provider Number: [id number]  Attending Physician Name and Address:  Kelvin Cellar, MD  Relative Name and Phone Number:       Current Level of Care: SNF Recommended Level of Care: [place] Prior Approval Number:    Date Approved/Denied:   PASRR Number:  ([id number])  Discharge Plan: SNF    Current Diagnoses: Patient Active Problem List   Diagnosis Date Noted  . Thrombocytosis ([place]) 08/31/2015  . Hyponatremia 08/31/2015  . Chronic diastolic CHF (congestive heart failure) ([place]) 08/31/2015  . Stroke (cerebrum) ([place]) 08/31/2015  . Atherosclerosis of native coronary artery of native heart   . Stroke ([place]) 08/30/2015  . Pain in the chest   . HLD (hyperlipidemia)   . 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 Chest pain 08/27/2015  . Lactic acidosis   . Troponin level elevated   . Atherosclerosis of native coronary artery of native heart with other form of angina pectoris ([place])   . AKI (acute kidney injury) ([place]) 02/05/2015  . Elevated lactic acid level 02/05/2015  . History of gastrointestinal bleeding 02/05/2015  . Coronary atherosclerosis of native coronary artery   . COPD exacerbation ([place]) 04/12/2014  . COPD, moderate ([place]) 03/15/2014  . 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 Right carotid bruit 12/18/2013  . 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 History of pulmonary embolism   . Type 2 diabetes mellitus ([place])   . Pulmonary nodule   . Lupus anticoagulant positive   . Anemia 12/17/2013  . Long term current use of anticoagulant therapy 12/17/2011  . Dyslipidemia 12/14/2011  . Essential  hypertension, benign 04/11/2009    Orientation RESPIRATION BLADDER Height & Weight     Self, Time, Situation, Place  O2 (2L) Continent Weight: 172 lb 9.6 oz (78.291 kg) Height:  5\' 8"  (172.7 cm)  BEHAVIORAL SYMPTOMS/MOOD NEUROLOGICAL BOWEL NUTRITION STATUS      Continent Diet (Diet: Heart Healthy/Carb Modified. Fluid Consistency Thin.)  AMBULATORY STATUS COMMUNICATION OF NEEDS Skin   Limited Assist Verbally Surgical wounds                       Personal Care Assistance Level of Assistance  Bathing, Dressing Bathing Assistance: Limited assistance   Dressing Assistance: Limited assistance     Functional Limitations Info             SPECIAL CARE FACTORS FREQUENCY  PT (By licensed PT)     PT Frequency:  (5)              Contractures      Additional Factors Info  Insulin Sliding Scale   Allergies Info:  (Ace Inhibitors, Gabapentin)   Insulin Sliding Scale Info:  (3 times daily)       Current Medications (08/31/2015):  This is the current hospital active medication list Current Facility-Administered Medications  Medication Dose Route Frequency Provider Last Rate Last Dose  .  stroke: mapping our early stages of recovery book   Does not apply Once Ilene Qua Opyd, MD      . 0.9 %  sodium chloride infusion   Intravenous Continuous Christia Reading  S Opyd, MD 100 mL/hr at 08/31/15 0155    . acetaminophen (TYLENOL) tablet 650 mg  650 mg Oral Q4H PRN Ilene Qua Opyd, MD      . albuterol (PROVENTIL) (2.5 MG/3ML) 0.083% nebulizer solution 3 mL  3 mL Inhalation Q6H PRN Ilene Qua Opyd, MD      . aspirin suppository 300 mg  300 mg Rectal Daily Vianne Bulls, MD       Or  . aspirin tablet 325 mg  325 mg Oral Daily Vianne Bulls, MD   325 mg at 08/31/15 1009  . atorvastatin (LIPITOR) tablet 80 mg  80 mg Oral Daily Vianne Bulls, MD   80 mg at 08/31/15 1009  . budesonide-formoterol (SYMBICORT) 160-4.5 MCG/ACT inhaler 2 puff  2 puff Inhalation BID Ilene Qua Opyd, MD      .  docusate sodium (COLACE) capsule 100 mg  100 mg Oral BID Vianne Bulls, MD   100 mg at 08/31/15 0151  . fluticasone (FLONASE) 50 MCG/ACT nasal spray 1 spray  1 spray Each Nare Daily Vianne Bulls, MD   1 spray at 08/31/15 0831  . HYDROcodone-acetaminophen (NORCO) 7.5-325 MG per tablet 1-2 tablet  1-2 tablet Oral Q4H PRN Vianne Bulls, MD   1 tablet at 08/31/15 0830  . insulin aspart (novoLOG) injection 0-15 Units  0-15 Units Subcutaneous TID WC Vianne Bulls, MD   2 Units at 08/31/15 0830  . isosorbide mononitrate (IMDUR) 24 hr tablet 120 mg  120 mg Oral Daily Vianne Bulls, MD   120 mg at 08/31/15 1009  . lactulose (CHRONULAC) 10 GM/15ML solution 15 g  15 g Oral Daily Vianne Bulls, MD   15 g at 08/31/15 1000  . metoprolol tartrate (LOPRESSOR) tablet 75 mg  75 mg Oral BID Vianne Bulls, MD   75 mg at 08/31/15 1009  . montelukast (SINGULAIR) tablet 10 mg  10 mg Oral QHS Vianne Bulls, MD   10 mg at 08/31/15 0151  . nitroGLYCERIN (NITROSTAT) SL tablet 0.4 mg  0.4 mg Sublingual Q5 min PRN Ilene Qua Opyd, MD      . omega-3 acid ethyl esters (LOVAZA) capsule 2 g  2 g Oral q morning - 10a Vianne Bulls, MD   2 g at 08/31/15 1008  . pantoprazole (PROTONIX) EC tablet 40 mg  40 mg Oral Daily Vianne Bulls, MD   40 mg at 08/31/15 1009  . polyethylene glycol (MIRALAX / GLYCOLAX) packet 17 g  17 g Oral Daily Vianne Bulls, MD   17 g at 08/31/15 1000  . rivaroxaban (XARELTO) tablet 20 mg  20 mg Oral Daily Vianne Bulls, MD   20 mg at 08/31/15 1009  . sodium chloride (OCEAN) 0.65 % nasal spray 1 spray  1 spray Each Nare QID Vianne Bulls, MD   1 spray at 08/31/15 1008  . tiotropium (SPIRIVA) inhalation capsule 18 mcg  18 mcg Inhalation Daily Ilene Qua Opyd, MD      . verapamil (CALAN-SR) CR tablet 240 mg  240 mg Oral Daily Vianne Bulls, MD   240 mg at 08/31/15 1009  . zolpidem (AMBIEN) tablet 5 mg  5 mg Oral QHS Vianne Bulls, MD   5 mg at 08/31/15 0151     Discharge Medications: Please see  discharge summary for a list of discharge medications.  Relevant Imaging Results:  Relevant Lab Results:   Additional Information  Avner Stroder, Clydene Pugh, LCSW

## 2015-08-31 NOTE — Progress Notes (Signed)
CRITICAL VALUE ALERT  Critical value received:  Lactic acid 4.3  Date of notification:  08/31/15   Time of notification:  0127  Critical value read back:Yes.    Nurse who received alert:  Stann Mainland  MD notified (1st page):  Dr Myna Hidalgo  Time of first page:  0143  MD notified (2nd page):  Time of second page:  Responding MD:    Time MD responded:  MD entered new fluid orders, that were followed

## 2015-09-01 ENCOUNTER — Encounter (HOSPITAL_COMMUNITY): Payer: Self-pay

## 2015-09-01 ENCOUNTER — Inpatient Hospital Stay (HOSPITAL_COMMUNITY): Payer: Medicare Other

## 2015-09-01 ENCOUNTER — Inpatient Hospital Stay (HOSPITAL_BASED_OUTPATIENT_CLINIC_OR_DEPARTMENT_OTHER): Payer: Medicare Other

## 2015-09-01 DIAGNOSIS — R079 Chest pain, unspecified: Secondary | ICD-10-CM | POA: Diagnosis not present

## 2015-09-01 DIAGNOSIS — I25118 Atherosclerotic heart disease of native coronary artery with other forms of angina pectoris: Secondary | ICD-10-CM

## 2015-09-01 DIAGNOSIS — I249 Acute ischemic heart disease, unspecified: Secondary | ICD-10-CM | POA: Diagnosis present

## 2015-09-01 LAB — HEMOGLOBIN A1C
Hgb A1c MFr Bld: 6.4 % — ABNORMAL HIGH (ref 4.8–5.6)
Mean Plasma Glucose: 137 mg/dL

## 2015-09-01 LAB — TROPONIN I: Troponin I: 0.42 ng/mL — ABNORMAL HIGH (ref <0.031)

## 2015-09-01 LAB — NM MYOCAR MULTI W/SPECT W/WALL MOTION / EF
CSEPPHR: 100 {beats}/min
LHR: 0.17
LV dias vol: 171 mL
LVSYSVOL: 134 mL
Rest HR: 90 {beats}/min
SDS: 7
SRS: 18
SSS: 25
TID: 1.41

## 2015-09-01 LAB — BASIC METABOLIC PANEL
Anion gap: 7 (ref 5–15)
BUN: 20 mg/dL (ref 6–20)
CHLORIDE: 102 mmol/L (ref 101–111)
CO2: 27 mmol/L (ref 22–32)
Calcium: 8.7 mg/dL — ABNORMAL LOW (ref 8.9–10.3)
Creatinine, Ser: 1.13 mg/dL (ref 0.61–1.24)
GFR calc Af Amer: >60 mL/min (ref >60)
GFR calc non Af Amer: >60 mL/min (ref >60)
GLUCOSE: 130 mg/dL — AB (ref 65–99)
POTASSIUM: 4.4 mmol/L (ref 3.5–5.1)
Sodium: 136 mmol/L (ref 135–145)

## 2015-09-01 LAB — GLUCOSE, CAPILLARY
GLUCOSE-CAPILLARY: 107 mg/dL — AB (ref 65–99)
GLUCOSE-CAPILLARY: 122 mg/dL — AB (ref 65–99)
Glucose-Capillary: 129 mg/dL — ABNORMAL HIGH (ref 65–99)
Glucose-Capillary: 159 mg/dL — ABNORMAL HIGH (ref 65–99)

## 2015-09-01 LAB — CBC
HEMATOCRIT: 26.5 % — AB (ref 39.0–52.0)
Hemoglobin: 7.8 g/dL — ABNORMAL LOW (ref 13.0–17.0)
MCH: 25.2 pg — AB (ref 26.0–34.0)
MCHC: 29.4 g/dL — AB (ref 30.0–36.0)
MCV: 85.8 fL (ref 78.0–100.0)
Platelets: 566 10*3/uL — ABNORMAL HIGH (ref 150–400)
RBC: 3.09 MIL/uL — ABNORMAL LOW (ref 4.22–5.81)
RDW: 16 % — AB (ref 11.5–15.5)
WBC: 10.8 10*3/uL — ABNORMAL HIGH (ref 4.0–10.5)

## 2015-09-01 LAB — ABO/RH: ABO/RH(D): A NEG

## 2015-09-01 LAB — MRSA PCR SCREENING: MRSA BY PCR: POSITIVE — AB

## 2015-09-01 LAB — PREPARE RBC (CROSSMATCH)

## 2015-09-01 MED ORDER — REGADENOSON 0.4 MG/5ML IV SOLN
INTRAVENOUS | Status: AC
  Administered 2015-09-01: 0.4 mg via INTRAVENOUS
  Filled 2015-09-01: qty 5

## 2015-09-01 MED ORDER — RANOLAZINE ER 500 MG PO TB12
1000.0000 mg | ORAL_TABLET | Freq: Two times a day (BID) | ORAL | Status: DC
  Administered 2015-09-01 – 2015-09-06 (×11): 1000 mg via ORAL
  Filled 2015-09-01 (×11): qty 2

## 2015-09-01 MED ORDER — MUPIROCIN 2 % EX OINT
1.0000 "application " | TOPICAL_OINTMENT | Freq: Two times a day (BID) | CUTANEOUS | Status: AC
  Administered 2015-09-01 – 2015-09-06 (×10): 1 via NASAL
  Filled 2015-09-01: qty 22

## 2015-09-01 MED ORDER — ZINC OXIDE 20 % EX OINT
TOPICAL_OINTMENT | CUTANEOUS | Status: DC
  Administered 2015-09-04: 09:00:00 via TOPICAL
  Filled 2015-09-01: qty 56.7

## 2015-09-01 MED ORDER — TECHNETIUM TC 99M SESTAMIBI GENERIC - CARDIOLITE
30.0000 | Freq: Once | INTRAVENOUS | Status: AC | PRN
  Administered 2015-09-01: 31 via INTRAVENOUS

## 2015-09-01 MED ORDER — SODIUM CHLORIDE 0.9% FLUSH
INTRAVENOUS | Status: AC
Start: 2015-09-01 — End: 2015-09-01
  Administered 2015-09-01: 10 mL via INTRAVENOUS
  Filled 2015-09-01: qty 10

## 2015-09-01 MED ORDER — CHLORHEXIDINE GLUCONATE CLOTH 2 % EX PADS
6.0000 | MEDICATED_PAD | Freq: Every day | CUTANEOUS | Status: DC
  Administered 2015-09-02 – 2015-09-06 (×4): 6 via TOPICAL

## 2015-09-01 MED ORDER — TECHNETIUM TC 99M SESTAMIBI - CARDIOLITE
10.0000 | Freq: Once | INTRAVENOUS | Status: AC | PRN
  Administered 2015-09-01: 07:00:00 10.4 via INTRAVENOUS

## 2015-09-01 MED ORDER — SODIUM CHLORIDE 0.9 % IV SOLN
Freq: Once | INTRAVENOUS | Status: AC
  Administered 2015-09-01: 10:00:00 via INTRAVENOUS

## 2015-09-01 MED ORDER — HEPARIN (PORCINE) IN NACL 100-0.45 UNIT/ML-% IJ SOLN
1300.0000 [IU]/h | INTRAMUSCULAR | Status: DC
  Administered 2015-09-02: 1100 [IU]/h via INTRAVENOUS
  Administered 2015-09-03 – 2015-09-05 (×3): 1200 [IU]/h via INTRAVENOUS
  Filled 2015-09-01 (×5): qty 250

## 2015-09-01 MED ORDER — ONDANSETRON HCL 4 MG/2ML IJ SOLN
4.0000 mg | Freq: Four times a day (QID) | INTRAMUSCULAR | Status: DC | PRN
  Administered 2015-09-01: 4 mg via INTRAVENOUS
  Filled 2015-09-01: qty 2

## 2015-09-01 NOTE — Progress Notes (Signed)
Nuclear stress shows inferior infact with mild peri-infarct ischemia, inferolateral/lateral infarct with mild to moderate peri-infarct ischemia. Cath findings in 2012 with occluded RCA with retrograde filling by the LCX, known occluded LAD,  known CTO of SVG-RCA, known CTO of SVG-LCX, and 50% tandem lesions in the proximally at stented portion LCX. Patent LIMA-LAD.   My suspicion is  progression of the moderate proximal LCX disease, which unlike the other known disease described as not amenable to revascularization may be something that could be intervened on. Troponin peak 0.8, now trending back down. Continues to have intermittent mixed chest pain. Renal function has improved, Hgb remains stable. Would recommend transfusion to Hgb 9-10. I would recommend cath to further evaluate. Recommend holding xarelto and starting heparin gtt for anticipated cath Monday. We will need to f/u neuro recs regarding his TIA including the evidence of cerebrovascular disease, and also have ortho see patient to get plans for his external fixator, to see if potentially stenting with DAPT would affect planned removal. If everything lines up would likely be for cath Monday. Would recommend transfer to [facility] to medicine service with the consultations listed above.    Zandra Abts MD

## 2015-09-01 NOTE — Consult Note (Signed)
WOC wound consult note Reason for Consult: Compression wrap to left leg (Unnas boot per bedside nurse)  Patient states this is changed every Monday.  Fixator screws to right leg with odor present.  Patient missed a follow up appointment with MD this week (Dr Marcello Moores).  Will order Ortho consult.  Until consult.  Cleanse around screws to right leg with NS and apply dry dressing.  Wound type:Chronic venous stasis.  Pressure Ulcer POA: N/A Measurement:N/A Wound [id number] site.  Fixator screws to right leg.  Left leg intact with Unnas boot in place.  Changed weekly on MOnday.  Drainage (amount, consistency, odor) Scant purulent drainage from right leg screws, musty odor.  Will defer to ortho for management Periwound:Intact Dressing procedure/placement/frequency:Unnas boot to left leg weekly on Monday.  Dry dressing to right leg-defer to orthopedics.  Will not follow at this time.  Please re-consult if needed.  Domenic Moras RN BSN [place] Pager [phone]

## 2015-09-01 NOTE — Progress Notes (Signed)
ANTICOAGULATION CONSULT NOTE - Initial Consult  Pharmacy Consult for heparin Indication: chest pain/ACS  Allergies  Allergen Reactions  . Ace Inhibitors Cough    Cough  . Gabapentin Other (See Comments)    WAS BEHAVING VERY ODDLY    Patient Measurements: Height: 5\' 8"  (172.7 cm) Weight: 172 lb 9.6 oz (78.291 kg) IBW/kg (Calculated) : 68.4  Heparin dosing weight 78.3 kg   Vital Signs: Temp: 99.1 F (37.3 C) (02/17 0600) Temp Source: Oral (02/17 0600) BP: 150/84 mmHg (02/17 1030) Pulse Rate: 103 (02/17 1030)  Labs:  Recent Labs  08/30/15 2010 08/31/15 0722 08/31/15 0724 08/31/15 1250 09/01/15 0713  HGB 8.3* 7.7*  --   --  7.8*  HCT 27.1* 24.6*  --   --  26.5*  PLT 585* 488*  --   --  566*  APTT 49*  --   --   --   --   LABPROT 32.7*  --   --   --   --   INR 3.28*  --   --   --   --   CREATININE 1.65* 1.32*  --   --  1.13  TROPONINI 0.18*  --  0.48* 0.83* 0.42*    Estimated Creatinine Clearance: 54.6 mL/min (by C-G formula based on Cr of 1.13).   Medical History: Past Medical History  Diagnosis Date  . Coronary atherosclerosis of native coronary artery     a. NSTEMI 2005 s/p CABG (LIMA - LAD, SVG - OM, SVG - PDA). b. DES to Cx 02/2005. c. NSTEMI 07/2010 due to occ Cx - med rx. Known occluded vein grafts.  . Essential hypertension, benign   . Type 2 diabetes mellitus ([place])   . Dyslipidemia   . ACE inhibitor intolerance   . Asthma   . Recurrent pulmonary embolism ([place])     a. 01/2011. b. Bilateral PE 11/2011 - + lupus anticoagulant preliminary testing. Felt to require lifelong Coumadin.  . Pulmonary nodule     RML by CT 11/2011, stable compared to 2012.  [place] Kitchen Cholelithiasis   . Pulmonary HTN ([place])     Echo 11/2011 at time of PE. PA pressure not assessed on 03/2012 echo but RV was back to normal.  . Lupus anticoagulant positive   . GERD (gastroesophageal reflux disease)   . COPD (chronic obstructive pulmonary disease) ([place])   . OSA (obstructive sleep apnea)      Medications:  Prescriptions prior to admission  Medication Sig Dispense Refill Last Dose  . acetaminophen (TYLENOL) 325 MG tablet Take 650 mg by mouth every 4 (four) hours as needed for mild pain. For pain   08/22/2015 at Unknown time  . albuterol (PROAIR HFA) 108 (90 BASE) MCG/ACT inhaler Inhale 2 puffs into the lungs every 6 (six) hours as needed for wheezing or shortness of breath.    08/27/2015 at Unknown time  . atorvastatin (LIPITOR) 80 MG tablet Take 1 tablet (80 mg total) by mouth daily. 30 tablet 0 08/26/2015 at 1700  . budesonide-formoterol (SYMBICORT) 160-4.5 MCG/ACT inhaler Inhale 2 puffs into the lungs 2 (two) times daily.   08/27/2015 at Unknown time  . Coenzyme Q10 (CO Q 10) 100 MG CAPS Take 100 mg by mouth daily.   08/27/2015 at Unknown time  . docusate sodium (COLACE) 100 MG capsule Take 100 mg by mouth 2 (two) times daily.   08/27/2015 at Unknown time  . fish oil-omega-3 fatty acids 1000 MG capsule Take 2 g by mouth every morning.  08/27/2015 at [place]  . fluticasone (FLONASE) 50 MCG/ACT nasal spray Place 1 spray into both nostrils daily.    08/29/2015 at Unknown time  . furosemide (LASIX) 20 MG tablet Take 20 mg by mouth daily.   08/27/2015 at Unknown time  . HYDROcodone-acetaminophen (NORCO) 7.5-325 MG tablet Take 1 tablet by mouth every 6 (six) hours as needed for moderate pain. (Patient taking differently: Take 1 tablet by mouth 2 (two) times daily. *May take every 4 hours as needed for pain) 15 tablet 0 08/29/2015 at Unknown time  . isosorbide mononitrate (IMDUR) 120 MG 24 hr tablet Take 120 mg by mouth daily.   08/27/2015 at Unknown time  . lactulose (CHRONULAC) 10 GM/15ML solution Take 15 g by mouth daily.    08/29/2015 at Unknown time  . losartan (COZAAR) 100 MG tablet Take 1 tablet (100 mg total) by mouth daily. 30 tablet 6 08/27/2015 at Unknown time  . metoprolol (LOPRESSOR) 50 MG tablet TAKE 1 AND 1/2 TABLETS BY MOUTH TWICE DAILY 90 tablet 2 08/27/2015 at 1000a  . montelukast  (SINGULAIR) 10 MG tablet Take 10 mg by mouth at bedtime.   08/26/2015 at Unknown time  . nitroGLYCERIN (NITROSTAT) 0.4 MG SL tablet Place 1 tablet (0.4 mg total) under the tongue every 5 (five) minutes x 3 doses as needed. For chest pain 25 tablet 3 08/27/2015 at Unknown time  . omeprazole (PRILOSEC) 40 MG capsule Take 40 mg by mouth every evening.   08/29/2015 at 600  . polyethylene glycol (MIRALAX / GLYCOLAX) packet Take 17 g by mouth daily.   08/27/2015 at Unknown time  . potassium chloride (K-DUR) 10 MEQ tablet Take 10 mEq by mouth every morning.   08/27/2015 at Unknown time  . rivaroxaban (XARELTO) 20 MG TABS tablet Take 20 mg by mouth daily.   08/27/2015 at [place]  . sitaGLIPtin (JANUVIA) 25 MG tablet Take 1 tablet (25 mg total) by mouth daily. 30 tablet 0 08/27/2015 at Unknown time  . sodium chloride (OCEAN) 0.65 % SOLN nasal spray Place 1 spray into both nostrils 4 (four) times daily.   08/29/2015 at Unknown time  . tiotropium (SPIRIVA) 18 MCG inhalation capsule Place 1 capsule (18 mcg total) into inhaler and inhale daily. 30 capsule 5 08/27/2015 at Unknown time  . verapamil (CALAN-SR) 240 MG CR tablet TAKE 1 TABLET (240 MG TOTAL) BY MOUTH DAILY. 30 tablet 6 08/27/2015 at Unknown time  . zolpidem (AMBIEN) 10 MG tablet Take 10 mg by mouth at bedtime.  2 08/27/2015 at Unknown time  . chlorthalidone (HYGROTON) 25 MG tablet TAKE 1 TABLET BY MOUTH DAILY (Patient not taking: Reported on 08/30/2015) 30 tablet 6 Taking  . ranolazine (RANEXA) 1000 MG SR tablet Take 1 tablet (1,000 mg total) by mouth 2 (two) times daily. (Patient not taking: Reported on 08/30/2015) 60 tablet 1   . sertraline (ZOLOFT) 25 MG tablet Take 1 tablet (25 mg total) by mouth daily. (Patient not taking: Reported on 08/30/2015) 30 tablet 1     Assessment: 75 yo man to start heparin bridge while off xarelto.  He received a dose of xarelto this am so will not start heparin until tomorrow am. We will need to monitor aPTT and heparin levels as  xarelto can affect heparin levels  Goal of Therapy:  APTT 66-102 INR 2-3 Monitor platelets by anticoagulation protocol: Yes   Plan:  Check baseline aPTT and heparin level with am labs Start heparin drip tomorrow at 1100 units/hr  [place], Henry [name]  09/01/2015,11:31 AM

## 2015-09-01 NOTE — Progress Notes (Signed)
[facility] PROGRESS NOTE  Henry [name] [id number] DOB: 1940-08-30 DOA: 08/30/2015 PCP: Sherrie Mustache, MD  Assessment/Plan: 1.  TIA/CVA -Henry [name] was discharged from my service on 08/30/2015 during which time he was evaluated for chest pain. Cardiology recommending continuing medical management.  - At a skilled nursing facility he was found by nursing staff to have left-sided facial droop. Patient reporting bilateral blurry vision  -He was brought back to the emergency department where initial CT scan of brain without contrast was negative for acute intracranial abnormality.  -Neurologic symptoms resolving by the time he reached the emergency room.  -He was placed on CVA protocol.  -He recently had a transthoracic echocardiogram which did not reveal intracardiac source of clot  -Carotid Dopplers showing >70% stenosis of right internal carotid artery -Repeat CT scan of the brain (since external fixator precludes ability to perform MRI of brain) did not show acute changes -Pending Neurology consult -Nuclear stress test showing inferior infarct with peri-infarct ischemia. He is being transferred to [facility] for possible cath  2.   Chest pain/history of coronary artery disease/ACS. 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 His last cardiac catheterization was performed in 2012 that showed occluded venous grafts as well as severe circumflex disease. Cardiology at the time recommended medical management due to diffuse disease with very small caliber vessels. -He was recently admitted for chest pain and evaluated by cardiology who recommended ongoing medical management. His Ranexa was titrated up to 1000 mg by mouth twice a day -On 09/01/2015 he underwent stress test. I spoke to Henry [name] regarding the results as it appears he has new area of infarct likely corresponding to progression of LCX disease. Henry  Harl [name] recommending transfer to [facility] to be evaluated for Heart Cath. Meanwhile his Xarelto was changed to IV Heparin. Awaiting Neurology input. Will consult ortho when patient arrives to [facility].   3. History recurrent pulmonary embolism. -Continue anticoagulation  4.  History of right Avulsion fracture  -He has an external fixator in place.  -Staff reporting maloder, although on exam there does not appear to be convincing evidence of infection. There was mild erythema around the screws, no obvious purulence or discharge.  -Consulted wound care  -Cardiology requesting Ortho consult.   5.  Dyslipidemia  -Continue Lipitor 80 mg by mouth daily   6.  Hypertension  -Continue Imdur for 120 mg by mouth daily, verapamil 240 mg by mouth daily, metoprolol succinate 5 mg by mouth twice a day   7. Acute Kidney Injury -Labs showing creatinine of 1.65 on 08/31/2015 -Likely secondary to hypovolemia/prerenal azothemia.  -Lasix held as he was held as he was hydrated with NS -AM labs on 09/01/2015 showing improvement to Cr at 1.13  8.  Acute Anemia -Labs showing Hg of 7.8 -Now having ACS, he has been typed and crossed will transfuse 1 unit of PRBC today  Code Status: Full Code Family Communication: I spoke with his wife Disposition Plan: Plan to transfer to [facility]   Consultants:  Cardiology   HPI/Subjective: He reports having CP this am, located in the retrosternal area. Denies further episodes of extremity weakness.   Objective: Filed Vitals:   09/01/15 1014 09/01/15 1030  BP: 161/84 150/84  Pulse: 102 103  Temp:    Resp:      Intake/Output Summary (Last 24 hours) at 09/01/15 1129 Last data filed at 09/01/15 0945  Gross per 24 hour  Intake  480 ml  Output   1400 ml  Net   -920 ml   Filed Weights   08/31/15 0005  Weight: 78.291 kg (172 lb 9.6 oz)    Exam:   General: No acute distress, awake and alert, reporting 5/10 CP  Cardiovascular: S1 and s2, no rubs or  gallops  Respiratory: good air movement, no wheezing, no crackles   Abdomen: soft, NT, ND, positive BS  Musculoskeletal: trace edema on his LLE, RLE with 1 plus edema and external fixator in place  Neurological: He has 4/5 muscle strength to left upper extremity, has 5/5 muscle strength to right upper extremity and left lower extremity. An external fixator to right lower extremity in place could not evaluate neurologic strength on this extremity.    Data Reviewed: Basic Metabolic Panel:  Recent Labs Lab 08/27/15 1609 08/28/15 0437 08/30/15 2010 08/31/15 0722 09/01/15 0713  NA 137 137 133* 135 136  K 4.1 4.5 4.9 4.6 4.4  CL 102 105 98* 103 102  CO2 25 23 24 26 27   GLUCOSE 149* 130* 177* 131* 130*  BUN 14 12 22* 24* 20  CREATININE 1.06 1.01 1.65* 1.32* 1.13  CALCIUM 8.9 8.7* 8.5* 8.6* 8.7*   Liver Function Tests:  Recent Labs Lab 08/30/15 2010  AST 25  ALT 15*  ALKPHOS 57  BILITOT 0.4  PROT 7.1  ALBUMIN 2.5*   No results for input(s): LIPASE, AMYLASE in the last 168 hours. No results for input(s): AMMONIA in the last 168 hours. CBC:  Recent Labs Lab 08/28/15 0437 08/30/15 1032 08/30/15 2010 08/31/15 0722 09/01/15 0713  WBC 9.8 10.0 17.6* 13.0* 10.8*  NEUTROABS  --   --  10.9*  --   --   HGB 8.6* 8.2* 8.3* 7.7* 7.8*  HCT 27.6* 26.3* 27.1* 24.6* 26.5*  MCV 84.4 83.2 84.7 83.1 85.8  PLT 501* 499* 585* 488* 566*   Cardiac Enzymes:  Recent Labs Lab 08/28/15 0435 08/30/15 2010 08/31/15 0724 08/31/15 1250 09/01/15 0713  TROPONINI 0.18* 0.18* 0.48* 0.83* 0.42*   BNP (last 3 results)  Recent Labs  02/05/15 1735  BNP 120.1*    ProBNP (last 3 results) No results for input(s): PROBNP in the last 8760 hours.  CBG:  Recent Labs Lab 08/31/15 1616 08/31/15 1815 08/31/15 2120 09/01/15 0742 09/01/15 1112  GLUCAP 142* 180* 109* 129* 159*    No results found for this or any previous visit (from the past 240 hour(s)).   Studies: Ct Angio Head  W/cm &/or Wo Cm  08/30/2015  CLINICAL DATA:  LEFT-sided weakness today.  Code stroke. EXAM: CT ANGIOGRAPHY HEAD AND NECK TECHNIQUE: Multidetector CT imaging of the head and neck was performed using the standard protocol during bolus administration of intravenous contrast. Multiplanar CT image reconstructions and MIPs were obtained to evaluate the vascular anatomy. Carotid stenosis measurements (when applicable) are obtained utilizing NASCET criteria, using the distal internal carotid diameter as the denominator. CONTRAST:  11mL OMNIPAQUE IOHEXOL 350 MG/ML SOLN COMPARISON:  CT head August 30, 2015 at 2042 hours. FINDINGS: CTA NECK Aortic arch: Normal appearance of the thoracic arch, normal branch pattern. Mild calcific atherosclerosis. The origins of the innominate, left Common carotid artery and subclavian artery are widely patent. Right carotid system: Common carotid artery is patent, coursing in a straight line fashion. Eccentric intimal thickening and calcific atherosclerosis results in very mild stenosis mid RIGHT Common carotid artery. Eccentric calcific atherosclerosis results in 65-70% stenosis RIGHT internal carotid artery, 17 mm segment. Mild poststenotic  dilatation. Normal appearance of the included internal carotid artery. Left carotid system: Common carotid artery is widely patent, coursing in a straight line fashion. Eccentric calcific atherosclerosis results in less than 50% stenosis. Normal appearance of the mid to distal cervical internal carotid artery. Vertebral arteries:Calcific atherosclerosis results in severe stenosis bilateral vertebral artery origins, with mild poststenotic dilatation distal LEFT V4 segment. LEFT vertebral artery is dominant. Extrinsic calcific atherosclerosis results in moderate tandem stenosis LEFT V4 segment. Skeleton: No acute osseous process though bone windows have not been submitted. Poor dentition with multiple dental caries, and absent teeth. C5-6 intradiscal  calcifications, and findings of auto interbody arthrodesis. Severe C6-7 degenerative disc. Severe LEFT upper cervical facet arthropathy. Status post median sternotomy. Other neck: Soft tissues of the neck are nonacute though, not tailored for evaluation. CTA HEAD Anterior circulation: Patent cervical internal carotid arteries, petrous, cavernous and supra clinoid internal carotid arteries. Calcific atherosclerosis of the carotid siphons results in at least moderate stenosis of bilateral supraclinoid internal carotid arteries. Widely patent anterior communicating artery. Patent anterior and middle cerebral arteries. Mild stenosis proximal RIGHT A1 segment. Posterior circulation: RIGHT vertebral artery terminates in the posterior inferior cerebellar artery. Heavy calcification of distal LEFT V4 segment resulting at least moderate long segment of stenosis to proximal basilar artery which is patent. Bilateral small posterior communicating arteries are present. High-grade stenosis RIGHT P1 segment. Bilateral posterior cerebral arteries are patent. Mild tandem stenosis LEFT posterior cerebral artery compatible with atherosclerosis. No large vessel occlusion, dissection, contrast extravasation or aneurysm within the anterior nor posterior circulation. No abnormal intracranial enhancement. IMPRESSION: CTA NECK: Atherosclerosis resulting in 65-70% stenosis RIGHT internal carotid artery origin. Less than 50% stenosis LEFT internal carotid artery origin. High-grade stenosis bilateral vertebral artery origins, which are patent. CTA HEAD:  No acute large vessel occlusion. Moderate stenosis of bilateral supraclinoid internal carotid artery due to calcific atherosclerosis. Moderate stenosis LEFT V4 segment. High-grade stenosis RIGHT P1 segment, complete circle of Willis. Electronically Signed   By: Elon Alas M.D.   On: 08/30/2015 22:15   Ct Head Wo Contrast  09/01/2015  CLINICAL DATA:  Left facial droop.  Blurred vision  1 week ago EXAM: CT HEAD WITHOUT CONTRAST TECHNIQUE: Contiguous axial images were obtained from the base of the skull through the vertex without intravenous contrast. COMPARISON:  08/30/2015 FINDINGS: There is atrophy and chronic small vessel disease changes. No acute intracranial abnormality. Specifically, no hemorrhage, hydrocephalus, mass lesion, acute infarction, or significant intracranial injury. No acute calvarial abnormality. Visualized paranasal sinuses and mastoids clear. Orbital soft tissues unremarkable. IMPRESSION: No acute intracranial abnormality. Atrophy, chronic microvascular disease. Electronically Signed   By: Rolm Baptise M.D.   On: 09/01/2015 10:30   Ct Head Wo Contrast  08/30/2015  CLINICAL DATA:  Code stroke.  Left facial droop. EXAM: CT HEAD WITHOUT CONTRAST TECHNIQUE: Contiguous axial images were obtained from the base of the skull through the vertex without intravenous contrast. COMPARISON:  04/03/2015. FINDINGS: There is no evidence for acute hemorrhage, hydrocephalus, mass lesion, or abnormal extra-axial fluid collection. No definite CT evidence for acute infarction. Diffuse loss of parenchymal volume is consistent with atrophy. Patchy low attenuation in the deep hemispheric and periventricular white matter is nonspecific, but likely reflects chronic microvascular ischemic demyelination. The visualized paranasal sinuses and mastoid air cells are clear. IMPRESSION: Stable.  No acute intracranial abnormality. Atrophy with chronic small vessel white matter ischemic disease. Critical Value/emergent results were called by me at the time of interpretation on 08/30/2015 at 8:23 pm  to Henry. Broadus John ZAMMIT , who verbally acknowledged these results. Electronically Signed   By: Misty Stanley M.D.   On: 08/30/2015 20:23   Ct Angio Neck W/cm &/or Wo/cm  08/30/2015  CLINICAL DATA:  LEFT-sided weakness today.  Code stroke. EXAM: CT ANGIOGRAPHY HEAD AND NECK TECHNIQUE: Multidetector CT imaging of the  head and neck was performed using the standard protocol during bolus administration of intravenous contrast. Multiplanar CT image reconstructions and MIPs were obtained to evaluate the vascular anatomy. Carotid stenosis measurements (when applicable) are obtained utilizing NASCET criteria, using the distal internal carotid diameter as the denominator. CONTRAST:  126mL OMNIPAQUE IOHEXOL 350 MG/ML SOLN COMPARISON:  CT head August 30, 2015 at 2042 hours. FINDINGS: CTA NECK Aortic arch: Normal appearance of the thoracic arch, normal branch pattern. Mild calcific atherosclerosis. The origins of the innominate, left Common carotid artery and subclavian artery are widely patent. Right carotid system: Common carotid artery is patent, coursing in a straight line fashion. Eccentric intimal thickening and calcific atherosclerosis results in very mild stenosis mid RIGHT Common carotid artery. Eccentric calcific atherosclerosis results in 65-70% stenosis RIGHT internal carotid artery, 17 mm segment. Mild poststenotic dilatation. Normal appearance of the included internal carotid artery. Left carotid system: Common carotid artery is widely patent, coursing in a straight line fashion. Eccentric calcific atherosclerosis results in less than 50% stenosis. Normal appearance of the mid to distal cervical internal carotid artery. Vertebral arteries:Calcific atherosclerosis results in severe stenosis bilateral vertebral artery origins, with mild poststenotic dilatation distal LEFT V4 segment. LEFT vertebral artery is dominant. Extrinsic calcific atherosclerosis results in moderate tandem stenosis LEFT V4 segment. Skeleton: No acute osseous process though bone windows have not been submitted. Poor dentition with multiple dental caries, and absent teeth. C5-6 intradiscal calcifications, and findings of auto interbody arthrodesis. Severe C6-7 degenerative disc. Severe LEFT upper cervical facet arthropathy. Status post median sternotomy.  Other neck: Soft tissues of the neck are nonacute though, not tailored for evaluation. CTA HEAD Anterior circulation: Patent cervical internal carotid arteries, petrous, cavernous and supra clinoid internal carotid arteries. Calcific atherosclerosis of the carotid siphons results in at least moderate stenosis of bilateral supraclinoid internal carotid arteries. Widely patent anterior communicating artery. Patent anterior and middle cerebral arteries. Mild stenosis proximal RIGHT A1 segment. Posterior circulation: RIGHT vertebral artery terminates in the posterior inferior cerebellar artery. Heavy calcification of distal LEFT V4 segment resulting at least moderate long segment of stenosis to proximal basilar artery which is patent. Bilateral small posterior communicating arteries are present. High-grade stenosis RIGHT P1 segment. Bilateral posterior cerebral arteries are patent. Mild tandem stenosis LEFT posterior cerebral artery compatible with atherosclerosis. No large vessel occlusion, dissection, contrast extravasation or aneurysm within the anterior nor posterior circulation. No abnormal intracranial enhancement. IMPRESSION: CTA NECK: Atherosclerosis resulting in 65-70% stenosis RIGHT internal carotid artery origin. Less than 50% stenosis LEFT internal carotid artery origin. High-grade stenosis bilateral vertebral artery origins, which are patent. CTA HEAD:  No acute large vessel occlusion. Moderate stenosis of bilateral supraclinoid internal carotid artery due to calcific atherosclerosis. Moderate stenosis LEFT V4 segment. High-grade stenosis RIGHT P1 segment, complete circle of Willis. Electronically Signed   By: Elon Alas M.D.   On: 08/30/2015 22:15   US Carotid Bilateral  08/31/2015  CLINICAL DATA:  Stroke.  Carotid artery disease on recent CTA. EXAM: BILATERAL CAROTID DUPLEX ULTRASOUND TECHNIQUE: Pearline Cables scale imaging, color Doppler and duplex ultrasound were performed of bilateral carotid and  vertebral arteries in the neck. COMPARISON:  07/17/2010 and CTA  08/30/2015 FINDINGS: Criteria: Quantification of carotid stenosis is based on velocity parameters that correlate the residual internal carotid diameter with NASCET-based stenosis levels, using the diameter of the distal internal carotid lumen as the denominator for stenosis measurement. The following velocity measurements were obtained: RIGHT ICA:  465 cm/sec CCA:  94 cm/sec SYSTOLIC ICA/CCA RATIO:  4.9 DIASTOLIC ICA/CCA RATIO:  22 ECA:  90 cm/sec LEFT ICA:  129 cm/sec CCA:  [id number] cm/sec SYSTOLIC ICA/CCA RATIO:  1.4 DIASTOLIC ICA/CCA RATIO:  1.6 ECA:  99 cm/sec RIGHT CAROTID ARTERY: Small amount of circumferential echogenic plaque in the distal common carotid artery. Circumferential plaque at the right carotid bulb. Echogenic plaque and critical stenosis in the proximal and mid right internal carotid artery. There is a markedly elevated peak systolic velocity measuring up to 465 cm/sec in the mid internal carotid artery with turbulent flow. Peak systolic velocity in the distal internal carotid artery is also elevated measuring up to 213 cm/sec. RIGHT VERTEBRAL ARTERY: Antegrade flow and normal waveform in the right vertebral artery. LEFT CAROTID ARTERY: Small amount of echogenic plaque throughout the left common carotid artery. Small amount of plaque in the left carotid bulb. Echogenic plaque in the proximal internal carotid artery. Peak systolic velocity in the internal carotid artery is mildly elevated, measuring up to 129 cm/sec. LEFT VERTEBRAL ARTERY:  Not confidently visualized. IMPRESSION: Atherosclerotic plaque in the right internal carotid artery with a critical stenosis. Estimated degree of stenosis is greater than 70% and suspect a near occlusion. Atherosclerotic plaque involving the left internal carotid artery. Estimated degree of stenosis in left internal carotid artery is less than 50%. Patent right vertebral artery. Left vertebral artery is  not visualized. This vessel was better characterized on the recent CTA examination. These results will be called to the ordering clinician or representative by the Radiologist Assistant, and communication documented in the PACS or zVision Dashboard. Electronically Signed   By: Markus Daft M.D.   On: 08/31/2015 16:22   Nm Myocar Multi W/spect W/wall Motion / Ef  09/01/2015   ST segment depression was noted during stress. There is ST depression in inferior and lateral precordial leads at baseline that is unchanged after injection  Findings consistent with prior inferior infarction with mild peri-infarct ischemia. There is evidence of inferolateral and lateral infarct with mild to moderate peri-infact ischemia.  The left ventricular ejection fraction is severely decreased (<30%).  This is a high risk study. High risk due to low ejection fraction and multiple perfusion defects. Of note recent echo shows LVEF 60-65%, suggesting probable nuclear technical issue with volume assessment.    Dg Chest Port 1 View  08/31/2015  CLINICAL DATA:  Chest pain and shortness of Breath EXAM: PORTABLE CHEST 1 VIEW COMPARISON:  08/27/2015 FINDINGS: Cardiac shadow is stable. Postsurgical changes are again noted. Lungs are clear bilaterally. No acute bony abnormality is seen. IMPRESSION: No active disease. Electronically Signed   By: Inez Catalina M.D.   On: 08/31/2015 12:55    Scheduled Meds: . aspirin  300 mg Rectal Daily   Or  . aspirin  325 mg Oral Daily  . atorvastatin  80 mg Oral Daily  . budesonide-formoterol  2 puff Inhalation BID  . docusate sodium  100 mg Oral BID  . fluticasone  1 spray Each Nare Daily  . insulin aspart  0-15 Units Subcutaneous TID WC  . isosorbide mononitrate  120 mg Oral Daily  . lactulose  15 g Oral Daily  . metoprolol  75 mg  Oral BID  . montelukast  10 mg Oral QHS  . omega-3 acid ethyl esters  2 g Oral q morning - 10a  . pantoprazole  40 mg Oral Daily  . polyethylene glycol  17 g  Oral Daily  . ranolazine  1,000 mg Oral BID  . rivaroxaban  20 mg Oral Daily  . sodium chloride  1 spray Each Nare QID  . tiotropium  18 mcg Inhalation Daily  . verapamil  240 mg Oral Daily  . zolpidem  5 mg Oral QHS   Continuous Infusions:   Principal Problem:   Stroke [facility]) Active Problems:   Chest pain   Essential hypertension, benign   Anemia   Type 2 diabetes mellitus (HCC)   Lupus anticoagulant positive   CKD (chronic kidney disease), stage II   COPD, moderate (HCC)   AKI (acute kidney injury) ([place])   Troponin level elevated   Atherosclerosis of native coronary artery of native heart with other form of angina pectoris (HCC)   Thrombocytosis (HCC)   Hyponatremia   Chronic diastolic CHF (congestive heart failure) ([place])   Stroke (cerebrum) (HCC)   Atherosclerosis of native coronary artery of native heart   Acute ischemic stroke ([place])    Time spent: 35 min    Henry [name]  [facility] Pager [phone]. If 7PM-7AM, please contact night-coverage at [email or web address], password [email or web address] 09/01/2015, 11:29 AM  LOS: 2 days

## 2015-09-01 NOTE — Progress Notes (Signed)
OT Cancellation Note  Patient Details Name: Henry [name] MRN: [id number] DOB: Apr 25, 1941   Cancelled Treatment:      Reason evaluation not completed: Pt unavailable. Pt at imaging this am, will attempt evaluation at a later time.    [name], OTR/L  [phone]  09/01/2015, 8:35 AM

## 2015-09-01 NOTE — Progress Notes (Signed)
Patient ID: Henry [name], male   DOB: 05/12/1941, 75 y.o.   MRN: [id number]    Primary cardiologist:  Subjective:    No chest pain overnight, mild discomfort this AM on and off.  Objective:   Temp:  [98 F (36.7 C)-99.1 F (37.3 C)] 99.1 F (37.3 C) (02/17 0600) Pulse Rate:  [72-98] 80 (02/17 0600) Resp:  [16-18] 16 (02/17 0600) BP: (99-129)/(53-77) 129/77 mmHg (02/17 0600) SpO2:  [95 %-100 %] 98 % (02/17 0600) Last BM Date: 08/30/15  Filed Weights   08/31/15 0005  Weight: 172 lb 9.6 oz (78.291 kg)    Intake/Output Summary (Last 24 hours) at 09/01/15 0814 Last data filed at 09/01/15 0643  Gross per 24 hour  Intake    600 ml  Output   1400 ml  Net   -800 ml     Exam:  General: NAD  HEENT: sclera clear, throat clear  Resp: CTAB  Cardiac: RRR, no m/r/g, no jvd  GI: abdomen soft, NT, ND  MSK: no LE edema  Neuro: no focal deficits  Psych: appropriate affect  Lab Results:  Basic Metabolic Panel:  Recent Labs Lab 08/30/15 2010 08/31/15 0722 09/01/15 0713  NA 133* 135 136  K 4.9 4.6 4.4  CL 98* 103 102  CO2 24 26 27   GLUCOSE 177* 131* 130*  BUN 22* 24* 20  CREATININE 1.65* 1.32* 1.13  CALCIUM 8.5* 8.6* 8.7*    Liver Function Tests:  Recent Labs Lab 08/30/15 2010  AST 25  ALT 15*  ALKPHOS 57  BILITOT 0.4  PROT 7.1  ALBUMIN 2.5*    CBC:  Recent Labs Lab 08/30/15 2010 08/31/15 0722 09/01/15 0713  WBC 17.6* 13.0* 10.8*  HGB 8.3* 7.7* 7.8*  HCT 27.1* 24.6* 26.5*  MCV 84.7 83.1 85.8  PLT 585* 488* 566*    Cardiac Enzymes:  Recent Labs Lab 08/30/15 2010 08/31/15 0724 08/31/15 1250  TROPONINI 0.18* 0.48* 0.83*    BNP: No results for input(s): PROBNP in the last 8760 hours.  Coagulation:  Recent Labs Lab 08/27/15 1609 08/30/15 2010  INR 3.34* 3.28*    ECG:   Medications:   Scheduled Medications: . aspirin  300 mg Rectal Daily   Or  . aspirin  325 mg Oral Daily  . atorvastatin  80 mg Oral Daily  .  budesonide-formoterol  2 puff Inhalation BID  . docusate sodium  100 mg Oral BID  . fluticasone  1 spray Each Nare Daily  . insulin aspart  0-15 Units Subcutaneous TID WC  . isosorbide mononitrate  120 mg Oral Daily  . lactulose  15 g Oral Daily  . metoprolol  75 mg Oral BID  . montelukast  10 mg Oral QHS  . omega-3 acid ethyl esters  2 g Oral q morning - 10a  . pantoprazole  40 mg Oral Daily  . polyethylene glycol  17 g Oral Daily  . ranolazine  500 mg Oral BID  . rivaroxaban  20 mg Oral Daily  . sodium chloride  1 spray Each Nare QID  . tiotropium  18 mcg Inhalation Daily  . verapamil  240 mg Oral Daily  . zolpidem  5 mg Oral QHS     Infusions:     PRN Medications:  acetaminophen, albuterol, HYDROcodone-acetaminophen, morphine injection, nitroGLYCERIN, technetium sestamibi generic     Assessment/Plan    1. Chest pain - somewhat atypical symptoms, troponin has continue to trend up. - Cath 2012 LM patent, LAD occluded, LCX  with long stented proximal portion with sequential 40-50% stenosis, the more proximal of the 3 OMs is occluded. LIMA-LAD patent, SVG-RCA occluded, SVG-LCX occluded. - His trending up troponin suggests ongoing ischemia. Based on his anatomy, a repeat cath is very likely of low yield and with his multiple medical comorbidities creates higher risk. We are obtaining a Lexiscan MPI today for further risk stratification and to potentially identify new territories of ischemia noninvasively - medical therapy with ASA, atorva 80, imdur 120, lopressor 75mg  bid, ranexa 500mg  bid. He is on xarelto for histroy of clotting disorder, have not converted to heparin  - overall difficult clinical situation. Somewhat atypical symptoms of chest pain but has objective evidence of ischemia, his comorbidities and known coronary anatomy increase the risk and lower the likely benefit of potential cath. His renal function has improved, if high risk features detected would consider  cath. If low to intermediate risk would manage medically, his LVEF remains normal and stable.     2. TIA - management per neuro, he does have evidence of cerebrovascular disease by imaging.   3. Anemia - management per primary team, if repeat troponin today pending up would recommend transfusion to Hgb of 9-10.        Carlyle Dolly, M.D.

## 2015-09-01 NOTE — Progress Notes (Signed)
PT Cancellation Note  Patient Details Name: Henry [name] MRN: [id number] DOB: [birth year]   Cancelled Treatment:    Reason Eval/Treat Not Completed: Pain limiting ability to participate;Medical issues which prohibited therapy;Patient not medically ready;Other (comment) (RN hold: pt transferring to [facility] this evening).      11:58 AM,09/01/2015 [name], [facility] [phone]

## 2015-09-01 NOTE — Care Management Important Message (Signed)
Important Message  Patient Details  Name: Henry [name] MRN: [id number] Date of Birth: June 09, 1941   Medicare Important Message Given:  Yes    Alvie Heidelberg, RN 09/01/2015, 5:32 PM

## 2015-09-01 NOTE — H&P (Signed)
Patient left the floor with [facility] transporters.  Report was called to Joe the receiving nurse on 3W at [facility].  She verbalized understanding.  I voiced to her that the patient was on the way now.

## 2015-09-01 NOTE — Progress Notes (Signed)
   09/01/15 1014  Vitals  BP (!) 161/84 mmHg  Pulse Rate (!) 102  Notified Dr. Harl Bowie that the patient was c/o chest pain that was radiating down his left arm and to his back.  I asked Dr. Harl Bowie about the patient just having a EKG while in the stress lab and he said the test was fine.  He stated that the patient was tender in his chest where the MD palpated and the area was tender.  MD states that he does not feel that this cardio related.  He suggested to administer the patient NTG and pain meds as needed and to notify him if needed.

## 2015-09-01 NOTE — Progress Notes (Signed)
SLP Cancellation Note  Patient Details Name: Henry [name] MRN: [id number] DOB: 07-31-1940   Cancelled treatment:       Reason Eval/Treat Not Completed: Other: Pt being transferred to [facility] tonight will defer eval to next treating therapist.  Almetta Lovely. Roddie Mc, CCC-SLP Speech Language Pathologist    Wende Bushy 09/01/2015, 3:38 PM

## 2015-09-01 NOTE — Progress Notes (Addendum)
1715 Report given to Clayborn Heron, RN with care link he verbalized understanding and stated that they would be here approx. 20 mins to get the patient.  Patient and his wife was made aware of the transportation on the way to [facility].   1745 aprox. [facility] transporters arrived to pick up the patient and the patient voices that he is having chest tightness and rates it on a scale 4-5 on a scale 0-10.  According to them the unit that we have a bed for does not except active chest pain patients.  So I called the charge nurse on 2[facility] and spoke with the charge nurse Jenny Reichmann on the unit and he confirmed that they could not except active chest pain patients.  Dr. Coralyn Pear was text paged and I'm waiting on the call return.  Eddie Dibbles stated that they did have a bed on 3W and would except.  I would just need to let the MD know.  I will re text Dr. Coralyn Pear.  [phone]  Text paged Dr. Coralyn Pear of the patients new bed assignment asked him to call me with any questions.

## 2015-09-01 NOTE — Progress Notes (Signed)
   09/01/15 1030  Vitals  BP (!) 150/84 mmHg  Pulse Rate (!) 103  Patient continues to complain of chest pain and states he feels not much better than before.  I administere him another NTG.  I will continue to monitor him.  He displays no further symptoms at this time.

## 2015-09-02 ENCOUNTER — Inpatient Hospital Stay (HOSPITAL_COMMUNITY): Payer: Medicare Other

## 2015-09-02 DIAGNOSIS — I6521 Occlusion and stenosis of right carotid artery: Secondary | ICD-10-CM

## 2015-09-02 DIAGNOSIS — I249 Acute ischemic heart disease, unspecified: Secondary | ICD-10-CM

## 2015-09-02 DIAGNOSIS — I63233 Cerebral infarction due to unspecified occlusion or stenosis of bilateral carotid arteries: Secondary | ICD-10-CM

## 2015-09-02 DIAGNOSIS — I209 Angina pectoris, unspecified: Secondary | ICD-10-CM

## 2015-09-02 DIAGNOSIS — Z8673 Personal history of transient ischemic attack (TIA), and cerebral infarction without residual deficits: Secondary | ICD-10-CM | POA: Clinically undetermined

## 2015-09-02 LAB — BASIC METABOLIC PANEL
ANION GAP: 9 (ref 5–15)
BUN: 16 mg/dL (ref 6–20)
CALCIUM: 9 mg/dL (ref 8.9–10.3)
CO2: 26 mmol/L (ref 22–32)
Chloride: 102 mmol/L (ref 101–111)
Creatinine, Ser: 1.23 mg/dL (ref 0.61–1.24)
GFR, EST NON AFRICAN AMERICAN: 56 mL/min — AB (ref >60)
Glucose, Bld: 104 mg/dL — ABNORMAL HIGH (ref 65–99)
Potassium: 4.3 mmol/L (ref 3.5–5.1)
Sodium: 137 mmol/L (ref 135–145)

## 2015-09-02 LAB — CBC
HCT: 28.3 % — ABNORMAL LOW (ref 39.0–52.0)
Hemoglobin: 8.4 g/dL — ABNORMAL LOW (ref 13.0–17.0)
MCH: 24.9 pg — ABNORMAL LOW (ref 26.0–34.0)
MCHC: 29.7 g/dL — ABNORMAL LOW (ref 30.0–36.0)
MCV: 84 fL (ref 78.0–100.0)
PLATELETS: 482 10*3/uL — AB (ref 150–400)
RBC: 3.37 MIL/uL — ABNORMAL LOW (ref 4.22–5.81)
RDW: 16.1 % — AB (ref 11.5–15.5)
WBC: 10.7 10*3/uL — AB (ref 4.0–10.5)

## 2015-09-02 LAB — TYPE AND SCREEN
ABO/RH(D): A NEG
Antibody Screen: NEGATIVE
Unit division: 0

## 2015-09-02 LAB — HEPARIN LEVEL (UNFRACTIONATED): HEPARIN UNFRACTIONATED: 2.01 [IU]/mL — AB (ref 0.30–0.70)

## 2015-09-02 LAB — GLUCOSE, CAPILLARY
GLUCOSE-CAPILLARY: 93 mg/dL (ref 65–99)
GLUCOSE-CAPILLARY: 141 mg/dL — AB (ref 65–99)
Glucose-Capillary: 95 mg/dL (ref 65–99)
Glucose-Capillary: 180 mg/dL — ABNORMAL HIGH (ref 65–99)

## 2015-09-02 LAB — APTT: APTT: 58 s — AB (ref 24–37)

## 2015-09-02 MED ORDER — METOPROLOL TARTRATE 50 MG PO TABS
75.0000 mg | ORAL_TABLET | Freq: Two times a day (BID) | ORAL | Status: DC
  Administered 2015-09-03 – 2015-09-06 (×7): 75 mg via ORAL
  Filled 2015-09-02 (×7): qty 1

## 2015-09-02 MED ORDER — SODIUM CHLORIDE 0.9 % IV BOLUS (SEPSIS)
250.0000 mL | Freq: Once | INTRAVENOUS | Status: AC
  Administered 2015-09-02: 250 mL via INTRAVENOUS

## 2015-09-02 NOTE — Progress Notes (Addendum)
Patient ID: Henry [name], male   DOB: 1941-07-02, 75 y.o.   MRN: [id number]    Subjective:    No chest pain this AM  Objective:   Temp:  [97.8 F (36.6 C)-99.1 F (37.3 C)] 98.4 F (36.9 C) (02/18 0427) Pulse Rate:  [64-103] 70 (02/18 0037) Resp:  [13-20] 13 (02/18 0427) BP: (90-161)/(49-84) 107/60 mmHg (02/18 0400) SpO2:  [92 %-98 %] 96 % (02/18 0427) Weight:  [163 lb 14.4 oz (74.345 kg)-174 lb (78.926 kg)] 174 lb (78.926 kg) (02/18 0427) Last BM Date: 08/31/15  Filed Weights   08/31/15 0005 09/01/15 1800 09/02/15 0427  Weight: 172 lb 9.6 oz (78.291 kg) 163 lb 14.4 oz (74.345 kg) 174 lb (78.926 kg)    Intake/Output Summary (Last 24 hours) at 09/02/15 0754 Last data filed at 09/02/15 0400  Gross per 24 hour  Intake   1270 ml  Output    575 ml  Net    695 ml    Telemetry: NSR  Exam:  General: NAD  HEENT: sclera clear, throat clear  Resp: CTAB  Cardiac: RRR, no m/r/g, no jvd  GI: abdomen soft, NT, ND  MSK: no LE edema  Neuro: no focal deficits  Psych: appropriate affect  Lab Results:  Basic Metabolic Panel:  Recent Labs Lab 08/30/15 2010 08/31/15 0722 09/01/15 0713  NA 133* 135 136  K 4.9 4.6 4.4  CL 98* 103 102  CO2 24 26 27   GLUCOSE 177* 131* 130*  BUN 22* 24* 20  CREATININE 1.65* 1.32* 1.13  CALCIUM 8.5* 8.6* 8.7*    Liver Function Tests:  Recent Labs Lab 08/30/15 2010  AST 25  ALT 15*  ALKPHOS 57  BILITOT 0.4  PROT 7.1  ALBUMIN 2.5*    CBC:  Recent Labs Lab 08/30/15 2010 08/31/15 0722 09/01/15 0713  WBC 17.6* 13.0* 10.8*  HGB 8.3* 7.7* 7.8*  HCT 27.1* 24.6* 26.5*  MCV 84.7 83.1 85.8  PLT 585* 488* 566*    Cardiac Enzymes:  Recent Labs Lab 08/31/15 0724 08/31/15 1250 09/01/15 0713  TROPONINI 0.48* 0.83* 0.42*    BNP: No results for input(s): PROBNP in the last 8760 hours.  Coagulation:  Recent Labs Lab 08/27/15 1609 08/30/15 2010  INR 3.34* 3.28*    ECG:   Medications:   Scheduled  Medications: . aspirin  300 mg Rectal Daily   Or  . aspirin  325 mg Oral Daily  . atorvastatin  80 mg Oral Daily  . budesonide-formoterol  2 puff Inhalation BID  . Chlorhexidine Gluconate Cloth  6 each Topical Q0600  . docusate sodium  100 mg Oral BID  . fluticasone  1 spray Each Nare Daily  . insulin aspart  0-15 Units Subcutaneous TID WC  . isosorbide mononitrate  120 mg Oral Daily  . lactulose  15 g Oral Daily  . metoprolol  75 mg Oral BID  . montelukast  10 mg Oral QHS  . mupirocin ointment  1 application Nasal BID  . omega-3 acid ethyl esters  2 g Oral q morning - 10a  . pantoprazole  40 mg Oral Daily  . polyethylene glycol  17 g Oral Daily  . ranolazine  1,000 mg Oral BID  . sodium chloride  1 spray Each Nare QID  . tiotropium  18 mcg Inhalation Daily  . verapamil  240 mg Oral Daily  . [START ON 09/04/2015] zinc oxide   Topical Q Mon  . zolpidem  5 mg Oral QHS  Infusions: . heparin       PRN Medications:  acetaminophen, albuterol, HYDROcodone-acetaminophen, morphine injection, nitroGLYCERIN, ondansetron (ZOFRAN) IV     Assessment/Plan    1. Chest pain/NSTEMI - somewhat atypical symptoms, however troponin peak to 0.8. Chronic ST/changes on EKG - Cath 2012 LM patent, LAD occluded, LCX with long stented proximal portion with sequential 40-50% stenosis, the more proximal of the 3 OMs is occluded. LIMA-LAD patent, SVG-RCA occluded, SVG-LCX occluded. - we initially tried to manage medically due to comorbidities and previous know coronary anatomy not amenable to revascurization. Pain continued and troponins continued to trend up, thus a nuclear scan was done to risk stratify and assess for new territories of ischemia.  - Nuclear stress shows inferior infact with mild peri-infarct ischemia, inferolateral/lateral infarct with mild to moderate peri-infarct ischemia.  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 - recommend cath Monday, will need to have neuro input on recent TIA as well as ortho input on his external fixator and how DAPT may affect its management, in conversation with medicine team yesterday I have asked them to arrange these consults.   2. TIA - management per neuro, he does have evidence of cerebrovascular disease by imaging.   3. Anemia - management per primary team, if repeat troponin today pending up would recommend transfusion to Hgb of 9-10.   4. History of PE - he has history of clotting disorder on chronic xarelto, we have held this and started heparin in anticipation for cath. Will need to resume post cath.. - heparin not on MAR, patient received xarelto yesterday, per pharm to start today at 11AM.        Carlyle Dolly, M.D.

## 2015-09-02 NOTE — Consult Note (Signed)
  Dr. Wyline Copas asked if I could take a look at Henry [name] right ankle.  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  I assessed the wound and the ex-fix pin sites.  There is no active infection and I placed new mepatel dressing on the posterior calf wound.  He will still need outpatient follow-up with his treating clinicians in [place].  He can proceed with a cardia cath from an ortho standpoint and the dressing on his right calf can remain there until his outpatient follow-up.  The ring/pin sites can be cleaned daily with hydrogen peroxide diluted in saline using Q-tips.

## 2015-09-02 NOTE — Progress Notes (Signed)
[place] for heparin Indication: chest pain/ACS  Allergies  Allergen Reactions  . Ace Inhibitors Cough    Cough  . Gabapentin Other (See Comments)    WAS BEHAVING VERY ODDLY    Patient Measurements: Height: 5\' 2"  (157.5 cm) Weight: 174 lb (78.926 kg) IBW/kg (Calculated) : 54.6  Heparin dosing weight 78.3 kg   Vital Signs: BP: 109/57 mmHg (02/18 1900) Pulse Rate: 59 (02/18 1600)  Labs:  Recent Labs  08/30/15 2010 08/31/15 0722 08/31/15 0724 08/31/15 1250 09/01/15 0713 09/02/15 0803 09/02/15 1850  HGB 8.3* 7.7*  --   --  7.8* 8.4*  --   HCT 27.1* 24.6*  --   --  26.5* 28.3*  --   PLT 585* 488*  --   --  566* 482*  --   APTT 49*  --   --   --   --   --  58*  LABPROT 32.7*  --   --   --   --   --   --   INR 3.28*  --   --   --   --   --   --   HEPARINUNFRC  --   --   --   --   --   --  2.01*  CREATININE 1.65* 1.32*  --   --  1.13 1.23  --   TROPONINI 0.18*  --  0.48* 0.83* 0.42*  --   --     Estimated Creatinine Clearance: 47.2 mL/min (by C-G formula based on Cr of 1.23).   Medications:  Prescriptions prior to admission  Medication Sig Dispense Refill Last Dose  . acetaminophen (TYLENOL) 325 MG tablet Take 650 mg by mouth every 4 (four) hours as needed for mild pain. For pain   08/22/2015 at Unknown time  . albuterol (PROAIR HFA) 108 (90 BASE) MCG/ACT inhaler Inhale 2 puffs into the lungs every 6 (six) hours as needed for wheezing or shortness of breath.    08/27/2015 at Unknown time  . atorvastatin (LIPITOR) 80 MG tablet Take 1 tablet (80 mg total) by mouth daily. 30 tablet 0 08/26/2015 at 1700  . budesonide-formoterol (SYMBICORT) 160-4.5 MCG/ACT inhaler Inhale 2 puffs into the lungs 2 (two) times daily.   08/27/2015 at Unknown time  . Coenzyme Q10 (CO Q 10) 100 MG CAPS Take 100 mg by mouth daily.   08/27/2015 at Unknown time  . docusate sodium (COLACE) 100 MG capsule Take 100 mg by mouth 2 (two) times daily.   08/27/2015 at Unknown  time  . fish oil-omega-3 fatty acids 1000 MG capsule Take 2 g by mouth every morning.    08/27/2015 at [place]  . fluticasone (FLONASE) 50 MCG/ACT nasal spray Place 1 spray into both nostrils daily.    08/29/2015 at Unknown time  . furosemide (LASIX) 20 MG tablet Take 20 mg by mouth daily.   08/27/2015 at Unknown time  . HYDROcodone-acetaminophen (NORCO) 7.5-325 MG tablet Take 1 tablet by mouth every 6 (six) hours as needed for moderate pain. (Patient taking differently: Take 1 tablet by mouth 2 (two) times daily. *May take every 4 hours as needed for pain) 15 tablet 0 08/29/2015 at Unknown time  . isosorbide mononitrate (IMDUR) 120 MG 24 hr tablet Take 120 mg by mouth daily.   08/27/2015 at Unknown time  . lactulose (CHRONULAC) 10 GM/15ML solution Take 15 g by mouth daily.    08/29/2015 at Unknown time  . losartan (COZAAR) 100 MG  tablet Take 1 tablet (100 mg total) by mouth daily. 30 tablet 6 08/27/2015 at Unknown time  . metoprolol (LOPRESSOR) 50 MG tablet TAKE 1 AND 1/2 TABLETS BY MOUTH TWICE DAILY 90 tablet 2 08/27/2015 at 1000a  . montelukast (SINGULAIR) 10 MG tablet Take 10 mg by mouth at bedtime.   08/26/2015 at Unknown time  . nitroGLYCERIN (NITROSTAT) 0.4 MG SL tablet Place 1 tablet (0.4 mg total) under the tongue every 5 (five) minutes x 3 doses as needed. For chest pain 25 tablet 3 08/27/2015 at Unknown time  . omeprazole (PRILOSEC) 40 MG capsule Take 40 mg by mouth every evening.   08/29/2015 at 600  . polyethylene glycol (MIRALAX / GLYCOLAX) packet Take 17 g by mouth daily.   08/27/2015 at Unknown time  . potassium chloride (K-DUR) 10 MEQ tablet Take 10 mEq by mouth every morning.   08/27/2015 at Unknown time  . rivaroxaban (XARELTO) 20 MG TABS tablet Take 20 mg by mouth daily.   08/27/2015 at [place]  . sitaGLIPtin (JANUVIA) 25 MG tablet Take 1 tablet (25 mg total) by mouth daily. 30 tablet 0 08/27/2015 at Unknown time  . sodium chloride (OCEAN) 0.65 % SOLN nasal spray Place 1 spray into both nostrils 4  (four) times daily.   08/29/2015 at Unknown time  . tiotropium (SPIRIVA) 18 MCG inhalation capsule Place 1 capsule (18 mcg total) into inhaler and inhale daily. 30 capsule 5 08/27/2015 at Unknown time  . verapamil (CALAN-SR) 240 MG CR tablet TAKE 1 TABLET (240 MG TOTAL) BY MOUTH DAILY. 30 tablet 6 08/27/2015 at Unknown time  . zolpidem (AMBIEN) 10 MG tablet Take 10 mg by mouth at bedtime.  2 08/27/2015 at Unknown time  . chlorthalidone (HYGROTON) 25 MG tablet TAKE 1 TABLET BY MOUTH DAILY (Patient not taking: Reported on 08/30/2015) 30 tablet 6 Taking  . ranolazine (RANEXA) 1000 MG SR tablet Take 1 tablet (1,000 mg total) by mouth 2 (two) times daily. (Patient not taking: Reported on 08/30/2015) 60 tablet 1   . sertraline (ZOLOFT) 25 MG tablet Take 1 tablet (25 mg total) by mouth daily. (Patient not taking: Reported on 08/30/2015) 30 tablet 1     Assessment: 75 yo male on heparin for r/o ACS (noted on xarelto PTA for hx PE with lupus AC).  -Heparin level= 2.01 (influence of xarelto) and not reliable -aPTT= 58 and below goal  Goal of Therapy:  APTT 66-102 INR 2-3 Monitor platelets by anticoagulation protocol: Yes   Plan:  -Increase heparin to 1200 units/hr -Heparin level and CBC in am  Hildred Laser, Pharm D 09/02/2015 7:59 PM

## 2015-09-02 NOTE — Progress Notes (Signed)
BP 90/59. Pt stating he is feeling a little better than earlier. Pt resting in bed. Will continue to monitor. Carroll Kinds RN

## 2015-09-02 NOTE — Progress Notes (Signed)
Pt BP 76/43, pt states vision in right eye is more blurry than this am and states he feels weak. Notified Dr. Earlie Counts new orders for bolus given. Continue to monitor pt. Carroll Kinds RN

## 2015-09-02 NOTE — Consult Note (Signed)
Requesting Physician: Dr.  Wyline Copas    Reason for consultation: Left sided weakness  HPI:                                                                                                                                         Henry [name] is an 75 y.o. male patient who was noted to have transient neurological symptoms of left sided weakness and some non specific blurred vision just prior to admission. His work up showed 70% right ICA stenosis in neck, and Vertebral arteries showing Calcific atherosclerosis results in severe stenosis bilateral vertebral artery origins, with mild poststenotic dilatation distal LEFT V4 segment. LEFT vertebral artery is dominant. Extrinsic calcific atherosclerosis results in moderate tandem stenosis LEFT V4 segment.   He has right ankle fracture, and has external metallic fixation device, due to which MRI brain couldn't be done.  CT head showed no definite acute stroke per radiology report.   The vision symptoms and weakness in left arm have improved now, per patient. Has pain and reduced mobility in b/l legs at baseline,  from DM complications and arthritis.    Past Medical History: Past Medical History  Diagnosis Date  . Coronary atherosclerosis of native coronary artery     a. NSTEMI 2005 s/p CABG (LIMA - LAD, SVG - OM, SVG - PDA). b. DES to Cx 02/2005. c. NSTEMI 07/2010 due to occ Cx - med rx. Known occluded vein grafts.  . Essential hypertension, benign   . Type 2 diabetes mellitus ([place])   . Dyslipidemia   . ACE inhibitor intolerance   . Asthma   . Recurrent pulmonary embolism ([place])     a. 01/2011. b. Bilateral PE 11/2011 - + lupus anticoagulant preliminary testing. Felt to require lifelong Coumadin.  . Pulmonary nodule     RML by CT 11/2011, stable compared to 2012.  Marland Kitchen Cholelithiasis   . Pulmonary HTN ([place])     Echo 11/2011 at time of PE. PA pressure not assessed on 03/2012 echo but RV was back to normal.  . Lupus anticoagulant positive   . GERD  (gastroesophageal reflux disease)   . COPD (chronic obstructive pulmonary disease) ([place])   . OSA (obstructive sleep apnea)     Past Surgical History  Procedure Laterality Date  . Coronary artery bypass graft  2005     LIMA to LAD, SVG to OM, SVG to RCA  . L4-l5 laminectomy    . Colonoscopy    . Esophagogastroduodenoscopy N/A 12/19/2013    Procedure: ESOPHAGOGASTRODUODENOSCOPY (EGD);  Surgeon: Lafayette Dragon, MD;  Location: [facility] ENDOSCOPY;  Service: Endoscopy;  Laterality: N/A;  . Kidney stone removed      Family History: Family History  Problem Relation Age of Onset  . Heart disease Father   . Heart disease Sister   . Breast cancer Sister   . Heart disease Brother  Social History:   reports that he quit smoking about 43 years ago. His smoking use included Cigarettes. He started smoking about 69 years ago. He has a 9 pack-year smoking history. His smokeless tobacco use includes Chew. He reports that he does not drink alcohol or use illicit drugs.  Allergies:  Allergies  Allergen Reactions  . Ace Inhibitors Cough    Cough  . Gabapentin Other (See Comments)    WAS BEHAVING VERY ODDLY     Medications:                                                                                                                         Current facility-administered medications:  .  acetaminophen (TYLENOL) tablet 650 mg, 650 mg, Oral, Q4H PRN, Ilene Qua Opyd, MD .  albuterol (PROVENTIL) (2.5 MG/3ML) 0.083% nebulizer solution 3 mL, 3 mL, Inhalation, Q6H PRN, Ilene Qua Opyd, MD .  aspirin suppository 300 mg, 300 mg, Rectal, Daily, 300 mg at 09/01/15 1024 **OR** aspirin tablet 325 mg, 325 mg, Oral, Daily, Vianne Bulls, MD, 325 mg at 09/02/15 0959 .  atorvastatin (LIPITOR) tablet 80 mg, 80 mg, Oral, Daily, Vianne Bulls, MD, 80 mg at 09/02/15 0959 .  budesonide-formoterol (SYMBICORT) 160-4.5 MCG/ACT inhaler 2 puff, 2 puff, Inhalation, BID, Vianne Bulls, MD, 2 puff at 09/01/15 1005 .   Chlorhexidine Gluconate Cloth 2 % PADS 6 each, 6 each, Topical, Q0600, Kelvin Cellar, MD, 6 each at 09/02/15 0430 .  docusate sodium (COLACE) capsule 100 mg, 100 mg, Oral, BID, Ilene Qua Opyd, MD, 100 mg at 09/02/15 0959 .  fluticasone (FLONASE) 50 MCG/ACT nasal spray 1 spray, 1 spray, Each Nare, Daily, Ilene Qua Opyd, MD, 1 spray at 09/01/15 1028 .  heparin ADULT infusion 100 units/mL (25000 units/250 mL), 1,100 Units/hr, Intravenous, Continuous, Kelvin Cellar, MD, Last Rate: 11 mL/hr at 09/02/15 1143, 1,100 Units/hr at 09/02/15 1143 .  HYDROcodone-acetaminophen (NORCO) 7.5-325 MG per tablet 1-2 tablet, 1-2 tablet, Oral, Q4H PRN, Vianne Bulls, MD, 1 tablet at 09/02/15 1559 .  insulin aspart (novoLOG) injection 0-15 Units, 0-15 Units, Subcutaneous, TID WC, Vianne Bulls, MD, 3 Units at 09/01/15 1258 .  isosorbide mononitrate (IMDUR) 24 hr tablet 120 mg, 120 mg, Oral, Daily, Ilene Qua Opyd, MD, 120 mg at 09/02/15 0959 .  lactulose (CHRONULAC) 10 GM/15ML solution 15 g, 15 g, Oral, Daily, Ilene Qua Opyd, MD, 15 g at 09/02/15 1000 .  metoprolol tartrate (LOPRESSOR) tablet 75 mg, 75 mg, Oral, BID, Donne Hazel, MD .  montelukast (SINGULAIR) tablet 10 mg, 10 mg, Oral, QHS, Ilene Qua Opyd, MD, 10 mg at 09/01/15 2138 .  morphine 2 MG/ML injection 2 mg, 2 mg, Intravenous, Q6H PRN, Arnoldo Lenis, MD, 2 mg at 09/02/15 1902 .  mupirocin ointment (BACTROBAN) 2 % 1 application, 1 application, Nasal, BID, Kelvin Cellar, MD, 1 application at [id number] .  nitroGLYCERIN (NITROSTAT) SL tablet 0.4 mg, 0.4 mg, Sublingual, Q5 min PRN,  Ilene Qua Opyd, MD, 0.4 mg at 09/01/15 1027 .  omega-3 acid ethyl esters (LOVAZA) capsule 2 g, 2 g, Oral, q morning - 10a, Ilene Qua Opyd, MD, 2 g at 09/02/15 0959 .  ondansetron (ZOFRAN) injection 4 mg, 4 mg, Intravenous, Q6H PRN, Kelvin Cellar, MD, 4 mg at 09/01/15 1741 .  pantoprazole (PROTONIX) EC tablet 40 mg, 40 mg, Oral, Daily, Vianne Bulls, MD, 40 mg at 09/02/15  0959 .  polyethylene glycol (MIRALAX / GLYCOLAX) packet 17 g, 17 g, Oral, Daily, Ilene Qua Opyd, MD, 17 g at 09/02/15 1144 .  ranolazine (RANEXA) 12 hr tablet 1,000 mg, 1,000 mg, Oral, BID, Arnoldo Lenis, MD, 1,000 mg at 09/02/15 0959 .  sodium chloride (OCEAN) 0.65 % nasal spray 1 spray, 1 spray, Each Nare, QID, Ilene Qua Opyd, MD, 1 spray at 09/01/15 1400 .  tiotropium (SPIRIVA) inhalation capsule 18 mcg, 18 mcg, Inhalation, Daily, Vianne Bulls, MD, 18 mcg at 08/31/15 1426 .  verapamil (CALAN-SR) CR tablet 240 mg, 240 mg, Oral, Daily, Ilene Qua Opyd, MD, 240 mg at 09/02/15 0959 .  [START ON 09/04/2015] zinc oxide 20 % ointment, , Topical, Q Mon, Kelvin Cellar, MD .  zolpidem [facility]) tablet 5 mg, 5 mg, Oral, QHS, Ilene Qua Opyd, MD, 5 mg at 09/01/15 2138   ROS:                                                                                                                                       History obtained from the patient  General ROS: negative for - chills, fatigue, fever, night sweats, weight gain or weight loss Psychological ROS: negative for - behavioral disorder, hallucinations, memory difficulties, mood swings or suicidal ideation Ophthalmic ROS: positive for - blurry vision, negative for double vision, eye pain or loss of vision ENT ROS: negative for - epistaxis, nasal discharge, oral lesions, sore throat, tinnitus or vertigo Allergy and Immunology ROS: negative for - hives or itchy/watery eyes Hematological and Lymphatic ROS: negative for - bleeding problems, bruising or swollen lymph nodes Endocrine ROS: negative for - galactorrhea, hair pattern changes, polydipsia/polyuria or temperature intolerance Respiratory ROS: negative for - cough, hemoptysis, shortness of breath or wheezing Cardiovascular ROS: negative for - chest pain, dyspnea on exertion, edema or irregular heartbeat Gastrointestinal ROS: negative for - abdominal pain, diarrhea, hematemesis, nausea/vomiting or  stool incontinence Genito-Urinary ROS: negative for - dysuria, hematuria, incontinence or urinary frequency/urgency Musculoskeletal ROS: negative for - joint swelling , positive for b/l knee and hip pain Neurological ROS: as noted in HPI Dermatological ROS: negative for rash and skin lesion changes  Neurologic Examination:  Blood pressure 109/57, pulse 59, temperature 98.4 F (36.9 C), temperature source Oral, resp. rate 10, height 5\' 2"  (1.575 m), weight 78.926 kg (174 lb), SpO2 94 %.  Evaluation of higher integrative functions including: Level of alertness: Alert,  Oriented to time, place and person Attention span and concentration  - intact   Speech: fluent, no evidence of dysarthria or aphasia noted.  Test the following cranial nerves: 2-12 grossly intact. No visual field deficits noted.  Motor examination: Normal tone, bulk, full 5/5 motor strength in b/l upper extremities, and left lower extremity, abl to lift rigt leg, not cooperative for resistance testing in right leg due to severe pain.  Examination of sensation : length dependant sensory loss due to DM, otherwise proximally symmetric sensation to pinprick in all 4 extremities and on face Examination of deep tendon reflexes: 2+, normal and symmetric in all extremities, normal plantars bilaterally Test coordination: Normal finger nose testing, with no evidence of limb appendicular ataxia or abnormal involuntary movements or tremors noted.  Gait: Deferred   Lab Results: Basic Metabolic Panel:  Recent Labs Lab 08/28/15 0437 08/30/15 2010 08/31/15 0722 09/01/15 0713 09/02/15 0803  NA 137 133* 135 136 137  K 4.5 4.9 4.6 4.4 4.3  CL 105 98* 103 102 102  CO2 23 24 26 27 26   GLUCOSE 130* 177* 131* 130* 104*  BUN 12 22* 24* 20 16  CREATININE 1.01 1.65* 1.32* 1.13 1.23  CALCIUM 8.7* 8.5* 8.6* 8.7* 9.0    Liver Function  Tests:  Recent Labs Lab 08/30/15 2010  AST 25  ALT 15*  ALKPHOS 57  BILITOT 0.4  PROT 7.1  ALBUMIN 2.5*   No results for input(s): LIPASE, AMYLASE in the last 168 hours. No results for input(s): AMMONIA in the last 168 hours.  CBC:  Recent Labs Lab 08/30/15 1032 08/30/15 2010 08/31/15 0722 09/01/15 0713 09/02/15 0803  WBC 10.0 17.6* 13.0* 10.8* 10.7*  NEUTROABS  --  10.9*  --   --   --   HGB 8.2* 8.3* 7.7* 7.8* 8.4*  HCT 26.3* 27.1* 24.6* 26.5* 28.3*  MCV 83.2 84.7 83.1 85.8 84.0  PLT 499* 585* 488* 566* 482*    Cardiac Enzymes:  Recent Labs Lab 08/28/15 0435 08/30/15 2010 08/31/15 0724 08/31/15 1250 09/01/15 0713  TROPONINI 0.18* 0.18* 0.48* 0.83* 0.42*    Lipid Panel:  Recent Labs Lab 08/31/15 0319  CHOL 93  TRIG 87  HDL 31*  CHOLHDL 3.0  VLDL 17  LDLCALC 45    CBG:  Recent Labs Lab 09/01/15 1607 09/01/15 2044 09/02/15 0754 09/02/15 1126 09/02/15 1632  GLUCAP 107* 122* 95 93 141*    Microbiology: Results for orders placed or performed during the hospital encounter of 08/30/15  MRSA PCR Screening     Status: Abnormal   Collection Time: 09/01/15  6:59 PM  Result Value Ref Range Status   MRSA by PCR POSITIVE (A) NEGATIVE Final    Comment:        The GeneXpert MRSA Assay (FDA approved for NASAL specimens only), is one component of a comprehensive MRSA colonization surveillance program. It is not intended to diagnose MRSA infection nor to guide or monitor treatment for MRSA infections. RESULT CALLED TO, READ BACK BY AND VERIFIED WITHMorton Stall RN [id number] 09/01/15 A BROWNING      Imaging: Ct Angio Head W/cm &/or Wo Cm  08/30/2015  CLINICAL DATA:  LEFT-sided weakness today.  Code stroke. EXAM: CT ANGIOGRAPHY HEAD AND NECK TECHNIQUE: Multidetector  CT imaging of the head and neck was performed using the standard protocol during bolus administration of intravenous contrast. Multiplanar CT image reconstructions and MIPs were obtained to  evaluate the vascular anatomy. Carotid stenosis measurements (when applicable) are obtained utilizing NASCET criteria, using the distal internal carotid diameter as the denominator. CONTRAST:  119mL OMNIPAQUE IOHEXOL 350 MG/ML SOLN COMPARISON:  CT head August 30, 2015 at 2042 hours. FINDINGS: CTA NECK Aortic arch: Normal appearance of the thoracic arch, normal branch pattern. Mild calcific atherosclerosis. The origins of the innominate, left Common carotid artery and subclavian artery are widely patent. Right carotid system: Common carotid artery is patent, coursing in a straight line fashion. Eccentric intimal thickening and calcific atherosclerosis results in very mild stenosis mid RIGHT Common carotid artery. Eccentric calcific atherosclerosis results in 65-70% stenosis RIGHT internal carotid artery, 17 mm segment. Mild poststenotic dilatation. Normal appearance of the included internal carotid artery. Left carotid system: Common carotid artery is widely patent, coursing in a straight line fashion. Eccentric calcific atherosclerosis results in less than 50% stenosis. Normal appearance of the mid to distal cervical internal carotid artery. Vertebral arteries:Calcific atherosclerosis results in severe stenosis bilateral vertebral artery origins, with mild poststenotic dilatation distal LEFT V4 segment. LEFT vertebral artery is dominant. Extrinsic calcific atherosclerosis results in moderate tandem stenosis LEFT V4 segment. Skeleton: No acute osseous process though bone windows have not been submitted. Poor dentition with multiple dental caries, and absent teeth. C5-6 intradiscal calcifications, and findings of auto interbody arthrodesis. Severe C6-7 degenerative disc. Severe LEFT upper cervical facet arthropathy. Status post median sternotomy. Other neck: Soft tissues of the neck are nonacute though, not tailored for evaluation. CTA HEAD Anterior circulation: Patent cervical internal carotid arteries, petrous,  cavernous and supra clinoid internal carotid arteries. Calcific atherosclerosis of the carotid siphons results in at least moderate stenosis of bilateral supraclinoid internal carotid arteries. Widely patent anterior communicating artery. Patent anterior and middle cerebral arteries. Mild stenosis proximal RIGHT A1 segment. Posterior circulation: RIGHT vertebral artery terminates in the posterior inferior cerebellar artery. Heavy calcification of distal LEFT V4 segment resulting at least moderate long segment of stenosis to proximal basilar artery which is patent. Bilateral small posterior communicating arteries are present. High-grade stenosis RIGHT P1 segment. Bilateral posterior cerebral arteries are patent. Mild tandem stenosis LEFT posterior cerebral artery compatible with atherosclerosis. No large vessel occlusion, dissection, contrast extravasation or aneurysm within the anterior nor posterior circulation. No abnormal intracranial enhancement. IMPRESSION: CTA NECK: Atherosclerosis resulting in 65-70% stenosis RIGHT internal carotid artery origin. Less than 50% stenosis LEFT internal carotid artery origin. High-grade stenosis bilateral vertebral artery origins, which are patent. CTA HEAD:  No acute large vessel occlusion. Moderate stenosis of bilateral supraclinoid internal carotid artery due to calcific atherosclerosis. Moderate stenosis LEFT V4 segment. High-grade stenosis RIGHT P1 segment, complete circle of Willis. Electronically Signed   By: Elon Alas M.D.   On: 08/30/2015 22:15   Dg Ankle 2 Views Right  09/02/2015  CLINICAL DATA:  Ankle fracture. EXAM: RIGHT ANKLE - 2 VIEW COMPARISON:  02/05/2015 right tibia/fibula radiographs. FINDINGS: External fixation hardware overlies the right midfoot. No fracture, dislocation or suspicious focal osseous lesion is seen in the right ankle. There is diffuse osteopenia. Vascular calcifications are seen throughout the soft tissues. Small Achilles right  calcaneal spur. Staples overlie the posterior distal right lower extremity at the level of the distal right tibial shaft. IMPRESSION: External fixation hardware overlies the right midfoot. No fracture or dislocation is seen in the right ankle.  Diffuse osteopenia. Electronically Signed   By: Ilona Sorrel M.D.   On: 09/02/2015 18:12   Ct Head Wo Contrast  09/01/2015  CLINICAL DATA:  Left facial droop.  Blurred vision 1 week ago EXAM: CT HEAD WITHOUT CONTRAST TECHNIQUE: Contiguous axial images were obtained from the base of the skull through the vertex without intravenous contrast. COMPARISON:  08/30/2015 FINDINGS: There is atrophy and chronic small vessel disease changes. No acute intracranial abnormality. Specifically, no hemorrhage, hydrocephalus, mass lesion, acute infarction, or significant intracranial injury. No acute calvarial abnormality. Visualized paranasal sinuses and mastoids clear. Orbital soft tissues unremarkable. IMPRESSION: No acute intracranial abnormality. Atrophy, chronic microvascular disease. Electronically Signed   By: Rolm Baptise M.D.   On: 09/01/2015 10:30   Ct Head Wo Contrast  08/30/2015  CLINICAL DATA:  Code stroke.  Left facial droop. EXAM: CT HEAD WITHOUT CONTRAST TECHNIQUE: Contiguous axial images were obtained from the base of the skull through the vertex without intravenous contrast. COMPARISON:  04/03/2015. FINDINGS: There is no evidence for acute hemorrhage, hydrocephalus, mass lesion, or abnormal extra-axial fluid collection. No definite CT evidence for acute infarction. Diffuse loss of parenchymal volume is consistent with atrophy. Patchy low attenuation in the deep hemispheric and periventricular white matter is nonspecific, but likely reflects chronic microvascular ischemic demyelination. The visualized paranasal sinuses and mastoid air cells are clear. IMPRESSION: Stable.  No acute intracranial abnormality. Atrophy with chronic small vessel white matter ischemic disease.  Critical Value/emergent results were called by me at the time of interpretation on 08/30/2015 at 8:23 pm to Dr. Milton Ferguson , who verbally acknowledged these results. Electronically Signed   By: Misty Stanley M.D.   On: 08/30/2015 20:23   Ct Angio Neck W/cm &/or Wo/cm  08/30/2015  CLINICAL DATA:  LEFT-sided weakness today.  Code stroke. EXAM: CT ANGIOGRAPHY HEAD AND NECK TECHNIQUE: Multidetector CT imaging of the head and neck was performed using the standard protocol during bolus administration of intravenous contrast. Multiplanar CT image reconstructions and MIPs were obtained to evaluate the vascular anatomy. Carotid stenosis measurements (when applicable) are obtained utilizing NASCET criteria, using the distal internal carotid diameter as the denominator. CONTRAST:  148mL OMNIPAQUE IOHEXOL 350 MG/ML SOLN COMPARISON:  CT head August 30, 2015 at 2042 hours. FINDINGS: CTA NECK Aortic arch: Normal appearance of the thoracic arch, normal branch pattern. Mild calcific atherosclerosis. The origins of the innominate, left Common carotid artery and subclavian artery are widely patent. Right carotid system: Common carotid artery is patent, coursing in a straight line fashion. Eccentric intimal thickening and calcific atherosclerosis results in very mild stenosis mid RIGHT Common carotid artery. Eccentric calcific atherosclerosis results in 65-70% stenosis RIGHT internal carotid artery, 17 mm segment. Mild poststenotic dilatation. Normal appearance of the included internal carotid artery. Left carotid system: Common carotid artery is widely patent, coursing in a straight line fashion. Eccentric calcific atherosclerosis results in less than 50% stenosis. Normal appearance of the mid to distal cervical internal carotid artery. Vertebral arteries:Calcific atherosclerosis results in severe stenosis bilateral vertebral artery origins, with mild poststenotic dilatation distal LEFT V4 segment. LEFT vertebral artery is  dominant. Extrinsic calcific atherosclerosis results in moderate tandem stenosis LEFT V4 segment. Skeleton: No acute osseous process though bone windows have not been submitted. Poor dentition with multiple dental caries, and absent teeth. C5-6 intradiscal calcifications, and findings of auto interbody arthrodesis. Severe C6-7 degenerative disc. Severe LEFT upper cervical facet arthropathy. Status post median sternotomy. Other neck: Soft tissues of the neck are nonacute though, not  tailored for evaluation. CTA HEAD Anterior circulation: Patent cervical internal carotid arteries, petrous, cavernous and supra clinoid internal carotid arteries. Calcific atherosclerosis of the carotid siphons results in at least moderate stenosis of bilateral supraclinoid internal carotid arteries. Widely patent anterior communicating artery. Patent anterior and middle cerebral arteries. Mild stenosis proximal RIGHT A1 segment. Posterior circulation: RIGHT vertebral artery terminates in the posterior inferior cerebellar artery. Heavy calcification of distal LEFT V4 segment resulting at least moderate long segment of stenosis to proximal basilar artery which is patent. Bilateral small posterior communicating arteries are present. High-grade stenosis RIGHT P1 segment. Bilateral posterior cerebral arteries are patent. Mild tandem stenosis LEFT posterior cerebral artery compatible with atherosclerosis. No large vessel occlusion, dissection, contrast extravasation or aneurysm within the anterior nor posterior circulation. No abnormal intracranial enhancement. IMPRESSION: CTA NECK: Atherosclerosis resulting in 65-70% stenosis RIGHT internal carotid artery origin. Less than 50% stenosis LEFT internal carotid artery origin. High-grade stenosis bilateral vertebral artery origins, which are patent. CTA HEAD:  No acute large vessel occlusion. Moderate stenosis of bilateral supraclinoid internal carotid artery due to calcific atherosclerosis.  Moderate stenosis LEFT V4 segment. High-grade stenosis RIGHT P1 segment, complete circle of Willis. Electronically Signed   By: Elon Alas M.D.   On: 08/30/2015 22:15   US Carotid Bilateral  08/31/2015  CLINICAL DATA:  Stroke.  Carotid artery disease on recent CTA. EXAM: BILATERAL CAROTID DUPLEX ULTRASOUND TECHNIQUE: Pearline Cables scale imaging, color Doppler and duplex ultrasound were performed of bilateral carotid and vertebral arteries in the neck. COMPARISON:  07/17/2010 and CTA 08/30/2015 FINDINGS: Criteria: Quantification of carotid stenosis is based on velocity parameters that correlate the residual internal carotid diameter with NASCET-based stenosis levels, using the diameter of the distal internal carotid lumen as the denominator for stenosis measurement. The following velocity measurements were obtained: RIGHT ICA:  465 cm/sec CCA:  94 cm/sec SYSTOLIC ICA/CCA RATIO:  4.9 DIASTOLIC ICA/CCA RATIO:  22 ECA:  90 cm/sec LEFT ICA:  129 cm/sec CCA:  [id number] cm/sec SYSTOLIC ICA/CCA RATIO:  1.4 DIASTOLIC ICA/CCA RATIO:  1.6 ECA:  99 cm/sec RIGHT CAROTID ARTERY: Small amount of circumferential echogenic plaque in the distal common carotid artery. Circumferential plaque at the right carotid bulb. Echogenic plaque and critical stenosis in the proximal and mid right internal carotid artery. There is a markedly elevated peak systolic velocity measuring up to 465 cm/sec in the mid internal carotid artery with turbulent flow. Peak systolic velocity in the distal internal carotid artery is also elevated measuring up to 213 cm/sec. RIGHT VERTEBRAL ARTERY: Antegrade flow and normal waveform in the right vertebral artery. LEFT CAROTID ARTERY: Small amount of echogenic plaque throughout the left common carotid artery. Small amount of plaque in the left carotid bulb. Echogenic plaque in the proximal internal carotid artery. Peak systolic velocity in the internal carotid artery is mildly elevated, measuring up to 129 cm/sec. LEFT  VERTEBRAL ARTERY:  Not confidently visualized. IMPRESSION: Atherosclerotic plaque in the right internal carotid artery with a critical stenosis. Estimated degree of stenosis is greater than 70% and suspect a near occlusion. Atherosclerotic plaque involving the left internal carotid artery. Estimated degree of stenosis in left internal carotid artery is less than 50%. Patent right vertebral artery. Left vertebral artery is not visualized. This vessel was better characterized on the recent CTA examination. These results will be called to the ordering clinician or representative by the Radiologist Assistant, and communication documented in the PACS or zVision Dashboard. Electronically Signed   By: Scherrie Gerlach.D.  On: 08/31/2015 16:22   Nm Myocar Multi W/spect W/wall Motion / Ef  09/01/2015   ST segment depression was noted during stress. There is ST depression in inferior and lateral precordial leads at baseline that is unchanged after injection  Findings consistent with prior inferior infarction with mild peri-infarct ischemia. There is evidence of inferolateral and lateral infarct with mild to moderate peri-infact ischemia.  The left ventricular ejection fraction is severely decreased (<30%).  This is a high risk study. High risk due to low ejection fraction and multiple perfusion defects. Of note recent echo shows LVEF 60-65%, suggesting probable nuclear technical issue with volume assessment.    Dg Chest Port 1 View  08/31/2015  CLINICAL DATA:  Chest pain and shortness of Breath EXAM: PORTABLE CHEST 1 VIEW COMPARISON:  08/27/2015 FINDINGS: Cardiac shadow is stable. Postsurgical changes are again noted. Lungs are clear bilaterally. No acute bony abnormality is seen. IMPRESSION: No active disease. Electronically Signed   By: Inez Catalina M.D.   On: 08/31/2015 12:55    Assessment and plan:   Henry [name] is an 75 y.o. male patient with recent symptoms of left sided weakness and blurred vision. He  was unable to specify further details of the blurred vision, no clear history suggestive of visual field defects. No motor weakness now on exam in b/l arms, and left leg, right leg sever pain , in external fixation. Given his risk factors with significant vascular stenosis on CTA, cannot exclude the possibility of small infarcts causing his symptoms, which have resolved now. CT brain x 2, showed no definite evidence for acute infarcts. MRI couldn't be done due to external ankle fixator. He has 70% left ICA stenosis and multiple areas of stenosis involving left vertebral artery noted on CTA neck. He is currently on asa alone. Consider adding plavix 75 mg daily to asa 81 mg daily given the significant vascular stenosis, if no significant bleeding issues. Dual antiplatelet therapy may increase risk for bleeding. He will also benefit from a cerebral angiogram by Dr. Tilda Burrow to evaluate if he is a candidate for carotid / vertebral arteries stent placement for the significant stenosis.   As clinically indicated, can proceed with planned cardiac cath. No definite contraindication from neurology stand point to go ahead with cardiac cath procedure. Monitor BP closely, and prevent hypotension below SBP 120 to reduce risk for stroke, due to carotid and vertebral artery stenosis.   Please call for any further questions.

## 2015-09-02 NOTE — Evaluation (Signed)
Physical Therapy Evaluation Patient Details Name: Henry [name] MRN: [id number] DOB: 02-25-1941 Today's Date: 09/02/2015   History of Present Illness  Henry [name] is a 75 y.o. male with PMH of ASCVD status post CABG and subsequent stenting, COPD, hypercoagulable state on Xarelto, and chronic diastolic CHF who presents to the ED with acute onset of left-sided weakness and vision change. Pt with DM and R LE in ex fix and is NWB on R LE.  Clinical Impression  Pt con't to report of vision impairment out of R eye but it appears the L sided weakness has resolved for the most part. Pt to con't to benefit from SNF upon d/c to con't to progress mobility with RW as R LE remains NWB with ex fix.    Follow Up Recommendations SNF    Equipment Recommendations       Recommendations for Other Services       Precautions / Restrictions Precautions Precaution Comments: pt with R LE ex fix Restrictions Weight Bearing Restrictions: Yes RLE Weight Bearing: Non weight bearing      Mobility  Bed Mobility Overal bed mobility: Needs Assistance Bed Mobility: Supine to Sit     Supine to sit: Supervision     General bed mobility comments: pt used bed rail but was able to complete task without physical assist  Transfers Overall transfer level: Needs assistance Equipment used: Rolling walker (2 wheeled) Transfers: Sit to/from [facility] Sit to Stand: Min assist Stand pivot transfers: Min assist       General transfer comment: v/c's to push up with hands, pt able to hop on L foot and clear without difficulty but does report "it's hard to feel where my foot is"  Ambulation/Gait                Stairs            Wheelchair Mobility    Modified Rankin (Stroke Patients Only)       Balance Overall balance assessment: Needs assistance         Standing balance support: Bilateral upper extremity supported Standing balance-Leahy Scale: Fair Standing balance  comment: needs RW for standing due to R LE NWB                             Pertinent Vitals/Pain Pain Assessment: 0-10 Pain Score: 5  Pain Location: R foot Pain Descriptors / Indicators: Throbbing Pain Intervention(s): Repositioned    Home Living Family/patient expects to be discharged to:: Skilled nursing facility                 Additional Comments: was a [facility]    Prior Function Level of Independence: Needs assistance   Gait / Transfers Assistance Needed: pt was either using slide board or std pvt with RW to/from bed to/from chair. pt unable to ambulate since 05/2015  ADL's / Homemaking Assistance Needed: assist from staff        Hand Dominance        Extremity/Trunk Assessment   Upper Extremity Assessment: LUE deficits/detail       LUE Deficits / Details: mild deficit 4+/5 grossly, however able to safely use it to perform transfer to chair   Lower Extremity Assessment: RLE deficits/detail;LLE deficits/detail (h/o peripheral neuropathy) RLE Deficits / Details: ex fix, therefore no ankle mvmt, pt able to manage LE on/off bed without assist and maintain R LE NWB LLE Deficits / Details:  has unna boot, limited ankle ROM  Cervical / Trunk Assessment: Normal  Communication   Communication: No difficulties  Cognition Arousal/Alertness: Awake/alert Behavior During Therapy: WFL for tasks assessed/performed Overall Cognitive Status: Within Functional Limits for tasks assessed                      General Comments      Exercises        Assessment/Plan    PT Assessment Patient needs continued PT services  PT Diagnosis Difficulty walking;Generalized weakness   PT Problem List Decreased strength;Decreased range of motion;Decreased activity tolerance;Decreased balance;Decreased mobility  PT Treatment Interventions DME instruction;Gait training;Stair training;Functional mobility training;Therapeutic activities;Therapeutic  exercise;Balance training   PT Goals (Current goals can be found in the Care Plan section) Acute Rehab PT Goals Patient Stated Goal: back to rehab PT Goal Formulation: With patient Time For Goal Achievement: 09/16/15 Potential to Achieve Goals: Good    Frequency Min 2X/week   Barriers to discharge        Co-evaluation               End of Session Equipment Utilized During Treatment: Gait belt Activity Tolerance: Patient tolerated treatment well Patient left: in chair;with call bell/phone within reach;with family/visitor present Nurse Communication: Mobility status         Time: [id number] PT Time Calculation (min) (ACUTE ONLY): 23 min   Charges:   PT Evaluation $PT Eval Low Complexity: 1 Procedure PT Treatments $Therapeutic Activity: 8-22 mins   PT G CodesKingsley Callander 09/02/2015, 11:15 AM   Kittie Plater, PT, DPT Pager #: [phone] Office #: [phone]

## 2015-09-02 NOTE — Progress Notes (Signed)
[facility] PROGRESS NOTE  Henry [name] [id number] DOB: October 04, 1940 DOA: 08/30/2015 PCP: Sherrie Mustache, MD  HPI/Brief narrative 75 y.o. male with PMH of ASCVD status post CABG and subsequent stenting, COPD, hypercoagulable state on Xarelto, and chronic diastolic CHF who presents to the ED with acute onset of left-sided weakness and vision change. Patient was just discharged back to his SNF from this [facility] on 08/30/2015 following medical management of chest pain with elevated troponin.  Assessment/Plan: 1. TIA/CVA -Pt was recently discharged from on 08/30/2015 during which time he was evaluated for chest pain. Cardiology recommending continuing medical management.  - At a skilled nursing facility he was found by nursing staff to have left-sided facial droop. Patient reporting bilateral blurry vision  -He was brought back to the emergency department where initial CT scan of brain without contrast was negative for acute intracranial abnormality.  -Neurologic symptoms resolving by the time he reached the emergency room.  -He was placed on CVA protocol.  -He recently had a transthoracic echocardiogram which did not reveal intracardiac source of clot  -Carotid Dopplers showed >70% stenosis of right internal carotid artery -Repeat CT scan of the brain (since external fixator precludes ability to perform MRI of brain) did not show acute changes -Neurology has been consulted  2. Chest pain/history of coronary artery disease/ACS. -Pt with history of multivessel coronary artery disease status post coronary artery bypass grafting 3 in 2005. His last cardiac catheterization was performed in 2012 that showed occluded venous grafts as well as severe circumflex disease. Cardiology at the time recommended medical management due to diffuse disease with very small caliber vessels. -Patient was recently admitted for chest pain and evaluated by cardiology who recommended ongoing  medical management. His Ranexa was titrated up to 1000 mg by mouth twice a day -On 09/01/2015 he underwent stress test. Per Cardiology, patient was noted to have a new area of infarct likely corresponding to progression of LCX disease. Dr Harl Bowie recommending transfer to [facility] to be evaluated for Heart Cath. Xarelto was changed to IV Heparin. Patient is planned for heart cath on Monday.   3. History recurrent pulmonary embolism. -Continue anticoagulation per above  4. History of right Avulsion fracture  -He has an external fixator in place.  -No frank purulence noted at screw site -Consulted wound care, who recommended Orthopedic consult regarding dressing changes -Have discussed case with Dr. Ninfa Linden, who recommends xray of ankle - ordered. Dr. Ninfa Linden to evaluate patient  5. Dyslipidemia  -Continue Lipitor 80 mg by mouth daily   6. Hypertension  -Continue Imdur for 120 mg by mouth daily, verapamil 240 mg by mouth daily, metoprolol succinate 5 mg by mouth twice a day   7. Acute Kidney Injury -Labs showing creatinine of 1.65 on 08/31/2015 -Likely secondary to hypovolemia/prerenal azothemia.  -Lasix held as he was held as he was hydrated with NS -Cr had improved since admit  8. Acute Anemia -Labs showing Hg of 7.8 -Patient typed and crossed and is s/p 1 unit of PRBC  Code Status: full Family Communication: Pt in room, wife at bedside Disposition Plan: Unclear at this time   Consultants:  Cardiology  [place]  Neurology  Orthopedic Surgery  Procedures:    Antibiotics: Anti-infectives    None      HPI/Subjective: Still complains of mild L arm weakness  Objective: Filed Vitals:   09/02/15 0400 09/02/15 0427 09/02/15 0800 09/02/15 1342  BP: 107/60  120/73 76/48  Pulse:   77 63  Temp:  98.4 F (36.9 C)    TempSrc:  Oral    Resp:  13 10   Height:      Weight:  78.926 kg (174 lb)    SpO2:  96% 97% 96%    Intake/Output Summary (Last 24 hours) at 09/02/15  1435 Last data filed at 09/02/15 0400  Gross per 24 hour  Intake    920 ml  Output    375 ml  Net    545 ml   Filed Weights   08/31/15 0005 09/01/15 1800 09/02/15 0427  Weight: 78.291 kg (172 lb 9.6 oz) 74.345 kg (163 lb 14.4 oz) 78.926 kg (174 lb)    Exam:   General:  Awake, in nad  Cardiovascular: regular, s1, s2  Respiratory: normal resp effort, no wheezing  Abdomen: soft, nondistended  Musculoskeletal: perfused, no clubbing   Data Reviewed: Basic Metabolic Panel:  Recent Labs Lab 08/28/15 0437 08/30/15 2010 08/31/15 0722 09/01/15 0713 09/02/15 0803  NA 137 133* 135 136 137  K 4.5 4.9 4.6 4.4 4.3  CL 105 98* 103 102 102  CO2 23 24 26 27 26   GLUCOSE 130* 177* 131* 130* 104*  BUN 12 22* 24* 20 16  CREATININE 1.01 1.65* 1.32* 1.13 1.23  CALCIUM 8.7* 8.5* 8.6* 8.7* 9.0   Liver Function Tests:  Recent Labs Lab 08/30/15 2010  AST 25  ALT 15*  ALKPHOS 57  BILITOT 0.4  PROT 7.1  ALBUMIN 2.5*   No results for input(s): LIPASE, AMYLASE in the last 168 hours. No results for input(s): AMMONIA in the last 168 hours. CBC:  Recent Labs Lab 08/30/15 1032 08/30/15 2010 08/31/15 0722 09/01/15 0713 09/02/15 0803  WBC 10.0 17.6* 13.0* 10.8* 10.7*  NEUTROABS  --  10.9*  --   --   --   HGB 8.2* 8.3* 7.7* 7.8* 8.4*  HCT 26.3* 27.1* 24.6* 26.5* 28.3*  MCV 83.2 84.7 83.1 85.8 84.0  PLT 499* 585* 488* 566* 482*   Cardiac Enzymes:  Recent Labs Lab 08/28/15 0435 08/30/15 2010 08/31/15 0724 08/31/15 1250 09/01/15 0713  TROPONINI 0.18* 0.18* 0.48* 0.83* 0.42*   BNP (last 3 results)  Recent Labs  02/05/15 1735  BNP 120.1*    ProBNP (last 3 results) No results for input(s): PROBNP in the last 8760 hours.  CBG:  Recent Labs Lab 09/01/15 1112 09/01/15 1607 09/01/15 2044 09/02/15 0754 09/02/15 1126  GLUCAP 159* 107* 122* 95 93    Recent Results (from the past 240 hour(s))  MRSA PCR Screening     Status: Abnormal   Collection Time: 09/01/15   6:59 PM  Result Value Ref Range Status   MRSA by PCR POSITIVE (A) NEGATIVE Final    Comment:        The GeneXpert MRSA Assay (FDA approved for NASAL specimens only), is one component of a comprehensive MRSA colonization surveillance program. It is not intended to diagnose MRSA infection nor to guide or monitor treatment for MRSA infections. RESULT CALLED TO, READ BACK BY AND VERIFIED WITHMorton Stall RN 2259 09/01/15 A BROWNING      Studies: Ct Head Wo Contrast  09/01/2015  CLINICAL DATA:  Left facial droop.  Blurred vision 1 week ago EXAM: CT HEAD WITHOUT CONTRAST TECHNIQUE: Contiguous axial images were obtained from the base of the skull through the vertex without intravenous contrast. COMPARISON:  08/30/2015 FINDINGS: There is atrophy and chronic small vessel disease changes. No acute intracranial abnormality. Specifically, no hemorrhage, hydrocephalus, mass lesion, acute infarction,  or significant intracranial injury. No acute calvarial abnormality. Visualized paranasal sinuses and mastoids clear. Orbital soft tissues unremarkable. IMPRESSION: No acute intracranial abnormality. Atrophy, chronic microvascular disease. Electronically Signed   By: Rolm Baptise M.D.   On: 09/01/2015 10:30   US Carotid Bilateral  08/31/2015  CLINICAL DATA:  Stroke.  Carotid artery disease on recent CTA. EXAM: BILATERAL CAROTID DUPLEX ULTRASOUND TECHNIQUE: Pearline Cables scale imaging, color Doppler and duplex ultrasound were performed of bilateral carotid and vertebral arteries in the neck. COMPARISON:  07/17/2010 and CTA 08/30/2015 FINDINGS: Criteria: Quantification of carotid stenosis is based on velocity parameters that correlate the residual internal carotid diameter with NASCET-based stenosis levels, using the diameter of the distal internal carotid lumen as the denominator for stenosis measurement. The following velocity measurements were obtained: RIGHT ICA:  465 cm/sec CCA:  94 cm/sec SYSTOLIC ICA/CCA RATIO:  4.9  DIASTOLIC ICA/CCA RATIO:  22 ECA:  90 cm/sec LEFT ICA:  129 cm/sec CCA:  [id number] cm/sec SYSTOLIC ICA/CCA RATIO:  1.4 DIASTOLIC ICA/CCA RATIO:  1.6 ECA:  99 cm/sec RIGHT CAROTID ARTERY: Small amount of circumferential echogenic plaque in the distal common carotid artery. Circumferential plaque at the right carotid bulb. Echogenic plaque and critical stenosis in the proximal and mid right internal carotid artery. There is a markedly elevated peak systolic velocity measuring up to 465 cm/sec in the mid internal carotid artery with turbulent flow. Peak systolic velocity in the distal internal carotid artery is also elevated measuring up to 213 cm/sec. RIGHT VERTEBRAL ARTERY: Antegrade flow and normal waveform in the right vertebral artery. LEFT CAROTID ARTERY: Small amount of echogenic plaque throughout the left common carotid artery. Small amount of plaque in the left carotid bulb. Echogenic plaque in the proximal internal carotid artery. Peak systolic velocity in the internal carotid artery is mildly elevated, measuring up to 129 cm/sec. LEFT VERTEBRAL ARTERY:  Not confidently visualized. IMPRESSION: Atherosclerotic plaque in the right internal carotid artery with a critical stenosis. Estimated degree of stenosis is greater than 70% and suspect a near occlusion. Atherosclerotic plaque involving the left internal carotid artery. Estimated degree of stenosis in left internal carotid artery is less than 50%. Patent right vertebral artery. Left vertebral artery is not visualized. This vessel was better characterized on the recent CTA examination. These results will be called to the ordering clinician or representative by the Radiologist Assistant, and communication documented in the PACS or zVision Dashboard. Electronically Signed   By: Markus Daft M.D.   On: 08/31/2015 16:22   Nm Myocar Multi W/spect W/wall Motion / Ef  09/01/2015   ST segment depression was noted during stress. There is ST depression in inferior and  lateral precordial leads at baseline that is unchanged after injection  Findings consistent with prior inferior infarction with mild peri-infarct ischemia. There is evidence of inferolateral and lateral infarct with mild to moderate peri-infact ischemia.  The left ventricular ejection fraction is severely decreased (<30%).  This is a high risk study. High risk due to low ejection fraction and multiple perfusion defects. Of note recent echo shows LVEF 60-65%, suggesting probable nuclear technical issue with volume assessment.     Scheduled Meds: . aspirin  300 mg Rectal Daily   Or  . aspirin  325 mg Oral Daily  . atorvastatin  80 mg Oral Daily  . budesonide-formoterol  2 puff Inhalation BID  . Chlorhexidine Gluconate Cloth  6 each Topical Q0600  . docusate sodium  100 mg Oral BID  . fluticasone  1 spray  Each Nare Daily  . insulin aspart  0-15 Units Subcutaneous TID WC  . isosorbide mononitrate  120 mg Oral Daily  . lactulose  15 g Oral Daily  . metoprolol  75 mg Oral BID  . montelukast  10 mg Oral QHS  . mupirocin ointment  1 application Nasal BID  . omega-3 acid ethyl esters  2 g Oral q morning - 10a  . pantoprazole  40 mg Oral Daily  . polyethylene glycol  17 g Oral Daily  . ranolazine  1,000 mg Oral BID  . sodium chloride  1 spray Each Nare QID  . sodium chloride  250 mL Intravenous Once  . tiotropium  18 mcg Inhalation Daily  . verapamil  240 mg Oral Daily  . [START ON 09/04/2015] zinc oxide   Topical Q Mon  . zolpidem  5 mg Oral QHS   Continuous Infusions: . heparin 1,100 Units/hr (09/02/15 1143)    Principal Problem:   Stroke [facility]) Active Problems:   Essential hypertension, benign   Anemia   Type 2 diabetes mellitus (HCC)   Lupus anticoagulant positive   CKD (chronic kidney disease), stage II   COPD, moderate (HCC)   AKI (acute kidney injury) ([place])   Troponin level elevated   Atherosclerosis of native coronary artery of native heart with other form of angina  pectoris (HCC)   Chest pain   Thrombocytosis (HCC)   Hyponatremia   Chronic diastolic CHF (congestive heart failure) ([place])   Stroke (cerebrum) (HCC)   Atherosclerosis of native coronary artery of native heart   Acute ischemic stroke ([place])   ACS (acute coronary syndrome) ([place])   Ieasha Boerema, [place] [facility] Pager [phone]. If 7PM-7AM, please contact night-coverage at [email or web address], password [facility] 09/02/2015, 2:35 PM  LOS: 3 days

## 2015-09-03 LAB — BASIC METABOLIC PANEL
ANION GAP: 8 (ref 5–15)
BUN: 16 mg/dL (ref 6–20)
CALCIUM: 8.4 mg/dL — AB (ref 8.9–10.3)
CO2: 24 mmol/L (ref 22–32)
Chloride: 103 mmol/L (ref 101–111)
Creatinine, Ser: 1.22 mg/dL (ref 0.61–1.24)
GFR, EST NON AFRICAN AMERICAN: 56 mL/min — AB (ref >60)
Glucose, Bld: 123 mg/dL — ABNORMAL HIGH (ref 65–99)
POTASSIUM: 3.7 mmol/L (ref 3.5–5.1)
SODIUM: 135 mmol/L (ref 135–145)

## 2015-09-03 LAB — CBC
HCT: 27 % — ABNORMAL LOW (ref 39.0–52.0)
HEMOGLOBIN: 8 g/dL — AB (ref 13.0–17.0)
MCH: 24.8 pg — ABNORMAL LOW (ref 26.0–34.0)
MCHC: 29.6 g/dL — ABNORMAL LOW (ref 30.0–36.0)
MCV: 83.9 fL (ref 78.0–100.0)
Platelets: 489 10*3/uL — ABNORMAL HIGH (ref 150–400)
RBC: 3.22 MIL/uL — AB (ref 4.22–5.81)
RDW: 16.6 % — ABNORMAL HIGH (ref 11.5–15.5)
WBC: 10.8 10*3/uL — AB (ref 4.0–10.5)

## 2015-09-03 LAB — HEPARIN LEVEL (UNFRACTIONATED): HEPARIN UNFRACTIONATED: 1.4 [IU]/mL — AB (ref 0.30–0.70)

## 2015-09-03 LAB — GLUCOSE, CAPILLARY
GLUCOSE-CAPILLARY: 100 mg/dL — AB (ref 65–99)
GLUCOSE-CAPILLARY: 127 mg/dL — AB (ref 65–99)
GLUCOSE-CAPILLARY: 123 mg/dL — AB (ref 65–99)
Glucose-Capillary: 165 mg/dL — ABNORMAL HIGH (ref 65–99)

## 2015-09-03 LAB — PREPARE RBC (CROSSMATCH)

## 2015-09-03 LAB — APTT: aPTT: 72 seconds — ABNORMAL HIGH (ref 24–37)

## 2015-09-03 MED ORDER — SODIUM CHLORIDE 0.9 % IV SOLN: Freq: Once | INTRAVENOUS | Status: DC

## 2015-09-03 NOTE — Progress Notes (Signed)
Occupational Therapy Evaluation Patient Details Name: Henry [name] MRN: [id number] DOB: 1940-12-16 Today's Date: 09/03/2015    History of Present Illness Henry [name] is a 75 y.o. male with PMH of ASCVD status post CABG and subsequent stenting, COPD, hypercoagulable state on Xarelto, and chronic diastolic CHF who presents to the ED with acute onset of left-sided weakness and vision change. Pt with DM and R LE in ex fix and is NWB on R LE.   Clinical Impression   PTA, pt was at SNF for post-acute rehab stay and was learning to use slide board for transfers and required assistance for ADLs from staff. Pt presents with generalized weakness, acute RLE pain, and blurry vision. PT currently requires min assist for transfers and min-max assist for LB ADLs. Pt plans to d/c back to SNF for continued rehab stay. Will continue to follow acutely to address OT needs and goals.    Follow Up Recommendations  SNF;Supervision/Assistance - 24 hour    Equipment Recommendations  Other (comment) (TBD in next venue)    Recommendations for Other Services       Precautions / Restrictions Precautions Precaution Comments: pt with R LE ex fix Restrictions Weight Bearing Restrictions: Yes RLE Weight Bearing: Non weight bearing      Mobility Bed Mobility Overal bed mobility: Needs Assistance Bed Mobility: Supine to Sit;Sit to Supine     Supine to sit: Min guard Sit to supine: Min guard   General bed mobility comments: No physical assist required. Min guard for safety of external fixator. HOB flat, use of bedrail.  Transfers Overall transfer level: Needs assistance Equipment used: Rolling walker (2 wheeled) Transfers: Sit to/from Stand Sit to Stand: Min assist Stand pivot transfers: Min assist       General transfer comment: Min assist for balance - pt with slight L lateral and posterior lean. Verbal cues for safe hand placement and to locate surface behind him. Pt with good demonstration of  hop and clearing L foot    Balance Overall balance assessment: Needs assistance Sitting-balance support: No upper extremity supported;Feet supported Sitting balance-Leahy Scale: Fair     Standing balance support: Bilateral upper extremity supported;During functional activity Standing balance-Leahy Scale: Poor Standing balance comment: Requires UE support to maintain balance due to RLE NWB                            ADL Overall ADL's : Needs assistance/impaired                         Toilet Transfer: Minimal assistance;Stand-pivot;BSC;RW;Cueing for safety Toilet Transfer Details (indicate cue type and reason): Cues for safe hand placement Toileting- Clothing Manipulation and Hygiene: Maximal assistance;Sit to/from stand Toileting - Clothing Manipulation Details (indicate cue type and reason): For pericare and to move gown - requires bilateral UE support to maintain standing balance     Functional mobility during ADLs: Minimal assistance;Rolling walker General ADL Comments: Good adherence to NWB RLE status - alerted RN and NT that pt should be completing stand-pivot to [facility] instead of bedpan whenever possible.     Vision Vision Assessment?: Yes Eye Alignment: Within Functional Limits Ocular Range of Motion: Within Functional Limits Alignment/Gaze Preference: Within Defined Limits Tracking/Visual Pursuits: Decreased smoothness of horizontal tracking;Decreased smoothness of vertical tracking Saccades: Within functional limits Convergence: Within functional limits Visual Fields: No apparent deficits Additional Comments: Pt reports seeing lines and spots in R  superior quadrant and increased blurriness in all other quadrants especially eye R eye isolated.   Perception     Praxis      Pertinent Vitals/Pain Pain Assessment: 0-10 Pain Score: 4  Pain Location: R foot and hip Pain Descriptors / Indicators: Aching;Sore Pain Intervention(s): Limited activity  within patient's tolerance;Monitored during session;Repositioned     Hand Dominance Right   Extremity/Trunk Assessment Upper Extremity Assessment Upper Extremity Assessment: Generalized weakness   Lower Extremity Assessment Lower Extremity Assessment: RLE deficits/detail;LLE deficits/detail RLE Deficits / Details: ex fix, therefore no ankle mvmt, pt able to manage LE on/off bed without assist and maintain R LE NWB LLE Deficits / Details: has splint, limited ankle ROM LLE Sensation: history of peripheral neuropathy   Cervical / Trunk Assessment Cervical / Trunk Assessment: Normal   Communication Communication Communication: No difficulties   Cognition Arousal/Alertness: Awake/alert Behavior During Therapy: WFL for tasks assessed/performed Overall Cognitive Status: Within Functional Limits for tasks assessed                     General Comments       Exercises       Shoulder Instructions      Home Living Family/patient expects to be discharged to:: Skilled nursing facility                                 Additional Comments: was and will go back to [facility]      Prior Functioning/Environment Level of Independence: Needs assistance  Gait / Transfers Assistance Needed: pt was either using slide board or std pvt with RW to/from bed to/from chair. pt unable to ambulate since 05/2015 ADL's / Homemaking Assistance Needed: assist from staff        OT Diagnosis: Generalized weakness;Acute pain;Disturbance of vision   OT Problem List: Decreased strength;Decreased range of motion;Decreased activity tolerance;Impaired balance (sitting and/or standing);Impaired vision/perception;Decreased coordination;Decreased safety awareness;Decreased knowledge of use of DME or AE;Decreased knowledge of precautions;Pain   OT Treatment/Interventions: Self-care/ADL training;Therapeutic exercise;Energy conservation;DME and/or AE instruction;Therapeutic  activities;Visual/perceptual remediation/compensation;Patient/family education;Balance training    OT Goals(Current goals can be found in the care plan section) Acute Rehab OT Goals Patient Stated Goal: back to rehab OT Goal Formulation: With patient Time For Goal Achievement: 09/17/15 Potential to Achieve Goals: Good ADL Goals Pt Will Perform Grooming: with supervision;standing Pt Will Perform Lower Body Bathing: with supervision;sitting/lateral leans;sit to/from stand Pt Will Transfer to Toilet: with supervision;stand pivot transfer;bedside commode Pt Will Perform Toileting - Clothing Manipulation and hygiene: with supervision;sitting/lateral leans  OT Frequency: Min 2X/week   Barriers to D/C:            Co-evaluation              End of Session Equipment Utilized During Treatment: Gait belt;Rolling walker Nurse Communication: Mobility status  Activity Tolerance: Patient tolerated treatment well Patient left: in bed;with call bell/phone within reach;with bed alarm set;with family/visitor present   Time: [id number] OT Time Calculation (min): 40 min Charges:  OT General Charges $OT Visit: 1 Procedure OT Evaluation $OT Eval Moderate Complexity: 1 Procedure OT Treatments $Self Care/Home Management : 23-37 mins G-Codes:    Redmond Baseman, OTR/L Pager: [phone] 09/03/2015, 3:47 PM

## 2015-09-03 NOTE — Progress Notes (Signed)
Patient ID: Henry [name], male   DOB: 03-04-1941, 75 y.o.   MRN: [id number]    Subjective:    Slight chest tightness this am  Objective:   Temp:  [98 F (36.7 C)-99.6 F (37.6 C)] 99 F (37.2 C) (02/19 0404) Pulse Rate:  [59-86] 81 (02/19 0404) Resp:  [16] 16 (02/19 0404) BP: (76-114)/(47-64) 113/47 mmHg (02/19 0404) SpO2:  [91 %-98 %] 97 % (02/19 0404) Weight:  [166 lb 3.6 oz (75.4 kg)] 166 lb 3.6 oz (75.4 kg) (02/19 0404) Last BM Date: 08/31/15  Filed Weights   09/01/15 1800 09/02/15 0427 09/03/15 0404  Weight: 163 lb 14.4 oz (74.345 kg) 174 lb (78.926 kg) 166 lb 3.6 oz (75.4 kg)    Intake/Output Summary (Last 24 hours) at 09/03/15 0805 Last data filed at 09/03/15 0600  Gross per 24 hour  Intake    609 ml  Output    450 ml  Net    159 ml    Telemetry: NSR  Exam:  General: NAD  HEENT: sclera clear, throat clear  Resp: CTAB  Cardiac: RRR, no m/r/g, no jvd  GI: abdomen soft, NT, ND  MSK: no LE edema, 2+ FA pulse, no bruit  Neuro: no focal deficits  Psych: appropriate affect  Lab Results:  Basic Metabolic Panel:  Recent Labs Lab 09/01/15 0713 09/02/15 0803 09/03/15 0423  NA 136 137 135  K 4.4 4.3 3.7  CL 102 102 103  CO2 27 26 24   GLUCOSE 130* 104* 123*  BUN 20 16 16   CREATININE 1.13 1.23 1.22  CALCIUM 8.7* 9.0 8.4*    Liver Function Tests:  Recent Labs Lab 08/30/15 2010  AST 25  ALT 15*  ALKPHOS 57  BILITOT 0.4  PROT 7.1  ALBUMIN 2.5*    CBC:  Recent Labs Lab 09/01/15 0713 09/02/15 0803 09/03/15 0423  WBC 10.8* 10.7* 10.8*  HGB 7.8* 8.4* 8.0*  HCT 26.5* 28.3* 27.0*  MCV 85.8 84.0 83.9  PLT 566* 482* 489*    Cardiac Enzymes:  Recent Labs Lab 08/31/15 0724 08/31/15 1250 09/01/15 0713  TROPONINI 0.48* 0.83* 0.42*    BNP: No results for input(s): PROBNP in the last 8760 hours.  Coagulation:  Recent Labs Lab 08/27/15 1609 08/30/15 2010  INR 3.34* 3.28*    ECG:   Medications:   Scheduled  Medications: . aspirin  300 mg Rectal Daily   Or  . aspirin  325 mg Oral Daily  . atorvastatin  80 mg Oral Daily  . budesonide-formoterol  2 puff Inhalation BID  . Chlorhexidine Gluconate Cloth  6 each Topical Q0600  . docusate sodium  100 mg Oral BID  . fluticasone  1 spray Each Nare Daily  . insulin aspart  0-15 Units Subcutaneous TID WC  . isosorbide mononitrate  120 mg Oral Daily  . lactulose  15 g Oral Daily  . metoprolol  75 mg Oral BID  . montelukast  10 mg Oral QHS  . mupirocin ointment  1 application Nasal BID  . omega-3 acid ethyl esters  2 g Oral q morning - 10a  . pantoprazole  40 mg Oral Daily  . polyethylene glycol  17 g Oral Daily  . ranolazine  1,000 mg Oral BID  . sodium chloride  1 spray Each Nare QID  . tiotropium  18 mcg Inhalation Daily  . verapamil  240 mg Oral Daily  . [START ON 09/04/2015] zinc oxide   Topical Q Mon  . zolpidem  5  mg Oral QHS    Infusions: . heparin 1,200 Units/hr (09/02/15 2010)    PRN Medications: acetaminophen, albuterol, HYDROcodone-acetaminophen, morphine injection, nitroGLYCERIN, ondansetron (ZOFRAN) IV     Assessment/Plan    1. Chest pain/NSTEMI - somewhat atypical symptoms, however troponin peak to 0.8. Chronic ST/changes on EKG - Cath 2012 LM patent, LAD occluded, LCX with long stented proximal portion with sequential 40-50% stenosis, the more proximal of the 3 OMs is occluded. LIMA-LAD patent, SVG-RCA occluded, SVG-LCX occluded. - we initially tried to manage medically due to comorbidities and previous know coronary anatomy not amenable to revascurization. Pain continued and troponins continued to trend up, thus a nuclear scan was done to risk stratify and assess for new territories of ischemia.  - Nuclear stress shows inferior infact with mild peri-infarct ischemia, inferolateral/lateral infarct with mild to moderate peri-infarct ischemia.  - My suspicion is progression of the moderate proximal LCX disease, which unlike  the other known disease described as not amenable to revascularization may be something that could be intervened on. - recommend cath Monday, will need to have neuro input on recent TIA as well as ortho input on his external fixator and how DAPT may affect its management, in conversation with medicine team yesterday I have asked them to arrange these consults.   2. TIA - management per neuro, he does have evidence of cerebrovascular disease by imaging.   3. Anemia - management per primary team, if repeat troponin today pending up would recommend transfusion to Hgb of 9-10.   4. History of PE - he has history of clotting disorder on chronic xarelto, we have held this and started heparin in anticipation for cath. Will need to resume post cath.. - heparin not on MAR, patient received xarelto yesterday, per pharm to start today at 11AM.   5. Hypertension  - on several antihypertensives, with hypotension last night after medications administered.    Plan: Cath in am. ? Transfuse with anemia and ongoing angina.  Will hold Verapamil, and continue Lopressor and Imdur. Dr Harl Bowie to review this am.   Kerin Ransom PA-C 09/03/2015 8:10 AM  Patient seen and discussed with PA Rosalyn Gess, I agree with his documentation. Awaiting neuro evaluation of his TIA, pending recs we would like to pursue a cath tomorrow AM. From ortho standpoint he has been deemed ok for cath given his external fixator that is in place. Low bp's this AM likely medication related, we will hold verapamil.    Zandra Abts MD

## 2015-09-03 NOTE — Progress Notes (Signed)
[facility] PROGRESS NOTE  Henry [name] [id number] DOB: 25-Jul-1940 DOA: 08/30/2015 PCP: Sherrie Mustache, MD  HPI/Brief narrative 75 y.o. male with PMH of ASCVD status post CABG and subsequent stenting, COPD, hypercoagulable state on Xarelto, and chronic diastolic CHF who presents to the ED with acute onset of left-sided weakness and vision change. Patient was just discharged back to his SNF from this [facility] on 08/30/2015 following medical management of chest pain with elevated troponin.  Assessment/Plan: 1. TIA/CVA -Pt was recently discharged from on 08/30/2015 during which time he was evaluated for chest pain. Cardiology recommending continuing medical management.  - At a skilled nursing facility he was found by nursing staff to have left-sided facial droop. Patient reporting bilateral blurry vision  -He was brought back to the emergency department where initial CT scan of brain without contrast was negative for acute intracranial abnormality.  -Neurologic symptoms resolving by the time he reached the emergency room.  -He was placed on CVA protocol.  -He recently had a transthoracic echocardiogram which did not reveal intracardiac source of clot  -Carotid Dopplers showed >70% stenosis of right internal carotid artery -Repeat CT scan of the brain (since external fixator precludes ability to perform MRI of brain) did not show acute changes - Stable at present -Neurology has been consulted, awaiting input  2. Chest pain/history of coronary artery disease/ACS. -Pt with history of multivessel coronary artery disease status post coronary artery bypass grafting 3 in 2005. His last cardiac catheterization was performed in 2012 that showed occluded venous grafts as well as severe circumflex disease. Cardiology at the time recommended medical management due to diffuse disease with very small caliber vessels. -Patient was recently admitted for chest pain and evaluated by  cardiology who recommended ongoing medical management. His Ranexa was titrated up to 1000 mg by mouth twice a day -On 09/01/2015 he underwent stress test. Per Cardiology, patient was noted to have a new area of infarct likely corresponding to progression of LCX disease. Dr Harl Bowie recommending transfer to [facility] to be evaluated for Heart Cath. Xarelto was changed to IV Heparin. Patient is planned for heart cath tomorrow   3. History recurrent pulmonary embolism. -Continue anticoagulation per above  4. History of right Avulsion fracture  -He has an external fixator in place.  -No frank purulence noted at screw site -Consulted wound care, who recommended Orthopedic consult regarding dressing changes -Appreciate input by Dr. Ninfa Linden. Pt is cleared for surgery from Ortho standpoint with wound care recs noted  5. Dyslipidemia  -Continued Lipitor 80 mg by mouth daily   6. Hypertension  -Continue Imdur for 120 mg by mouth daily,metoprolol succinate 5 mg by mouth twice a day  -Verapamil d/c'd secondary to hypotension  7. Acute Kidney Injury -Labs showing creatinine of 1.65 on 08/31/2015 -Likely secondary to hypovolemia/prerenal azothemia.  -Lasix held as he was held as he was hydrated with NS -Cr had improved since admit  8. Acute Anemia -Patient is s/p 1 unit of PRBC on 2/17 -Goal to keep hgb 9-10.  Code Status: full Family Communication: Pt in room, wife at bedside Disposition Plan: Unclear at this time   Consultants:  Cardiology  [place]  Neurology  Orthopedic Surgery  Procedures:    Antibiotics: Anti-infectives    None      HPI/Subjective: No complaints this afternoon. Eager to have cath done  Objective: Filed Vitals:   09/03/15 0404 09/03/15 0821 09/03/15 1001 09/03/15 1315  BP: 113/47 136/68  98/54  Pulse: 81 80  73  Temp: 99 F (37.2 C) 98.4 F (36.9 C)  98.7 F (37.1 C)  TempSrc: Oral Axillary  Oral  Resp: 16     Height:      Weight: 75.4 kg (166 lb  3.6 oz)     SpO2: 97% 99% 95% 98%    Intake/Output Summary (Last 24 hours) at 09/03/15 1456 Last data filed at 09/03/15 0900  Gross per 24 hour  Intake    809 ml  Output    650 ml  Net    159 ml   Filed Weights   09/01/15 1800 09-08-15 0427 09/03/15 0404  Weight: 74.345 kg (163 lb 14.4 oz) 78.926 kg (174 lb) 75.4 kg (166 lb 3.6 oz)    Exam:   General:  Awake, in nad, laying in bed  Cardiovascular: regular, s1, s2  Respiratory: normal resp effort, no wheezing  Abdomen: soft, nondistended  Musculoskeletal: perfused, no clubbing, no cyanosis  Data Reviewed: Basic Metabolic Panel:  Recent Labs Lab 08/30/15 2010 08/31/15 0722 09/01/15 0713 08-Sep-2015 0803 09/03/15 0423  NA 133* 135 136 137 135  K 4.9 4.6 4.4 4.3 3.7  CL 98* 103 102 102 103  CO2 24 26 27 26 24   GLUCOSE 177* 131* 130* 104* 123*  BUN 22* 24* 20 16 16   CREATININE 1.65* 1.32* 1.13 1.23 1.22  CALCIUM 8.5* 8.6* 8.7* 9.0 8.4*   Liver Function Tests:  Recent Labs Lab 08/30/15 2010  AST 25  ALT 15*  ALKPHOS 57  BILITOT 0.4  PROT 7.1  ALBUMIN 2.5*   No results for input(s): LIPASE, AMYLASE in the last 168 hours. No results for input(s): AMMONIA in the last 168 hours. CBC:  Recent Labs Lab 08/30/15 2010 08/31/15 0722 09/01/15 0713 09/08/15 0803 09/03/15 0423  WBC 17.6* 13.0* 10.8* 10.7* 10.8*  NEUTROABS 10.9*  --   --   --   --   HGB 8.3* 7.7* 7.8* 8.4* 8.0*  HCT 27.1* 24.6* 26.5* 28.3* 27.0*  MCV 84.7 83.1 85.8 84.0 83.9  PLT 585* 488* 566* 482* 489*   Cardiac Enzymes:  Recent Labs Lab 08/28/15 0435 08/30/15 2010 08/31/15 0724 08/31/15 1250 09/01/15 0713  TROPONINI 0.18* 0.18* 0.48* 0.83* 0.42*   BNP (last 3 results)  Recent Labs  02/05/15 1735  BNP 120.1*    ProBNP (last 3 results) No results for input(s): PROBNP in the last 8760 hours.  CBG:  Recent Labs Lab 09-08-15 1126 09-08-2015 1632 2015-09-08 2146 09/03/15 0748 09/03/15 1153  GLUCAP 93 141* 180* 100* 127*     Recent Results (from the past 240 hour(s))  MRSA PCR Screening     Status: Abnormal   Collection Time: 09/01/15  6:59 PM  Result Value Ref Range Status   MRSA by PCR POSITIVE (A) NEGATIVE Final    Comment:        The GeneXpert MRSA Assay (FDA approved for NASAL specimens only), is one component of a comprehensive MRSA colonization surveillance program. It is not intended to diagnose MRSA infection nor to guide or monitor treatment for MRSA infections. RESULT CALLED TO, READ BACK BY AND VERIFIED WITHMorton Stall RN [id number] 09/01/15 A BROWNING      Studies: Dg Ankle 2 Views Right  Sep 08, 2015  CLINICAL DATA:  Ankle fracture. EXAM: RIGHT ANKLE - 2 VIEW COMPARISON:  02/05/2015 right tibia/fibula radiographs. FINDINGS: External fixation hardware overlies the right midfoot. No fracture, dislocation or suspicious focal osseous lesion is seen in the right ankle. There is diffuse osteopenia.  Vascular calcifications are seen throughout the soft tissues. Small Achilles right calcaneal spur. Staples overlie the posterior distal right lower extremity at the level of the distal right tibial shaft. IMPRESSION: External fixation hardware overlies the right midfoot. No fracture or dislocation is seen in the right ankle. Diffuse osteopenia. Electronically Signed   By: Ilona Sorrel M.D.   On: 09/02/2015 18:12    Scheduled Meds: . aspirin  300 mg Rectal Daily   Or  . aspirin  325 mg Oral Daily  . atorvastatin  80 mg Oral Daily  . budesonide-formoterol  2 puff Inhalation BID  . Chlorhexidine Gluconate Cloth  6 each Topical Q0600  . docusate sodium  100 mg Oral BID  . fluticasone  1 spray Each Nare Daily  . insulin aspart  0-15 Units Subcutaneous TID WC  . isosorbide mononitrate  120 mg Oral Daily  . lactulose  15 g Oral Daily  . metoprolol  75 mg Oral BID  . montelukast  10 mg Oral QHS  . mupirocin ointment  1 application Nasal BID  . omega-3 acid ethyl esters  2 g Oral q morning - 10a  .  pantoprazole  40 mg Oral Daily  . polyethylene glycol  17 g Oral Daily  . ranolazine  1,000 mg Oral BID  . sodium chloride  1 spray Each Nare QID  . tiotropium  18 mcg Inhalation Daily  . [START ON 09/04/2015] zinc oxide   Topical Q Mon  . zolpidem  5 mg Oral QHS   Continuous Infusions: . heparin 1,300 Units/hr (09/03/15 1130)    Principal Problem:   Stroke [facility]) Active Problems:   Essential hypertension, benign   Anemia   Type 2 diabetes mellitus (HCC)   Lupus anticoagulant positive   CKD (chronic kidney disease), stage II   COPD, moderate (HCC)   AKI (acute kidney injury) ([place])   Troponin level elevated   Atherosclerosis of native coronary artery of native heart with other form of angina pectoris (HCC)   Chest pain   Thrombocytosis (HCC)   Hyponatremia   Chronic diastolic CHF (congestive heart failure) (HCC)   Stroke (cerebrum) (HCC)   Atherosclerosis of native coronary artery of native heart   Acute ischemic stroke [facility])   ACS (acute coronary syndrome) ([place])   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 If 7PM-7AM, please contact night-coverage at [email or web address], password [facility] 09/03/2015, 2:56 PM  LOS: 4 days

## 2015-09-03 NOTE — Plan of Care (Signed)
Problem: Education: Goal: Knowledge of disease or condition will improve Outcome: Progressing Pt and wife aware pt has carotid artery stenosis. Pt and family aware of need to follow heart healthy carb mod diet

## 2015-09-03 NOTE — Progress Notes (Signed)
[place] for heparin Indication: chest pain/ACS  Allergies  Allergen Reactions  . Ace Inhibitors Cough    Cough  . Gabapentin Other (See Comments)    WAS BEHAVING VERY ODDLY    Patient Measurements: Height: 5\' 2"  (157.5 cm) Weight: 166 lb 3.6 oz (75.4 kg) IBW/kg (Calculated) : 54.6  Heparin dosing weight 78.3 kg   Vital Signs: Temp: 98.4 F (36.9 C) (02/19 0821) Temp Source: Axillary (02/19 0821) BP: 136/68 mmHg (02/19 0821) Pulse Rate: 80 (02/19 0821)  Labs:  Recent Labs  08/31/15 1250  09/01/15 0713 09/02/15 0803 09/02/15 1850 09/03/15 0423  HGB  --   < > 7.8* 8.4*  --  8.0*  HCT  --   --  26.5* 28.3*  --  27.0*  PLT  --   --  566* 482*  --  489*  APTT  --   --   --   --  58* 72*  HEPARINUNFRC  --   --   --   --  2.01* 1.40*  CREATININE  --   --  1.13 1.23  --  1.22  TROPONINI 0.83*  --  0.42*  --   --   --   < > = values in this interval not displayed.  Estimated Creatinine Clearance: 46.5 mL/min (by C-G formula based on Cr of 1.22).   Medications:  Prescriptions prior to admission  Medication Sig Dispense Refill Last Dose  . acetaminophen (TYLENOL) 325 MG tablet Take 650 mg by mouth every 4 (four) hours as needed for mild pain. For pain   08/22/2015 at Unknown time  . albuterol (PROAIR HFA) 108 (90 BASE) MCG/ACT inhaler Inhale 2 puffs into the lungs every 6 (six) hours as needed for wheezing or shortness of breath.    08/27/2015 at Unknown time  . atorvastatin (LIPITOR) 80 MG tablet Take 1 tablet (80 mg total) by mouth daily. 30 tablet 0 08/26/2015 at 1700  . budesonide-formoterol (SYMBICORT) 160-4.5 MCG/ACT inhaler Inhale 2 puffs into the lungs 2 (two) times daily.   08/27/2015 at Unknown time  . Coenzyme Q10 (CO Q 10) 100 MG CAPS Take 100 mg by mouth daily.   08/27/2015 at Unknown time  . docusate sodium (COLACE) 100 MG capsule Take 100 mg by mouth 2 (two) times daily.   08/27/2015 at Unknown time  . fish oil-omega-3 fatty  acids 1000 MG capsule Take 2 g by mouth every morning.    08/27/2015 at [place]  . fluticasone (FLONASE) 50 MCG/ACT nasal spray Place 1 spray into both nostrils daily.    08/29/2015 at Unknown time  . furosemide (LASIX) 20 MG tablet Take 20 mg by mouth daily.   08/27/2015 at Unknown time  . HYDROcodone-acetaminophen (NORCO) 7.5-325 MG tablet Take 1 tablet by mouth every 6 (six) hours as needed for moderate pain. (Patient taking differently: Take 1 tablet by mouth 2 (two) times daily. *May take every 4 hours as needed for pain) 15 tablet 0 08/29/2015 at Unknown time  . isosorbide mononitrate (IMDUR) 120 MG 24 hr tablet Take 120 mg by mouth daily.   08/27/2015 at Unknown time  . lactulose (CHRONULAC) 10 GM/15ML solution Take 15 g by mouth daily.    08/29/2015 at Unknown time  . losartan (COZAAR) 100 MG tablet Take 1 tablet (100 mg total) by mouth daily. 30 tablet 6 08/27/2015 at Unknown time  . metoprolol (LOPRESSOR) 50 MG tablet TAKE 1 AND 1/2 TABLETS BY MOUTH TWICE DAILY 90  tablet 2 08/27/2015 at [place]  . montelukast (SINGULAIR) 10 MG tablet Take 10 mg by mouth at bedtime.   08/26/2015 at Unknown time  . nitroGLYCERIN (NITROSTAT) 0.4 MG SL tablet Place 1 tablet (0.4 mg total) under the tongue every 5 (five) minutes x 3 doses as needed. For chest pain 25 tablet 3 08/27/2015 at Unknown time  . omeprazole (PRILOSEC) 40 MG capsule Take 40 mg by mouth every evening.   08/29/2015 at 600  . polyethylene glycol (MIRALAX / GLYCOLAX) packet Take 17 g by mouth daily.   08/27/2015 at Unknown time  . potassium chloride (K-DUR) 10 MEQ tablet Take 10 mEq by mouth every morning.   08/27/2015 at Unknown time  . rivaroxaban (XARELTO) 20 MG TABS tablet Take 20 mg by mouth daily.   08/27/2015 at [place]  . sitaGLIPtin (JANUVIA) 25 MG tablet Take 1 tablet (25 mg total) by mouth daily. 30 tablet 0 08/27/2015 at Unknown time  . sodium chloride (OCEAN) 0.65 % SOLN nasal spray Place 1 spray into both nostrils 4 (four) times daily.   08/29/2015 at  Unknown time  . tiotropium (SPIRIVA) 18 MCG inhalation capsule Place 1 capsule (18 mcg total) into inhaler and inhale daily. 30 capsule 5 08/27/2015 at Unknown time  . verapamil (CALAN-SR) 240 MG CR tablet TAKE 1 TABLET (240 MG TOTAL) BY MOUTH DAILY. 30 tablet 6 08/27/2015 at Unknown time  . zolpidem (AMBIEN) 10 MG tablet Take 10 mg by mouth at bedtime.  2 08/27/2015 at Unknown time  . chlorthalidone (HYGROTON) 25 MG tablet TAKE 1 TABLET BY MOUTH DAILY (Patient not taking: Reported on 08/30/2015) 30 tablet 6 Taking  . ranolazine (RANEXA) 1000 MG SR tablet Take 1 tablet (1,000 mg total) by mouth 2 (two) times daily. (Patient not taking: Reported on 08/30/2015) 60 tablet 1   . sertraline (ZOLOFT) 25 MG tablet Take 1 tablet (25 mg total) by mouth daily. (Patient not taking: Reported on 08/30/2015) 30 tablet 1     Assessment: 75 yo male on heparin for r/o ACS (noted on xarelto PTA for hx PE with lupus AC). With elevated aPTT at baseline will aim for the higher end of goal. -Heparin level=1.4 (influence of recent xarelto) and not reliable -aPTT= 72 and at goal  Goal of Therapy:  APTT 66-102 INR 2-3 Monitor platelets by anticoagulation protocol: Yes   Plan:  -Increase heparin to 1300 units/hr -Heparin level and CBC in am  Hildred Laser, Pharm D 09/03/2015 10:20 AM

## 2015-09-04 ENCOUNTER — Encounter (HOSPITAL_COMMUNITY)
Admission: EM | Disposition: A | Discharge status: Skilled Nursing Facility | Payer: Self-pay | Source: Home / Self Care | Attending: Internal Medicine

## 2015-09-04 DIAGNOSIS — I25111 Atherosclerotic heart disease of native coronary artery with angina pectoris with documented spasm: Secondary | ICD-10-CM

## 2015-09-04 DIAGNOSIS — I639 Cerebral infarction, unspecified: Secondary | ICD-10-CM

## 2015-09-04 LAB — CBC
HCT: 29.7 % — ABNORMAL LOW (ref 39.0–52.0)
Hemoglobin: 9.1 g/dL — ABNORMAL LOW (ref 13.0–17.0)
MCH: 25.7 pg — AB (ref 26.0–34.0)
MCHC: 30.6 g/dL (ref 30.0–36.0)
MCV: 83.9 fL (ref 78.0–100.0)
PLATELETS: 480 10*3/uL — AB (ref 150–400)
RBC: 3.54 MIL/uL — ABNORMAL LOW (ref 4.22–5.81)
RDW: 16.3 % — AB (ref 11.5–15.5)
WBC: 10.5 10*3/uL (ref 4.0–10.5)

## 2015-09-04 LAB — GLUCOSE, CAPILLARY
GLUCOSE-CAPILLARY: 119 mg/dL — AB (ref 65–99)
GLUCOSE-CAPILLARY: 115 mg/dL — AB (ref 65–99)
GLUCOSE-CAPILLARY: 157 mg/dL — AB (ref 65–99)

## 2015-09-04 LAB — BASIC METABOLIC PANEL
Anion gap: 10 (ref 5–15)
BUN: 13 mg/dL (ref 6–20)
CALCIUM: 8.9 mg/dL (ref 8.9–10.3)
CO2: 24 mmol/L (ref 22–32)
Chloride: 103 mmol/L (ref 101–111)
Creatinine, Ser: 1.06 mg/dL (ref 0.61–1.24)
GFR calc Af Amer: >60 mL/min (ref >60)
GLUCOSE: 119 mg/dL — AB (ref 65–99)
Potassium: 4 mmol/L (ref 3.5–5.1)
SODIUM: 137 mmol/L (ref 135–145)

## 2015-09-04 LAB — TYPE AND SCREEN
ABO/RH(D): A NEG
Antibody Screen: NEGATIVE
UNIT DIVISION: 0

## 2015-09-04 LAB — PROCALCITONIN: Procalcitonin: <0.1 ng/mL

## 2015-09-04 LAB — APTT
APTT: 73 s — AB (ref 24–37)
aPTT: 106 seconds — ABNORMAL HIGH (ref 24–37)

## 2015-09-04 LAB — HEPARIN LEVEL (UNFRACTIONATED): Heparin Unfractionated: 0.57 IU/mL (ref 0.30–0.70)

## 2015-09-04 SURGERY — LEFT HEART CATH AND CORS/GRAFTS ANGIOGRAPHY

## 2015-09-04 MED ORDER — SODIUM CHLORIDE 0.9 % WEIGHT BASED INFUSION
3.0000 mL/kg/h | INTRAVENOUS | Status: AC
  Administered 2015-09-05: 3 mL/kg/h via INTRAVENOUS

## 2015-09-04 MED ORDER — SODIUM CHLORIDE 0.9 % WEIGHT BASED INFUSION: 1.0000 mL/kg/h | INTRAVENOUS | Status: DC

## 2015-09-04 MED ORDER — ASPIRIN 81 MG PO CHEW: 81.0000 mg | CHEWABLE_TABLET | ORAL | Status: AC

## 2015-09-04 NOTE — Progress Notes (Signed)
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 Other hx includes COPD and Lupus. He was admitted via the ER with complaints of chest pain.  HGB was 9.4 then 8.6.  Also with anxiety.  chest pain at times occurs with emotional upset.   Subjective: Some chest pain during the night and SOB, now resolved  Objective: Vital signs in last 24 hours: Temp:  [97.8 F (36.6 C)-99 F (37.2 C)] 98.9 F (37.2 C) (02/20 0825) Pulse Rate:  [72-88] 84 (02/20 0825) Resp:  [14-18] 18 (02/20 0825) BP: (98-155)/(54-89) 155/74 mmHg (02/20 0825) SpO2:  [95 %-99 %] 99 % (02/20 0825) Weight:  [168 lb 3.2 oz (76.295 kg)] 168 lb 3.2 oz (76.295 kg) (02/20 0453) Weight change: 1 lb 15.6 oz (0.895 kg) Last BM Date: 09/03/15 Intake/Output from previous day: not accurate 02/19 0701 - 02/20 0700 In: 1020.3 [P.O.:440; I.V.:264.3; Blood:316] Out: 1425 [Urine:1425] Intake/Output this shift: Total I/O In: -  Out: 100 [Urine:100]  PE: General:Pleasant affect, NAD Skin:Warm and dry, brisk capillary refill HEENT:normocephalic, sclera clear, mucus membranes moist Heart:S1S2 RRR without murmur, gallup, rub or click Lungs:clear without rales, rhonchi, or wheezes [id number], non tender, + BS, do not palpate liver spleen or masses Ext:no lower ext edema, Rt lower leg in hardware Lt lower leg wrapped., 2+ radial pulses Neuro:alert and oriented, MAE, follows commands, + facial symmetry Tele:  SR with PVCs    Lab Results:  Recent Labs  09/03/15 0423 09/04/15 0140  WBC 10.8* 10.5  HGB 8.0* 9.1*  HCT 27.0* 29.7*  PLT 489* 480*   BMET  Recent Labs  09/03/15 0423 09/04/15 0140  NA 135 137  K 3.7 4.0  CL 103 103  CO2 24 24  GLUCOSE 123* 119*  BUN 16 13  CREATININE 1.22 1.06  CALCIUM 8.4* 8.9   No results for input(s): TROPONINI  in the last 72 hours.  Invalid input(s): CK, MB  Lab Results  Component Value Date   CHOL 93 08/31/2015   HDL 31* 08/31/2015   LDLCALC 45 08/31/2015   TRIG 87 08/31/2015   CHOLHDL 3.0 08/31/2015   Lab Results  Component Value Date   HGBA1C 6.4* 08/30/2015       Studies/Results: Dg Ankle 2 Views Right  09/02/2015  CLINICAL DATA:  Ankle fracture. EXAM: RIGHT ANKLE - 2 VIEW COMPARISON:  02/05/2015 right tibia/fibula radiographs. FINDINGS: External fixation hardware overlies the right midfoot. No fracture, dislocation or suspicious focal osseous lesion is seen in the right ankle. There is diffuse osteopenia. Vascular calcifications are seen throughout the soft tissues. Small Achilles right calcaneal spur. Staples overlie the posterior distal right lower extremity at the level of the distal right tibial shaft. IMPRESSION: External fixation hardware overlies the right midfoot. No fracture or dislocation is seen in the right ankle. Diffuse osteopenia. Electronically Signed   By: Ilona Sorrel M.D.   On: 09/02/2015 18:12    Medications: I have reviewed medications Scheduled Meds: . sodium chloride   Intravenous Once  . aspirin  300 mg Rectal Daily   Or  . aspirin  325 mg Oral Daily  . atorvastatin  80 mg Oral Daily  . budesonide-formoterol  2 puff Inhalation BID  . Chlorhexidine Gluconate Cloth  6 each Topical Q0600  . docusate sodium  100 mg Oral  BID  . fluticasone  1 spray Each Nare Daily  . insulin aspart  0-15 Units Subcutaneous TID WC  . isosorbide mononitrate  120 mg Oral Daily  . lactulose  15 g Oral Daily  . metoprolol  75 mg Oral BID  . montelukast  10 mg Oral QHS  . mupirocin ointment  1 application Nasal BID  . omega-3 acid ethyl esters  2 g Oral q morning - 10a  . pantoprazole  40 mg Oral Daily  . polyethylene glycol  17 g Oral Daily  . ranolazine  1,000 mg Oral BID  . sodium chloride  1 spray Each Nare QID  . tiotropium  18 mcg Inhalation Daily  . zinc oxide    Topical Q Mon  . zolpidem  5 mg Oral QHS   Continuous Infusions: . heparin 1,200 Units/hr (09/04/15 0627)   PRN Meds:.acetaminophen, albuterol, HYDROcodone-acetaminophen, morphine injection, nitroGLYCERIN, ondansetron (ZOFRAN) IV  Assessment/Plan: Principal Problem:   Stroke [facility]) Active Problems:   Essential hypertension, benign   Anemia   Type 2 diabetes mellitus (HCC)   Lupus anticoagulant positive   CKD (chronic kidney disease), stage II   COPD, moderate (HCC)   AKI (acute kidney injury) (HCC)   Troponin level elevated   Atherosclerosis of native coronary artery of native heart with other form of angina pectoris (HCC)   Chest pain   Thrombocytosis (HCC)   Hyponatremia   Chronic diastolic CHF (congestive heart failure) (HCC)   Stroke (cerebrum) (HCC)   Atherosclerosis of native coronary artery of native heart   Acute ischemic stroke [facility])   ACS (acute coronary syndrome) ([place])   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 Chest pain/NSTEMI - somewhat atypical symptoms, however troponin peak to 0.8. Chronic ST/changes on EKG - Cath 2012 LM patent, LAD occluded, LCX with long stented proximal portion with sequential 40-50% stenosis, the more proximal of the 3 OMs is occluded. LIMA-LAD patent, SVG-RCA occluded, SVG-LCX occluded. - we initially tried to manage medically due to comorbidities and previous know coronary anatomy not amenable to revascurization. Pain continued and troponins continued to trend up, thus a nuclear scan was done to risk stratify and assess for new territories of ischemia.  - Nuclear stress shows inferior infact with mild peri-infarct ischemia, inferolateral/lateral infarct with mild to moderate peri-infarct ischemia.  - Suspicion is progression of the moderate proximal LCX disease, which unlike the other known disease described as not amenable to revascularization may be something that could be intervened on. -  recommend cath Monday, will need to have neuro input on recent TIA as well as ortho input on his external fixator and how DAPT may affect its management, in conversation with medicine team yesterday I have asked them to arrange these consults.  pt is NPO  2. TIA - management per neuro, he does have evidence of cerebrovascular disease by imaging.   3. Anemia - management per primary team, if repeat troponin today pending up would recommend transfusion to Hgb of 9-10. Today 9.1  4. History of PE - he has history of clotting disorder on chronic xarelto, we have held this and started heparin in anticipation for cath. Will need to resume post cath.. - heparin not on MAR, patient received xarelto yesterday, per pharm to start today at 11AM.   5. Hypertension  - on several antihypertensives, with hypotension last night after medications administered.  Now 136/71 to 155/74   Plan: Cath in today ? Transfuse with  anemia and ongoing angina. Will hold Verapamil, and continue Lopressor and Imdur.\We were awaiting neuro consult- has not yet been done.  Pt is NPO.    LOS: 5 days   Time spent with pt. :15 minutes. [facility] R  Nurse Practitioner Certified Pager [phone] or after 5pm and on weekends call [phone] 09/04/2015, 8:37 AM  Attending Note:   The patient was seen and examined.  Agree with assessment and plan as noted above.  Changes made to the above note as needed.  Pt has known cad, presents with a nonstemi. Has been seen and cleared for cath  by neuro ( but there has been difficulty in getting the note into the chart )  Has also been seen and cleared for cath by ortho.  Will arrange for cath today . Clear liquids.   Thayer Headings, Brooke Bonito., MD, [facility] 09/04/2015, 9:06 AM [address],  [place] Pager [phone]

## 2015-09-04 NOTE — Progress Notes (Signed)
[place] for heparin Indication: chest pain/ACS  Allergies  Allergen Reactions  . Ace Inhibitors Cough    Cough  . Gabapentin Other (See Comments)    WAS BEHAVING VERY ODDLY    Patient Measurements: Height: 5\' 2"  (157.5 cm) Weight: 166 lb 3.6 oz (75.4 kg) IBW/kg (Calculated) : 54.6  Heparin dosing weight 78.3 kg   Vital Signs: Temp: 99 F (37.2 C) (02/20 0007) Temp Source: Oral (02/20 0007) BP: 136/71 mmHg (02/20 0007) Pulse Rate: 74 (02/20 0007)  Labs:  Recent Labs  09/01/15 0713 09/02/15 0803 09/02/15 1850 09/03/15 0423 09/04/15 0140  HGB 7.8* 8.4*  --  8.0* 9.1*  HCT 26.5* 28.3*  --  27.0* 29.7*  PLT 566* 482*  --  489* 480*  APTT  --   --  58* 72* 106*  HEPARINUNFRC  --   --  2.01* 1.40* 0.57  CREATININE 1.13 1.23  --  1.22 1.06  TROPONINI 0.42*  --   --   --   --     Estimated Creatinine Clearance: 53.6 mL/min (by C-G formula based on Cr of 1.06).   Medications:  Prescriptions prior to admission  Medication Sig Dispense Refill Last Dose  . acetaminophen (TYLENOL) 325 MG tablet Take 650 mg by mouth every 4 (four) hours as needed for mild pain. For pain   08/22/2015 at Unknown time  . albuterol (PROAIR HFA) 108 (90 BASE) MCG/ACT inhaler Inhale 2 puffs into the lungs every 6 (six) hours as needed for wheezing or shortness of breath.    08/27/2015 at Unknown time  . atorvastatin (LIPITOR) 80 MG tablet Take 1 tablet (80 mg total) by mouth daily. 30 tablet 0 08/26/2015 at 1700  . budesonide-formoterol (SYMBICORT) 160-4.5 MCG/ACT inhaler Inhale 2 puffs into the lungs 2 (two) times daily.   08/27/2015 at Unknown time  . Coenzyme Q10 (CO Q 10) 100 MG CAPS Take 100 mg by mouth daily.   08/27/2015 at Unknown time  . docusate sodium (COLACE) 100 MG capsule Take 100 mg by mouth 2 (two) times daily.   08/27/2015 at Unknown time  . fish oil-omega-3 fatty acids 1000 MG capsule Take 2 g by mouth every morning.    08/27/2015 at [place]  .  fluticasone (FLONASE) 50 MCG/ACT nasal spray Place 1 spray into both nostrils daily.    08/29/2015 at Unknown time  . furosemide (LASIX) 20 MG tablet Take 20 mg by mouth daily.   08/27/2015 at Unknown time  . HYDROcodone-acetaminophen (NORCO) 7.5-325 MG tablet Take 1 tablet by mouth every 6 (six) hours as needed for moderate pain. (Patient taking differently: Take 1 tablet by mouth 2 (two) times daily. *May take every 4 hours as needed for pain) 15 tablet 0 08/29/2015 at Unknown time  . isosorbide mononitrate (IMDUR) 120 MG 24 hr tablet Take 120 mg by mouth daily.   08/27/2015 at Unknown time  . lactulose (CHRONULAC) 10 GM/15ML solution Take 15 g by mouth daily.    08/29/2015 at Unknown time  . losartan (COZAAR) 100 MG tablet Take 1 tablet (100 mg total) by mouth daily. 30 tablet 6 08/27/2015 at Unknown time  . metoprolol (LOPRESSOR) 50 MG tablet TAKE 1 AND 1/2 TABLETS BY MOUTH TWICE DAILY 90 tablet 2 08/27/2015 at 1000a  . montelukast (SINGULAIR) 10 MG tablet Take 10 mg by mouth at bedtime.   08/26/2015 at Unknown time  . nitroGLYCERIN (NITROSTAT) 0.4 MG SL tablet Place 1 tablet (0.4 mg total) under  the tongue every 5 (five) minutes x 3 doses as needed. For chest pain 25 tablet 3 08/27/2015 at Unknown time  . omeprazole (PRILOSEC) 40 MG capsule Take 40 mg by mouth every evening.   08/29/2015 at 600  . polyethylene glycol (MIRALAX / GLYCOLAX) packet Take 17 g by mouth daily.   08/27/2015 at Unknown time  . potassium chloride (K-DUR) 10 MEQ tablet Take 10 mEq by mouth every morning.   08/27/2015 at Unknown time  . rivaroxaban (XARELTO) 20 MG TABS tablet Take 20 mg by mouth daily.   08/27/2015 at [place]  . sitaGLIPtin (JANUVIA) 25 MG tablet Take 1 tablet (25 mg total) by mouth daily. 30 tablet 0 08/27/2015 at Unknown time  . sodium chloride (OCEAN) 0.65 % SOLN nasal spray Place 1 spray into both nostrils 4 (four) times daily.   08/29/2015 at Unknown time  . tiotropium (SPIRIVA) 18 MCG inhalation capsule Place 1 capsule  (18 mcg total) into inhaler and inhale daily. 30 capsule 5 08/27/2015 at Unknown time  . verapamil (CALAN-SR) 240 MG CR tablet TAKE 1 TABLET (240 MG TOTAL) BY MOUTH DAILY. 30 tablet 6 08/27/2015 at Unknown time  . zolpidem (AMBIEN) 10 MG tablet Take 10 mg by mouth at bedtime.  2 08/27/2015 at Unknown time  . chlorthalidone (HYGROTON) 25 MG tablet TAKE 1 TABLET BY MOUTH DAILY (Patient not taking: Reported on 08/30/2015) 30 tablet 6 Taking  . ranolazine (RANEXA) 1000 MG SR tablet Take 1 tablet (1,000 mg total) by mouth 2 (two) times daily. (Patient not taking: Reported on 08/30/2015) 60 tablet 1   . sertraline (ZOLOFT) 25 MG tablet Take 1 tablet (25 mg total) by mouth daily. (Patient not taking: Reported on 08/30/2015) 30 tablet 1     Assessment: 75 yo male on heparin for r/o ACS (noted on xarelto PTA for hx PE with lupus AC). With elevated aPTT at baseline will aim for the higher end of goal. -Heparin level=1.4 (influence of recent xarelto) and not reliable -aPTT= 72 and at goal  Repeat aPTT is now supratherapeutic at 106 seconds on heparin 1300 units/hr. Nurse reports no issues with infusion or bleeding. Will drop back to last therapeutic dose.  Goal of Therapy:  APTT 66-102 INR 2-3 Monitor platelets by anticoagulation protocol: Yes   Plan:  -Decrease heparin to 1200 units/hr 8h aPTT Daily aPTT/HL Monitor s/sx of bleeding   Andrey Cota. Diona Foley, PharmD, BCPS Clinical Pharmacist Pager [phone] 09/04/2015 2:36 AM

## 2015-09-04 NOTE — Progress Notes (Signed)
[facility] PROGRESS NOTE  Henry [name] [id number] DOB: Jul 02, 1941 DOA: 08/30/2015 PCP: Sherrie Mustache, MD  HPI/Brief narrative 75 y.o. male with PMH of ASCVD status post CABG and subsequent stenting, COPD, hypercoagulable state on Xarelto, and chronic diastolic CHF who presents to the ED with acute onset of left-sided weakness and vision change. Patient was just discharged back to his SNF from this [facility] on 08/30/2015 following medical management of chest pain with elevated troponin.  Assessment/Plan: 1. TIA/CVA -Pt was recently discharged from on 08/30/2015 during which time he was evaluated for chest pain. Cardiology recommending continuing medical management.  - At a skilled nursing facility he was found by nursing staff to have left-sided facial droop. Patient reporting bilateral blurry vision  -He was brought back to the emergency department where initial CT scan of brain without contrast was negative for acute intracranial abnormality.  -Neurologic symptoms resolving by the time he reached the emergency room.  -He was placed on CVA protocol.  -He recently had a transthoracic echocardiogram which did not reveal intracardiac source of clot  -Carotid Dopplers showed >70% stenosis of right internal carotid artery -Repeat CT scan of the brain (since external fixator precludes ability to perform MRI of brain) did not show acute changes - Stable at present -Neurology has been consulted, input from 2/18 now available. Discussed with Neurology today who will see in follow-up. For now, had discussed with Cardiology who will defer cath until tomorrow, pending input by Neruology  2. Chest pain/history of coronary artery disease/ACS. -Pt with history of multivessel coronary artery disease status post coronary artery bypass grafting 3 in 2005. His last cardiac catheterization was performed in 2012 that showed occluded venous grafts as well as severe circumflex disease.  Cardiology at the time recommended medical management due to diffuse disease with very small caliber vessels. -Patient was recently admitted for chest pain and evaluated by cardiology who recommended ongoing medical management. His Ranexa was titrated up to 1000 mg by mouth twice a day -On 09/01/2015 he underwent stress test. Per Cardiology, patient was noted to have a new area of infarct likely corresponding to progression of LCX disease. Xarelto was changed to IV Heparin. Patient originally planned for cath today, now deferred to tomorrow pending Neurology follow up today  3. History recurrent pulmonary embolism. -Continue anticoagulation per above  4. History of right Avulsion fracture  -He has an external fixator in place.  -No frank purulence noted at screw site -Consulted wound care, who recommended Orthopedic consult regarding dressing changes -Appreciate input by Dr. Ninfa Linden. Pt is cleared for surgery from Ortho standpoint with wound care recs noted  5. Dyslipidemia  -Continued Lipitor 80 mg by mouth daily   6. Hypertension  -Continue Imdur for 120 mg by mouth daily,metoprolol succinate 5 mg by mouth twice a day  -Verapamil d/c'd secondary to hypotension  7. Acute Kidney Injury -Labs showing creatinine of 1.65 on 08/31/2015 -Likely secondary to hypovolemia/prerenal azothemia.  -Lasix held as he was held as he was hydrated with NS -Cr had improved since admit  8. Acute Anemia -Patient is s/p 1 unit of PRBC on 2/17 -S/p another unit of PRBC on 2/19 for hgb 8.0 -Hgb stable -Goal to keep hgb 9-10.  Code Status: full Family Communication: Pt in room, wife at bedside Disposition Plan: Unclear at this time   Consultants:  Cardiology  [facility]  Neurology  Orthopedic Surgery  Procedures:    Antibiotics: Anti-infectives    None  HPI/Subjective: Eager for heart cath  Objective: Filed Vitals:   09/04/15 0453 09/04/15 0825 09/04/15 0900 09/04/15 1400   BP: 147/89 155/74 146/72 135/63  Pulse: 88 84  74  Temp: 99 F (37.2 C) 98.9 F (37.2 C)  98 F (36.7 C)  TempSrc: Oral Oral  Oral  Resp: 18 18  16   Height:      Weight: 76.295 kg (168 lb 3.2 oz)     SpO2: 96% 99%  100%    Intake/Output Summary (Last 24 hours) at 09/04/15 1538 Last data filed at 09/04/15 1405  Gross per 24 hour  Intake 1420.25 ml  Output   1326 ml  Net  94.25 ml   Filed Weights   09/30/15 0427 09/03/15 0404 09/04/15 0453  Weight: 78.926 kg (174 lb) 75.4 kg (166 lb 3.6 oz) 76.295 kg (168 lb 3.2 oz)    Exam:   General:  Awake, in nad, laying in bed  Cardiovascular: regular, s1, s2  Respiratory: normal resp effort, no wheezing  Abdomen: soft, nondistended, pos BS  Musculoskeletal: perfused, no clubbing, no cyanosis, fixator hardware in place  Data Reviewed: Basic Metabolic Panel:  Recent Labs Lab 08/31/15 0722 09/01/15 0713 30-Sep-2015 0803 09/03/15 0423 09/04/15 0140  NA 135 136 137 135 137  K 4.6 4.4 4.3 3.7 4.0  CL 103 102 102 103 103  CO2 26 27 26 24 24   GLUCOSE 131* 130* 104* 123* 119*  BUN 24* 20 16 16 13   CREATININE 1.32* 1.13 1.23 1.22 1.06  CALCIUM 8.6* 8.7* 9.0 8.4* 8.9   Liver Function Tests:  Recent Labs Lab 08/30/15 2010  AST 25  ALT 15*  ALKPHOS 57  BILITOT 0.4  PROT 7.1  ALBUMIN 2.5*   No results for input(s): LIPASE, AMYLASE in the last 168 hours. No results for input(s): AMMONIA in the last 168 hours. CBC:  Recent Labs Lab 08/30/15 2010 08/31/15 0722 09/01/15 0713 Sep 30, 2015 0803 09/03/15 0423 09/04/15 0140  WBC 17.6* 13.0* 10.8* 10.7* 10.8* 10.5  NEUTROABS 10.9*  --   --   --   --   --   HGB 8.3* 7.7* 7.8* 8.4* 8.0* 9.1*  HCT 27.1* 24.6* 26.5* 28.3* 27.0* 29.7*  MCV 84.7 83.1 85.8 84.0 83.9 83.9  PLT 585* 488* 566* 482* 489* 480*   Cardiac Enzymes:  Recent Labs Lab 08/30/15 2010 08/31/15 0724 08/31/15 1250 09/01/15 0713  TROPONINI 0.18* 0.48* 0.83* 0.42*   BNP (last 3 results)  Recent  Labs  02/05/15 1735  BNP 120.1*    ProBNP (last 3 results) No results for input(s): PROBNP in the last 8760 hours.  CBG:  Recent Labs Lab 09/03/15 1153 09/03/15 1623 09/03/15 2116 09/04/15 0725 09/04/15 1132  GLUCAP 127* 123* 165* 119* 115*    Recent Results (from the past 240 hour(s))  MRSA PCR Screening     Status: Abnormal   Collection Time: 09/01/15  6:59 PM  Result Value Ref Range Status   MRSA by PCR POSITIVE (A) NEGATIVE Final    Comment:        The GeneXpert MRSA Assay (FDA approved for NASAL specimens only), is one component of a comprehensive MRSA colonization surveillance program. It is not intended to diagnose MRSA infection nor to guide or monitor treatment for MRSA infections. RESULT CALLED TO, READ BACK BY AND VERIFIED WITHMorton Stall RN [id number] 09/01/15 A BROWNING      Studies: Dg Ankle 2 Views Right  2015/09/30  CLINICAL DATA:  Ankle fracture. EXAM:  RIGHT ANKLE - 2 VIEW COMPARISON:  02/05/2015 right tibia/fibula radiographs. FINDINGS: External fixation hardware overlies the right midfoot. No fracture, dislocation or suspicious focal osseous lesion is seen in the right ankle. There is diffuse osteopenia. Vascular calcifications are seen throughout the soft tissues. Small Achilles right calcaneal spur. Staples overlie the posterior distal right lower extremity at the level of the distal right tibial shaft. IMPRESSION: External fixation hardware overlies the right midfoot. No fracture or dislocation is seen in the right ankle. Diffuse osteopenia. Electronically Signed   By: Ilona Sorrel M.D.   On: 09/02/2015 18:12    Scheduled Meds: . sodium chloride   Intravenous Once  . aspirin  300 mg Rectal Daily   Or  . aspirin  325 mg Oral Daily  . atorvastatin  80 mg Oral Daily  . budesonide-formoterol  2 puff Inhalation BID  . Chlorhexidine Gluconate Cloth  6 each Topical Q0600  . docusate sodium  100 mg Oral BID  . fluticasone  1 spray Each Nare Daily  .  insulin aspart  0-15 Units Subcutaneous TID WC  . isosorbide mononitrate  120 mg Oral Daily  . lactulose  15 g Oral Daily  . metoprolol  75 mg Oral BID  . montelukast  10 mg Oral QHS  . mupirocin ointment  1 application Nasal BID  . omega-3 acid ethyl esters  2 g Oral q morning - 10a  . pantoprazole  40 mg Oral Daily  . polyethylene glycol  17 g Oral Daily  . ranolazine  1,000 mg Oral BID  . sodium chloride  1 spray Each Nare QID  . tiotropium  18 mcg Inhalation Daily  . zinc oxide   Topical Q Mon  . zolpidem  5 mg Oral QHS   Continuous Infusions: . sodium chloride    . heparin 1,200 Units/hr (09/04/15 [id number])    Principal Problem:   Stroke [facility]) Active Problems:   Essential hypertension, benign   Anemia   Type 2 diabetes mellitus (HCC)   Lupus anticoagulant positive   CKD (chronic kidney disease), stage II   COPD, moderate (HCC)   AKI (acute kidney injury) ([place])   Troponin level elevated   Atherosclerosis of native coronary artery of native heart with other form of angina pectoris (HCC)   Chest pain   Thrombocytosis (HCC)   Hyponatremia   Chronic diastolic CHF (congestive heart failure) (HCC)   Stroke (cerebrum) (HCC)   Atherosclerosis of native coronary artery of native heart   Acute ischemic stroke [facility])   ACS (acute coronary syndrome) ([place])   Cerebrovascular accident (CVA) due to bilateral stenosis of carotid arteries ([place])   Asyah Candler, [facility] Pager [phone]. If 7PM-7AM, please contact night-coverage at [email or web address], password [facility] 09/04/2015, 3:38 PM  LOS: 5 days

## 2015-09-04 NOTE — Consult Note (Signed)
.  WOC wound follow up Wound type: venous stasis Measurement: left lateral 2.5cm x 3.5cm x 0.2cm; left posterior 2cm x 1cm x 0.1cm  Wound [id number] lateral: 100% pink; left posterior; scabbed  Drainage (amount, consistency, odor) dark, brown, no odor Periwound: intact, no edema Dressing procedure/placement/frequency: Add silver hydrofiber with foam for exudate management under Unna's boot.  Discussed POC with patient and bedside nurse.  Re consult if needed, will not follow at this time. Thanks  Zahrah Sutherlin Kellogg, [place] [phone])

## 2015-09-04 NOTE — Progress Notes (Signed)
ANTICOAGULATION CONSULT NOTE  --  FOLLOW-UP  Pharmacy Consult for heparin Indication: chest pain/ACS  Allergies  Allergen Reactions  . Ace Inhibitors Cough    Cough  . Gabapentin Other (See Comments)    WAS BEHAVING VERY ODDLY    Patient Measurements: Height: 5\' 2"  (157.5 cm) Weight: 168 lb 3.2 oz (76.295 kg) IBW/kg (Calculated) : 54.6  Heparin dosing weight 78.3 kg   Vital Signs: Temp: 98.9 F (37.2 C) (02/20 0825) Temp Source: Oral (02/20 0825) BP: 146/72 mmHg (02/20 0900) Pulse Rate: 84 (02/20 0825)  Labs:  Recent Labs  09/02/15 0803  09/02/15 1850 09/03/15 0423 09/04/15 0140 09/04/15 1110  HGB 8.4*  --   --  8.0* 9.1*  --   HCT 28.3*  --   --  27.0* 29.7*  --   PLT 482*  --   --  489* 480*  --   APTT  --   < > 58* 72* 106* 73*  HEPARINUNFRC  --   --  2.01* 1.40* 0.57  --   CREATININE 1.23  --   --  1.22 1.06  --   < > = values in this interval not displayed.  Estimated Creatinine Clearance: 53.9 mL/min (by C-G formula based on Cr of 1.06).   Medications:  Prescriptions prior to admission  Medication Sig Dispense Refill Last Dose  . acetaminophen (TYLENOL) 325 MG tablet Take 650 mg by mouth every 4 (four) hours as needed for mild pain. For pain   08/22/2015 at Unknown time  . albuterol (PROAIR HFA) 108 (90 BASE) MCG/ACT inhaler Inhale 2 puffs into the lungs every 6 (six) hours as needed for wheezing or shortness of breath.    08/27/2015 at Unknown time  . atorvastatin (LIPITOR) 80 MG tablet Take 1 tablet (80 mg total) by mouth daily. 30 tablet 0 08/26/2015 at 1700  . budesonide-formoterol (SYMBICORT) 160-4.5 MCG/ACT inhaler Inhale 2 puffs into the lungs 2 (two) times daily.   08/27/2015 at Unknown time  . Coenzyme Q10 (CO Q 10) 100 MG CAPS Take 100 mg by mouth daily.   08/27/2015 at Unknown time  . docusate sodium (COLACE) 100 MG capsule Take 100 mg by mouth 2 (two) times daily.   08/27/2015 at Unknown time  . fish oil-omega-3 fatty acids 1000 MG capsule Take 2 g  by mouth every morning.    08/27/2015 at [place]  . fluticasone (FLONASE) 50 MCG/ACT nasal spray Place 1 spray into both nostrils daily.    08/29/2015 at Unknown time  . furosemide (LASIX) 20 MG tablet Take 20 mg by mouth daily.   08/27/2015 at Unknown time  . HYDROcodone-acetaminophen (NORCO) 7.5-325 MG tablet Take 1 tablet by mouth every 6 (six) hours as needed for moderate pain. (Patient taking differently: Take 1 tablet by mouth 2 (two) times daily. *May take every 4 hours as needed for pain) 15 tablet 0 08/29/2015 at Unknown time  . isosorbide mononitrate (IMDUR) 120 MG 24 hr tablet Take 120 mg by mouth daily.   08/27/2015 at Unknown time  . lactulose (CHRONULAC) 10 GM/15ML solution Take 15 g by mouth daily.    08/29/2015 at Unknown time  . losartan (COZAAR) 100 MG tablet Take 1 tablet (100 mg total) by mouth daily. 30 tablet 6 08/27/2015 at Unknown time  . metoprolol (LOPRESSOR) 50 MG tablet TAKE 1 AND 1/2 TABLETS BY MOUTH TWICE DAILY 90 tablet 2 08/27/2015 at 1000a  . montelukast (SINGULAIR) 10 MG tablet Take 10 mg by mouth at  bedtime.   08/26/2015 at Unknown time  . nitroGLYCERIN (NITROSTAT) 0.4 MG SL tablet Place 1 tablet (0.4 mg total) under the tongue every 5 (five) minutes x 3 doses as needed. For chest pain 25 tablet 3 08/27/2015 at Unknown time  . omeprazole (PRILOSEC) 40 MG capsule Take 40 mg by mouth every evening.   08/29/2015 at 600  . polyethylene glycol (MIRALAX / GLYCOLAX) packet Take 17 g by mouth daily.   08/27/2015 at Unknown time  . potassium chloride (K-DUR) 10 MEQ tablet Take 10 mEq by mouth every morning.   08/27/2015 at Unknown time  . rivaroxaban (XARELTO) 20 MG TABS tablet Take 20 mg by mouth daily.   08/27/2015 at [place]  . sitaGLIPtin (JANUVIA) 25 MG tablet Take 1 tablet (25 mg total) by mouth daily. 30 tablet 0 08/27/2015 at Unknown time  . sodium chloride (OCEAN) 0.65 % SOLN nasal spray Place 1 spray into both nostrils 4 (four) times daily.   08/29/2015 at Unknown time  . tiotropium  (SPIRIVA) 18 MCG inhalation capsule Place 1 capsule (18 mcg total) into inhaler and inhale daily. 30 capsule 5 08/27/2015 at Unknown time  . verapamil (CALAN-SR) 240 MG CR tablet TAKE 1 TABLET (240 MG TOTAL) BY MOUTH DAILY. 30 tablet 6 08/27/2015 at Unknown time  . zolpidem (AMBIEN) 10 MG tablet Take 10 mg by mouth at bedtime.  2 08/27/2015 at Unknown time  . chlorthalidone (HYGROTON) 25 MG tablet TAKE 1 TABLET BY MOUTH DAILY (Patient not taking: Reported on 08/30/2015) 30 tablet 6 Taking  . ranolazine (RANEXA) 1000 MG SR tablet Take 1 tablet (1,000 mg total) by mouth 2 (two) times daily. (Patient not taking: Reported on 08/30/2015) 60 tablet 1   . sertraline (ZOLOFT) 25 MG tablet Take 1 tablet (25 mg total) by mouth daily. (Patient not taking: Reported on 08/30/2015) 30 tablet 1     Assessment: 75 yo male on heparin for r/o ACS (noted on xarelto PTA for hx PE with lupus AC).  Continues on heparin with plans for cardiac cath Tuesday.  Note lower heparin level goals with recent TIA.  aPTT=73 on 1200 units/hr  Goal of Therapy:  aPTT 66-85 Heparin level 0.3-0.5 INR 2-3 Monitor platelets by anticoagulation protocol: Yes   Plan:  Continue heparin at 1200 units/hr Daily aPTT/HL Monitor s/sx of bleeding   [employer], Pharm.D., BCPS Clinical Pharmacist Pager [phone] 09/04/2015 1:02 PM

## 2015-09-04 NOTE — Evaluation (Signed)
Speech Language Pathology Evaluation Patient Details Name: Henry [name] MRN: [id number] DOB: 1941/03/09 Today's Date: 09/04/2015 Time:  -     Problem List:  Patient Active Problem List   Diagnosis Date Noted  . Cerebrovascular accident (CVA) due to bilateral stenosis of carotid arteries ([place])   . ACS (acute coronary syndrome) ([place])   . Thrombocytosis ([place]) 08/31/2015  . Hyponatremia 08/31/2015  . Chronic diastolic CHF (congestive heart failure) ([place]) 08/31/2015  . Stroke (cerebrum) ([place]) 08/31/2015  . Atherosclerosis of native coronary artery of native heart   . Acute ischemic stroke ([place])   . Stroke ([place]) 08/30/2015  . Pain in the chest   . HLD (hyperlipidemia)   . 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 Chest pain 08/27/2015  . Lactic acidosis   . Troponin level elevated   . Atherosclerosis of native coronary artery of native heart with other form of angina pectoris ([place])   . AKI (acute kidney injury) ([place]) 02/05/2015  . Elevated lactic acid level 02/05/2015  . History of gastrointestinal bleeding 02/05/2015  . Coronary atherosclerosis of native coronary artery   . COPD exacerbation ([place]) 04/12/2014  . COPD, moderate ([place]) 03/15/2014  . 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 Right carotid bruit 12/18/2013  . 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 History of pulmonary embolism   . Type 2 diabetes mellitus ([place])   . Pulmonary nodule   . Lupus anticoagulant positive   . Anemia 12/17/2013  . Long term current use of anticoagulant therapy 12/17/2011  . Dyslipidemia 12/14/2011  . Essential hypertension, benign 04/11/2009   Past Medical History:  Past Medical History  Diagnosis Date  . Coronary atherosclerosis of native coronary artery     a. NSTEMI 2005 s/p CABG (LIMA - LAD, SVG - OM, SVG - PDA). b. DES to Cx 02/2005. c. NSTEMI 07/2010 due to occ Cx - med rx. Known occluded vein grafts.  . Essential hypertension, benign    . Type 2 diabetes mellitus ([place])   . Dyslipidemia   . ACE inhibitor intolerance   . Asthma   . Recurrent pulmonary embolism ([place])     a. 01/2011. b. Bilateral PE 11/2011 - + lupus anticoagulant preliminary testing. Felt to require lifelong Coumadin.  . Pulmonary nodule     RML by CT 11/2011, stable compared to 2012.  Marland Kitchen Cholelithiasis   . Pulmonary HTN ([place])     Echo 11/2011 at time of PE. PA pressure not assessed on 03/2012 echo but RV was back to normal.  . Lupus anticoagulant positive   . GERD (gastroesophageal reflux disease)   . COPD (chronic obstructive pulmonary disease) ([place])   . OSA (obstructive sleep apnea)    Past Surgical History:  Past Surgical History  Procedure Laterality Date  . Coronary artery bypass graft  2005     LIMA to LAD, SVG to OM, SVG to RCA  . L4-l5 laminectomy    . Colonoscopy    . Esophagogastroduodenoscopy N/A 12/19/2013    Procedure: ESOPHAGOGASTRODUODENOSCOPY (EGD);  Surgeon: Lafayette Dragon, MD;  Location: [facility] ENDOSCOPY;  Service: Endoscopy;  Laterality: N/A;  . Kidney stone removed     HPI:  75 y.o. male with PMH of ASCVD status post CABG and subsequent stenting, COPD, hypercoagulable state on Xarelto, and chronic diastolic CHF who presents to the ED with acute onset of left-sided weakness and vision change. Patient was just discharged back to his SNF from this [facility]  on 08/30/2015 following medical management of chest pain with elevated troponin. CT head negative.    Assessment / Plan / Recommendation Clinical Impression  Pt demonstrates normal cognitive lingusitic function. No SLP f/u needed, will sign off.     SLP Assessment  Patient does not need any further Speech Lanaguage Pathology Services    Follow Up Recommendations       Frequency and Duration           SLP Evaluation Prior Functioning  Cognitive/Linguistic Baseline: Within functional limits   Cognition  Overall Cognitive Status: Within Functional Limits for tasks  assessed Arousal/Alertness: Awake/alert Orientation Level: Oriented X4    Comprehension  Auditory Comprehension Overall Auditory Comprehension: Appears within functional limits for tasks assessed    Expression Verbal Expression Overall Verbal Expression: Appears within functional limits for tasks assessed   Oral / Motor  Oral Motor/Sensory Function Overall Oral Motor/Sensory Function: Within functional limits Motor Speech Overall Motor Speech: Appears within functional limits for tasks assessed   GO                    Ermal Haberer, Katherene Ponto 09/04/2015, 3:36 PM

## 2015-09-04 NOTE — Care Management Note (Signed)
Case Management Note  Patient Details  Name: Henry [name] MRN: [id number] Date of Birth: [birth year]  Subjective/Objective: Pt transferred from [facility] for chest pain. IV heparin gtt and plan for cardiac cath 09-04-15. Pt is from [facility]                  Action/Plan: CSW will assist with disposition needs.    Expected Discharge Date:                  Expected Discharge Plan:  Skilled Nursing Facility  In-House Referral:  Clinical Social Work  Discharge planning Services  CM Consult  Post Acute Care Choice:  NA Choice offered to:  NA  DME Arranged:    DME Agency:     HH Arranged:    [place] Agency:     Status of Service:  In process, will continue to follow  Medicare Important Message Given:  Yes Date Medicare IM Given:    Medicare IM give by:    Date Additional Medicare IM Given:    Additional Medicare Important Message give by:     If discussed at [place] of Stay Meetings, dates discussed:    Additional Comments:  Bethena Roys, RN 09/04/2015, 4:00 PM

## 2015-09-04 NOTE — Consult Note (Signed)
WOC wound follow up Wound type: compression left leg.  Wears Unna's boot on the LLE, no open wounds. Changed Q Monday Ortho has addressed right leg fixator and given wound care orders for pin sites and incisions.   Dressing procedure/placement/frequency: Updated POC for Unna's boot change now that inpatient at [facility].  Orthopedic techs change Unna's boots on this campus.  Requested unit secretary to order [place]an for use.  Notified bedside nurse of updated orders, she will page ortho to change when supplies arrive to the floor.   Re consult if needed, will not follow at this time. Thanks  Henry [name], [place] [phone])

## 2015-09-04 NOTE — Clinical Social Work Note (Signed)
BSW intern met with patient wife at bedside. Patient wife confirmed that patient would be returning to [facility] once ready for discharge. BSW intern will continue to follow and assist as needed.  Raynelle Highland BSW Intern, [phone]

## 2015-09-05 ENCOUNTER — Encounter (HOSPITAL_COMMUNITY)
Admission: EM | Disposition: A | Discharge status: Skilled Nursing Facility | Payer: Self-pay | Source: Home / Self Care | Attending: Internal Medicine

## 2015-09-05 DIAGNOSIS — D508 Other iron deficiency anemias: Secondary | ICD-10-CM

## 2015-09-05 DIAGNOSIS — I25110 Atherosclerotic heart disease of native coronary artery with unstable angina pectoris: Secondary | ICD-10-CM

## 2015-09-05 HISTORY — PX: CARDIAC CATHETERIZATION: SHX172

## 2015-09-05 LAB — BASIC METABOLIC PANEL
ANION GAP: 10 (ref 5–15)
BUN: 10 mg/dL (ref 6–20)
CHLORIDE: 101 mmol/L (ref 101–111)
CO2: 26 mmol/L (ref 22–32)
CREATININE: 1.05 mg/dL (ref 0.61–1.24)
Calcium: 9.1 mg/dL (ref 8.9–10.3)
GFR calc non Af Amer: >60 mL/min (ref >60)
Glucose, Bld: 115 mg/dL — ABNORMAL HIGH (ref 65–99)
POTASSIUM: 4.3 mmol/L (ref 3.5–5.1)
SODIUM: 137 mmol/L (ref 135–145)

## 2015-09-05 LAB — CBC
HEMATOCRIT: 33.7 % — AB (ref 39.0–52.0)
HEMOGLOBIN: 10.5 g/dL — AB (ref 13.0–17.0)
MCH: 26.5 pg (ref 26.0–34.0)
MCHC: 31.2 g/dL (ref 30.0–36.0)
MCV: 85.1 fL (ref 78.0–100.0)
Platelets: 577 10*3/uL — ABNORMAL HIGH (ref 150–400)
RBC: 3.96 MIL/uL — AB (ref 4.22–5.81)
RDW: 16.9 % — ABNORMAL HIGH (ref 11.5–15.5)
WBC: 10.6 10*3/uL — ABNORMAL HIGH (ref 4.0–10.5)

## 2015-09-05 LAB — APTT: APTT: 64 s — AB (ref 24–37)

## 2015-09-05 LAB — GLUCOSE, CAPILLARY
GLUCOSE-CAPILLARY: 111 mg/dL — AB (ref 65–99)
GLUCOSE-CAPILLARY: 109 mg/dL — AB (ref 65–99)
GLUCOSE-CAPILLARY: 112 mg/dL — AB (ref 65–99)
GLUCOSE-CAPILLARY: 139 mg/dL — AB (ref 65–99)

## 2015-09-05 LAB — PROTIME-INR
INR: 1.27 (ref 0.00–1.49)
PROTHROMBIN TIME: 16 s — AB (ref 11.6–15.2)

## 2015-09-05 LAB — HEPARIN LEVEL (UNFRACTIONATED): HEPARIN UNFRACTIONATED: 0.28 [IU]/mL — AB (ref 0.30–0.70)

## 2015-09-05 SURGERY — LEFT HEART CATH AND CORS/GRAFTS ANGIOGRAPHY

## 2015-09-05 MED ORDER — SODIUM CHLORIDE 0.9% FLUSH
3.0000 mL | Freq: Two times a day (BID) | INTRAVENOUS | Status: DC
  Administered 2015-09-05: 3 mL via INTRAVENOUS

## 2015-09-05 MED ORDER — VERAPAMIL HCL 2.5 MG/ML IV SOLN
INTRAVENOUS | Status: DC | PRN
  Administered 2015-09-05: 10 mL via INTRA_ARTERIAL

## 2015-09-05 MED ORDER — HEPARIN (PORCINE) IN NACL 100-0.45 UNIT/ML-% IJ SOLN
1300.0000 [IU]/h | INTRAMUSCULAR | Status: DC
  Administered 2015-09-06: 1300 [IU]/h via INTRAVENOUS
  Filled 2015-09-05: qty 250

## 2015-09-05 MED ORDER — SODIUM CHLORIDE 0.9 % WEIGHT BASED INFUSION: 1.0000 mL/kg/h | INTRAVENOUS | Status: AC

## 2015-09-05 MED ORDER — FENTANYL CITRATE (PF) 100 MCG/2ML IJ SOLN
INTRAMUSCULAR | Status: AC
  Filled 2015-09-05: qty 2

## 2015-09-05 MED ORDER — SODIUM CHLORIDE 0.9 % IV SOLN: 250.0000 mL | INTRAVENOUS | Status: DC | PRN

## 2015-09-05 MED ORDER — SODIUM CHLORIDE 0.9% FLUSH: 3.0000 mL | INTRAVENOUS | Status: DC | PRN

## 2015-09-05 MED ORDER — SODIUM CHLORIDE 0.9 % WEIGHT BASED INFUSION: 3.0000 mL/kg/h | INTRAVENOUS | Status: DC

## 2015-09-05 MED ORDER — HEPARIN SODIUM (PORCINE) 1000 UNIT/ML IJ SOLN
INTRAMUSCULAR | Status: DC | PRN
Start: 2015-09-05 — End: 2015-09-05
  Administered 2015-09-05: 4000 [IU] via INTRAVENOUS

## 2015-09-05 MED ORDER — LIDOCAINE HCL (PF) 1 % IJ SOLN
INTRAMUSCULAR | Status: DC | PRN
  Administered 2015-09-05: 2 mL via INTRADERMAL

## 2015-09-05 MED ORDER — IOHEXOL 350 MG/ML SOLN
INTRAVENOUS | Status: DC | PRN
Start: 2015-09-05 — End: 2015-09-05
  Administered 2015-09-05: 80 mL via INTRA_ARTERIAL

## 2015-09-05 MED ORDER — HEPARIN (PORCINE) IN NACL 2-0.9 UNIT/ML-% IJ SOLN
INTRAMUSCULAR | Status: AC
  Filled 2015-09-05: qty 1500

## 2015-09-05 MED ORDER — LIDOCAINE HCL (PF) 1 % IJ SOLN
INTRAMUSCULAR | Status: DC | PRN
  Administered 2015-09-05: 2 mL

## 2015-09-05 MED ORDER — ASPIRIN 81 MG PO CHEW
81.0000 mg | CHEWABLE_TABLET | ORAL | Status: DC
Start: 2015-09-06 — End: 2015-09-05

## 2015-09-05 MED ORDER — LIDOCAINE HCL (PF) 1 % IJ SOLN
INTRAMUSCULAR | Status: AC
  Filled 2015-09-05: qty 30

## 2015-09-05 MED ORDER — HEPARIN SODIUM (PORCINE) 1000 UNIT/ML IJ SOLN
INTRAMUSCULAR | Status: AC
  Filled 2015-09-05: qty 1

## 2015-09-05 MED ORDER — SODIUM CHLORIDE 0.9% FLUSH
3.0000 mL | Freq: Two times a day (BID) | INTRAVENOUS | Status: DC
Start: 2015-09-05 — End: 2015-09-05

## 2015-09-05 MED ORDER — VERAPAMIL HCL 2.5 MG/ML IV SOLN
INTRAVENOUS | Status: AC
  Filled 2015-09-05: qty 2

## 2015-09-05 MED ORDER — SODIUM CHLORIDE 0.9 % WEIGHT BASED INFUSION: 1.0000 mL/kg/h | INTRAVENOUS | Status: DC

## 2015-09-05 MED ORDER — FENTANYL CITRATE (PF) 100 MCG/2ML IJ SOLN
INTRAMUSCULAR | Status: DC | PRN
  Administered 2015-09-05: 25 ug via INTRAVENOUS

## 2015-09-05 SURGICAL SUPPLY — 11 items
CATH INFINITI 5 FR IM (CATHETERS) ×3 IMPLANT
CATH INFINITI 5FR JL4 (CATHETERS) ×3 IMPLANT
CATH OPTITORQUE JACKY 4.0 5F (CATHETERS) ×6 IMPLANT
DEVICE RAD COMP TR BAND LRG (VASCULAR PRODUCTS) ×6 IMPLANT
GLIDESHEATH SLEND A-KIT 6F 22G (SHEATH) ×6 IMPLANT
HOVERMATT SINGLE USE (MISCELLANEOUS) ×3 IMPLANT
KIT HEART LEFT (KITS) ×3 IMPLANT
PACK CARDIAC CATHETERIZATION (CUSTOM PROCEDURE TRAY) ×3 IMPLANT
TRANSDUCER W/STOPCOCK (MISCELLANEOUS) ×3 IMPLANT
TUBING CIL FLEX 10 FLL-RA (TUBING) ×3 IMPLANT
WIRE SAFE-T 1.5MM-J .035X260CM (WIRE) ×6 IMPLANT

## 2015-09-05 NOTE — Progress Notes (Addendum)
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 Other hx includes COPD and Lupus. He was admitted via the ER with complaints of chest pain.  HGB was 9.4 then 8.6.  Also with anxiety.  chest pain at times occurs with emotional upset.   Subjective: Some chest pain/ tightness  during the night and SOB, now resolved  Objective: Vital signs in last 24 hours: Temp:  [98 F (36.7 C)-99 F (37.2 C)] 99 F (37.2 C) (02/21 0537) Pulse Rate:  [74-90] 84 (02/21 0537) Resp:  [16-18] 18 (02/20 2339) BP: (129-157)/(63-82) 157/82 mmHg (02/21 0537) SpO2:  [98 %-100 %] 99 % (02/21 0537) Weight:  [170 lb 4.8 oz (77.248 kg)] 170 lb 4.8 oz (77.248 kg) (02/21 0537) Weight change: 2 lb 1.6 oz (0.953 kg) Last BM Date: 09/03/15 Intake/Output from previous day: not accurate 02/20 0701 - 02/21 0700 In: 1494.6 [P.O.:1200; I.V.:294.6] Out: 1426 [Urine:1425; Stool:1] Intake/Output this shift: Total I/O In: 229 [I.V.:229] Out: -   PE: General:Pleasant affect, NAD Skin:Warm and dry, brisk capillary refill HEENT:normocephalic, sclera clear, mucus membranes moist Heart:S1S2 RRR without murmur, gallup, rub or click Lungs:clear without rales, rhonchi, or wheezes [id number], non tender, + BS, do not palpate liver spleen or masses Ext:no lower ext edema, Rt lower leg in hardware  Left lower leg wrapped., 2+ radial pulses Neuro:alert and oriented, MAE, follows commands, + facial symmetry Tele:  SR with PVCs    Lab Results:  Recent Labs  09/04/15 0140 09/05/15 0546  WBC 10.5 10.6*  HGB 9.1* 10.5*  HCT 29.7* 33.7*  PLT 480* 577*   BMET  Recent Labs  09/04/15 0140 09/05/15 0546  NA 137 137  K 4.0 4.3  CL 103 101  CO2 24 26  GLUCOSE 119* 115*  BUN 13 10  CREATININE 1.06 1.05  CALCIUM 8.9 9.1   No results for input(s):  TROPONINI in the last 72 hours.  Invalid input(s): CK, MB  Lab Results  Component Value Date   CHOL 93 08/31/2015   HDL 31* 08/31/2015   LDLCALC 45 08/31/2015   TRIG 87 08/31/2015   CHOLHDL 3.0 08/31/2015   Lab Results  Component Value Date   HGBA1C 6.4* 08/30/2015       Studies/Results: No results found.  Medications: I have reviewed medications Scheduled Meds: . sodium chloride   Intravenous Once  . aspirin  300 mg Rectal Daily   Or  . aspirin  325 mg Oral Daily  . atorvastatin  80 mg Oral Daily  . budesonide-formoterol  2 puff Inhalation BID  . Chlorhexidine Gluconate Cloth  6 each Topical Q0600  . docusate sodium  100 mg Oral BID  . fluticasone  1 spray Each Nare Daily  . insulin aspart  0-15 Units Subcutaneous TID WC  . isosorbide mononitrate  120 mg Oral Daily  . lactulose  15 g Oral Daily  . metoprolol  75 mg Oral BID  . montelukast  10 mg Oral QHS  . mupirocin ointment  1 application Nasal BID  . omega-3 acid ethyl esters  2 g Oral q morning - 10a  . pantoprazole  40 mg Oral Daily  . polyethylene glycol  17 g Oral Daily  . ranolazine  1,000 mg Oral BID  . sodium chloride  1 spray Each Nare QID  .  tiotropium  18 mcg Inhalation Daily  . zinc oxide   Topical Q Mon  . zolpidem  5 mg Oral QHS   Continuous Infusions: . sodium chloride 1 mL/kg/hr (09/05/15 0703)  . heparin 1,300 Units/hr (09/05/15 0744)   PRN Meds:.acetaminophen, albuterol, HYDROcodone-acetaminophen, morphine injection, nitroGLYCERIN, ondansetron (ZOFRAN) IV  Assessment/Plan: Principal Problem:   Stroke [facility]) Active Problems:   Essential hypertension, benign   Anemia   Type 2 diabetes mellitus (HCC)   Lupus anticoagulant positive   CKD (chronic kidney disease), stage II   COPD, moderate (HCC)   AKI (acute kidney injury) (HCC)   Troponin level elevated   Atherosclerosis of native coronary artery of native heart with other form of angina pectoris (HCC)   Chest pain   Thrombocytosis  (HCC)   Hyponatremia   Chronic diastolic CHF (congestive heart failure) (HCC)   Stroke (cerebrum) (HCC)   Atherosclerosis of native coronary artery of native heart   Acute ischemic stroke [facility])   ACS (acute coronary syndrome) ([place])   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 Chest pain/NSTEMI - somewhat atypical symptoms, however troponin peak to 0.8. Chronic ST/changes on EKG He has known cad and now has a myoview that shows some ischemia. We originally had planned on doing a cath but there was some discussion from the neuro doctors covering over the weekend that he was at increased risk for CVA if we proceed with cath. We have asked neuro to comment on this / clarify this issue. We could possibly cath him later today or tomorrow    2. TIA - management per neuro, he does have evidence of cerebrovascular disease by imaging.   3. Anemia - management per primary team, if repeat troponin today pending up would recommend transfusion to Hgb of 9-10. Today 9.1  4. History of PE - he has history of clotting disorder on chronic xarelto, we have held this and started heparin in anticipation for cath. Will need to resume post cath.. - heparin not on MAR, patient received xarelto yesterday, per pharm to start today at 11AM.   5. Hypertension  - on several antihypertensives, with hypotension last night after medications administered.  Now 136/71 to 155/74    Nahser, Wonda Cheng, MD  09/05/2015 8:33 AM    [facility] [place],  [place], [place]  [address] Pager [phone] Phone: [phone]; Fax: [phone]   Addendum:  Neuro has cleared him for cath although he is still at higher than normal risk for the procedure.   Given his presentation and + troponin levels, I think that we should proceed.     Nahser, Wonda Cheng, MD  09/05/2015 9:21 AM    [address],  [place], [place]  [address] Pager [phone] Phone: [phone]; Fax: [phone]

## 2015-09-05 NOTE — Progress Notes (Signed)
[facility] PROGRESS NOTE  KEVN TEATER [id number] DOB: 1941/05/13 DOA: 08/30/2015 PCP: Sherrie Mustache, MD  HPI/Brief narrative 75 y.o. male with PMH of ASCVD status post CABG and subsequent stenting, COPD, hypercoagulable state on Xarelto, and chronic diastolic CHF who presents to the ED with acute onset of left-sided weakness and vision change. Patient was just discharged back to his SNF from this [facility] on 08/30/2015 following medical management of chest pain with elevated troponin.  Assessment/Plan: 1. TIA/CVA -Pt was recently discharged from on 08/30/2015 during which time he was evaluated for chest pain. Cardiology recommending continuing medical management.  - At a skilled nursing facility he was found by nursing staff to have left-sided facial droop. Patient reported bilateral blurry vision  -Pt returned to the emergency department where initial CT scan of brain without contrast was negative for acute intracranial abnormality.  -Neurologic symptoms resolved prior to arrival to ED   -He recently had a transthoracic echocardiogram which did not reveal intracardiac source of clot  -Carotid Dopplers showed >70% stenosis of right internal carotid artery -Repeat CT scan of the brain (since external fixator precludes ability to perform MRI of brain) did not show acute changes -Neurology has been consulted, input from 2/18 now available. Appreciate follow up by Neurology today. Patient is considered high risk for cath, but has otherwise been cleared by Neurology for cath today  2. Chest pain/history of coronary artery disease/ACS. -Pt with history of multivessel coronary artery disease status post coronary artery bypass grafting 3 in 2005. His last cardiac catheterization was performed in 2012 that showed occluded venous grafts as well as severe circumflex disease. Cardiology at the time recommended medical management due to diffuse disease with very small caliber  vessels. -Patient was recently admitted for chest pain and evaluated by cardiology who recommended ongoing medical management. His Ranexa was titrated up to 1000 mg by mouth twice a day -On 09/01/2015 he underwent stress test. Per Cardiology, patient was noted to have a new area of infarct likely corresponding to progression of LCX disease. Xarelto was changed to IV Heparin.  - Patient for heart cath today  3. History recurrent pulmonary embolism. -Continue on anticoagulation per above  4. History of right Avulsion fracture  -He has an external fixator in place. Screws are magnetic thus not MRI candidate -No frank purulence noted at screw site -Consulted wound care, who recommended Orthopedic consult regarding dressing changes -Appreciate input by Dr. Ninfa Linden. Pt is cleared for surgery from Ortho standpoint with wound care recs noted  5. Dyslipidemia  -Continued Lipitor 80 mg by mouth daily   6. Hypertension  -Continue Imdur for 120 mg by mouth daily,metoprolol succinate 5 mg by mouth twice a day  -Verapamil d/c'd secondary to hypotension  7. Acute Kidney Injury -Labs showing creatinine of 1.65 on 08/31/2015 -Likely secondary to hypovolemia/prerenal azothemia.  -Lasix held as he was held as he was hydrated with NS -Cr had improved since admit  8. Acute Anemia -Patient is s/p 1 unit of PRBC on 2/17 -S/p another unit of PRBC on 2/19 for hgb 8.0 -Hgb stable at 10 this AM -Goal to keep hgb 9-10.  Code Status: full Family Communication: Pt in room, wife at bedside Disposition Plan: Uncertain at this time   Consultants:  Cardiology  [place]  Neurology  Orthopedic Surgery  Procedures:    Antibiotics: Anti-infectives    None      HPI/Subjective: No complaints this AM  Objective: Filed Vitals:   09/05/15 0537 09/05/15  0920 09/05/15 1100 09/05/15 1508  BP: 157/82  139/75   Pulse: 84 81 73   Temp: 99 F (37.2 C)  99 F (37.2 C)   TempSrc: Oral  Oral   Resp:   16 18   Height:      Weight: 77.248 kg (170 lb 4.8 oz)     SpO2: 99% 98% 97% 94%    Intake/Output Summary (Last 24 hours) at 09/05/15 1541 Last data filed at 09/05/15 0744  Gross per 24 hour  Intake 1003.6 ml  Output   1525 ml  Net -521.4 ml   Filed Weights   09/03/15 0404 09/04/15 0453 09/05/15 0537  Weight: 75.4 kg (166 lb 3.6 oz) 76.295 kg (168 lb 3.2 oz) 77.248 kg (170 lb 4.8 oz)    Exam:   General:  Awake, in nad  Cardiovascular: regular, s1, s2  Respiratory: normal resp effort, no wheezing  Abdomen: soft, nondistended, pos BS  Musculoskeletal: perfused, no cyanosis, fixator hardware remains in place  Data Reviewed: Basic Metabolic Panel:  Recent Labs Lab 09/01/15 0713 09/02/15 0803 09/03/15 0423 09/04/15 0140 09/05/15 0546  NA 136 137 135 137 137  K 4.4 4.3 3.7 4.0 4.3  CL 102 102 103 103 101  CO2 27 26 24 24 26   GLUCOSE 130* 104* 123* 119* 115*  BUN 20 16 16 13 10   CREATININE 1.13 1.23 1.22 1.06 1.05  CALCIUM 8.7* 9.0 8.4* 8.9 9.1   Liver Function Tests:  Recent Labs Lab 08/30/15 2010  AST 25  ALT 15*  ALKPHOS 57  BILITOT 0.4  PROT 7.1  ALBUMIN 2.5*   No results for input(s): LIPASE, AMYLASE in the last 168 hours. No results for input(s): AMMONIA in the last 168 hours. CBC:  Recent Labs Lab 08/30/15 2010  09/01/15 0713 09/02/15 0803 09/03/15 0423 09/04/15 0140 09/05/15 0546  WBC 17.6*  < > 10.8* 10.7* 10.8* 10.5 10.6*  NEUTROABS 10.9*  --   --   --   --   --   --   HGB 8.3*  < > 7.8* 8.4* 8.0* 9.1* 10.5*  HCT 27.1*  < > 26.5* 28.3* 27.0* 29.7* 33.7*  MCV 84.7  < > 85.8 84.0 83.9 83.9 85.1  PLT 585*  < > 566* 482* 489* 480* 577*  < > = values in this interval not displayed. Cardiac Enzymes:  Recent Labs Lab 08/30/15 2010 08/31/15 0724 08/31/15 1250 09/01/15 0713  TROPONINI 0.18* 0.48* 0.83* 0.42*   BNP (last 3 results)  Recent Labs  02/05/15 1735  BNP 120.1*    ProBNP (last 3 results) No results for input(s):  PROBNP in the last 8760 hours.  CBG:  Recent Labs Lab 09/04/15 0725 09/04/15 1132 09/04/15 1629 09/05/15 0802 09/05/15 1200  GLUCAP 119* 115* 157* 111* 109*    Recent Results (from the past 240 hour(s))  MRSA PCR Screening     Status: Abnormal   Collection Time: 09/01/15  6:59 PM  Result Value Ref Range Status   MRSA by PCR POSITIVE (A) NEGATIVE Final    Comment:        The GeneXpert MRSA Assay (FDA approved for NASAL specimens only), is one component of a comprehensive MRSA colonization surveillance program. It is not intended to diagnose MRSA infection nor to guide or monitor treatment for MRSA infections. RESULT CALLED TO, READ BACK BY AND VERIFIED WITHMorton Stall RN 2259 09/01/15 A BROWNING      Studies: No results found.  Scheduled Meds: . [  MAR Hold] sodium chloride   Intravenous Once  . [MAR Hold] aspirin  300 mg Rectal Daily   Or  . [MAR Hold] aspirin  325 mg Oral Daily  . [MAR Hold] atorvastatin  80 mg Oral Daily  . [MAR Hold] budesonide-formoterol  2 puff Inhalation BID  . [MAR Hold] Chlorhexidine Gluconate Cloth  6 each Topical Q0600  . [MAR Hold] docusate sodium  100 mg Oral BID  . [MAR Hold] fluticasone  1 spray Each Nare Daily  . [MAR Hold] insulin aspart  0-15 Units Subcutaneous TID WC  . [MAR Hold] isosorbide mononitrate  120 mg Oral Daily  . [MAR Hold] lactulose  15 g Oral Daily  . [MAR Hold] metoprolol  75 mg Oral BID  . [MAR Hold] montelukast  10 mg Oral QHS  . [MAR Hold] mupirocin ointment  1 application Nasal BID  . [MAR Hold] omega-3 acid ethyl esters  2 g Oral q morning - 10a  . [MAR Hold] pantoprazole  40 mg Oral Daily  . [MAR Hold] polyethylene glycol  17 g Oral Daily  . [MAR Hold] ranolazine  1,000 mg Oral BID  . [MAR Hold] sodium chloride  1 spray Each Nare QID  . sodium chloride flush  3 mL Intravenous Q12H  . [MAR Hold] tiotropium  18 mcg Inhalation Daily  . [MAR Hold] zinc oxide   Topical Q Mon  . [MAR Hold] zolpidem  5 mg Oral  QHS   Continuous Infusions: . sodium chloride 1 mL/kg/hr (09/05/15 0703)  . [START ON 09/06/2015] sodium chloride     Followed by  . [START ON 09/06/2015] sodium chloride    . heparin Stopped (09/05/15 1426)    Principal Problem:   Stroke [facility]) Active Problems:   Essential hypertension, benign   Anemia   Type 2 diabetes mellitus (HCC)   Lupus anticoagulant positive   CKD (chronic kidney disease), stage II   COPD, moderate (HCC)   AKI (acute kidney injury) ([place])   Atherosclerosis of native coronary artery with angina pectoris (HCC)   Troponin level elevated   Chest pain   Thrombocytosis (HCC)   Hyponatremia   Chronic diastolic CHF (congestive heart failure) (HCC)   Stroke (cerebrum) (HCC)   Acute ischemic stroke [facility])   ACS (acute coronary syndrome) ([place])   Cerebrovascular accident (CVA) due to bilateral stenosis of carotid arteries ([place])   Brylyn Novakovich, [place] [facility] Pager [phone]. If 7PM-7AM, please contact night-coverage at [email or web address], password [facility] 09/05/2015, 3:41 PM  LOS: 6 days

## 2015-09-05 NOTE — Care Management Important Message (Addendum)
Important Message  Patient Details  Name: Henry [name] MRN: [id number] Date of Birth: Dec 14, 1940   Medicare Important Message Given:  Yes    Erenest Rasher, RN 09/05/2015, 2:22 PM

## 2015-09-05 NOTE — Plan of Care (Signed)
Problem: Consults Goal: Cardiac Cath Patient Education (See Patient Education module for education specifics.) Outcome: Progressing Patient awaiting second neuro clearance consultation as requested per cardiology prior to cardiac catheterization given risk factors for complications; was previously cleared by neuro for procedure. Negative repeat CT of head; was unable to go for MRI related to external fixator in situ on right lower extremity due to previous Achilles tendon rupture. No neuro deficits noted on physical exam other than vision blurring, which has been previously noted at baseline. Patient states he has decent distant vision, but requires reading glasses for close vision. Problem appears transient with no report of hemianopia or visual field cuts; question if this could also be related to diabetes management (CBG has been anywhere from 90-180 mg/dL).  Did score higher on NIHSS this shift than has been previously documented related to subjective assessment of right lower extremity; some drift and ataxia due to trauma with external fixator in place.  Remains on IV heparin infusion without complaint of chest pain or other problems this shift. Vitals essentially OK with some labile BPs: Filed Vitals:    09/04/15 1600 09/04/15 1949 09/04/15 2100 09/04/15 2339  BP: 129/68 136/73   154/75  Pulse: 78 80   90  Temp: 98.7 F (37.1 C) 98.5 F (36.9 C)   98.3 F (36.8 C)  TempSrc: Oral Oral   Oral  Resp: 16 16   18   Height:          Weight:          SpO2: 98% 100% 99% 99%    Attempted to educate patient in detail regarding cardiac catheterization yesterday evening. He was not ready to receive anything other than a very basic verbal refresher related to his high anxiety at the time. Following medication with lyrica and ambien at [facility], patient was resting comfortably, arousable, but somewhat disoriented (again, not ready to receive education). He did not have any particular questions other  than related to when his procedure was scheduled to be performed today. I have not been able to answer this concern to his satisfaction as he has not yet been put on the procedure call list for cardiac catheterization. Will need follow-up education and ongoing re-assessment.  Continuing to monitor.

## 2015-09-05 NOTE — Plan of Care (Signed)
Problem: Phase I Progression Outcomes Goal: Pain controlled with appropriate interventions Outcome: Progressing Asses pain routine with VS and prn intervention

## 2015-09-05 NOTE — Care Management Note (Addendum)
Case Management Note  Patient Details  Name: Henry [name] MRN: [id number] Date of Birth: September 06, 1940  Subjective/Objective:      TIA vs Stroke, Anemia, hx right Avulsion Fracture has external fixator in place          Action/Plan: NCM spoke to pt and wife at bedside. Plan to dc back to [facility] to continue his rehab. CSW following for SNF.   Expected Discharge Date:  09/08/2015  ~             Expected Discharge Plan:  [place]  In-House Referral:  Clinical Social Work  Discharge planning Services  CM Consult  Post Acute Care Choice:  NA Choice offered to:  NA  DME Arranged:  N/A DME Agency:  NA  HH Arranged:  NA HH Agency:  NA  Status of Service:  Completed, signed off  Medicare Important Message Given:  Yes Date Medicare IM Given:    Medicare IM give by:    Date Additional Medicare IM Given:    Additional Medicare Important Message give by:     If discussed at [place] of Stay Meetings, dates discussed:    Additional Comments:  Erenest Rasher, RN 09/05/2015, 2:30 PM

## 2015-09-05 NOTE — Progress Notes (Signed)
ANTICOAGULATION CONSULT NOTE - Follow Up Consult  Pharmacy Consult for heparin Indication: h/o PE with lupus anticoag +  Allergies  Allergen Reactions  . Ace Inhibitors Cough    Cough  . Gabapentin Other (See Comments)    WAS BEHAVING VERY ODDLY    Patient Measurements: Height: 5\' 2"  (157.5 cm) Weight: 170 lb 4.8 oz (77.248 kg) IBW/kg (Calculated) : 54.6 Heparin Dosing Weight: 78.3 kg  Vital Signs: Temp: 98.6 F (37 C) (02/21 2000) Temp Source: Oral (02/21 2000) BP: 140/73 mmHg (02/21 2000) Pulse Rate: 89 (02/21 2000)  Labs:  Recent Labs  09/03/15 0423 09/04/15 0140 09/04/15 1110 09/05/15 0546 09/05/15 1336  HGB 8.0* 9.1*  --  10.5*  --   HCT 27.0* 29.7*  --  33.7*  --   PLT 489* 480*  --  577*  --   APTT 72* 106* 73* 64*  --   LABPROT  --   --   --   --  16.0*  INR  --   --   --   --  1.27  HEPARINUNFRC 1.40* 0.57  --  0.28*  --   CREATININE 1.22 1.06  --  1.05  --     Estimated Creatinine Clearance: 54.7 mL/min (by C-G formula based on Cr of 1.05).   Assessment:  Anticoagulation/heme: Heparin for r/o ACS (on 2/18). Xarelto PTA for hx PE with lupus AC (last dose 2/17 in am). (baseline aPTT= 49). Lupus AC complicates use of aPTT (see some management strategies on pubmed; PMID- [id number]).Lovenox may be a better option.Reduced goal HL 0.3-0.5; aPTT 66-85 - TR band should be completely off by 2200. Start IV heparin 6 hrs later.  Goal of Therapy:  HL 0.3-0.5 APTT 66-85 Monitor platelets by anticoagulation protocol: Yes   Plan:  Resume heparin at 0400 at 1300 units/hr - aPTT and HL 6 hrs after heparin in resumed. - f/u when to resume [place]. Alford Highland, PharmD, BCPS Clinical Staff Pharmacist Pager [phone]  Eilene Ghazi [place] 09/05/2015,8:49 PM

## 2015-09-05 NOTE — H&P (View-Only) (Signed)
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 Other hx includes COPD and Lupus. He was admitted via the ER with complaints of chest pain.  HGB was 9.4 then 8.6.  Also with anxiety.  chest pain at times occurs with emotional upset.   Subjective: Some chest pain/ tightness  during the night and SOB, now resolved  Objective: Vital signs in last 24 hours: Temp:  [98 F (36.7 C)-99 F (37.2 C)] 99 F (37.2 C) (02/21 0537) Pulse Rate:  [74-90] 84 (02/21 0537) Resp:  [16-18] 18 (02/20 2339) BP: (129-157)/(63-82) 157/82 mmHg (02/21 0537) SpO2:  [98 %-100 %] 99 % (02/21 0537) Weight:  [170 lb 4.8 oz (77.248 kg)] 170 lb 4.8 oz (77.248 kg) (02/21 0537) Weight change: 2 lb 1.6 oz (0.953 kg) Last BM Date: 09/03/15 Intake/Output from previous day: not accurate 02/20 0701 - 02/21 0700 In: 1494.6 [P.O.:1200; I.V.:294.6] Out: 1426 [Urine:1425; Stool:1] Intake/Output this shift: Total I/O In: 229 [I.V.:229] Out: -   PE: General:Pleasant affect, NAD Skin:Warm and dry, brisk capillary refill HEENT:normocephalic, sclera clear, mucus membranes moist Heart:S1S2 RRR without murmur, gallup, rub or click Lungs:clear without rales, rhonchi, or wheezes [id number], non tender, + BS, do not palpate liver spleen or masses Ext:no lower ext edema, Rt lower leg in hardware  Left lower leg wrapped., 2+ radial pulses Neuro:alert and oriented, MAE, follows commands, + facial symmetry Tele:  SR with PVCs    Lab Results:  Recent Labs  09/04/15 0140 09/05/15 0546  WBC 10.5 10.6*  HGB 9.1* 10.5*  HCT 29.7* 33.7*  PLT 480* 577*   BMET  Recent Labs  09/04/15 0140 09/05/15 0546  NA 137 137  K 4.0 4.3  CL 103 101  CO2 24 26  GLUCOSE 119* 115*  BUN 13 10  CREATININE 1.06 1.05  CALCIUM 8.9 9.1   No results for input(s):  TROPONINI in the last 72 hours.  Invalid input(s): CK, MB  Lab Results  Component Value Date   CHOL 93 08/31/2015   HDL 31* 08/31/2015   LDLCALC 45 08/31/2015   TRIG 87 08/31/2015   CHOLHDL 3.0 08/31/2015   Lab Results  Component Value Date   HGBA1C 6.4* 08/30/2015       Studies/Results: No results found.  Medications: I have reviewed medications Scheduled Meds: . sodium chloride   Intravenous Once  . aspirin  300 mg Rectal Daily   Or  . aspirin  325 mg Oral Daily  . atorvastatin  80 mg Oral Daily  . budesonide-formoterol  2 puff Inhalation BID  . Chlorhexidine Gluconate Cloth  6 each Topical Q0600  . docusate sodium  100 mg Oral BID  . fluticasone  1 spray Each Nare Daily  . insulin aspart  0-15 Units Subcutaneous TID WC  . isosorbide mononitrate  120 mg Oral Daily  . lactulose  15 g Oral Daily  . metoprolol  75 mg Oral BID  . montelukast  10 mg Oral QHS  . mupirocin ointment  1 application Nasal BID  . omega-3 acid ethyl esters  2 g Oral q morning - 10a  . pantoprazole  40 mg Oral Daily  . polyethylene glycol  17 g Oral Daily  . ranolazine  1,000 mg Oral BID  . sodium chloride  1 spray Each Nare QID  .  tiotropium  18 mcg Inhalation Daily  . zinc oxide   Topical Q Mon  . zolpidem  5 mg Oral QHS   Continuous Infusions: . sodium chloride 1 mL/kg/hr (09/05/15 0703)  . heparin 1,300 Units/hr (09/05/15 0744)   PRN Meds:.acetaminophen, albuterol, HYDROcodone-acetaminophen, morphine injection, nitroGLYCERIN, ondansetron (ZOFRAN) IV  Assessment/Plan: Principal Problem:   Stroke [facility]) Active Problems:   Essential hypertension, benign   Anemia   Type 2 diabetes mellitus (HCC)   Lupus anticoagulant positive   CKD (chronic kidney disease), stage II   COPD, moderate (HCC)   AKI (acute kidney injury) (HCC)   Troponin level elevated   Atherosclerosis of native coronary artery of native heart with other form of angina pectoris (HCC)   Chest pain   Thrombocytosis  (HCC)   Hyponatremia   Chronic diastolic CHF (congestive heart failure) (HCC)   Stroke (cerebrum) (HCC)   Atherosclerosis of native coronary artery of native heart   Acute ischemic stroke [facility])   ACS (acute coronary syndrome) ([place])   Cerebrovascular accident (CVA) due to bilateral stenosis of carotid arteries ([place])  1. Chest pain/NSTEMI - somewhat atypical symptoms, however troponin peak to 0.8. Chronic ST/changes on EKG He has known cad and now has a myoview that shows some ischemia. We originally had planned on doing a cath but there was some discussion from the neuro doctors covering over the weekend that he was at increased risk for CVA if we proceed with cath. We have asked neuro to comment on this / clarify this issue. We could possibly cath him later today or tomorrow    2. TIA - management per neuro, he does have evidence of cerebrovascular disease by imaging.   3. Anemia - management per primary team, if repeat troponin today pending up would recommend transfusion to Hgb of 9-10. Today 9.1  4. History of PE - he has history of clotting disorder on chronic xarelto, we have held this and started heparin in anticipation for cath. Will need to resume post cath.. - heparin not on MAR, patient received xarelto yesterday, per pharm to start today at 11AM.   5. Hypertension  - on several antihypertensives, with hypotension last night after medications administered.  Now 136/71 to 155/74    Leshia Kope, Wonda Cheng, MD  09/05/2015 8:33 AM    [facility] [place],  [place], [place]  [address] Pager [phone] Phone: [phone]; Fax: [phone]   Addendum:  Neuro has cleared him for cath although he is still at higher than normal risk for the procedure.   Given his presentation and + troponin levels, I think that we should proceed.     Teghan Philbin, Wonda Cheng, MD  09/05/2015 9:21 AM    [place],  [place], [place]  [address] Pager [phone] Phone: [phone]; Fax: [phone]

## 2015-09-05 NOTE — Progress Notes (Addendum)
Subjective: At this time no complaints. His left sided weakness has fully resolved.   Objective: Current vital signs: BP 157/82 mmHg  Pulse 84  Temp(Src) 99 F (37.2 C) (Oral)  Resp 18  Ht 5\' 2"  (1.575 m)  Wt 77.248 kg (170 lb 4.8 oz)  BMI 31.14 kg/m2  SpO2 99% Vital signs in last 24 hours: Temp:  [98 F (36.7 C)-99 F (37.2 C)] 99 F (37.2 C) (02/21 0537) Pulse Rate:  [74-90] 84 (02/21 0537) Resp:  [16-18] 18 (02/20 2339) BP: (129-157)/(63-82) 157/82 mmHg (02/21 0537) SpO2:  [98 %-100 %] 99 % (02/21 0537) Weight:  [77.248 kg (170 lb 4.8 oz)] 77.248 kg (170 lb 4.8 oz) (02/21 0537)  Intake/Output from previous day: 02/20 0701 - 02/21 0700 In: 1494.6 [P.O.:1200; I.V.:294.6] Out: 1426 [Urine:1425; Stool:1] Intake/Output this shift: Total I/O In: 229 [I.V.:229] Out: -  Nutritional status: Diet NPO time specified Except for: Sips with Meds  Neurologic Exam: General: NAD Mental Status: Alert, oriented, thought content appropriate.  Speech fluent without evidence of aphasia.  Able to follow 3 step commands without difficulty. Cranial Nerves: II:  Visual fields grossly normal, pupils equal, round, reactive to light and accommodation III,IV, VI: ptosis not present, extra-ocular motions intact bilaterally V,VII: smile symmetric, facial light touch sensation normal bilaterally VIII: hearing normal bilaterally IX,X: uvula rises symmetrically XI: bilateral shoulder shrug XII: midline tongue extension without atrophy or fasciculations  Motor: Right : Upper extremity   5/5    Left:     Upper extremity   5/5  Lower extremity   In brace    Lower extremity   5/5 Tone and bulk:normal tone throughout; no atrophy noted Sensory: Pinprick and light touch intact throughout, bilaterally in the UE not ale to test right LE and left LE has diabetic neuropathy (known)   Lab Results: Basic Metabolic Panel:  Recent Labs Lab 09/01/15 0713 09/02/15 0803 09/03/15 0423 09/04/15 0140  09/05/15 0546  NA 136 137 135 137 137  K 4.4 4.3 3.7 4.0 4.3  CL 102 102 103 103 101  CO2 27 26 24 24 26   GLUCOSE 130* 104* 123* 119* 115*  BUN 20 16 16 13 10   CREATININE 1.13 1.23 1.22 1.06 1.05  CALCIUM 8.7* 9.0 8.4* 8.9 9.1    Liver Function Tests:  Recent Labs Lab 08/30/15 2010  AST 25  ALT 15*  ALKPHOS 57  BILITOT 0.4  PROT 7.1  ALBUMIN 2.5*   No results for input(s): LIPASE, AMYLASE in the last 168 hours. No results for input(s): AMMONIA in the last 168 hours.  CBC:  Recent Labs Lab 08/30/15 2010  09/01/15 0713 09/02/15 0803 09/03/15 0423 09/04/15 0140 09/05/15 0546  WBC 17.6*  < > 10.8* 10.7* 10.8* 10.5 10.6*  NEUTROABS 10.9*  --   --   --   --   --   --   HGB 8.3*  < > 7.8* 8.4* 8.0* 9.1* 10.5*  HCT 27.1*  < > 26.5* 28.3* 27.0* 29.7* 33.7*  MCV 84.7  < > 85.8 84.0 83.9 83.9 85.1  PLT 585*  < > 566* 482* 489* 480* 577*  < > = values in this interval not displayed.  Cardiac Enzymes:  Recent Labs Lab 08/30/15 2010 08/31/15 0724 08/31/15 1250 09/01/15 0713  TROPONINI 0.18* 0.48* 0.83* 0.42*    Lipid Panel:  Recent Labs Lab 08/31/15 0319  CHOL 93  TRIG 87  HDL 31*  CHOLHDL 3.0  VLDL 17  LDLCALC 45  CBG:  Recent Labs Lab 09/03/15 2116 09/04/15 0725 09/04/15 1132 09/04/15 1629 09/05/15 0802  GLUCAP 165* 119* 115* 157* 111*    Microbiology: Results for orders placed or performed during the hospital encounter of 08/30/15  MRSA PCR Screening     Status: Abnormal   Collection Time: 09/01/15  6:59 PM  Result Value Ref Range Status   MRSA by PCR POSITIVE (A) NEGATIVE Final    Comment:        The GeneXpert MRSA Assay (FDA approved for NASAL specimens only), is one component of a comprehensive MRSA colonization surveillance program. It is not intended to diagnose MRSA infection nor to guide or monitor treatment for MRSA infections. RESULT CALLED TO, READ BACK BY AND VERIFIED WITHMorton Stall RN 2259 09/01/15 A BROWNING      Coagulation Studies: No results for input(s): LABPROT, INR in the last 72 hours.  Imaging: No results found.  Medications:  Scheduled: . sodium chloride   Intravenous Once  . aspirin  300 mg Rectal Daily   Or  . aspirin  325 mg Oral Daily  . atorvastatin  80 mg Oral Daily  . budesonide-formoterol  2 puff Inhalation BID  . Chlorhexidine Gluconate Cloth  6 each Topical Q0600  . docusate sodium  100 mg Oral BID  . fluticasone  1 spray Each Nare Daily  . insulin aspart  0-15 Units Subcutaneous TID WC  . isosorbide mononitrate  120 mg Oral Daily  . lactulose  15 g Oral Daily  . metoprolol  75 mg Oral BID  . montelukast  10 mg Oral QHS  . mupirocin ointment  1 application Nasal BID  . omega-3 acid ethyl esters  2 g Oral q morning - 10a  . pantoprazole  40 mg Oral Daily  . polyethylene glycol  17 g Oral Daily  . ranolazine  1,000 mg Oral BID  . sodium chloride  1 spray Each Nare QID  . tiotropium  18 mcg Inhalation Daily  . zinc oxide   Topical Q Mon  . zolpidem  5 mg Oral QHS    Assessment/Plan:  1. Risk stratification for cardiac catheterization in the setting of recent neurological symptoms of left sided weakness and blurred vision, suggestive of TIA. CT brain x 2, showed no definite evidence for acute infarcts. MRI couldn't be done due to external ankle fixator.   2. Given his risk factors with significant vascular stenosis on CTA, cannot exclude the possibility of small infarcts causing his symptoms, which have resolved now.  He has 70% left ICA stenosis and multiple areas of stenosis involving left vertebral artery noted on CTA neck. Consider adding Plavix 75 mg daily to asa 81 mg daily given the significant vascular stenoses, if no significant bleeding issues. Of note, dual antiplatelet therapy may increase risk for bleeding.  3. As clinically indicated, can proceed with planned cardiac catheterization if risk of morbidity/mortality from MI is felt to be higher than risk of  stroke during procedure. Overall, it is felt from a neurological standpoint that his cardiac risk likely outweighs the risk from stroke and risk/benefit profile is felt to militate in favor of performing cardiac catheterization. If performing catheterization, monitor BP closely, and prevent hypotension below SBP 120 to reduce risk for stroke, due to carotid and vertebral artery stenosis. Patient also at higher risk for stroke secondary to possible dislodgement of atherosclerotic plaque from aortic arch during catheterization procedure. Have discussed risks/benefits with husband and wife, who are also seeking input  from cardiology before making final decision on cardiac catheterization.  4. Continue atorvastatin.   Kerney Elbe, MD  Physical examination documented by Etta Quill PA-C, [facility], [phone] 09/05/2015, 9:23 AM

## 2015-09-05 NOTE — Progress Notes (Signed)
PT Cancellation Note  Patient Details Name: Henry [name] MRN: [id number] DOB: Dec 10, 1940   Cancelled Treatment:    Reason Eval/Treat Not Completed: Patient declined, no reason specified (Pt very emotional and anxious about upcoming cath, requests try back tomorrow)   DONAWERTH,KAREN 09/05/2015, 11:20 AM

## 2015-09-05 NOTE — Progress Notes (Signed)
ANTICOAGULATION CONSULT NOTE  - FOLLOW-UP  Pharmacy Consult for heparin Indication: chest pain/ACS  Allergies  Allergen Reactions  . Ace Inhibitors Cough    Cough  . Gabapentin Other (See Comments)    WAS BEHAVING VERY ODDLY    Patient Measurements: Height: 5\' 2"  (157.5 cm) Weight: 170 lb 4.8 oz (77.248 kg) IBW/kg (Calculated) : 54.6  Heparin dosing weight 78.3 kg   Vital Signs: Temp: 99 F (37.2 C) (02/21 0537) Temp Source: Oral (02/21 0537) BP: 157/82 mmHg (02/21 0537) Pulse Rate: 84 (02/21 0537)  Labs:  Recent Labs  09/03/15 0423 09/04/15 0140 09/04/15 1110 09/05/15 0546  HGB 8.0* 9.1*  --  10.5*  HCT 27.0* 29.7*  --  33.7*  PLT 489* 480*  --  577*  APTT 72* 106* 73* 64*  HEPARINUNFRC 1.40* 0.57  --  0.28*  CREATININE 1.22 1.06  --  1.05    Estimated Creatinine Clearance: 54.7 mL/min (by C-G formula based on Cr of 1.05).   Assessment: 75 yo male on heparin for r/o ACS (noted on xarelto PTA for hx PE with lupus AC). Continues on heparin with plans for cardiac cath Tuesday - not scheduled yet.  Note lower heparin level goals with recent TIA. PTT and heparin level now correlating, both slightly low. No issues with line or bleeding reported per RN.  Goal of Therapy:  Heparin level 0.3-0.5 INR 2-3 Monitor platelets by anticoagulation protocol: Yes   Plan:  Increase heparin to 1300 units/hr D/c PTT F/u post cardiac cath  Sherlon Handing, PharmD, BCPS Clinical pharmacist, pager [phone] 09/05/2015 7:24 AM

## 2015-09-05 NOTE — Interval H&P Note (Signed)
History and Physical Interval Note:  09/05/2015 2:52 PM  Henry [name]  has presented today for surgery, with the diagnosis of Unstable Angina. The various methods of treatment have been discussed with the patient and family. After consideration of risks, benefits and other options for treatment, the patient has consented to  Procedure(s): Left Heart Cath and Coronary Angiography (N/A) Graft Angiography with Possible Percutaneous Coronary Intervention as a surgical intervention .  The patient's history has been reviewed, patient examined, no change in status, stable for surgery.  I have reviewed the patient's chart and labs.  Questions were answered to the patient's satisfaction.     Cath Lab Visit (complete for each Cath Lab visit)  Clinical Evaluation Leading to the Procedure:   ACS: Yes.    Non-ACS:    Anginal Classification: CCS IV  Anti-ischemic medical therapy: Maximal Therapy (2 or more classes of medications)  Non-Invasive Test Results: High-risk stress test findings: cardiac mortality >3%/year  Prior CABG: Previous CABG  TIMI SCORE  Patient Information:  TIMI Score is 6   A/NSTEMI and high-risk features for short-term risk of death or nonfatal MI Revascularization of the presumed culprit artery  A (9)  Indication: 11; Score: 9 TIMI SCORE  Patient Information:  TIMI Score is 6   A/NSTEMI and high-risk features for short-term risk of death or nonfatal MI  Revascularization of multiple coronary arteries when the culprit artery cannot clearly be determined  A (9)  Indication: 12; Score: 9       Faust Thorington W

## 2015-09-06 ENCOUNTER — Encounter (HOSPITAL_COMMUNITY): Payer: Self-pay | Admitting: Cardiology

## 2015-09-06 DIAGNOSIS — I10 Essential (primary) hypertension: Secondary | ICD-10-CM

## 2015-09-06 DIAGNOSIS — I5032 Chronic diastolic (congestive) heart failure: Secondary | ICD-10-CM

## 2015-09-06 DIAGNOSIS — N179 Acute kidney failure, unspecified: Secondary | ICD-10-CM

## 2015-09-06 LAB — GLUCOSE, CAPILLARY
GLUCOSE-CAPILLARY: 127 mg/dL — AB (ref 65–99)
Glucose-Capillary: 98 mg/dL (ref 65–99)

## 2015-09-06 LAB — CBC
HEMATOCRIT: 29.8 % — AB (ref 39.0–52.0)
HEMOGLOBIN: 9.2 g/dL — AB (ref 13.0–17.0)
MCH: 26.4 pg (ref 26.0–34.0)
MCHC: 30.9 g/dL (ref 30.0–36.0)
MCV: 85.6 fL (ref 78.0–100.0)
Platelets: 469 10*3/uL — ABNORMAL HIGH (ref 150–400)
RBC: 3.48 MIL/uL — AB (ref 4.22–5.81)
RDW: 17.1 % — ABNORMAL HIGH (ref 11.5–15.5)
WBC: 10.4 10*3/uL (ref 4.0–10.5)

## 2015-09-06 LAB — BASIC METABOLIC PANEL
Anion gap: 13 (ref 5–15)
BUN: 11 mg/dL (ref 6–20)
CHLORIDE: 99 mmol/L — AB (ref 101–111)
CO2: 25 mmol/L (ref 22–32)
Calcium: 9.2 mg/dL (ref 8.9–10.3)
Creatinine, Ser: 1.21 mg/dL (ref 0.61–1.24)
GFR calc Af Amer: >60 mL/min (ref >60)
GFR calc non Af Amer: 57 mL/min — ABNORMAL LOW (ref >60)
Glucose, Bld: 105 mg/dL — ABNORMAL HIGH (ref 65–99)
POTASSIUM: 4.3 mmol/L (ref 3.5–5.1)
SODIUM: 137 mmol/L (ref 135–145)

## 2015-09-06 LAB — HEPARIN LEVEL (UNFRACTIONATED): Heparin Unfractionated: 0.13 IU/mL — ABNORMAL LOW (ref 0.30–0.70)

## 2015-09-06 LAB — APTT: APTT: 68 s — AB (ref 24–37)

## 2015-09-06 MED ORDER — RIVAROXABAN 20 MG PO TABS
20.0000 mg | ORAL_TABLET | Freq: Every day | ORAL | Status: DC
  Administered 2015-09-06: 20 mg via ORAL
  Filled 2015-09-06: qty 1

## 2015-09-06 MED ORDER — CLOPIDOGREL BISULFATE 75 MG PO TABS: 75.0000 mg | ORAL_TABLET | Freq: Every day | ORAL | Status: AC

## 2015-09-06 MED ORDER — HYDROCODONE-ACETAMINOPHEN 7.5-325 MG PO TABS: 1.0000 | ORAL_TABLET | Freq: Four times a day (QID) | ORAL | Status: AC | PRN

## 2015-09-06 MED ORDER — LOSARTAN POTASSIUM 100 MG PO TABS: 50.0000 mg | ORAL_TABLET | Freq: Every day | ORAL | Status: DC

## 2015-09-06 MED ORDER — ZOLPIDEM TARTRATE 10 MG PO TABS: 10.0000 mg | ORAL_TABLET | Freq: Every evening | ORAL | Status: AC | PRN

## 2015-09-06 MED ORDER — ZINC OXIDE 20 % EX OINT: TOPICAL_OINTMENT | CUTANEOUS | Status: AC

## 2015-09-06 NOTE — Progress Notes (Signed)
Report called to Nurse Nira Conn at [facility]. Carroll Kinds RN

## 2015-09-06 NOTE — Progress Notes (Signed)
   09/06/15 0940  PT Visit Information  Reason Eval/Treat Not Completed Other (comment) (Nursing deferral reports patient just recieved news of possible PCI heart cath and waiting to here back from MD on plans.  Reports patient is upset and anxious and to hold therapy at this time.  )

## 2015-09-06 NOTE — Progress Notes (Addendum)
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 Other hx includes COPD and Lupus. He was admitted via the ER with complaints of chest pain.  HGB was 9.4 then 8.6.  Also with anxiety.  chest pain at times occurs with emotional upset.   Subjective: Some chest pain/ tightness  during the night and SOB, now resolved. Cath showed severe calcified LCx stenosis.  Is a very complex lesion.  The RCA also fills  From left to right collaterals  Objective: Vital signs in last 24 hours: Temp:  [98.2 F (36.8 C)-99 F (37.2 C)] 98.2 F (36.8 C) (02/22 0500) Pulse Rate:  [0-89] 74 (02/22 0500) Resp:  [0-21] 16 (02/22 0500) BP: (115-141)/(64-87) 132/74 mmHg (02/22 0500) SpO2:  [0 %-99 %] 96 % (02/22 0500) Weight:  [174 lb 3.2 oz (79.017 kg)] 174 lb 3.2 oz (79.017 kg) (02/22 0500) Weight change: 3 lb 14.4 oz (1.769 kg) Last BM Date: 09/03/15 Intake/Output from previous day: not accurate 02/21 0701 - 02/22 0700 In: 714.2 [P.O.:120; I.V.:573.2] Out: 1100 [Urine:1100] Intake/Output this shift:    PE: General:Pleasant affect, NAD Skin:Warm and dry, brisk capillary refill HEENT:normocephalic, sclera clear, mucus membranes moist Heart:S1S2 RRR without murmur, gallup, rub or click Lungs:clear without rales, rhonchi, or wheezes [id number], non tender, + BS, do not palpate liver spleen or masses Ext:no lower ext edema, Rt lower leg in hardware  Left lower leg wrapped., 2+ radial pulses Neuro:alert and oriented, MAE, follows commands, + facial symmetry Tele:  SR with PVCs    Lab Results:  Recent Labs  09/05/15 0546 09/06/15 0539  WBC 10.6* 10.4  HGB 10.5* 9.2*  HCT 33.7* 29.8*  PLT 577* 469*   BMET  Recent Labs  09/05/15 0546 09/06/15 0539  NA 137 137  K 4.3 4.3  CL 101 99*  CO2 26 25  GLUCOSE 115* 105*    BUN 10 11  CREATININE 1.05 1.21  CALCIUM 9.1 9.2   No results for input(s): TROPONINI in the last 72 hours.  Invalid input(s): CK, MB  Lab Results  Component Value Date   CHOL 93 08/31/2015   HDL 31* 08/31/2015   LDLCALC 45 08/31/2015   TRIG 87 08/31/2015   CHOLHDL 3.0 08/31/2015   Lab Results  Component Value Date   HGBA1C 6.4* 08/30/2015       Studies/Results: No results found.  Medications: I have reviewed medications Scheduled Meds: . sodium chloride   Intravenous Once  . aspirin  300 mg Rectal Daily   Or  . aspirin  325 mg Oral Daily  . atorvastatin  80 mg Oral Daily  . budesonide-formoterol  2 puff Inhalation BID  . Chlorhexidine Gluconate Cloth  6 each Topical Q0600  . docusate sodium  100 mg Oral BID  . fluticasone  1 spray Each Nare Daily  . insulin aspart  0-15 Units Subcutaneous TID WC  . isosorbide mononitrate  120 mg Oral Daily  . lactulose  15 g Oral Daily  . metoprolol  75 mg Oral BID  . montelukast  10 mg Oral QHS  . mupirocin ointment  1 application Nasal BID  . omega-3 acid ethyl esters  2 g Oral q morning - 10a  . pantoprazole  40 mg Oral Daily  . polyethylene glycol  17 g Oral Daily  .  ranolazine  1,000 mg Oral BID  . sodium chloride  1 spray Each Nare QID  . sodium chloride flush  3 mL Intravenous Q12H  . tiotropium  18 mcg Inhalation Daily  . zinc oxide   Topical Q Mon  . zolpidem  5 mg Oral QHS   Continuous Infusions: . heparin 1,300 Units/hr (09/06/15 0403)   PRN Meds:.sodium chloride, acetaminophen, albuterol, HYDROcodone-acetaminophen, morphine injection, nitroGLYCERIN, ondansetron (ZOFRAN) IV, sodium chloride flush  Assessment/Plan: Principal Problem:   Stroke [facility]) Active Problems:   Essential hypertension, benign   Anemia   Type 2 diabetes mellitus (HCC)   Lupus anticoagulant positive   CKD (chronic kidney disease), stage II   COPD, moderate (HCC)   AKI (acute kidney injury) ([place])   Atherosclerosis of native coronary  artery with angina pectoris (HCC)   Troponin level elevated   Chest pain   Thrombocytosis (HCC)   Hyponatremia   Chronic diastolic CHF (congestive heart failure) (HCC)   Stroke (cerebrum) (HCC)   Acute ischemic stroke (HCC)   ACS (acute coronary syndrome) ([place])   Cerebrovascular accident (CVA) due to bilateral stenosis of carotid arteries ([place])  1. Chest pain/NSTEMI Has very complex LCx lesion.   the LCx  I think we should continue with medical therapy and attempt PCI only if he has CP that is not controlled with medications  I will discuss with Dr. Martinique later in the day  Anticipate DC to SNF tomorrow ( if we decide not to do the rotoblator)    2. TIA - management per neuro, he does have evidence of cerebrovascular disease by imaging.   3. Anemia - management per primary team, if repeat troponin today pending up would recommend transfusion to Hgb of 9-10. Today 9.1  4. History of PE - he has history of clotting disorder on chronic xarelto,   Restart xarelto once we know that he is not going to have any additional procedures.  - heparin not on MAR,    5. Hypertension  - on several antihypertensives, with hypotension last night after medications administered.  Now 136/71 to 155/74    Cheree Fowles, Wonda Cheng, MD  09/06/2015 9:10 AM    [place],  [place], [place]  [address] Pager [phone] Phone: [phone]; Fax: [phone]     Addendum:  I have discussed the case with Dr. Martinique.  He agrees that the lesion is an extremely high risk lesion and that PCI attempts would be very risky for the patient. Will restart Xarelto ( if ok from Int. Med standpoint / anemia )  Ok for him to go back to SNF.   Follow up with Dr. Domenic Polite in a month or so .    Merary Garguilo, Wonda Cheng, MD  09/06/2015 11:05 AM    [place],  [place], [place]  [address] Pager [phone] Phone: [phone]; Fax: [phone]

## 2015-09-06 NOTE — Clinical Social Work Note (Signed)
Per MD patient ready to DC back to [facility]. RN, patient/family, and facility notified of patient's DC. RN given number for report. DC packet on patient's chart. Ambulance transport requested for patient for next available transport. RN is assisting with getting Ambien prescription signed for patient. CSW signing off at this time.   Liz Beach MSW, [place], [place], [id number]

## 2015-09-06 NOTE — Discharge Summary (Signed)
Physician Discharge Summary  Henry [name] [id number] DOB: Dec 06, 1940 DOA: 08/30/2015  PCP: Sherrie Mustache, MD  Admit date: 08/30/2015 Discharge date: 09/06/2015  Time spent: 25* minutes  Recommendations for Outpatient Follow-up:  1. 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)   Troponin level elevated   Chest pain   Thrombocytosis (HCC)   Hyponatremia   Chronic diastolic CHF (congestive heart failure) (HCC)   Stroke (cerebrum) (HCC)   Acute ischemic stroke (HCC)   ACS (acute coronary syndrome) ([place])   Cerebrovascular accident (CVA) due to bilateral stenosis of carotid arteries [facility])   Discharge Condition: Stable  Diet recommendation: Heart healthy diet, carb modified diet  Filed Weights   09/04/15 0453 09/05/15 0537 09/06/15 0500  Weight: 76.295 kg (168 lb 3.2 oz) 77.248 kg (170 lb 4.8 oz) 79.017 kg (174 lb 3.2 oz)    History of present illness:  75 y.o. male with PMH of ASCVD status post CABG and subsequent stenting, COPD, hypercoagulable state on Xarelto, and chronic diastolic CHF who presents to the ED with acute onset of left-sided weakness and vision change. Patient was just discharged back to his SNF from this [facility] on 08/30/2015 following medical management of chest pain with elevated troponin.  Hospital Course:  1. TIA/CVA -Pt was recently discharged from on 08/30/2015 during which time he was evaluated for chest pain. Cardiology recommending continuing medical management.  - At a skilled nursing facility he was found by nursing staff to have left-sided facial droop. Patient reported bilateral blurry vision   -Pt returned to the emergency department where initial CT scan of brain without contrast was negative for acute intracranial abnormality.  -Neurologic symptoms resolved prior to arrival to ED   -He recently had a transthoracic echocardiogram which did not reveal intracardiac source of clot  -Carotid Dopplers showed >70% stenosis of right internal carotid artery -Repeat CT scan of the brain (since external fixator precludes ability to perform MRI of brain) did not show acute changes -Neurology has been consulted, input from 2/18 now available. Appreciate follow up by Neurology today. Patient has been started on Xarelto and Plavix per cardiology recommendation.  2. Chest pain/history of coronary artery disease/ACS. -Pt with history of multivessel coronary artery disease status post coronary artery bypass grafting 3 in 2005. His last cardiac catheterization was performed in 2012 that showed occluded venous grafts as well as severe circumflex disease. Cardiology at the time recommended medical management due to diffuse disease with very small caliber vessels. -Patient was recently admitted for chest pain and evaluated by cardiology who recommended ongoing medical management. His Ranexa was titrated up to 1000 mg by mouth twice a day -On 09/01/2015 he underwent stress test. Per Cardiology, patient was noted to have a new area of infarct likely corresponding to progression of LCX disease. Xarelto was changed to IV Heparin.  - Patient went for heart cath which showed a very complex left circumflex lesion. Cardiology recommended to just continue with medical therapy and no intervention due to high risk lesion. PCI attempts would have been very risky as per cardiology. Patient has been restarted on Xarelto and Plavix. I called  and discussed rest with cardiologist Dr. Cathie Olden who recommended to start both these medications.  3. History recurrent pulmonary embolism. -Continue on anticoagulation per  above  4. History of right Avulsion fracture  -He has an external fixator in place. Screws are magnetic thus not MRI candidate -No frank purulence noted at screw site -Consulted wound care, who recommended Orthopedic consult regarding dressing changes -Appreciate input by Dr. Ninfa Linden. Pt is cleared for surgery from Ortho standpoint with wound care recs noted  5. Dyslipidemia  -Continued Lipitor 80 mg by mouth daily   6. Hypertension  -Continue Imdur for 120 mg by mouth daily,metoprolol succinate 5 mg by mouth twice a day  -Verapamil d/c'd secondary to hypotension  7. Acute Kidney Injury -Labs showing creatinine of 1.65 on 08/31/2015 -Likely secondary to hypovolemia/prerenal azothemia.  -Lasix held as he was held as he was hydrated with NS -Cr had improved since admit - Will restart Lasix as patient has history of diastolic heart failure.  Hypertension Patient is on multiple antihypertensive medications, he became hypotensive in the hospital after starting these medications. Verapamil has been discontinued. Will cut down the dose of losartan to 50 mg by mouth daily Continue metoprolol 75 mg twice a day  8. Acute Anemia -Patient is s/p 1 unit of PRBC on 2/17 -S/p another unit of PRBC on 2/19 for hgb 8.0 -Hgb stable at 9.2 this AM -Goal to keep hgb 9-10.   Procedures:  Cardiac catheterization  Consultations:  Cardiology  Neurology  Discharge Exam: Filed Vitals:   09/05/15 2200 09/06/15 0500  BP: 119/70 132/74  Pulse: 80 74  Temp:  98.2 F (36.8 C)  Resp: 13 16    General: Appears in no acute distress Cardiovascular: S1-S2 is regular Respiratory: Clear to auscultation bilaterally  Discharge Instructions   Discharge Instructions    Diet - low sodium heart healthy    Complete by:  As directed      Increase activity slowly    Complete by:  As directed           Current Discharge Medication List    START taking these medications   Details   clopidogrel (PLAVIX) 75 MG tablet Take 1 tablet (75 mg total) by mouth daily. Qty: 30 tablet, Refills: 2    zinc oxide 20 % ointment Apply topically every Monday. Qty: 56.7 g, Refills: 0      CONTINUE these medications which have CHANGED   Details  HYDROcodone-acetaminophen (NORCO) 7.5-325 MG tablet Take 1 tablet by mouth every 6 (six) hours as needed for moderate pain. Qty: 15 tablet, Refills: 0    losartan (COZAAR) 100 MG tablet Take 0.5 tablets (50 mg total) by mouth daily. Qty: 30 tablet, Refills: 6      CONTINUE these medications which have NOT CHANGED   Details  acetaminophen (TYLENOL) 325 MG tablet Take 650 mg by mouth every 4 (four) hours as needed for mild pain. For pain    albuterol (PROAIR HFA) 108 (90 BASE) MCG/ACT inhaler Inhale 2 puffs into the lungs every 6 (six) hours as needed for wheezing or shortness of breath.     atorvastatin (LIPITOR) 80 MG tablet Take 1 tablet (80 mg total) by mouth daily. Qty: 30 tablet, Refills: 0    budesonide-formoterol (SYMBICORT) 160-4.5 MCG/ACT inhaler Inhale 2 puffs into the lungs 2 (two) times daily.    Coenzyme Q10 (CO Q 10) 100 MG CAPS Take 100 mg by mouth daily.    docusate sodium (COLACE) 100 MG capsule  Take 100 mg by mouth 2 (two) times daily.    fish oil-omega-3 fatty acids 1000 MG capsule Take 2 g by mouth every morning.     fluticasone (FLONASE) 50 MCG/ACT nasal spray Place 1 spray into both nostrils daily.     furosemide (LASIX) 20 MG tablet Take 20 mg by mouth daily.    isosorbide mononitrate (IMDUR) 120 MG 24 hr tablet Take 120 mg by mouth daily.    lactulose (CHRONULAC) 10 GM/15ML solution Take 15 g by mouth daily.     metoprolol (LOPRESSOR) 50 MG tablet TAKE 1 AND 1/2 TABLETS BY MOUTH TWICE DAILY Qty: 90 tablet, Refills: 2    montelukast (SINGULAIR) 10 MG tablet Take 10 mg by mouth at bedtime.    nitroGLYCERIN (NITROSTAT) 0.4 MG SL tablet Place 1 tablet (0.4 mg total) under the tongue every 5 (five) minutes  x 3 doses as needed. For chest pain Qty: 25 tablet, Refills: 3    omeprazole (PRILOSEC) 40 MG capsule Take 40 mg by mouth every evening.    polyethylene glycol (MIRALAX / GLYCOLAX) packet Take 17 g by mouth daily.    potassium chloride (K-DUR) 10 MEQ tablet Take 10 mEq by mouth every morning.    rivaroxaban (XARELTO) 20 MG TABS tablet Take 20 mg by mouth daily.    sitaGLIPtin (JANUVIA) 25 MG tablet Take 1 tablet (25 mg total) by mouth daily. Qty: 30 tablet, Refills: 0    sodium chloride (OCEAN) 0.65 % SOLN nasal spray Place 1 spray into both nostrils 4 (four) times daily.    tiotropium (SPIRIVA) 18 MCG inhalation capsule Place 1 capsule (18 mcg total) into inhaler and inhale daily. Qty: 30 capsule, Refills: 5    zolpidem (AMBIEN) 10 MG tablet Take 10 mg by mouth at bedtime. Refills: 2    ranolazine (RANEXA) 1000 MG SR tablet Take 1 tablet (1,000 mg total) by mouth 2 (two) times daily. Qty: 60 tablet, Refills: 1    sertraline (ZOLOFT) 25 MG tablet Take 1 tablet (25 mg total) by mouth daily. Qty: 30 tablet, Refills: 1      STOP taking these medications     verapamil (CALAN-SR) 240 MG CR tablet      chlorthalidone (HYGROTON) 25 MG tablet        Allergies  Allergen Reactions  . Ace Inhibitors Cough    Cough  . Gabapentin Other (See Comments)    WAS BEHAVING VERY ODDLY      The results of significant diagnostics from this hospitalization (including imaging, microbiology, ancillary and laboratory) are listed below for reference.    Significant Diagnostic Studies: Ct Angio Head W/cm &/or Wo Cm  08/30/2015  CLINICAL DATA:  LEFT-sided weakness today.  Code stroke. EXAM: CT ANGIOGRAPHY HEAD AND NECK TECHNIQUE: Multidetector CT imaging of the head and neck was performed using the standard protocol during bolus administration of intravenous contrast. Multiplanar CT image reconstructions and MIPs were obtained to evaluate the vascular anatomy. Carotid stenosis measurements (when  applicable) are obtained utilizing NASCET criteria, using the distal internal carotid diameter as the denominator. CONTRAST:  133mL OMNIPAQUE IOHEXOL 350 MG/ML SOLN COMPARISON:  CT head August 30, 2015 at 2042 hours. FINDINGS: CTA NECK Aortic arch: Normal appearance of the thoracic arch, normal branch pattern. Mild calcific atherosclerosis. The origins of the innominate, left Common carotid artery and subclavian artery are widely patent. Right carotid system: Common carotid artery is patent, coursing in a straight line fashion. Eccentric intimal thickening and calcific atherosclerosis results in  very mild stenosis mid RIGHT Common carotid artery. Eccentric calcific atherosclerosis results in 65-70% stenosis RIGHT internal carotid artery, 17 mm segment. Mild poststenotic dilatation. Normal appearance of the included internal carotid artery. Left carotid system: Common carotid artery is widely patent, coursing in a straight line fashion. Eccentric calcific atherosclerosis results in less than 50% stenosis. Normal appearance of the mid to distal cervical internal carotid artery. Vertebral arteries:Calcific atherosclerosis results in severe stenosis bilateral vertebral artery origins, with mild poststenotic dilatation distal LEFT V4 segment. LEFT vertebral artery is dominant. Extrinsic calcific atherosclerosis results in moderate tandem stenosis LEFT V4 segment. Skeleton: No acute osseous process though bone windows have not been submitted. Poor dentition with multiple dental caries, and absent teeth. C5-6 intradiscal calcifications, and findings of auto interbody arthrodesis. Severe C6-7 degenerative disc. Severe LEFT upper cervical facet arthropathy. Status post median sternotomy. Other neck: Soft tissues of the neck are nonacute though, not tailored for evaluation. CTA HEAD Anterior circulation: Patent cervical internal carotid arteries, petrous, cavernous and supra clinoid internal carotid arteries. Calcific  atherosclerosis of the carotid siphons results in at least moderate stenosis of bilateral supraclinoid internal carotid arteries. Widely patent anterior communicating artery. Patent anterior and middle cerebral arteries. Mild stenosis proximal RIGHT A1 segment. Posterior circulation: RIGHT vertebral artery terminates in the posterior inferior cerebellar artery. Heavy calcification of distal LEFT V4 segment resulting at least moderate long segment of stenosis to proximal basilar artery which is patent. Bilateral small posterior communicating arteries are present. High-grade stenosis RIGHT P1 segment. Bilateral posterior cerebral arteries are patent. Mild tandem stenosis LEFT posterior cerebral artery compatible with atherosclerosis. No large vessel occlusion, dissection, contrast extravasation or aneurysm within the anterior nor posterior circulation. No abnormal intracranial enhancement. IMPRESSION: CTA NECK: Atherosclerosis resulting in 65-70% stenosis RIGHT internal carotid artery origin. Less than 50% stenosis LEFT internal carotid artery origin. High-grade stenosis bilateral vertebral artery origins, which are patent. CTA HEAD:  No acute large vessel occlusion. Moderate stenosis of bilateral supraclinoid internal carotid artery due to calcific atherosclerosis. Moderate stenosis LEFT V4 segment. High-grade stenosis RIGHT P1 segment, complete circle of Willis. Electronically Signed   By: Elon Alas M.D.   On: 08/30/2015 22:15   Dg Ankle 2 Views Right  09/02/2015  CLINICAL DATA:  Ankle fracture. EXAM: RIGHT ANKLE - 2 VIEW COMPARISON:  02/05/2015 right tibia/fibula radiographs. FINDINGS: External fixation hardware overlies the right midfoot. No fracture, dislocation or suspicious focal osseous lesion is seen in the right ankle. There is diffuse osteopenia. Vascular calcifications are seen throughout the soft tissues. Small Achilles right calcaneal spur. Staples overlie the posterior distal right lower  extremity at the level of the distal right tibial shaft. IMPRESSION: External fixation hardware overlies the right midfoot. No fracture or dislocation is seen in the right ankle. Diffuse osteopenia. Electronically Signed   By: Ilona Sorrel M.D.   On: 09/02/2015 18:12   Ct Head Wo Contrast  09/01/2015  CLINICAL DATA:  Left facial droop.  Blurred vision 1 week ago EXAM: CT HEAD WITHOUT CONTRAST TECHNIQUE: Contiguous axial images were obtained from the base of the skull through the vertex without intravenous contrast. COMPARISON:  08/30/2015 FINDINGS: There is atrophy and chronic small vessel disease changes. No acute intracranial abnormality. Specifically, no hemorrhage, hydrocephalus, mass lesion, acute infarction, or significant intracranial injury. No acute calvarial abnormality. Visualized paranasal sinuses and mastoids clear. Orbital soft tissues unremarkable. IMPRESSION: No acute intracranial abnormality. Atrophy, chronic microvascular disease. Electronically Signed   By: Rolm Baptise M.D.   On:  09/01/2015 10:30   Ct Head Wo Contrast  08/30/2015  CLINICAL DATA:  Code stroke.  Left facial droop. EXAM: CT HEAD WITHOUT CONTRAST TECHNIQUE: Contiguous axial images were obtained from the base of the skull through the vertex without intravenous contrast. COMPARISON:  04/03/2015. FINDINGS: There is no evidence for acute hemorrhage, hydrocephalus, mass lesion, or abnormal extra-axial fluid collection. No definite CT evidence for acute infarction. Diffuse loss of parenchymal volume is consistent with atrophy. Patchy low attenuation in the deep hemispheric and periventricular white matter is nonspecific, but likely reflects chronic microvascular ischemic demyelination. The visualized paranasal sinuses and mastoid air cells are clear. IMPRESSION: Stable.  No acute intracranial abnormality. Atrophy with chronic small vessel white matter ischemic disease. Critical Value/emergent results were called by me at the time of  interpretation on 08/30/2015 at 8:23 pm to Dr. Milton Ferguson , who verbally acknowledged these results. Electronically Signed   By: Misty Stanley M.D.   On: 08/30/2015 20:23   Ct Angio Neck W/cm &/or Wo/cm  08/30/2015  CLINICAL DATA:  LEFT-sided weakness today.  Code stroke. EXAM: CT ANGIOGRAPHY HEAD AND NECK TECHNIQUE: Multidetector CT imaging of the head and neck was performed using the standard protocol during bolus administration of intravenous contrast. Multiplanar CT image reconstructions and MIPs were obtained to evaluate the vascular anatomy. Carotid stenosis measurements (when applicable) are obtained utilizing NASCET criteria, using the distal internal carotid diameter as the denominator. CONTRAST:  175mL OMNIPAQUE IOHEXOL 350 MG/ML SOLN COMPARISON:  CT head August 30, 2015 at 2042 hours. FINDINGS: CTA NECK Aortic arch: Normal appearance of the thoracic arch, normal branch pattern. Mild calcific atherosclerosis. The origins of the innominate, left Common carotid artery and subclavian artery are widely patent. Right carotid system: Common carotid artery is patent, coursing in a straight line fashion. Eccentric intimal thickening and calcific atherosclerosis results in very mild stenosis mid RIGHT Common carotid artery. Eccentric calcific atherosclerosis results in 65-70% stenosis RIGHT internal carotid artery, 17 mm segment. Mild poststenotic dilatation. Normal appearance of the included internal carotid artery. Left carotid system: Common carotid artery is widely patent, coursing in a straight line fashion. Eccentric calcific atherosclerosis results in less than 50% stenosis. Normal appearance of the mid to distal cervical internal carotid artery. Vertebral arteries:Calcific atherosclerosis results in severe stenosis bilateral vertebral artery origins, with mild poststenotic dilatation distal LEFT V4 segment. LEFT vertebral artery is dominant. Extrinsic calcific atherosclerosis results in moderate  tandem stenosis LEFT V4 segment. Skeleton: No acute osseous process though bone windows have not been submitted. Poor dentition with multiple dental caries, and absent teeth. C5-6 intradiscal calcifications, and findings of auto interbody arthrodesis. Severe C6-7 degenerative disc. Severe LEFT upper cervical facet arthropathy. Status post median sternotomy. Other neck: Soft tissues of the neck are nonacute though, not tailored for evaluation. CTA HEAD Anterior circulation: Patent cervical internal carotid arteries, petrous, cavernous and supra clinoid internal carotid arteries. Calcific atherosclerosis of the carotid siphons results in at least moderate stenosis of bilateral supraclinoid internal carotid arteries. Widely patent anterior communicating artery. Patent anterior and middle cerebral arteries. Mild stenosis proximal RIGHT A1 segment. Posterior circulation: RIGHT vertebral artery terminates in the posterior inferior cerebellar artery. Heavy calcification of distal LEFT V4 segment resulting at least moderate long segment of stenosis to proximal basilar artery which is patent. Bilateral small posterior communicating arteries are present. High-grade stenosis RIGHT P1 segment. Bilateral posterior cerebral arteries are patent. Mild tandem stenosis LEFT posterior cerebral artery compatible with atherosclerosis. No large vessel occlusion, dissection, contrast extravasation  or aneurysm within the anterior nor posterior circulation. No abnormal intracranial enhancement. IMPRESSION: CTA NECK: Atherosclerosis resulting in 65-70% stenosis RIGHT internal carotid artery origin. Less than 50% stenosis LEFT internal carotid artery origin. High-grade stenosis bilateral vertebral artery origins, which are patent. CTA HEAD:  No acute large vessel occlusion. Moderate stenosis of bilateral supraclinoid internal carotid artery due to calcific atherosclerosis. Moderate stenosis LEFT V4 segment. High-grade stenosis RIGHT P1  segment, complete circle of Willis. Electronically Signed   By: Elon Alas M.D.   On: 08/30/2015 22:15   US Carotid Bilateral  08/31/2015  CLINICAL DATA:  Stroke.  Carotid artery disease on recent CTA. EXAM: BILATERAL CAROTID DUPLEX ULTRASOUND TECHNIQUE: Pearline Cables scale imaging, color Doppler and duplex ultrasound were performed of bilateral carotid and vertebral arteries in the neck. COMPARISON:  07/17/2010 and CTA 08/30/2015 FINDINGS: Criteria: Quantification of carotid stenosis is based on velocity parameters that correlate the residual internal carotid diameter with NASCET-based stenosis levels, using the diameter of the distal internal carotid lumen as the denominator for stenosis measurement. The following velocity measurements were obtained: RIGHT ICA:  465 cm/sec CCA:  94 cm/sec SYSTOLIC ICA/CCA RATIO:  4.9 DIASTOLIC ICA/CCA RATIO:  22 ECA:  90 cm/sec LEFT ICA:  129 cm/sec CCA:  [id number] cm/sec SYSTOLIC ICA/CCA RATIO:  1.4 DIASTOLIC ICA/CCA RATIO:  1.6 ECA:  99 cm/sec RIGHT CAROTID ARTERY: Small amount of circumferential echogenic plaque in the distal common carotid artery. Circumferential plaque at the right carotid bulb. Echogenic plaque and critical stenosis in the proximal and mid right internal carotid artery. There is a markedly elevated peak systolic velocity measuring up to 465 cm/sec in the mid internal carotid artery with turbulent flow. Peak systolic velocity in the distal internal carotid artery is also elevated measuring up to 213 cm/sec. RIGHT VERTEBRAL ARTERY: Antegrade flow and normal waveform in the right vertebral artery. LEFT CAROTID ARTERY: Small amount of echogenic plaque throughout the left common carotid artery. Small amount of plaque in the left carotid bulb. Echogenic plaque in the proximal internal carotid artery. Peak systolic velocity in the internal carotid artery is mildly elevated, measuring up to 129 cm/sec. LEFT VERTEBRAL ARTERY:  Not confidently visualized. IMPRESSION:  Atherosclerotic plaque in the right internal carotid artery with a critical stenosis. Estimated degree of stenosis is greater than 70% and suspect a near occlusion. Atherosclerotic plaque involving the left internal carotid artery. Estimated degree of stenosis in left internal carotid artery is less than 50%. Patent right vertebral artery. Left vertebral artery is not visualized. This vessel was better characterized on the recent CTA examination. These results will be called to the ordering clinician or representative by the Radiologist Assistant, and communication documented in the PACS or zVision Dashboard. Electronically Signed   By: Markus Daft M.D.   On: 08/31/2015 16:22   Nm Myocar Multi W/spect W/wall Motion / Ef  09/01/2015   ST segment depression was noted during stress. There is ST depression in inferior and lateral precordial leads at baseline that is unchanged after injection  Findings consistent with prior inferior infarction with mild peri-infarct ischemia. There is evidence of inferolateral and lateral infarct with mild to moderate peri-infact ischemia.  The left ventricular ejection fraction is severely decreased (<30%).  This is a high risk study. High risk due to low ejection fraction and multiple perfusion defects. Of note recent echo shows LVEF 60-65%, suggesting probable nuclear technical issue with volume assessment.    Dg Chest Port 1 View  08/31/2015  CLINICAL DATA:  Chest  pain and shortness of Breath EXAM: PORTABLE CHEST 1 VIEW COMPARISON:  08/27/2015 FINDINGS: Cardiac shadow is stable. Postsurgical changes are again noted. Lungs are clear bilaterally. No acute bony abnormality is seen. IMPRESSION: No active disease. Electronically Signed   By: Inez Catalina M.D.   On: 08/31/2015 12:55   Dg Chest Portable 1 View  08/27/2015  CLINICAL DATA:  Chest pain EXAM: PORTABLE CHEST 1 VIEW COMPARISON:  04/03/2015 FINDINGS: Cardiac shadow is within normal limits. Postsurgical changes are  noted. Aortic calcifications are again seen. Lungs are well-aerated without focal infiltrate or sizable effusion. No acute bony abnormality is seen. IMPRESSION: No active disease. Electronically Signed   By: Inez Catalina M.D.   On: 08/27/2015 17:03    Microbiology: Recent Results (from the past 240 hour(s))  MRSA PCR Screening     Status: Abnormal   Collection Time: 09/01/15  6:59 PM  Result Value Ref Range Status   MRSA by PCR POSITIVE (A) NEGATIVE Final    Comment:        The GeneXpert MRSA Assay (FDA approved for NASAL specimens only), is one component of a comprehensive MRSA colonization surveillance program. It is not intended to diagnose MRSA infection nor to guide or monitor treatment for MRSA infections. RESULT CALLED TO, READ BACK BY AND VERIFIED WITHMorton Stall RN [id number] 09/01/15 A BROWNING      Labs: Basic Metabolic Panel:  Recent Labs Lab 09/02/15 0803 09/03/15 0423 09/04/15 0140 09/05/15 0546 09/06/15 0539  NA 137 135 137 137 137  K 4.3 3.7 4.0 4.3 4.3  CL 102 103 103 101 99*  CO2 26 24 24 26 25   GLUCOSE 104* 123* 119* 115* 105*  BUN 16 16 13 10 11   CREATININE 1.23 1.22 1.06 1.05 1.21  CALCIUM 9.0 8.4* 8.9 9.1 9.2   Liver Function Tests:  Recent Labs Lab 08/30/15 2010  AST 25  ALT 15*  ALKPHOS 57  BILITOT 0.4  PROT 7.1  ALBUMIN 2.5*   No results for input(s): LIPASE, AMYLASE in the last 168 hours. No results for input(s): AMMONIA in the last 168 hours. CBC:  Recent Labs Lab 08/30/15 2010  09/02/15 0803 09/03/15 0423 09/04/15 0140 09/05/15 0546 09/06/15 0539  WBC 17.6*  < > 10.7* 10.8* 10.5 10.6* 10.4  NEUTROABS 10.9*  --   --   --   --   --   --   HGB 8.3*  < > 8.4* 8.0* 9.1* 10.5* 9.2*  HCT 27.1*  < > 28.3* 27.0* 29.7* 33.7* 29.8*  MCV 84.7  < > 84.0 83.9 83.9 85.1 85.6  PLT 585*  < > 482* 489* 480* 577* 469*  < > = values in this interval not displayed. Cardiac Enzymes:  Recent Labs Lab 08/30/15 2010 08/31/15 0724  08/31/15 1250 09/01/15 0713  TROPONINI 0.18* 0.48* 0.83* 0.42*   BNP: BNP (last 3 results)  Recent Labs  02/05/15 1735  BNP 120.1*    ProBNP (last 3 results) No results for input(s): PROBNP in the last 8760 hours.  CBG:  Recent Labs Lab 09/05/15 1200 09/05/15 1712 09/05/15 2128 09/06/15 0748 09/06/15 1123  GLUCAP 109* 112* 139* 98 127*       Signed:  Eleonore Chiquito S MD.  [facility] 09/06/2015, 12:39 PM

## 2015-09-06 NOTE — Plan of Care (Signed)
Problem: Consults Goal: Cardiac Cath Patient Education (See Patient Education module for education specifics.)  Outcome: Completed/Met Date Met:  09/06/15 Completed education for cardiac catheterization day after TR band removed and dressing applied. Patient has been following limb restrictions and elevating above heart level. Left radial catheterization site is level 0; no evidence of complications noted.  Other than chronic pain of right lower extremity, patient has had no complaints overnight; responding well to oral medication. Vital signs earlier this evening post-cath were WDL: Filed Vitals:    09/05/15 2000 09/05/15 2100 09/05/15 2115 09/05/15 2200  BP: 140/73 115/65   119/70  Pulse: 89 82   80  Temp: 98.6 F (37 C)        TempSrc: Oral        Resp: _0 Height:          Weight:          SpO2: 97% 96% 97% 97%    Stroke care pathway/education has been completed/met with exception to discharge instructions and rehabilitation goals. No change in neuro status, transient or otherwise, since 70% right ICA stenosis and <50% left ICA stenosis was identified by carotid doppler study.  Appropriate to migrate to modified MI pathway at this time; will initiate. Have resumed IV heparin at 1,300 units/hr per orders. Awaiting possible PCI on Friday.  Continuing to monitor.

## 2015-09-07 ENCOUNTER — Inpatient Hospital Stay (HOSPITAL_COMMUNITY)
Admission: EM | Admit: 2015-09-07 | Discharge: 2015-09-10 | DRG: 302 | Disposition: A | Discharge status: Skilled Nursing Facility | Payer: Medicare Other | Attending: Cardiovascular Disease | Admitting: Cardiovascular Disease

## 2015-09-07 ENCOUNTER — Emergency Department (HOSPITAL_COMMUNITY): Payer: Medicare Other

## 2015-09-07 ENCOUNTER — Encounter (HOSPITAL_COMMUNITY): Payer: Self-pay | Admitting: Emergency Medicine

## 2015-09-07 DIAGNOSIS — E785 Hyperlipidemia, unspecified: Secondary | ICD-10-CM | POA: Diagnosis present

## 2015-09-07 DIAGNOSIS — I200 Unstable angina: Secondary | ICD-10-CM

## 2015-09-07 DIAGNOSIS — J449 Chronic obstructive pulmonary disease, unspecified: Secondary | ICD-10-CM | POA: Diagnosis present

## 2015-09-07 DIAGNOSIS — N182 Chronic kidney disease, stage 2 (mild): Secondary | ICD-10-CM | POA: Diagnosis present

## 2015-09-07 DIAGNOSIS — I2584 Coronary atherosclerosis due to calcified coronary lesion: Secondary | ICD-10-CM | POA: Diagnosis present

## 2015-09-07 DIAGNOSIS — Z8249 Family history of ischemic heart disease and other diseases of the circulatory system: Secondary | ICD-10-CM | POA: Diagnosis not present

## 2015-09-07 DIAGNOSIS — I2582 Chronic total occlusion of coronary artery: Secondary | ICD-10-CM | POA: Diagnosis present

## 2015-09-07 DIAGNOSIS — T40605A Adverse effect of unspecified narcotics, initial encounter: Secondary | ICD-10-CM | POA: Diagnosis present

## 2015-09-07 DIAGNOSIS — I252 Old myocardial infarction: Secondary | ICD-10-CM | POA: Diagnosis not present

## 2015-09-07 DIAGNOSIS — K219 Gastro-esophageal reflux disease without esophagitis: Secondary | ICD-10-CM | POA: Diagnosis present

## 2015-09-07 DIAGNOSIS — Z8673 Personal history of transient ischemic attack (TIA), and cerebral infarction without residual deficits: Secondary | ICD-10-CM | POA: Diagnosis not present

## 2015-09-07 DIAGNOSIS — N183 Chronic kidney disease, stage 3 unspecified: Secondary | ICD-10-CM | POA: Diagnosis present

## 2015-09-07 DIAGNOSIS — I209 Angina pectoris, unspecified: Secondary | ICD-10-CM | POA: Diagnosis not present

## 2015-09-07 DIAGNOSIS — I2089 Other forms of angina pectoris: Secondary | ICD-10-CM | POA: Diagnosis present

## 2015-09-07 DIAGNOSIS — Z86711 Personal history of pulmonary embolism: Secondary | ICD-10-CM | POA: Diagnosis not present

## 2015-09-07 DIAGNOSIS — G4733 Obstructive sleep apnea (adult) (pediatric): Secondary | ICD-10-CM | POA: Diagnosis present

## 2015-09-07 DIAGNOSIS — X58XXXD Exposure to other specified factors, subsequent encounter: Secondary | ICD-10-CM | POA: Diagnosis present

## 2015-09-07 DIAGNOSIS — I25111 Atherosclerotic heart disease of native coronary artery with angina pectoris with documented spasm: Secondary | ICD-10-CM

## 2015-09-07 DIAGNOSIS — Z888 Allergy status to other drugs, medicaments and biological substances status: Secondary | ICD-10-CM

## 2015-09-07 DIAGNOSIS — Z7902 Long term (current) use of antithrombotics/antiplatelets: Secondary | ICD-10-CM | POA: Diagnosis not present

## 2015-09-07 DIAGNOSIS — I25710 Atherosclerosis of autologous vein coronary artery bypass graft(s) with unstable angina pectoris: Secondary | ICD-10-CM | POA: Diagnosis present

## 2015-09-07 DIAGNOSIS — T82855A Stenosis of coronary artery stent, initial encounter: Secondary | ICD-10-CM | POA: Diagnosis present

## 2015-09-07 DIAGNOSIS — I208 Other forms of angina pectoris: Secondary | ICD-10-CM | POA: Diagnosis not present

## 2015-09-07 DIAGNOSIS — S86011D Strain of right Achilles tendon, subsequent encounter: Secondary | ICD-10-CM

## 2015-09-07 DIAGNOSIS — D6862 Lupus anticoagulant syndrome: Secondary | ICD-10-CM | POA: Diagnosis present

## 2015-09-07 DIAGNOSIS — F419 Anxiety disorder, unspecified: Secondary | ICD-10-CM | POA: Diagnosis present

## 2015-09-07 DIAGNOSIS — K5903 Drug induced constipation: Secondary | ICD-10-CM | POA: Diagnosis present

## 2015-09-07 DIAGNOSIS — R079 Chest pain, unspecified: Secondary | ICD-10-CM | POA: Diagnosis present

## 2015-09-07 DIAGNOSIS — Z87891 Personal history of nicotine dependence: Secondary | ICD-10-CM | POA: Diagnosis not present

## 2015-09-07 DIAGNOSIS — Z7189 Other specified counseling: Secondary | ICD-10-CM | POA: Insufficient documentation

## 2015-09-07 DIAGNOSIS — I25110 Atherosclerotic heart disease of native coronary artery with unstable angina pectoris: Secondary | ICD-10-CM | POA: Diagnosis present

## 2015-09-07 DIAGNOSIS — I5032 Chronic diastolic (congestive) heart failure: Secondary | ICD-10-CM | POA: Diagnosis present

## 2015-09-07 DIAGNOSIS — E1122 Type 2 diabetes mellitus with diabetic chronic kidney disease: Secondary | ICD-10-CM | POA: Diagnosis present

## 2015-09-07 DIAGNOSIS — I2510 Atherosclerotic heart disease of native coronary artery without angina pectoris: Secondary | ICD-10-CM | POA: Diagnosis not present

## 2015-09-07 DIAGNOSIS — I130 Hypertensive heart and chronic kidney disease with heart failure and stage 1 through stage 4 chronic kidney disease, or unspecified chronic kidney disease: Secondary | ICD-10-CM | POA: Diagnosis present

## 2015-09-07 DIAGNOSIS — E43 Unspecified severe protein-calorie malnutrition: Secondary | ICD-10-CM

## 2015-09-07 DIAGNOSIS — Z515 Encounter for palliative care: Secondary | ICD-10-CM | POA: Insufficient documentation

## 2015-09-07 DIAGNOSIS — I2583 Coronary atherosclerosis due to lipid rich plaque: Secondary | ICD-10-CM | POA: Diagnosis present

## 2015-09-07 DIAGNOSIS — I10 Essential (primary) hypertension: Secondary | ICD-10-CM

## 2015-09-07 DIAGNOSIS — Z951 Presence of aortocoronary bypass graft: Secondary | ICD-10-CM

## 2015-09-07 DIAGNOSIS — Z7901 Long term (current) use of anticoagulants: Secondary | ICD-10-CM | POA: Diagnosis not present

## 2015-09-07 DIAGNOSIS — R911 Solitary pulmonary nodule: Secondary | ICD-10-CM | POA: Diagnosis present

## 2015-09-07 DIAGNOSIS — Z803 Family history of malignant neoplasm of breast: Secondary | ICD-10-CM

## 2015-09-07 LAB — CBC
HEMATOCRIT: 32.9 % — AB (ref 39.0–52.0)
Hemoglobin: 10.4 g/dL — ABNORMAL LOW (ref 13.0–17.0)
MCH: 26.6 pg (ref 26.0–34.0)
MCHC: 31.6 g/dL (ref 30.0–36.0)
MCV: 84.1 fL (ref 78.0–100.0)
Platelets: 543 10*3/uL — ABNORMAL HIGH (ref 150–400)
RBC: 3.91 MIL/uL — ABNORMAL LOW (ref 4.22–5.81)
RDW: 17.2 % — AB (ref 11.5–15.5)
WBC: 11.6 10*3/uL — ABNORMAL HIGH (ref 4.0–10.5)

## 2015-09-07 LAB — BRAIN NATRIURETIC PEPTIDE: B Natriuretic Peptide: 674.2 pg/mL — ABNORMAL HIGH (ref 0.0–100.0)

## 2015-09-07 LAB — BASIC METABOLIC PANEL
Anion gap: 11 (ref 5–15)
BUN: 11 mg/dL (ref 6–20)
CHLORIDE: 101 mmol/L (ref 101–111)
CO2: 24 mmol/L (ref 22–32)
Calcium: 9 mg/dL (ref 8.9–10.3)
Creatinine, Ser: 1.02 mg/dL (ref 0.61–1.24)
GFR calc Af Amer: >60 mL/min (ref >60)
GFR calc non Af Amer: >60 mL/min (ref >60)
GLUCOSE: 142 mg/dL — AB (ref 65–99)
POTASSIUM: 4.2 mmol/L (ref 3.5–5.1)
Sodium: 136 mmol/L (ref 135–145)

## 2015-09-07 LAB — TROPONIN I
Troponin I: 0.05 ng/mL — ABNORMAL HIGH (ref <0.031)
Troponin I: 0.06 ng/mL — ABNORMAL HIGH (ref <0.031)

## 2015-09-07 LAB — I-STAT TROPONIN, ED: Troponin i, poc: 0.05 ng/mL (ref 0.00–0.08)

## 2015-09-07 LAB — MRSA PCR SCREENING: MRSA BY PCR: POSITIVE — AB

## 2015-09-07 LAB — GLUCOSE, CAPILLARY: Glucose-Capillary: 161 mg/dL — ABNORMAL HIGH (ref 65–99)

## 2015-09-07 MED ORDER — MONTELUKAST SODIUM 10 MG PO TABS
10.0000 mg | ORAL_TABLET | Freq: Every day | ORAL | Status: DC
  Administered 2015-09-07 – 2015-09-09 (×3): 10 mg via ORAL
  Filled 2015-09-07 (×3): qty 1

## 2015-09-07 MED ORDER — TIOTROPIUM BROMIDE MONOHYDRATE 18 MCG IN CAPS
18.0000 ug | ORAL_CAPSULE | Freq: Every day | RESPIRATORY_TRACT | Status: DC
  Administered 2015-09-08 – 2015-09-10 (×3): 18 ug via RESPIRATORY_TRACT
  Filled 2015-09-07: qty 5

## 2015-09-07 MED ORDER — ALBUTEROL SULFATE HFA 108 (90 BASE) MCG/ACT IN AERS
2.0000 | INHALATION_SPRAY | Freq: Four times a day (QID) | RESPIRATORY_TRACT | Status: DC | PRN
  Filled 2015-09-07: qty 6.7

## 2015-09-07 MED ORDER — MAGNESIUM HYDROXIDE 400 MG/5ML PO SUSP
15.0000 mL | Freq: Every day | ORAL | Status: DC | PRN
  Filled 2015-09-07: qty 30

## 2015-09-07 MED ORDER — OXYCODONE HCL 5 MG PO TABS
5.0000 mg | ORAL_TABLET | Freq: Four times a day (QID) | ORAL | Status: DC | PRN
  Administered 2015-09-07 – 2015-09-10 (×9): 5 mg via ORAL
  Filled 2015-09-07 (×9): qty 1

## 2015-09-07 MED ORDER — NITROGLYCERIN IN D5W 200-5 MCG/ML-% IV SOLN
2.0000 ug/min | INTRAVENOUS | Status: DC
  Administered 2015-09-07: 5 ug/min via INTRAVENOUS
  Administered 2015-09-08: 15 ug/min via INTRAVENOUS
  Filled 2015-09-07 (×2): qty 250

## 2015-09-07 MED ORDER — HYDROCODONE-ACETAMINOPHEN 7.5-325 MG PO TABS
1.0000 | ORAL_TABLET | Freq: Four times a day (QID) | ORAL | Status: DC | PRN
  Administered 2015-09-08 – 2015-09-10 (×5): 1 via ORAL
  Filled 2015-09-07 (×5): qty 1

## 2015-09-07 MED ORDER — SALINE SPRAY 0.65 % NA SOLN
1.0000 | Freq: Four times a day (QID) | NASAL | Status: DC
  Administered 2015-09-08 – 2015-09-09 (×4): 1 via NASAL
  Filled 2015-09-07: qty 44

## 2015-09-07 MED ORDER — LOSARTAN POTASSIUM 50 MG PO TABS
50.0000 mg | ORAL_TABLET | Freq: Every day | ORAL | Status: DC
Start: 2015-09-07 — End: 2015-09-09
  Administered 2015-09-07 – 2015-09-09 (×3): 50 mg via ORAL
  Filled 2015-09-07 (×3): qty 1

## 2015-09-07 MED ORDER — GI COCKTAIL ~~LOC~~
30.0000 mL | Freq: Once | ORAL | Status: AC
  Administered 2015-09-07: 30 mL via ORAL
  Filled 2015-09-07: qty 30

## 2015-09-07 MED ORDER — ASPIRIN EC 81 MG PO TBEC
81.0000 mg | DELAYED_RELEASE_TABLET | Freq: Every day | ORAL | Status: DC
  Administered 2015-09-08 – 2015-09-10 (×3): 81 mg via ORAL
  Filled 2015-09-07 (×3): qty 1

## 2015-09-07 MED ORDER — PANTOPRAZOLE SODIUM 40 MG PO TBEC
40.0000 mg | DELAYED_RELEASE_TABLET | Freq: Every day | ORAL | Status: DC
  Administered 2015-09-07 – 2015-09-09 (×4): 40 mg via ORAL
  Filled 2015-09-07 (×4): qty 1

## 2015-09-07 MED ORDER — OMEGA-3-ACID ETHYL ESTERS 1 G PO CAPS
2.0000 g | ORAL_CAPSULE | Freq: Every morning | ORAL | Status: DC
  Administered 2015-09-07 – 2015-09-10 (×3): 2 g via ORAL
  Filled 2015-09-07 (×4): qty 2

## 2015-09-07 MED ORDER — LACTULOSE 10 GM/15ML PO SOLN
15.0000 g | Freq: Every day | ORAL | Status: DC
  Administered 2015-09-08: 15 g via ORAL
  Filled 2015-09-07 (×2): qty 30

## 2015-09-07 MED ORDER — FUROSEMIDE 20 MG PO TABS
20.0000 mg | ORAL_TABLET | Freq: Every day | ORAL | Status: DC
  Administered 2015-09-07 – 2015-09-10 (×4): 20 mg via ORAL
  Filled 2015-09-07 (×4): qty 1

## 2015-09-07 MED ORDER — MUPIROCIN 2 % EX OINT
1.0000 "application " | TOPICAL_OINTMENT | Freq: Two times a day (BID) | CUTANEOUS | Status: DC
  Administered 2015-09-07 – 2015-09-10 (×6): 1 via NASAL
  Filled 2015-09-07 (×2): qty 22

## 2015-09-07 MED ORDER — RANOLAZINE ER 500 MG PO TB12
1000.0000 mg | ORAL_TABLET | Freq: Two times a day (BID) | ORAL | Status: DC
  Administered 2015-09-07 – 2015-09-10 (×6): 1000 mg via ORAL
  Filled 2015-09-07 (×6): qty 2

## 2015-09-07 MED ORDER — METOPROLOL TARTRATE 25 MG PO TABS: 75.0000 mg | ORAL_TABLET | Freq: Two times a day (BID) | ORAL | Status: DC

## 2015-09-07 MED ORDER — ZOLPIDEM TARTRATE 5 MG PO TABS: 5.0000 mg | ORAL_TABLET | Freq: Every evening | ORAL | Status: DC | PRN

## 2015-09-07 MED ORDER — FLUTICASONE PROPIONATE 50 MCG/ACT NA SUSP
1.0000 | Freq: Every day | NASAL | Status: DC
  Administered 2015-09-08 – 2015-09-10 (×3): 1 via NASAL
  Filled 2015-09-07: qty 16

## 2015-09-07 MED ORDER — FLEET ENEMA 7-19 GM/118ML RE ENEM
1.0000 | ENEMA | Freq: Two times a day (BID) | RECTAL | Status: DC | PRN
  Filled 2015-09-07: qty 1

## 2015-09-07 MED ORDER — ONDANSETRON HCL 4 MG/2ML IJ SOLN
4.0000 mg | Freq: Four times a day (QID) | INTRAMUSCULAR | Status: DC | PRN
  Administered 2015-09-07: 4 mg via INTRAVENOUS
  Filled 2015-09-07: qty 2

## 2015-09-07 MED ORDER — RIVAROXABAN 20 MG PO TABS
20.0000 mg | ORAL_TABLET | Freq: Every day | ORAL | Status: DC
  Administered 2015-09-07 – 2015-09-09 (×3): 20 mg via ORAL
  Filled 2015-09-07 (×4): qty 1

## 2015-09-07 MED ORDER — CO Q 10 100 MG PO CAPS: 100.0000 mg | ORAL_CAPSULE | Freq: Every day | ORAL | Status: DC

## 2015-09-07 MED ORDER — LINAGLIPTIN 5 MG PO TABS
5.0000 mg | ORAL_TABLET | Freq: Every day | ORAL | Status: DC
  Administered 2015-09-08 – 2015-09-10 (×3): 5 mg via ORAL
  Filled 2015-09-07 (×3): qty 1

## 2015-09-07 MED ORDER — BUDESONIDE-FORMOTEROL FUMARATE 160-4.5 MCG/ACT IN AERO
2.0000 | INHALATION_SPRAY | Freq: Two times a day (BID) | RESPIRATORY_TRACT | Status: DC
  Administered 2015-09-08 – 2015-09-10 (×5): 2 via RESPIRATORY_TRACT
  Filled 2015-09-07 (×2): qty 6

## 2015-09-07 MED ORDER — INSULIN ASPART 100 UNIT/ML ~~LOC~~ SOLN
0.0000 [IU] | Freq: Three times a day (TID) | SUBCUTANEOUS | Status: DC
  Administered 2015-09-08: 2 [IU] via SUBCUTANEOUS
  Administered 2015-09-08 – 2015-09-09 (×4): 3 [IU] via SUBCUTANEOUS
  Administered 2015-09-09 – 2015-09-10 (×2): 2 [IU] via SUBCUTANEOUS

## 2015-09-07 MED ORDER — INSULIN ASPART 100 UNIT/ML ~~LOC~~ SOLN: 0.0000 [IU] | Freq: Every day | SUBCUTANEOUS | Status: DC

## 2015-09-07 MED ORDER — ATORVASTATIN CALCIUM 80 MG PO TABS
80.0000 mg | ORAL_TABLET | Freq: Every day | ORAL | Status: DC
  Administered 2015-09-07 – 2015-09-10 (×4): 80 mg via ORAL
  Filled 2015-09-07 (×4): qty 1

## 2015-09-07 MED ORDER — ZOLPIDEM TARTRATE 5 MG PO TABS: 10.0000 mg | ORAL_TABLET | Freq: Every evening | ORAL | Status: DC | PRN

## 2015-09-07 MED ORDER — CHLORHEXIDINE GLUCONATE CLOTH 2 % EX PADS
6.0000 | MEDICATED_PAD | Freq: Every day | CUTANEOUS | Status: DC
  Administered 2015-09-08 – 2015-09-10 (×3): 6 via TOPICAL

## 2015-09-07 MED ORDER — MAGNESIUM HYDROXIDE 400 MG/5ML PO SUSP
30.0000 mL | Freq: Once | ORAL | Status: AC
  Administered 2015-09-07: 30 mL via ORAL
  Filled 2015-09-07: qty 30

## 2015-09-07 MED ORDER — HEPARIN (PORCINE) IN NACL 100-0.45 UNIT/ML-% IJ SOLN
1300.0000 [IU]/h | INTRAMUSCULAR | Status: DC
  Filled 2015-09-07: qty 250

## 2015-09-07 MED ORDER — SERTRALINE HCL 50 MG PO TABS
25.0000 mg | ORAL_TABLET | Freq: Every day | ORAL | Status: DC
  Administered 2015-09-07 – 2015-09-10 (×4): 25 mg via ORAL
  Filled 2015-09-07 (×4): qty 1

## 2015-09-07 MED ORDER — CARVEDILOL 25 MG PO TABS
25.0000 mg | ORAL_TABLET | Freq: Two times a day (BID) | ORAL | Status: DC
  Administered 2015-09-08: 25 mg via ORAL
  Filled 2015-09-07: qty 1

## 2015-09-07 MED ORDER — POLYETHYLENE GLYCOL 3350 17 G PO PACK
17.0000 g | PACK | Freq: Two times a day (BID) | ORAL | Status: DC
  Administered 2015-09-07 – 2015-09-09 (×3): 17 g via ORAL
  Filled 2015-09-07 (×3): qty 1

## 2015-09-07 MED ORDER — DOCUSATE SODIUM 100 MG PO CAPS
200.0000 mg | ORAL_CAPSULE | Freq: Two times a day (BID) | ORAL | Status: DC
  Administered 2015-09-07 – 2015-09-10 (×6): 200 mg via ORAL
  Filled 2015-09-07 (×7): qty 2

## 2015-09-07 MED ORDER — CLOPIDOGREL BISULFATE 75 MG PO TABS
75.0000 mg | ORAL_TABLET | Freq: Every day | ORAL | Status: DC
  Administered 2015-09-07 – 2015-09-10 (×4): 75 mg via ORAL
  Filled 2015-09-07 (×6): qty 1

## 2015-09-07 MED ORDER — ACETAMINOPHEN 325 MG PO TABS: 650.0000 mg | ORAL_TABLET | ORAL | Status: DC | PRN

## 2015-09-07 MED ORDER — POTASSIUM CHLORIDE CRYS ER 10 MEQ PO TBCR
10.0000 meq | EXTENDED_RELEASE_TABLET | Freq: Every day | ORAL | Status: DC
  Administered 2015-09-07 – 2015-09-10 (×4): 10 meq via ORAL
  Filled 2015-09-07 (×4): qty 1

## 2015-09-07 NOTE — ED Notes (Signed)
Pt very concerned with constipation-- Dr. Lacinda Axon notified.

## 2015-09-07 NOTE — ED Notes (Signed)
Pt's MRSA test was positive on 09/01/15. Pt placed on contact precautions.

## 2015-09-07 NOTE — Progress Notes (Signed)
ANTICOAGULATION CONSULT NOTE - Initial Consult  Pharmacy Consult for heparin Indication: chest pain/ACS in setting of hx PE with lupus AC  Allergies  Allergen Reactions  . Ace Inhibitors Cough    Cough  . Gabapentin Other (See Comments)    WAS BEHAVING VERY ODDLY    Patient Measurements: Height: 5\' 8"  (172.7 cm) Weight: 174 lb (78.926 kg) IBW/kg (Calculated) : 68.4  Vital Signs: Temp: 97.5 F (36.4 C) (02/23 0730) Temp Source: Oral (02/23 0730) BP: 172/95 mmHg (02/23 1219) Pulse Rate: 93 (02/23 1219)  Labs:  Recent Labs  09/05/15 0546 09/05/15 1336 09/06/15 0539 09/06/15 1152 09/07/15 0801  HGB 10.5*  --  9.2*  --  10.4*  HCT 33.7*  --  29.8*  --  32.9*  PLT 577*  --  469*  --  543*  APTT 64*  --   --  68*  --   LABPROT  --  16.0*  --   --   --   INR  --  1.27  --   --   --   HEPARINUNFRC 0.28*  --   --  0.13*  --   CREATININE 1.05  --  1.21  --  1.02    Estimated Creatinine Clearance: 60.5 mL/min (by C-G formula based on Cr of 1.02).   Assessment: 75 yo male admitted 2/22 after being discharged 24 hours earlier with increased chest pain. PTA pt was on xarelto; however, during his last admission it was held for a period of days but was resumed upon discharge. Despite best efforts it is  Unknown when or if pt received Xarelto at SNF PTA. Will order baseline HL and aPTT, although Lupus AC complicates use of aPTT, and trend labs as needed for guidance.  Goal of Therapy:  Heparin level 0.3-0.7 units/ml Monitor platelets by anticoagulation protocol: Yes   Plan:  1. Heparin to begin at 1300 units/hr; no bolus as unknown when or if xarelto received PTA 2. HL and aPTT at baseline then at 6 hours to help guide heparin dosing 3. F/u resuming xarelto    Vincenza Hews, PharmD, BCPS 09/07/2015, 12:51 PM Pager: [phone]

## 2015-09-07 NOTE — ED Provider Notes (Signed)
CSN: [id number]     Arrival date & time 09/07/15  0722 History   First MD Initiated Contact with Patient 09/07/15 [phone]     Chief Complaint  Patient presents with  . Chest Pain  . Constipation     (Consider location/radiation/quality/duration/timing/severity/associated sxs/prior Treatment) HPI.... Complex patient with multiple medical problems including profound coronary artery disease, hypertension, diabetes, dyslipidemia, and many others presents with chest pain.  He was just discharged yesterday from the [facility]. Status post cardiac catheterization on September 05, 2015 by Dr. Ellyn Hack revealing a heavily calcified ostial circumflex lesion that goes back to the left main and restenosis of the proximal circumflex stent. Recommendation at that time was for a rotational atherectomy.  Additionally patient has a right lower extremity prosthesis.  No frank dyspnea, diaphoresis, nausea. Severity of symptoms as moderate.  Past Medical History  Diagnosis Date  . Coronary atherosclerosis of native coronary artery     a. NSTEMI 2005 s/p CABG (LIMA - LAD, SVG - OM, SVG - PDA). b. DES to Cx 02/2005. c. NSTEMI 07/2010 due to occ Cx - med rx. Known occluded vein grafts.  . Essential hypertension, benign   . Type 2 diabetes mellitus ([place])   . Dyslipidemia   . ACE inhibitor intolerance   . Asthma   . Recurrent pulmonary embolism ([place])     a. 01/2011. b. Bilateral PE 11/2011 - + lupus anticoagulant preliminary testing. Felt to require lifelong Coumadin.  . Pulmonary nodule     RML by CT 11/2011, stable compared to 2012.  Marland Kitchen Cholelithiasis   . Pulmonary HTN ([place])     Echo 11/2011 at time of PE. PA pressure not assessed on 03/2012 echo but RV was back to normal.  . Lupus anticoagulant positive   . GERD (gastroesophageal reflux disease)   . COPD (chronic obstructive pulmonary disease) ([place])   . OSA (obstructive sleep apnea)    Past Surgical History  Procedure Laterality Date  . Coronary artery bypass  graft  2005     LIMA to LAD, SVG to OM, SVG to RCA  . L4-l5 laminectomy    . Colonoscopy    . Esophagogastroduodenoscopy N/A 12/19/2013    Procedure: ESOPHAGOGASTRODUODENOSCOPY (EGD);  Surgeon: Lafayette Dragon, MD;  Location: [facility] ENDOSCOPY;  Service: Endoscopy;  Laterality: N/A;  . Kidney stone removed    . Cardiac catheterization N/A 09/05/2015    Procedure: Left Heart Cath and Cors/Grafts Angiography;  Surgeon: Leonie Man, MD;  Location: [place] CV LAB;  Service: Cardiovascular;  Laterality: N/A;   Family History  Problem Relation Age of Onset  . Heart disease Father   . Heart disease Sister   . Breast cancer Sister   . Heart disease Brother    Social History  Substance Use Topics  . Smoking status: Former Smoker -- 0.30 packs/day for 30 years    Types: Cigarettes    Start date: 09/28/1945    Quit date: 07/15/1972  . Smokeless tobacco: Current User    Types: Chew     Comment: chews 1/2 pack tobacco per day  . Alcohol Use: No    Review of Systems  All other systems reviewed and are negative.     Allergies  Ace inhibitors and Gabapentin  Home Medications   Prior to Admission medications   Medication Sig Start Date End Date Taking? Authorizing Provider  acetaminophen (TYLENOL) 325 MG tablet Take 650 mg by mouth every 4 (four) hours as needed for mild pain. For pain  Yes Historical Provider, MD  albuterol (PROAIR HFA) 108 (90 BASE) MCG/ACT inhaler Inhale 2 puffs into the lungs every 6 (six) hours as needed for wheezing or shortness of breath.  02/11/14 02/11/16 Yes Historical Provider, MD  atorvastatin (LIPITOR) 80 MG tablet Take 1 tablet (80 mg total) by mouth daily. 02/09/15  Yes Geradine Girt, DO  budesonide-formoterol (SYMBICORT) 160-4.5 MCG/ACT inhaler Inhale 2 puffs into the lungs 2 (two) times daily.   Yes Historical Provider, MD  clopidogrel (PLAVIX) 75 MG tablet Take 1 tablet (75 mg total) by mouth daily. 09/06/15  Yes Oswald Hillock, MD  Coenzyme Q10 (CO Q 10)  100 MG CAPS Take 100 mg by mouth daily.   Yes Historical Provider, MD  docusate sodium (COLACE) 100 MG capsule Take 100 mg by mouth 2 (two) times daily.   Yes Historical Provider, MD  fish oil-omega-3 fatty acids 1000 MG capsule Take 2 g by mouth every morning.    Yes Historical Provider, MD  fluticasone (FLONASE) 50 MCG/ACT nasal spray Place 1 spray into both nostrils daily.    Yes Historical Provider, MD  furosemide (LASIX) 20 MG tablet Take 20 mg by mouth daily.   Yes Historical Provider, MD  HYDROcodone-acetaminophen (NORCO) 7.5-325 MG tablet Take 1 tablet by mouth every 6 (six) hours as needed for moderate pain. 09/06/15  Yes Oswald Hillock, MD  isosorbide mononitrate (IMDUR) 120 MG 24 hr tablet Take 120 mg by mouth daily.   Yes Historical Provider, MD  lactulose (CHRONULAC) 10 GM/15ML solution Take 15 g by mouth daily.    Yes Historical Provider, MD  losartan (COZAAR) 100 MG tablet Take 0.5 tablets (50 mg total) by mouth daily. 09/06/15  Yes Oswald Hillock, MD  magnesium hydroxide (MILK OF MAGNESIA) 400 MG/5ML suspension Take 15 mLs by mouth daily as needed for mild constipation.   Yes Historical Provider, MD  metoprolol (LOPRESSOR) 50 MG tablet TAKE 1 AND 1/2 TABLETS BY MOUTH TWICE DAILY 05/22/15  Yes Satira Sark, MD  montelukast (SINGULAIR) 10 MG tablet Take 10 mg by mouth at bedtime.   Yes Historical Provider, MD  nitroGLYCERIN (NITROSTAT) 0.4 MG SL tablet Place 1 tablet (0.4 mg total) under the tongue every 5 (five) minutes x 3 doses as needed. For chest pain 04/05/14  Yes Satira Sark, MD  omeprazole (PRILOSEC) 40 MG capsule Take 40 mg by mouth every evening.   Yes Historical Provider, MD  oxyCODONE (OXY IR/ROXICODONE) 5 MG immediate release tablet Take 5 mg by mouth every 6 (six) hours as needed for moderate pain or severe pain.   Yes Historical Provider, MD  polyethylene glycol (MIRALAX / GLYCOLAX) packet Take 17 g by mouth daily.   Yes Historical Provider, MD  potassium chloride  (K-DUR) 10 MEQ tablet Take 10 mEq by mouth every morning.   Yes Historical Provider, MD  potassium chloride (K-DUR,KLOR-CON) 10 MEQ tablet Take 10 mEq by mouth daily.   Yes Historical Provider, MD  ranolazine (RANEXA) 1000 MG SR tablet Take 1 tablet (1,000 mg total) by mouth 2 (two) times daily. 08/30/15  Yes Kelvin Cellar, MD  rivaroxaban (XARELTO) 20 MG TABS tablet Take 20 mg by mouth daily.   Yes Historical Provider, MD  sertraline (ZOLOFT) 25 MG tablet Take 1 tablet (25 mg total) by mouth daily. 08/30/15  Yes Kelvin Cellar, MD  sitaGLIPtin (JANUVIA) 25 MG tablet Take 1 tablet (25 mg total) by mouth daily. 02/09/15  Yes Geradine Girt, DO  sodium chloride (  OCEAN) 0.65 % SOLN nasal spray Place 1 spray into both nostrils 4 (four) times daily.   Yes Historical Provider, MD  tiotropium (SPIRIVA) 18 MCG inhalation capsule Place 1 capsule (18 mcg total) into inhaler and inhale daily. 11/16/14  Yes Juanito Doom, MD  zinc oxide 20 % ointment Apply topically every Monday. 09/06/15  Yes Oswald Hillock, MD  zolpidem (AMBIEN) 10 MG tablet Take 1 tablet (10 mg total) by mouth at bedtime as needed for sleep. 09/06/15  Yes Oswald Hillock, MD   BP 171/93 mmHg  Pulse 97  Temp(Src) 97.5 F (36.4 C) (Oral)  Resp 15  Ht 5\' 8"  (1.727 m)  Wt 174 lb (78.926 kg)  BMI 26.46 kg/m2  SpO2 98% Physical Exam  Constitutional: He is oriented to person, place, and time. He appears well-developed and well-nourished.  HENT:  Head: Normocephalic and atraumatic.  Eyes: Conjunctivae and EOM are normal. Pupils are equal, round, and reactive to light.  Neck: Normal range of motion. Neck supple.  Cardiovascular: Normal rate and regular rhythm.   Pulmonary/Chest: Effort normal and breath sounds normal.  Abdominal: Soft. Bowel sounds are normal.  Musculoskeletal:  Metallic cage on right lower extremity from knee distally  Neurological: He is alert and oriented to person, place, and time.  Skin: Skin is warm and dry.   Psychiatric: He has a normal mood and affect. His behavior is normal.  Nursing note and vitals reviewed.   ED Course  Procedures (including critical care time) Labs Review Labs Reviewed  BASIC METABOLIC PANEL - Abnormal; Notable for the following:    Glucose, Bld 142 (*)    All other components within normal limits  CBC - Abnormal; Notable for the following:    WBC 11.6 (*)    RBC 3.91 (*)    Hemoglobin 10.4 (*)    HCT 32.9 (*)    RDW 17.2 (*)    Platelets 543 (*)    All other components within normal limits  I-STAT TROPOININ, ED    Imaging Review Dg Chest 2 View  09/07/2015  CLINICAL DATA:  Chest pain and shortness of breath since last evening. EXAM: CHEST  2 VIEW COMPARISON:  08/31/2015 FINDINGS: The heart is mildly enlarged but stable. There is mild tortuosity and calcification of the thoracic aorta. Stable surgical changes from bypass surgery. There is mild vascular congestion without overt pulmonary edema. There are small bilateral pleural effusions without definite infiltrates. Stable eventration of the right hemidiaphragm. IMPRESSION: Mild cardiac enlargement and moderate vascular congestion without overt pulmonary edema. Small bilateral pleural effusions with overlying atelectasis. Electronically Signed   By: Marijo Sanes M.D.   On: 09/07/2015 08:18   I have personally reviewed and evaluated these images and lab results as part of my medical decision-making.   EKG Interpretation None      MDM   Final diagnoses:  Chest pain, unspecified chest pain type    Complex patient with profound coronary artery disease presents with chest pain. He is hemodynamically  stable. Will consult cardiology.    Nat Christen, MD 09/07/15 1016

## 2015-09-07 NOTE — ED Notes (Signed)
To ED via [place] from [facility]-- with c/o chest pain-- was d/c'd from hospital yesterday-- states has not had a bowel movement since Sunday-- states that if he had an enema and " get my bowels cleaned out, I will feel better" c/o epigastric pain at present.  Pt has a "CAGE" on right lower leg placed December 4th-- at [facility].

## 2015-09-07 NOTE — ED Notes (Signed)
Report given to [place], Therapist, sports on [place]

## 2015-09-07 NOTE — Consult Note (Signed)
CARDIOLOGY HISTORY AND PHYSICAL   Patient ID: Henry [name] MRN: [id number] DOB/AGE: 1941-05-30 75 y.o.  Admit date: 09/07/2015  Primary Physician   Sherrie Mustache, MD Primary Cardiologist   Dr Domenic Polite Reason for Consultation   Chest pain  [id number] Henry [name] is a 75 y.o. 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 Other hx is COPD and Lupus.   He has a ruptured Achilles tendon and has an external fixation device in place on his right lower extremity, managed by an orthopedist at [facility].  Admit 02/12-02/14 for chest pain, med rx recommended.  Admit 02/15-02/22 after admit for CVA, Cards saw for elevated troponin and chest pain, s/p cath 02/21, results below, med rx recommended. Cath showed severe calcified LCx stenosis, a very complex lesion. The RCA is occluded and fillsfrom left to right collaterals. LIMA-LAD is his only patent graft.  [name] admits that his chest pain level has not been 0 for well over a week. He states he constantly has chest pressure at a 2-3/10. He went to the nursing home yesterday for rehabilitation. He has been constipated, he believes from narcotics for his leg pain. Today, he was straining at bowel, and his chest pain increased to a 10/10. He was given sublingual nitroglycerin and a total of 4 baby aspirins between facility staff and EMS. His pain improved. He is currently having chest pain at a 6 or 7/10. He also describes some nausea and lower abdominal discomfort from constipation. He had some shortness of breath when the pain was severe but feels okay now.   Past Medical History  Diagnosis Date  . Coronary atherosclerosis of native coronary artery     a. NSTEMI 2005 s/p CABG (LIMA - LAD, SVG - OM, SVG - PDA). b. DES to Cx 02/2005. c. NSTEMI 07/2010 due to occ Cx - med rx. Known occluded vein grafts.  . Essential  hypertension, benign   . Type 2 diabetes mellitus ([place])   . Dyslipidemia   . ACE inhibitor intolerance   . Asthma   . Recurrent pulmonary embolism ([place])     a. 01/2011. b. Bilateral PE 11/2011 - + lupus anticoagulant preliminary testing. Felt to require lifelong Coumadin.  . Pulmonary nodule     RML by CT 11/2011, stable compared to 2012.  Marland Kitchen Cholelithiasis   . Pulmonary HTN ([place])     Echo 11/2011 at time of PE. PA pressure not assessed on 03/2012 echo but RV was back to normal.  . Lupus anticoagulant positive   . GERD (gastroesophageal reflux disease)   . COPD (chronic obstructive pulmonary disease) ([place])   . OSA (obstructive sleep apnea)      Past Surgical History  Procedure Laterality Date  . Coronary artery bypass graft  2005     LIMA to LAD, SVG to OM, SVG to RCA  . L4-l5 laminectomy    . Colonoscopy    . Esophagogastroduodenoscopy N/A 12/19/2013    Procedure: ESOPHAGOGASTRODUODENOSCOPY (EGD);  Surgeon: Lafayette Dragon, MD;  Location: [facility] ENDOSCOPY;  Service: Endoscopy;  Laterality: N/A;  . Kidney stone removed    . Cardiac catheterization N/A 09/05/2015    Procedure: Left Heart Cath and Cors/Grafts Angiography;  Surgeon: Leonie Man, MD;  Location: [facility] CV LAB;  Service: Cardiovascular;  Laterality: N/A;    Allergies  Allergen Reactions  . Ace Inhibitors Cough    Cough  . Gabapentin Other (See Comments)    WAS BEHAVING VERY ODDLY    I have reviewed the patient's current medications Medication Sig  acetaminophen (TYLENOL) 325 MG tablet Take 650 mg by mouth every 4 (four) hours as needed for mild pain. For pain  albuterol (PROAIR HFA) 108 (90 BASE) MCG/ACT inhaler Inhale 2 puffs into the lungs every 6 (six) hours as needed for wheezing or shortness of breath.   atorvastatin (LIPITOR) 80 MG tablet Take 1 tablet (80 mg total) by mouth daily.  budesonide-formoterol (SYMBICORT) 160-4.5 MCG/ACT inhaler Inhale 2 puffs into the lungs 2 (two) times daily.  clopidogrel (PLAVIX)  75 MG tablet Take 1 tablet (75 mg total) by mouth daily.  Coenzyme Q10 (CO Q 10) 100 MG CAPS Take 100 mg by mouth daily.  docusate sodium (COLACE) 100 MG capsule Take 100 mg by mouth 2 (two) times daily.  fish oil-omega-3 fatty acids 1000 MG capsule Take 2 g by mouth every morning.   fluticasone (FLONASE) 50 MCG/ACT nasal spray Place 1 spray into both nostrils daily.   furosemide (LASIX) 20 MG tablet Take 20 mg by mouth daily.  HYDROcodone-acetaminophen (NORCO) 7.5-325 MG tablet Take 1 tablet by mouth every 6 (six) hours as needed for moderate pain.  isosorbide mononitrate (IMDUR) 120 MG 24 hr tablet Take 120 mg by mouth daily.  lactulose (CHRONULAC) 10 GM/15ML solution Take 15 g by mouth daily.   losartan (COZAAR) 100 MG tablet Take 0.5 tablets (50 mg total) by mouth daily.  magnesium hydroxide (MILK OF MAGNESIA) 400 MG/5ML suspension Take 15 mLs by mouth daily as needed for mild constipation.  metoprolol (LOPRESSOR) 50 MG tablet TAKE 1 AND 1/2 TABLETS BY MOUTH TWICE DAILY  montelukast (SINGULAIR) 10 MG tablet Take 10 mg by mouth at bedtime.  nitroGLYCERIN (NITROSTAT) 0.4 MG SL tablet Place 1 tablet (0.4 mg total) under the tongue every 5 (five) minutes x 3 doses as needed. For chest pain  omeprazole (PRILOSEC) 40 MG capsule Take 40 mg by mouth every evening.  oxyCODONE (OXY IR/ROXICODONE) 5 MG immediate release tablet Take 5 mg by mouth every 6 (six) hours as needed for moderate pain or severe pain.  polyethylene glycol (MIRALAX / GLYCOLAX) packet Take 17 g by mouth daily.  potassium chloride (K-DUR) 10 MEQ tablet Take 10 mEq by mouth every morning.  potassium chloride (K-DUR,KLOR-CON) 10 MEQ tablet Take 10 mEq by mouth daily.  ranolazine (RANEXA) 1000 MG SR tablet Take 1 tablet (1,000 mg total) by mouth 2 (two) times daily.  rivaroxaban (XARELTO) 20 MG TABS tablet Take 20 mg by mouth daily.  sertraline (ZOLOFT) 25 MG tablet Take 1 tablet (25 mg total) by mouth daily.  sitaGLIPtin (JANUVIA)  25 MG tablet Take 1 tablet (25 mg total) by mouth daily.  sodium chloride (OCEAN) 0.65 % SOLN nasal spray Place 1 spray into both nostrils 4 (four) times daily.  tiotropium (SPIRIVA) 18 MCG inhalation capsule Place 1 capsule (18 mcg total) into inhaler and inhale daily.  zinc oxide 20 % ointment Apply topically every Monday.  zolpidem (AMBIEN) 10 MG tablet Take 1 tablet (10 mg total) by mouth at bedtime as needed for sleep.     Social History   Social History  . Marital Status: Married    Spouse Name: N/A  . Number of Children: N/A  . Years of Education: N/A   Occupational History  . Retired    Social History  Main Topics  . Smoking status: Former Smoker -- 0.30 packs/day for 30 years    Types: Cigarettes    Start date: 09/28/1945    Quit date: 07/15/1972  . Smokeless tobacco: Current User    Types: Chew     Comment: chews 1/2 pack tobacco per day  . Alcohol Use: No  . Drug Use: No  . Sexual Activity: Not on file   Other Topics Concern  . Not on file   Social History Narrative   Married, retired.     Family Status  Relation Status Death Age  . Mother Deceased   . Father Deceased    Family History  Problem Relation Age of Onset  . Heart disease Father   . Heart disease Sister   . Breast cancer Sister   . Heart disease Brother      ROS:  Full 14 point review of systems complete and found to be negative unless listed above.  Physical Exam: Blood pressure 162/96, pulse 94, temperature 97.5 F (36.4 C), temperature source Oral, resp. rate 17, height 5\' 8"  (1.727 m), weight 174 lb (78.926 kg), SpO2 94 %.  General: Well developed, well nourished, male in no acute distress Head: Eyes PERRLA, No xanthomas.   Normocephalic and atraumatic, oropharynx without edema or exudate. Dentition:  Lungs:  Heart: HRRR S1 S2, no rub/gallop, Heart irregular rate and rhythm with S1, S2  murmur. pulses are 2+ extrem.   Neck: No carotid bruits. No lymphadenopathy.  JVD. Abdomen: Bowel  sounds present, abdomen soft and non-tender without masses or hernias noted. Msk:  No spine or cva tenderness. No weakness, no joint deformities or effusions. Extremities: No clubbing or cyanosis.  edema.  Neuro: Alert and oriented X 3. No focal deficits noted. Psych:  Good affect, responds appropriately Skin: No rashes or lesions noted.  Labs:   Lab Results  Component Value Date   WBC 11.6* 09/07/2015   HGB 10.4* 09/07/2015   HCT 32.9* 09/07/2015   MCV 84.1 09/07/2015   PLT 543* 09/07/2015    Recent Labs  09/05/15 1336  INR 1.27     Recent Labs Lab 09/07/15 0801  NA 136  K 4.2  CL 101  CO2 24  BUN 11  CREATININE 1.02  CALCIUM 9.0  GLUCOSE 142*    Recent Labs  09/07/15 0800  TROPIPOC 0.05   Lab Results  Component Value Date   CHOL 93 08/31/2015   HDL 31* 08/31/2015   LDLCALC 45 08/31/2015   TRIG 87 08/31/2015   Echo: 08/29/2015 - Left ventricle: The cavity size was normal. Wall thickness was increased in a pattern of mild LVH. Systolic function was normal. The estimated ejection fraction was in the range of 60% to 65%. Wall motion was normal; there were no regional wall motion abnormalities. Features are consistent with a pseudonormal left ventricular filling pattern, with concomitant abnormal relaxation and increased filling pressure (grade 2 diastolic dysfunction). Doppler parameters are consistent with high ventricular filling pressure. - Aortic valve: Moderately to severely calcified annulus. Trileaflet; moderately thickened leaflets. Valve area (VTI): 2.1 cm^2. Valve area (Vmax): 2.07 cm^2. - Mitral valve: Moderately calcified annulus. Mildly thickened leaflets . There was mild regurgitation. - Left atrium: The atrium was moderately dilated. - Right atrium: The atrium was mildly dilated. - Technically adequate study.  ECG:  09/07/2015  CATH: 09/05/2015 1. Ost LM to LM lesion, 65% stenosed. 2. Ost Cx lesion, 95%  stenosed. 3. Ost Cx to Prox Cx lesion, 65% stenosed.  The lesion was previously treated with a stent (unknown type) greater than two years ago. 4. Lat 1st Mrg lesion, 100% stenosed - with known occluded SVG. Ost 1st Mrg to 1st Mrg lesion, 85% stenosed. 5. Ost LAD to Mid LAD lesion, 65% stenosed. Mid LAD lesion, 100% stenosed. - Chronic 6. Dist LAD lesion, 75% stenosed. - Chronic 7. Prox RCA to Mid RCA lesion, 100% stenosed. Fills via Cx-RPL Collaterals Mid RCA to Dist RCA lesion, 55% stenosed. 8. LIMA-LAD was injected is normal in caliber, and is anatomically normal.  Radiology:  Dg Chest 2 View 09/07/2015  CLINICAL DATA:  Chest pain and shortness of breath since last evening. EXAM: CHEST  2 VIEW COMPARISON:  08/31/2015 FINDINGS: The heart is mildly enlarged but stable. There is mild tortuosity and calcification of the thoracic aorta. Stable surgical changes from bypass surgery. There is mild vascular congestion without overt pulmonary edema. There are small bilateral pleural effusions without definite infiltrates. Stable eventration of the right hemidiaphragm. IMPRESSION: Mild cardiac enlargement and moderate vascular congestion without overt pulmonary edema. Small bilateral pleural effusions with overlying atelectasis. Electronically Signed   By: Marijo Sanes M.D.   On: 09/07/2015 08:18    ASSESSMENT AND PLAN:   The patient was seen today by Dr. Acie Fredrickson, the patient evaluated and the data reviewed.  Principal Problem: 1.  Angina pectoris ([place]) -He has had chest pain continuously for over a week. - Known severe CAD with an initial trial of medical therapy recommended. - He is on high doses of Imdur and Ranexa, but they're not successfully controlling his pain. - He is on Plavix without aspirin because of the need for systemic anticoagulation. - Admit, pain control with IV nitroglycerin, hold Imdur - M.D. to review Films again with interventional list to see if there is any possibility of PCI, with  his multiple comorbidities, feel redo bypass surgery is not the best option but we may need TCTS to see.  Active Problems: 2.  Essential hypertension, benign - BP is high now, but he has not had his morning medications. - Restart  3.  Long term current use of anticoagulant therapy - On Plavix and Xarelto prior to admission. - Old Xarelto until the determination is made whether or not cardiac catheterization will be performed  4.  Type 2 diabetes mellitus (HCC) - Add sliding scale insulin. - Continue Januvia  5.  CKD (chronic kidney disease), stage II - Follow  6.  Chronic diastolic CHF (congestive heart failure) (HCC) - Continue Lasix and daily weights. He has some vascular congestion, but his neck veins are only minimally elevated. We will check a BNP  7.  Stroke (cerebrum) [facility]) - Reason for admission 08/28/2014  8. Constipation - Increased Miralax to twice a day - Add stool softeners - May need daily milk of magnesia also - Likely secondary to narcotic use as the pain in his leg is significant area   9. Achilles tendon rupture - The M.D. at [facility] was going to discuss removing the external fixation device this week. - Continue it for now, follow-up as soon as possible   Signed: Lenoard Aden 09/07/2015 11:41 AM Beeper [id number]  Co-Sign MD   Attending Note:   The patient was seen and examined.  Agree with assessment and plan as noted above.  Changes made to the above note as needed.  I have reviewed the angiograms with Dr. Angelena Form, [place] and now Dr. Irish Lack. All agree that there are no options for PCI.  Furthermore,  He has collateral filling of the RCA from the LCx so attempting PCI of this stenosis would potentially jeopardize his LCx and RCA.   This would be a life threatening complication .  At this point we do not have any procedure to offer  We will try to maximize his medical therapy - will be very aggressive with our beta blocer  therapy   Thayer Headings, Brooke Bonito., MD, [facility] 09/07/2015, 12:56 PM [address]  [place] Pager [phone]

## 2015-09-08 ENCOUNTER — Encounter (HOSPITAL_COMMUNITY)
Admission: EM | Disposition: A | Discharge status: Skilled Nursing Facility | Payer: Self-pay | Source: Home / Self Care | Attending: Cardiovascular Disease

## 2015-09-08 DIAGNOSIS — Z515 Encounter for palliative care: Secondary | ICD-10-CM

## 2015-09-08 DIAGNOSIS — Z7189 Other specified counseling: Secondary | ICD-10-CM

## 2015-09-08 DIAGNOSIS — I200 Unstable angina: Secondary | ICD-10-CM

## 2015-09-08 DIAGNOSIS — R079 Chest pain, unspecified: Secondary | ICD-10-CM

## 2015-09-08 LAB — TROPONIN I: TROPONIN I: 0.06 ng/mL — AB (ref <0.031)

## 2015-09-08 LAB — GLUCOSE, CAPILLARY
GLUCOSE-CAPILLARY: 122 mg/dL — AB (ref 65–99)
GLUCOSE-CAPILLARY: 199 mg/dL — AB (ref 65–99)
Glucose-Capillary: 163 mg/dL — ABNORMAL HIGH (ref 65–99)
Glucose-Capillary: 180 mg/dL — ABNORMAL HIGH (ref 65–99)

## 2015-09-08 SURGERY — CORONARY STENT INTERVENTION
Anesthesia: LOCAL

## 2015-09-08 MED ORDER — MORPHINE SULFATE (PF) 2 MG/ML IV SOLN
2.0000 mg | INTRAVENOUS | Status: DC | PRN
  Administered 2015-09-09: 2 mg via INTRAVENOUS
  Filled 2015-09-08: qty 1

## 2015-09-08 MED ORDER — SODIUM CHLORIDE 0.9 % IR SOLN: Freq: Once | Status: DC

## 2015-09-08 MED ORDER — ENSURE ENLIVE PO LIQD
237.0000 mL | Freq: Three times a day (TID) | ORAL | Status: DC
  Administered 2015-09-08 – 2015-09-10 (×5): 237 mL via ORAL

## 2015-09-08 MED ORDER — SODIUM CHLORIDE 0.9 % IR SOLN
Freq: Once | Status: DC
  Filled 2015-09-08: qty 500

## 2015-09-08 MED ORDER — CARVEDILOL 25 MG PO TABS
37.5000 mg | ORAL_TABLET | Freq: Two times a day (BID) | ORAL | Status: DC
  Administered 2015-09-08 – 2015-09-10 (×4): 37.5 mg via ORAL
  Filled 2015-09-08 (×4): qty 1

## 2015-09-08 MED ORDER — ENSURE ENLIVE PO LIQD
237.0000 mL | Freq: Two times a day (BID) | ORAL | Status: DC
  Administered 2015-09-08: 237 mL via ORAL

## 2015-09-08 MED ORDER — LORAZEPAM 0.5 MG PO TABS: 0.5000 mg | ORAL_TABLET | ORAL | Status: DC | PRN

## 2015-09-08 MED ORDER — SODIUM CHLORIDE 0.9 % IR SOLN
Freq: Every morning | Status: DC
  Administered 2015-09-08 – 2015-09-10 (×3)
  Filled 2015-09-08: qty 500

## 2015-09-08 MED ORDER — CARVEDILOL 12.5 MG PO TABS
12.5000 mg | ORAL_TABLET | Freq: Once | ORAL | Status: AC
  Administered 2015-09-08: 12.5 mg via ORAL
  Filled 2015-09-08: qty 1

## 2015-09-08 NOTE — NC FL2 (Signed)
[place] LEVEL OF CARE SCREENING TOOL     IDENTIFICATION  Patient Name: Henry [name] Birthdate: 1941-06-28 Sex: male Admission Date (Current Location): 09/07/2015  [facility] and [place] Number:  [employer] and Address:  [facility], [place] [address] [place], [place] [address]      Provider Number: [id number]  Attending Physician Name and Address:  Thayer Headings, MD  Relative Name and Phone Number:       Current Level of Care: Hospital Recommended Level of Care: [place] Prior Approval Number:    Date Approved/Denied:   PASRR Number: [id number]  Discharge Plan: SNF    Current Diagnoses: Patient Active Problem List   Diagnosis Date Noted  . Unstable angina ([place])   . Angina pectoris ([place]) 09/07/2015  . Angina at rest [facility]) 09/07/2015  . Cerebrovascular accident (CVA) due to bilateral stenosis of carotid arteries ([place])   . ACS (acute coronary syndrome) ([place])   . Thrombocytosis ([place]) 08/31/2015  . Hyponatremia 08/31/2015  . Chronic diastolic CHF (congestive heart failure) ([place]) 08/31/2015  . Stroke (cerebrum) ([place]) 08/31/2015  . Acute ischemic stroke ([place])   . Stroke ([place]) 08/30/2015  . Pain in the chest   . HLD (hyperlipidemia)   . 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 Chest pain 08/27/2015  . Lactic acidosis   . Troponin level elevated   . AKI (acute kidney injury) ([place]) 02/05/2015  . Elevated lactic acid level 02/05/2015  . History of gastrointestinal bleeding 02/05/2015  . Atherosclerosis of native coronary artery with angina pectoris ([place])   . COPD exacerbation ([place]) 04/12/2014  . COPD, moderate ([place]) 03/15/2014  . 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 Right carotid bruit 12/18/2013  . 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 History of pulmonary embolism   . Type 2 diabetes mellitus ([place])   . Pulmonary nodule   . Lupus anticoagulant  positive   . Anemia 12/17/2013  . Long term current use of anticoagulant therapy 12/17/2011  . Dyslipidemia 12/14/2011  . Essential hypertension, benign 04/11/2009    Orientation RESPIRATION BLADDER Height & Weight     Self, Time, Situation, Place  Normal Continent Weight: 75.569 kg (166 lb 9.6 oz) Height:  5\' 8"  (172.7 cm)  BEHAVIORAL SYMPTOMS/MOOD NEUROLOGICAL BOWEL NUTRITION STATUS   (NONE)  (NONE) Continent Diet (Heart Healthy)  AMBULATORY STATUS COMMUNICATION OF NEEDS Skin   Extensive Assist Verbally Surgical wounds                       Personal Care Assistance Level of Assistance  Bathing, Feeding, Dressing Bathing Assistance: Limited assistance Feeding assistance: Independent Dressing Assistance: Limited assistance     Functional Limitations Info  Sight, Hearing, Speech Sight Info: Adequate Hearing Info: Adequate Speech Info: Adequate    SPECIAL CARE FACTORS FREQUENCY  PT (By licensed PT), OT (By licensed OT)     PT Frequency: 5/week OT Frequency: 5/week            Contractures      Additional Factors Info  Code Status, Allergies, Insulin Sliding Scale, Isolation Precautions Code Status Info: Full Allergies Info: Ace inhibitors, gabapentin   Insulin Sliding Scale Info: Sliding scale Isolation Precautions Info: MRSA     Current Medications (09/08/2015):  This is the current hospital active medication list Current Facility-Administered Medications  Medication Dose Route Frequency Provider Last Rate Last Dose  . acetaminophen (  TYLENOL) tablet 650 mg  650 mg Oral Q4H PRN Rhonda G Barrett, PA-C      . albuterol (PROVENTIL HFA;VENTOLIN HFA) 108 (90 Base) MCG/ACT inhaler 2 puff  2 puff Inhalation Q6H PRN Rhonda G Barrett, PA-C      . aspirin EC tablet 81 mg  81 mg Oral Daily Rhonda G Barrett, PA-C   81 mg at 09/08/15 1123  . atorvastatin (LIPITOR) tablet 80 mg  80 mg Oral Daily Evelene Croon Barrett, PA-C   80 mg at 09/08/15 1122  . budesonide-formoterol  (SYMBICORT) 160-4.5 MCG/ACT inhaler 2 puff  2 puff Inhalation BID Evelene Croon Barrett, PA-C   2 puff at 09/08/15 0905  . carvedilol (COREG) tablet 37.5 mg  37.5 mg Oral BID WC Thayer Headings, MD      . Chlorhexidine Gluconate Cloth 2 % PADS 6 each  6 each Topical Q0600 Thayer Headings, MD   6 each at 09/08/15 [phone]  . clopidogrel (PLAVIX) tablet 75 mg  75 mg Oral Daily Rhonda G Barrett, PA-C   75 mg at 09/08/15 1120  . docusate sodium (COLACE) capsule 200 mg  200 mg Oral BID Evelene Croon Barrett, PA-C   200 mg at 09/08/15 1122  . feeding supplement (ENSURE ENLIVE) (ENSURE ENLIVE) liquid 237 mL  237 mL Oral TID BM Thayer Headings, MD   237 mL at 09/08/15 1545  . fluticasone (FLONASE) 50 MCG/ACT nasal spray 1 spray  1 spray Each Nare Daily Rhonda G Barrett, PA-C   1 spray at 09/08/15 1126  . furosemide (LASIX) tablet 20 mg  20 mg Oral Daily Rhonda G Barrett, PA-C   20 mg at 09/08/15 1123  . HYDROcodone-acetaminophen (NORCO) 7.5-325 MG per tablet 1 tablet  1 tablet Oral Q6H PRN Lonn [place], PA-C   1 tablet at 09/08/15 0028  . hydrogen peroxide 3 % 10 application in sodium chloride irrigation 0.9 % 500 mL irrigation   Irrigation q morning - 10a Mcarthur Rossetti, MD      . insulin aspart (novoLOG) injection 0-15 Units  0-15 Units Subcutaneous TID WC Evelene Croon Barrett, PA-C   3 Units at 09/08/15 1219  . insulin aspart (novoLOG) injection 0-5 Units  0-5 Units Subcutaneous QHS Rhonda G Barrett, PA-C   0 Units at 09/07/15 2200  . lactulose (CHRONULAC) 10 GM/15ML solution 15 g  15 g Oral Daily Rhonda G Barrett, PA-C   15 g at 09/08/15 1118  . linagliptin (TRADJENTA) tablet 5 mg  5 mg Oral Daily Rhonda G Barrett, PA-C   5 mg at 09/08/15 1123  . losartan (COZAAR) tablet 50 mg  50 mg Oral Daily Rhonda G Barrett, PA-C   50 mg at 09/08/15 1123  . magnesium hydroxide (MILK OF MAGNESIA) suspension 15 mL  15 mL Oral Daily PRN Rhonda G Barrett, PA-C      . montelukast (SINGULAIR) tablet 10 mg  10 mg Oral QHS Rhonda  G Barrett, PA-C   10 mg at 09/07/15 2221  . mupirocin ointment (BACTROBAN) 2 % 1 application  1 application Nasal BID Thayer Headings, MD   1 application at [id number]  . nitroGLYCERIN 50 mg in dextrose 5 % 250 mL (0.2 mg/mL) infusion  2-200 mcg/min Intravenous Titrated Evelene Croon Barrett, PA-C 7.5 mL/hr at 09/08/15 0753 25 mcg/min at 09/08/15 0753  . omega-3 acid ethyl esters (LOVAZA) capsule 2 g  2 g Oral q morning - 10a Rhonda G Barrett, PA-C   2 g  at 09/07/15 2220  . ondansetron (ZOFRAN) injection 4 mg  4 mg Intravenous Q6H PRN Evelene Croon Barrett, PA-C   4 mg at 09/07/15 2241  . oxyCODONE (Oxy IR/ROXICODONE) immediate release tablet 5 mg  5 mg Oral Q6H PRN Evelene Croon Barrett, PA-C   5 mg at 09/08/15 1131  . pantoprazole (PROTONIX) EC tablet 40 mg  40 mg Oral QHS Rhonda G Barrett, PA-C   40 mg at 09/07/15 2223  . polyethylene glycol (MIRALAX / GLYCOLAX) packet 17 g  17 g Oral BID Rhonda G Barrett, PA-C   17 g at 09/08/15 1123  . potassium chloride SA (K-DUR,KLOR-CON) CR tablet 10 mEq  10 mEq Oral Daily Rhonda G Barrett, PA-C   10 mEq at 09/08/15 1121  . ranolazine (RANEXA) 12 hr tablet 1,000 mg  1,000 mg Oral BID Evelene Croon Barrett, PA-C   1,000 mg at 09/08/15 1122  . rivaroxaban (XARELTO) tablet 20 mg  20 mg Oral Q supper Evelene Croon Barrett, PA-C   20 mg at 09/07/15 2223  . sertraline (ZOLOFT) tablet 25 mg  25 mg Oral Daily Rhonda G Barrett, PA-C   25 mg at 09/08/15 1120  . sodium chloride (OCEAN) 0.65 % nasal spray 1 spray  1 spray Each Nare QID Evelene Croon Barrett, PA-C   1 spray at 09/08/15 1128  . sodium phosphate (FLEET) 7-19 GM/118ML enema 1 enema  1 enema Rectal BID PRN Evelene Croon Barrett, PA-C      . tiotropium (SPIRIVA) inhalation capsule 18 mcg  18 mcg Inhalation Daily Evelene Croon Barrett, PA-C   18 mcg at 09/08/15 [id number]  . zolpidem (AMBIEN) tablet 5 mg  5 mg Oral QHS PRN Thayer Headings, MD         Discharge Medications: Please see discharge summary for a list of discharge medications.  Relevant  Imaging Results:  Relevant Lab Results:   Additional Information SSN: [id number]. Palliative services to follow patient at SNF.   Rigoberto Noel, LCSW

## 2015-09-08 NOTE — Progress Notes (Addendum)
Palliative Medicine RN Note: Patient reports he is still having CP with NTG drip. Cardiology note states goal is to wean from NTG and d/c in the next few days. Patient is requesting medication for anxiety, as he becomes very anxious with CP. Patient and wife report enema was effective and that he had a BM yesterday. Left message for Dr Rowe Pavy with PMT to discuss medications. Larina Earthly, RN, BSN, [facility] 09/08/2015 11:59 AM Cell [phone] 8:00-4:00 Monday-Friday Office [phone]

## 2015-09-08 NOTE — Progress Notes (Signed)
When writing RN received report from the emergency department RN, per emergency department RN soap suds enema not given.  At this time, RN administered soap suds enema.

## 2015-09-08 NOTE — Consult Note (Signed)
WOC requested to address pin site care for the RLE, reviewed ortho consultation note at the time of transfer and Dr. Trevor Mace note recommends saline/hydrogen peroxide mixture to clean pin sites daily.  Updated orders for bedside nursing staff according.  Henry Karaffa Thayer RN,CWOCN [id number]

## 2015-09-08 NOTE — Progress Notes (Signed)
Initial Nutrition Assessment  DOCUMENTATION CODES:   Severe malnutrition in context of chronic illness  INTERVENTION:    Ensure Enlive po TID, each supplement provides 350 kcal and 20 grams of protein  NUTRITION DIAGNOSIS:   Malnutrition related to chronic illness as evidenced by severe depletion of muscle mass, percent weight loss (14% weight loss over the past 6 months).  GOAL:   Patient will meet greater than or equal to 90% of their needs  MONITOR:   PO intake, Supplement acceptance, Labs, Skin  REASON FOR ASSESSMENT:   Malnutrition Screening Tool    ASSESSMENT:   75 year old male admitted with severe angina at rest. Hx of recent hospitalization for stroke and CAD.   Patient reports that he has been eating poorly for the past few weeks (suspect more like months). He c/o constipation and abd pain from pain medication that he is taking. He has a ruptured Achilles tendon and has an external fixation device in place on his right lower extremity, managed by an orthopedist at [facility]. He has been consuming <25% of meals since admission. Likes Ensure supplements, encouraged him to continue drinking Ensure supplements between meals even after d/c to rehab facility. Nutrition-Focused physical exam completed. Findings are no fat depletion, mild-moderate and severe muscle depletion, and mild edema. Patient also with 14% weight loss over the past 6 months. Patient with severe PCM.  Diet Order:  Diet Heart Room service appropriate?: Yes; Fluid consistency:: Thin  Skin:  Wound (see comment) (wound left lower leg, incision to right leg)  Last BM:  2/24  Height:   Ht Readings from Last 1 Encounters:  09/07/15 5\' 8"  (1.727 m)    Weight:   Wt Readings from Last 1 Encounters:  09/08/15 166 lb 9.6 oz (75.569 kg)    Ideal Body Weight:  70 kg  BMI:  Body mass index is 25.34 kg/(m^2).  Estimated Nutritional Needs:   Kcal:  2000-2200  Protein:  100-120  gm  Fluid:  >/= 2 L  EDUCATION NEEDS:   Education needs addressed  Molli Barrows, [place], [place], [place] Pager [phone] After Hours Pager [phone]

## 2015-09-08 NOTE — Plan of Care (Signed)
Problem: Education: Goal: Knowledge of [place] [facility] Education information/materials will improve Outcome: Progressing Patient aware of plan of care.  Patient has had some complaints of pain during this shift. See pain assessment(s) on flow sheets for further information.  RN discussed with patient different pain medication options.  Pain medication(s) administered per orders.  Pain medication effective as evidenced by patient sleeping.  Patient educated not to attempt to get out of bed.  Patient stated understanding.  Fall risk measures in place.

## 2015-09-08 NOTE — Plan of Care (Signed)
Problem: Bowel/Gastric: Goal: Will not experience complications related to bowel motility Outcome: Progressing RN administered enema this shift per order.  Thus far this shift patient has had two bowel movements.

## 2015-09-08 NOTE — Progress Notes (Signed)
Utilization review completed. Christabell Loseke, RN, BSN. 

## 2015-09-08 NOTE — Clinical Social Work Note (Signed)
Clinical Social Work Assessment  Patient Details  Name: Henry [name] MRN: [id number] Date of Birth: 1941-07-04  Date of referral:  09/08/15               Reason for consult:  Discharge Planning, Facility Placement                Permission sought to share information with:  Facility Sport and exercise psychologist, Family Supports Permission granted to share information::  Yes, Verbal Permission Granted  Name::     Geologist, engineering::  SNF - [place]  Relationship::     Contact Information:     Housing/Transportation Living arrangements for the past 2 months:  [place] of Information:  Patient, Spouse Patient Interpreter Needed:  None Criminal Activity/Legal Involvement Pertinent to Current Situation/Hospitalization:  No - Comment as needed Significant Relationships:  Adult Children, Spouse Lives with:  Spouse Do you feel safe going back to the place where you live?  Yes Need for family participation in patient care:     Care giving concerns:  Patient and wife are concerned about the patient's continued chest pain.   Social Worker assessment / plan:  CSW met with patient and wife at bedside. Patient readmitted from [facility] due to continued chest pain. Patient and wife plan for discharge back to [facility] when ready. Facility states they will take the patient over the weekend if ready for DC. Report left for weekend CSW.  Employment status:  Retired Nurse, adult PT Recommendations:  [place] / Referral to community resources:  [place] ([place] prepared to take patient back.)  Patient/Family's Response to care:  Patient and wife are happy with the care the patient has received.  Patient/Family's Understanding of and Emotional Response to Diagnosis, Current Treatment, and Prognosis: The patient and wife appear to have a good understanding of the reason for the admission  and the post DC needs of the patient.  Emotional Assessment Appearance:  Appears stated age Attitude/Demeanor/Rapport:  Other Affect (typically observed):  Accepting, Calm, Appropriate, Pleasant Orientation:  Oriented to Self, Oriented to Place, Oriented to  Time, Oriented to Situation Alcohol / Substance use:  Not Applicable Psych involvement (Current and /or in the community):  No (Comment)  Discharge Needs  Concerns to be addressed:  Discharge Planning Concerns Readmission within the last 30 days:  Yes Current discharge risk:  Chronically ill, Physical Impairment Barriers to Discharge:  Continued Medical Work up   Rigoberto Noel, LCSW 09/08/2015, 4:53 PM

## 2015-09-08 NOTE — Progress Notes (Signed)
PROGRESS NOTE  Subjective:   75 y.o. 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 Other hx is COPD and Lupus  Was admitted with severe angina at rest .   Has known critical CAD with a 99+ % stenosis in the LCx that is not amenable to CPI.    Objective:    Vital Signs:   Temp:  [98.3 F (36.8 C)-98.8 F (37.1 C)] 98.3 F (36.8 C) (02/24 0410) Pulse Rate:  [89-106] 91 (02/24 0740) Resp:  [11-27] 22 (02/24 0740) BP: (119-186)/(77-107) 145/86 mmHg (02/24 0740) SpO2:  [93 %-99 %] 96 % (02/24 0905) Weight:  [166 lb 9.6 oz (75.569 kg)-169 lb (76.658 kg)] 166 lb 9.6 oz (75.569 kg) (02/24 0524)  Last BM Date: 09/08/15   24-hour weight change: Weight change:   Weight trends: Filed Weights   09/07/15 0730 09/07/15 2037 09/08/15 0524  Weight: 174 lb (78.926 kg) 169 lb (76.658 kg) 166 lb 9.6 oz (75.569 kg)    Intake/Output:  02/23 0701 - 02/24 0700 In: 260 [P.O.:260] Out: 750 [Urine:750] Total I/O In: 240 [P.O.:240] Out: 200 [Urine:200]   Physical Exam: BP 145/86 mmHg  Pulse 91  Temp(Src) 98.3 F (36.8 C) (Oral)  Resp 22  Ht 5\' 8"  (1.727 m)  Wt 166 lb 9.6 oz (75.569 kg)  BMI 25.34 kg/m2  SpO2 96%  Wt Readings from Last 3 Encounters:  09/08/15 166 lb 9.6 oz (75.569 kg)  09/06/15 174 lb 3.2 oz (79.017 kg)  08/27/15 170 lb 1.6 oz (77.157 kg)    General: Vital signs reviewed and noted.   Head: Normocephalic, atraumatic.  Eyes: conjunctivae/corneas clear.  EOM's intact.   Throat: normal  Neck:  normal   Lungs:    clear   Heart:  RR   Abdomen:  Soft, non-tender, non-distended    Extremities: Normal , no c/c/e  . Right leg in external fixation   Neurologic: A&O X3, CN II - XII are grossly intact.   Psych: Normal     Labs: BMET:  Recent Labs  09/06/15 0539 09/07/15 0801  NA 137 136  K 4.3 4.2  CL 99* 101  CO2 25 24  GLUCOSE 105* 142*  BUN 11  11  CREATININE 1.21 1.02  CALCIUM 9.2 9.0    Liver function tests: No results for input(s): AST, ALT, ALKPHOS, BILITOT, PROT, ALBUMIN in the last 72 hours. No results for input(s): LIPASE, AMYLASE in the last 72 hours.  CBC:  Recent Labs  09/06/15 0539 09/07/15 0801  WBC 10.4 11.6*  HGB 9.2* 10.4*  HCT 29.8* 32.9*  MCV 85.6 84.1  PLT 469* 543*    Cardiac Enzymes:  Recent Labs  09/07/15 1323 09/07/15 2039 09/08/15 0021  TROPONINI 0.05* 0.06* 0.06*    Coagulation Studies:  Recent Labs  09/05/15 1336  LABPROT 16.0*  INR 1.27    Other: Invalid input(s): POCBNP No results for input(s): DDIMER in the last 72 hours. No results for input(s): HGBA1C in the last 72 hours. No results for input(s): CHOL, HDL, LDLCALC, TRIG, CHOLHDL in the last 72 hours. No results for input(s): TSH, T4TOTAL, T3FREE, THYROIDAB in the last 72 hours.  Invalid input(s): FREET3 No results for input(s): VITAMINB12, FOLATE, FERRITIN, TIBC, IRON, RETICCTPCT in the last 72 hours.   Other results:  Tele   ( personally reviewed )  NSR   Medications:    Infusions: . nitroGLYCERIN 25 mcg/min (09/08/15 0753)    Scheduled Medications: . aspirin EC  81 mg Oral Daily  . atorvastatin  80 mg Oral Daily  . budesonide-formoterol  2 puff Inhalation BID  . carvedilol  25 mg Oral BID WC  . Chlorhexidine Gluconate Cloth  6 each Topical Q0600  . clopidogrel  75 mg Oral Daily  . docusate sodium  200 mg Oral BID  . feeding supplement (ENSURE ENLIVE)  237 mL Oral BID BM  . fluticasone  1 spray Each Nare Daily  . furosemide  20 mg Oral Daily  . insulin aspart  0-15 Units Subcutaneous TID WC  . insulin aspart  0-5 Units Subcutaneous QHS  . lactulose  15 g Oral Daily  . linagliptin  5 mg Oral Daily  . losartan  50 mg Oral Daily  . montelukast  10 mg Oral QHS  . mupirocin ointment  1 application Nasal BID  . omega-3 acid ethyl esters  2 g Oral q morning - 10a  . pantoprazole  40 mg Oral QHS  .  polyethylene glycol  17 g Oral BID  . potassium chloride  10 mEq Oral Daily  . ranolazine  1,000 mg Oral BID  . rivaroxaban  20 mg Oral Q supper  . sertraline  25 mg Oral Daily  . sodium chloride  1 spray Each Nare QID  . tiotropium  18 mcg Inhalation Daily    Assessment/ Plan:   Principal Problem:   Angina pectoris ([place]) Active Problems:   Essential hypertension, benign   Long term current use of anticoagulant therapy   Type 2 diabetes mellitus (HCC)   CKD (chronic kidney disease), stage II   Chronic diastolic CHF (congestive heart failure) (HCC)   Stroke (cerebrum) (HCC)   Angina at rest [facility])   Unstable angina ([place])  1. CAD - has severe LCx stenosis that is not amenable to PCI.  He is not a candidate for redo CABG - too high risk   Will continue to titrate up the coreg.  Will wean the NTG drip to off today or tomorrow and anticipate DC either Saturday or Sunday  Palliative care has seen   Disposition: possible DC tomorrow or Sunday  if he is stable  Will need to titrate the NTG drip to off  Length of Stay: 1  Thayer Headings, Brooke Bonito., MD, [facility] 09/08/2015, 9:29 AM Office [phone] Pager [phone]

## 2015-09-08 NOTE — H&P (Signed)
CARDIOLOGY HISTORY AND PHYSICAL   Patient ID: Henry [name] MRN: [id number] DOB/AGE: [birth year] 74 y.o.  Admit date: 09/07/2015  Primary Physician Sherrie Mustache, MD Primary Cardiologist Dr Domenic Polite Reason for Consultation Chest pain  [id number] Henry [name] is a 75 y.o. 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 Other hx is COPD and Lupus.   He has a ruptured Achilles tendon and has an external fixation device in place on his right lower extremity, managed by an orthopedist at [facility].  Admit 02/12-02/14 for chest pain, med rx recommended.  Admit 02/15-02/22 after admit for CVA, Cards saw for elevated troponin and chest pain, s/p cath 02/21, results below, med rx recommended. Cath showed severe calcified LCx stenosis, a very complex lesion. The RCA is occluded and fillsfrom left to right collaterals. LIMA-LAD is his only patent graft.  Henry [name] admits that his chest pain level has not been 0 for well over a week. He states he constantly has chest pressure at a 2-3/10. He went to the nursing home yesterday for rehabilitation. He has been constipated, he believes from narcotics for his leg pain. Today, he was straining at bowel, and his chest pain increased to a 10/10. He was given sublingual nitroglycerin and a total of 4 baby aspirins between facility staff and EMS. His pain improved. He is currently having chest pain at a 6 or 7/10. He also describes some nausea and lower abdominal discomfort from constipation. He had some shortness of breath when the pain was severe but feels okay now.   Past Medical History  Diagnosis Date  . Coronary atherosclerosis of native coronary artery     a. NSTEMI 2005 s/p CABG (LIMA - LAD, SVG - OM, SVG - PDA). b. DES to Cx 02/2005. c. NSTEMI 07/2010 due to occ Cx - med rx. Known occluded vein grafts.  .  Essential hypertension, benign   . Type 2 diabetes mellitus ([place])   . Dyslipidemia   . ACE inhibitor intolerance   . Asthma   . Recurrent pulmonary embolism ([place])     a. 01/2011. b. Bilateral PE 11/2011 - + lupus anticoagulant preliminary testing. Felt to require lifelong Coumadin.  . Pulmonary nodule     RML by CT 11/2011, stable compared to 2012.  Marland Kitchen Cholelithiasis   . Pulmonary HTN ([place])     Echo 11/2011 at time of PE. PA pressure not assessed on 03/2012 echo but RV was back to normal.  . Lupus anticoagulant positive   . GERD (gastroesophageal reflux disease)   . COPD (chronic obstructive pulmonary disease) ([place])   . OSA (obstructive sleep apnea)      Past Surgical History  Procedure Laterality Date  . Coronary artery bypass graft  2005     LIMA to LAD, SVG to OM, SVG to RCA  . L4-l5 laminectomy    . Colonoscopy    . Esophagogastroduodenoscopy N/A 12/19/2013    Procedure: ESOPHAGOGASTRODUODENOSCOPY (EGD); Surgeon: Lafayette Dragon, MD; Location: [facility] ENDOSCOPY; Service: Endoscopy; Laterality: N/A;  . Kidney stone removed    . Cardiac catheterization N/A 09/05/2015    Procedure: Left Heart Cath and Cors/Grafts Angiography; Surgeon: Leonie Man, MD; Location: [facility] CV LAB; Service: Cardiovascular; Laterality: N/A;    Allergies  Allergen Reactions  . Ace Inhibitors Cough    Cough  . Gabapentin Other (See  Comments)    WAS BEHAVING VERY ODDLY    I have reviewed the patient's current medications Medication Sig  acetaminophen (TYLENOL) 325 MG tablet Take 650 mg by mouth every 4 (four) hours as needed for mild pain. For pain  albuterol (PROAIR HFA) 108 (90 BASE) MCG/ACT inhaler Inhale 2 puffs into the lungs every 6 (six) hours as needed for wheezing or shortness of breath.   atorvastatin (LIPITOR) 80 MG tablet Take 1 tablet (80 mg total) by mouth daily.   budesonide-formoterol (SYMBICORT) 160-4.5 MCG/ACT inhaler Inhale 2 puffs into the lungs 2 (two) times daily.  clopidogrel (PLAVIX) 75 MG tablet Take 1 tablet (75 mg total) by mouth daily.  Coenzyme Q10 (CO Q 10) 100 MG CAPS Take 100 mg by mouth daily.  docusate sodium (COLACE) 100 MG capsule Take 100 mg by mouth 2 (two) times daily.  fish oil-omega-3 fatty acids 1000 MG capsule Take 2 g by mouth every morning.   fluticasone (FLONASE) 50 MCG/ACT nasal spray Place 1 spray into both nostrils daily.   furosemide (LASIX) 20 MG tablet Take 20 mg by mouth daily.  HYDROcodone-acetaminophen (NORCO) 7.5-325 MG tablet Take 1 tablet by mouth every 6 (six) hours as needed for moderate pain.  isosorbide mononitrate (IMDUR) 120 MG 24 hr tablet Take 120 mg by mouth daily.  lactulose (CHRONULAC) 10 GM/15ML solution Take 15 g by mouth daily.   losartan (COZAAR) 100 MG tablet Take 0.5 tablets (50 mg total) by mouth daily.  magnesium hydroxide (MILK OF MAGNESIA) 400 MG/5ML suspension Take 15 mLs by mouth daily as needed for mild constipation.  metoprolol (LOPRESSOR) 50 MG tablet TAKE 1 AND 1/2 TABLETS BY MOUTH TWICE DAILY  montelukast (SINGULAIR) 10 MG tablet Take 10 mg by mouth at bedtime.  nitroGLYCERIN (NITROSTAT) 0.4 MG SL tablet Place 1 tablet (0.4 mg total) under the tongue every 5 (five) minutes x 3 doses as needed. For chest pain  omeprazole (PRILOSEC) 40 MG capsule Take 40 mg by mouth every evening.  oxyCODONE (OXY IR/ROXICODONE) 5 MG immediate release tablet Take 5 mg by mouth every 6 (six) hours as needed for moderate pain or severe pain.  polyethylene glycol (MIRALAX / GLYCOLAX) packet Take 17 g by mouth daily.  potassium chloride (K-DUR) 10 MEQ tablet Take 10 mEq by mouth every morning.  potassium chloride (K-DUR,KLOR-CON) 10 MEQ tablet Take 10 mEq by mouth daily.  ranolazine (RANEXA) 1000 MG SR tablet Take 1 tablet (1,000 mg total) by mouth 2 (two) times  daily.  rivaroxaban (XARELTO) 20 MG TABS tablet Take 20 mg by mouth daily.  sertraline (ZOLOFT) 25 MG tablet Take 1 tablet (25 mg total) by mouth daily.  sitaGLIPtin (JANUVIA) 25 MG tablet Take 1 tablet (25 mg total) by mouth daily.  sodium chloride (OCEAN) 0.65 % SOLN nasal spray Place 1 spray into both nostrils 4 (four) times daily.  tiotropium (SPIRIVA) 18 MCG inhalation capsule Place 1 capsule (18 mcg total) into inhaler and inhale daily.  zinc oxide 20 % ointment Apply topically every Monday.  zolpidem (AMBIEN) 10 MG tablet Take 1 tablet (10 mg total) by mouth at bedtime as needed for sleep.    Social History   Social History  . Marital Status: Married    Spouse Name: N/A  . Number of Children: N/A  . Years of Education: N/A   Occupational History  . Retired    Social History Main Topics  . Smoking status: Former Smoker -- 0.30 packs/day for 30 years  Types: Cigarettes    Start date: 09/28/1945    Quit date: 07/15/1972  . Smokeless tobacco: Current User    Types: Chew     Comment: chews 1/2 pack tobacco per day  . Alcohol Use: No  . Drug Use: No  . Sexual Activity: Not on file   Other Topics Concern  . Not on file   Social History Narrative   Married, retired.     Family Status  Relation Status Death Age  . Mother Deceased   . Father Deceased    Family History  Problem Relation Age of Onset  . Heart disease Father   . Heart disease Sister   . Breast cancer Sister   . Heart disease Brother      ROS: Full 14 point review of systems complete and found to be negative unless listed above.  Physical Exam: Blood pressure 162/96, pulse 94, temperature 97.5 F (36.4 C), temperature source Oral, resp. rate 17, height 5\' 8"  (1.727 m), weight 174 lb (78.926 kg), SpO2 94 %.  General: Well developed, well nourished, male in no acute  distress Head: Eyes PERRLA, No xanthomas. Normocephalic and atraumatic, oropharynx without edema or exudate. Dentition:  Lungs:  Heart: HRRR S1 S2, no rub/gallop, Heart irregular rate and rhythm with S1, S2 murmur. pulses are 2+ extrem.  Neck: No carotid bruits. No lymphadenopathy. JVD. Abdomen: Bowel sounds present, abdomen soft and non-tender without masses or hernias noted. Msk: No spine or cva tenderness. No weakness, no joint deformities or effusions. Extremities: No clubbing or cyanosis. edema.  Neuro: Alert and oriented X 3. No focal deficits noted. Psych: Good affect, responds appropriately Skin: No rashes or lesions noted.  Labs:   Recent Labs    Lab Results  Component Value Date   WBC 11.6* 09/07/2015   HGB 10.4* 09/07/2015   HCT 32.9* 09/07/2015   MCV 84.1 09/07/2015   PLT 543* 09/07/2015      Recent Labs (last 2 labs)      Recent Labs  09/05/15 1336  INR 1.27       Last Labs      Recent Labs Lab 09/07/15 0801  NA 136  K 4.2  CL 101  CO2 24  BUN 11  CREATININE 1.02  CALCIUM 9.0  GLUCOSE 142*      Recent Labs (last 2 labs)      Recent Labs  09/07/15 0800  TROPIPOC 0.05      Recent Labs    Lab Results  Component Value Date   CHOL 93 08/31/2015   HDL 31* 08/31/2015   LDLCALC 45 08/31/2015   TRIG 87 08/31/2015     Echo: 08/29/2015 - Left ventricle: The cavity size was normal. Wall thickness was increased in a pattern of mild LVH. Systolic function was normal. The estimated ejection fraction was in the range of 60% to 65%. Wall motion was normal; there were no regional wall motion abnormalities. Features are consistent with a pseudonormal left ventricular filling pattern, with concomitant abnormal relaxation and increased filling pressure (grade 2 diastolic dysfunction). Doppler parameters are consistent with high ventricular  filling pressure. - Aortic valve: Moderately to severely calcified annulus. Trileaflet; moderately thickened leaflets. Valve area (VTI): 2.1 cm^2. Valve area (Vmax): 2.07 cm^2. - Mitral valve: Moderately calcified annulus. Mildly thickened leaflets . There was mild regurgitation. - Left atrium: The atrium was moderately dilated. - Right atrium: The atrium was mildly dilated. - Technically adequate study.  ECG: 09/07/2015  CATH: 09/05/2015 1. Colon Flattery  LM to LM lesion, 65% stenosed. 2. Ost Cx lesion, 95% stenosed. 3. Ost Cx to Prox Cx lesion, 65% stenosed. The lesion was previously treated with a stent (unknown type) greater than two years ago. 4. Lat 1st Mrg lesion, 100% stenosed - with known occluded SVG. Ost 1st Mrg to 1st Mrg lesion, 85% stenosed. 5. Ost LAD to Mid LAD lesion, 65% stenosed. Mid LAD lesion, 100% stenosed. - Chronic 6. Dist LAD lesion, 75% stenosed. - Chronic 7. Prox RCA to Mid RCA lesion, 100% stenosed. Fills via Cx-RPL Collaterals Mid RCA to Dist RCA lesion, 55% stenosed. 8. LIMA-LAD was injected is normal in caliber, and is anatomically normal.  Radiology: Dg Chest 2 View 09/07/2015 CLINICAL DATA: Chest pain and shortness of breath since last evening. EXAM: CHEST 2 VIEW COMPARISON: 08/31/2015 FINDINGS: The heart is mildly enlarged but stable. There is mild tortuosity and calcification of the thoracic aorta. Stable surgical changes from bypass surgery. There is mild vascular congestion without overt pulmonary edema. There are small bilateral pleural effusions without definite infiltrates. Stable eventration of the right hemidiaphragm. IMPRESSION: Mild cardiac enlargement and moderate vascular congestion without overt pulmonary edema. Small bilateral pleural effusions with overlying atelectasis. Electronically Signed By: Marijo Sanes M.D. On: 09/07/2015 08:18    ASSESSMENT AND PLAN: The patient was seen today by Dr. Acie Fredrickson, the patient evaluated and the  data reviewed.  Principal Problem: 1. Angina pectoris ([place]) -He has had chest pain continuously for over a week. - Known severe CAD with an initial trial of medical therapy recommended. - He is on high doses of Imdur and Ranexa, but they're not successfully controlling his pain. - He is on Plavix without aspirin because of the need for systemic anticoagulation. - Admit, pain control with IV nitroglycerin, hold Imdur - M.D. to review Films again with interventional list to see if there is any possibility of PCI, with his multiple comorbidities, feel redo bypass surgery is not the best option but we may need TCTS to see.  Active Problems: 2. Essential hypertension, benign - BP is high now, but he has not had his morning medications. - Restart  3. Long term current use of anticoagulant therapy - On Plavix and Xarelto prior to admission. - Old Xarelto until the determination is made whether or not cardiac catheterization will be performed  4. Type 2 diabetes mellitus (HCC) - Add sliding scale insulin. - Continue Januvia  5. CKD (chronic kidney disease), stage II - Follow  6. Chronic diastolic CHF (congestive heart failure) (HCC) - Continue Lasix and daily weights. He has some vascular congestion, but his neck veins are only minimally elevated. We will check a BNP  7. Stroke (cerebrum) [facility]) - Reason for admission 08/28/2014  8. Constipation - Increased Miralax to twice a day - Add stool softeners - May need daily milk of magnesia also - Likely secondary to narcotic use as the pain in his leg is significant area   9. Achilles tendon rupture - The M.D. at [facility] was going to discuss removing the external fixation device this week. - Continue it for now, follow-up as soon as possible   Signed: Lenoard Aden 09/07/2015 11:41 AM Beeper [id number]  Co-Sign MD   Attending Note:   The patient was seen and examined. Agree with assessment and plan as noted  above. Changes made to the above note as needed.  I have reviewed the angiograms with Dr. Angelena Form, [place] and now Dr. Irish Lack. All agree that there are no options for  PCI. Furthermore, He has collateral filling of the RCA from the LCx so attempting PCI of this stenosis would potentially jeopardize his LCx and RCA. This would be a life threatening complication .  At this point we do not have any procedure to offer  We will try to maximize his medical therapy - will be very aggressive with our beta blocer therapy   Thayer Headings, Brooke Bonito., MD, [facility] 09/07/2015, 12:56 PM [address], [place] Pager [phone]

## 2015-09-08 NOTE — Consult Note (Signed)
Consultation Note Date: 09/08/2015   Patient Name: Henry [name]  DOB: 1941/06/10  MRN: [id number]  Age / Sex: 75 y.o., male  PCP: Dione Housekeeper, MD Referring Physician: Thayer Headings, MD  Reason for Consultation: Establishing goals of care    Clinical Assessment/Narrative:  75 y.o. 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 Other hx is COPD and Lupus  He was admitted with severe angina at rest .   Has known critical CAD with a 99+ % stenosis in the LCx that is not amenable to [place], patient has diabetes, stage II chronic kidney disease, history of stroke. He remains on nitroglycerin drip. He is deemed not a candidate for redo CABG. Palliative care consultation for goals of care discussions.  Patient is alert awake resting in bed. His wife is present at the bedside. CODE STATUS discussions undertaken as represented below. Addressed management of symptoms. Patient wishes to state of the hospital long enough to go back to his orthopedic doctors in [facility] for his ruptured Achilles tendon treatment and right lower extremity. He was hospitalized recently for TIA/CVA. No hospitalizations for chest pain.  Contacts/Participants in Discussion: Primary Decision Maker:     Relationship to Patient   HCPOA: yes   wife   SUMMARY OF RECOMMENDATIONS: Patient elects continuation of full CODE STATUS. He states that him and his wife have had several discussions with regards to this. He would like to undergo CPR and be placed on mechanical ventilation for a total of 72 hours. He states he is instructed his wife to take him off of ventilator/life support if he does not improve after 3 days of being on aggressive measures. He does not wish to discuss this further. Symptom management: expanded bowel regimen. Will add  Ativan for anxiety. Continue with opioids for pain relief.  Code Status/Advance Care Planning: Full code    Code Status Orders        Start     Ordered   09/07/15 1952  Full code   Continuous     09/07/15 1951    Code Status History    Date Active Date Inactive Code Status Order ID Comments User Context   08/31/2015  1:03 AM 09/06/2015  9:23 PM Full Code [id number]  Vianne Bulls, MD Inpatient   08/27/2015 10:13 PM 08/30/2015  9:25 PM Full Code [id number]  Kelvin Cellar, MD Inpatient   02/05/2015  7:55 PM 02/09/2015  6:22 PM Full Code [id number]  Caren Griffins, MD ED   12/18/2013  1:45 AM 12/20/2013  4:51 PM Full Code [id number]  Merton Border, MD Inpatient   12/08/2011 12:06 PM 12/14/2011  7:32 PM Full Code [id number]  Ezra Sites, MD ED      Other Directives:Advanced Directive  Symptom Management:    Symptom management of constipation. Adjusted bowel regimen.  Anxiety: Ativan by mouth when necessary  Chest pain/dyspnea continue with current opioids, added morphine IV when necessary  Palliative Prophylaxis:   Delirium Protocol     Psycho-social/Spiritual:  Support System: [place] Desire for further Chaplaincy support:no Additional Recommendations: Caregiving  Support/Resources  Prognosis: ? Few months  Discharge Planning: Home with Home Health   Chief Complaint/ Primary Diagnoses: Present on Admission:  . Angina pectoris ([place]) . Stroke (cerebrum) ([place]) . CKD (chronic kidney disease), stage II . Chronic diastolic CHF (congestive heart failure) (  [place]) . Essential hypertension, benign . Angina at rest [facility])  I have reviewed the medical record, interviewed the patient and family, and examined the patient. The following aspects are pertinent.  Past Medical History  Diagnosis Date  . Coronary atherosclerosis of native coronary artery     a. NSTEMI 2005 s/p CABG (LIMA - LAD, SVG - OM, SVG - PDA). b. DES to Cx 02/2005. c. NSTEMI 07/2010 due to occ Cx - med rx. Known occluded vein  grafts.  . Essential hypertension, benign   . Type 2 diabetes mellitus ([place])   . Dyslipidemia   . ACE inhibitor intolerance   . Asthma   . Recurrent pulmonary embolism ([place])     a. 01/2011. b. Bilateral PE 11/2011 - + lupus anticoagulant preliminary testing. Felt to require lifelong Coumadin.  . Pulmonary nodule     RML by CT 11/2011, stable compared to 2012.  Marland Kitchen Cholelithiasis   . Pulmonary HTN ([place])     Echo 11/2011 at time of PE. PA pressure not assessed on 03/2012 echo but RV was back to normal.  . Lupus anticoagulant positive   . GERD (gastroesophageal reflux disease)   . COPD (chronic obstructive pulmonary disease) ([place])   . OSA (obstructive sleep apnea)    Social History   Social History  . Marital Status: Married    Spouse Name: N/A  . Number of Children: N/A  . Years of Education: N/A   Occupational History  . Retired    Social History Main Topics  . Smoking status: Former Smoker -- 0.30 packs/day for 30 years    Types: Cigarettes    Start date: 09/28/1945    Quit date: 07/15/1972  . Smokeless tobacco: Current User    Types: Chew     Comment: chews 1/2 pack tobacco per day  . Alcohol Use: No  . Drug Use: No  . Sexual Activity: Not Asked   Other Topics Concern  . None   Social History Narrative   Married, retired.    Family History  Problem Relation Age of Onset  . Heart disease Father   . Heart disease Sister   . Breast cancer Sister   . Heart disease Brother    Scheduled Meds: . aspirin EC  81 mg Oral Daily  . atorvastatin  80 mg Oral Daily  . budesonide-formoterol  2 puff Inhalation BID  . carvedilol  37.5 mg Oral BID WC  . Chlorhexidine Gluconate Cloth  6 each Topical Q0600  . clopidogrel  75 mg Oral Daily  . docusate sodium  200 mg Oral BID  . feeding supplement (ENSURE ENLIVE)  237 mL Oral TID BM  . fluticasone  1 spray Each Nare Daily  . furosemide  20 mg Oral Daily  . hydrogen peroxide 1/2 strength irrigation   Irrigation q morning - 10a  .  insulin aspart  0-15 Units Subcutaneous TID WC  . insulin aspart  0-5 Units Subcutaneous QHS  . lactulose  15 g Oral Daily  . linagliptin  5 mg Oral Daily  . losartan  50 mg Oral Daily  . montelukast  10 mg Oral QHS  . mupirocin ointment  1 application Nasal BID  . omega-3 acid ethyl esters  2 g Oral q morning - 10a  . pantoprazole  40 mg Oral QHS  . polyethylene glycol  17 g Oral BID  . potassium chloride  10 mEq Oral Daily  . ranolazine  1,000 mg Oral BID  . rivaroxaban  20 mg Oral Q supper  . sertraline  25 mg Oral Daily  . sodium chloride  1 spray Each Nare QID  . tiotropium  18 mcg Inhalation Daily   Continuous Infusions: . nitroGLYCERIN 20 mcg/min (09/08/15 1625)   PRN Meds:.acetaminophen, albuterol, HYDROcodone-acetaminophen, LORazepam, magnesium hydroxide, morphine injection, ondansetron (ZOFRAN) IV, oxyCODONE, sodium phosphate, zolpidem Medications Prior to Admission:  Prior to Admission medications   Medication Sig Start Date End Date Taking? Authorizing Provider  acetaminophen (TYLENOL) 325 MG tablet Take 650 mg by mouth every 4 (four) hours as needed for mild pain. For pain   Yes Historical Provider, MD  albuterol (PROAIR HFA) 108 (90 BASE) MCG/ACT inhaler Inhale 2 puffs into the lungs every 6 (six) hours as needed for wheezing or shortness of breath.  02/11/14 02/11/16 Yes Historical Provider, MD  atorvastatin (LIPITOR) 80 MG tablet Take 1 tablet (80 mg total) by mouth daily. 02/09/15  Yes Geradine Girt, DO  budesonide-formoterol (SYMBICORT) 160-4.5 MCG/ACT inhaler Inhale 2 puffs into the lungs 2 (two) times daily.   Yes Historical Provider, MD  clopidogrel (PLAVIX) 75 MG tablet Take 1 tablet (75 mg total) by mouth daily. 09/06/15  Yes Oswald Hillock, MD  Coenzyme Q10 (CO Q 10) 100 MG CAPS Take 100 mg by mouth daily.   Yes Historical Provider, MD  docusate sodium (COLACE) 100 MG capsule Take 100 mg by mouth 2 (two) times daily.   Yes Historical Provider, MD  fish oil-omega-3  fatty acids 1000 MG capsule Take 2 g by mouth every morning.    Yes Historical Provider, MD  fluticasone (FLONASE) 50 MCG/ACT nasal spray Place 1 spray into both nostrils daily.    Yes Historical Provider, MD  furosemide (LASIX) 20 MG tablet Take 20 mg by mouth daily.   Yes Historical Provider, MD  HYDROcodone-acetaminophen (NORCO) 7.5-325 MG tablet Take 1 tablet by mouth every 6 (six) hours as needed for moderate pain. 09/06/15  Yes Oswald Hillock, MD  isosorbide mononitrate (IMDUR) 120 MG 24 hr tablet Take 120 mg by mouth daily.   Yes Historical Provider, MD  lactulose (CHRONULAC) 10 GM/15ML solution Take 15 g by mouth daily.    Yes Historical Provider, MD  losartan (COZAAR) 100 MG tablet Take 0.5 tablets (50 mg total) by mouth daily. 09/06/15  Yes Oswald Hillock, MD  magnesium hydroxide (MILK OF MAGNESIA) 400 MG/5ML suspension Take 15 mLs by mouth daily as needed for mild constipation.   Yes Historical Provider, MD  metoprolol (LOPRESSOR) 50 MG tablet TAKE 1 AND 1/2 TABLETS BY MOUTH TWICE DAILY 05/22/15  Yes Satira Sark, MD  montelukast (SINGULAIR) 10 MG tablet Take 10 mg by mouth at bedtime.   Yes Historical Provider, MD  nitroGLYCERIN (NITROSTAT) 0.4 MG SL tablet Place 1 tablet (0.4 mg total) under the tongue every 5 (five) minutes x 3 doses as needed. For chest pain 04/05/14  Yes Satira Sark, MD  omeprazole (PRILOSEC) 40 MG capsule Take 40 mg by mouth every evening.   Yes Historical Provider, MD  oxyCODONE (OXY IR/ROXICODONE) 5 MG immediate release tablet Take 5 mg by mouth every 6 (six) hours as needed for moderate pain or severe pain.   Yes Historical Provider, MD  polyethylene glycol (MIRALAX / GLYCOLAX) packet Take 17 g by mouth daily.   Yes Historical Provider, MD  potassium chloride (K-DUR) 10 MEQ tablet Take 10 mEq by mouth every morning.   Yes Historical Provider, MD  potassium chloride (K-DUR,KLOR-CON) 10 MEQ tablet  Take 10 mEq by mouth daily.   Yes Historical Provider, MD    ranolazine (RANEXA) 1000 MG SR tablet Take 1 tablet (1,000 mg total) by mouth 2 (two) times daily. 08/30/15  Yes Kelvin Cellar, MD  rivaroxaban (XARELTO) 20 MG TABS tablet Take 20 mg by mouth daily.   Yes Historical Provider, MD  sertraline (ZOLOFT) 25 MG tablet Take 1 tablet (25 mg total) by mouth daily. 08/30/15  Yes Kelvin Cellar, MD  sitaGLIPtin (JANUVIA) 25 MG tablet Take 1 tablet (25 mg total) by mouth daily. 02/09/15  Yes Geradine Girt, DO  sodium chloride (OCEAN) 0.65 % SOLN nasal spray Place 1 spray into both nostrils 4 (four) times daily.   Yes Historical Provider, MD  tiotropium (SPIRIVA) 18 MCG inhalation capsule Place 1 capsule (18 mcg total) into inhaler and inhale daily. 11/16/14  Yes Juanito Doom, MD  zinc oxide 20 % ointment Apply topically every Monday. 09/06/15  Yes Oswald Hillock, MD  zolpidem (AMBIEN) 10 MG tablet Take 1 tablet (10 mg total) by mouth at bedtime as needed for sleep. 09/06/15  Yes Oswald Hillock, MD   Allergies  Allergen Reactions  . Ace Inhibitors Cough    Cough  . Gabapentin Other (See Comments)    WAS BEHAVING VERY ODDLY    Review of Systems Positive  for chest pain anxiety and pain in the leg  Physical Exam Elderly gentleman with a cast on his right lower family S1-S2 Clear Abdomen soft Awake alert Appears mildly anxious No edema  Vital Signs: BP 145/60 mmHg  Pulse 91  Temp(Src) 98.7 F (37.1 C) (Oral)  Resp 22  Ht 5\' 8"  (1.727 m)  Wt 75.569 kg (166 lb 9.6 oz)  BMI 25.34 kg/m2  SpO2 96%  SpO2: SpO2: 96 % O2 Device:SpO2: 96 % O2 Flow Rate: .O2 Flow Rate (L/min): 2 L/min  IO: Intake/output summary:  Intake/Output Summary (Last 24 hours) at 09/08/15 1805 Last data filed at 09/08/15 1732  Gross per 24 hour  Intake    500 ml  Output   1070 ml  Net   -570 ml    LBM: Last BM Date: 09/08/15 Baseline Weight: Weight: 78.926 kg (174 lb) Most recent weight: Weight: 75.569 kg (166 lb 9.6 oz)      Palliative Assessment/Data:   Flowsheet Rows        Most Recent Value   Intake Tab    Referral Department  Cardiology   Unit at Time of Referral  Med/Surg Unit   Palliative Care Primary Diagnosis  Cardiac   Palliative Care Type  New Palliative care   Reason for referral  Clarify Goals of Care, Non-pain Symptom   Date first seen by Palliative Care  09/07/15   Clinical Assessment    Palliative Performance Scale Score  40%   Pain Max last 24 hours  6   Pain Min Last 24 hours  5   Dyspnea Max Last 24 Hours  4   Dyspnea Min Last 24 hours  3   Psychosocial & Spiritual Assessment    Palliative Care Outcomes    Patient/Family meeting held?  Yes   Who was at the meeting?  Patient and wife   Palliative Care Outcomes  Clarified goals of care   Palliative Care follow-up planned  Yes, Facility      Additional Data Reviewed:  CBC:    Component Value Date/Time   WBC 11.6* 09/07/2015 0801   HGB 10.4* 09/07/2015 0801  HCT 32.9* 09/07/2015 0801   PLT 543* 09/07/2015 0801   MCV 84.1 09/07/2015 0801   NEUTROABS 10.9* 08/30/2015 2010   LYMPHSABS 5.0* 08/30/2015 2010   MONOABS 1.4* 08/30/2015 2010   EOSABS 0.3 08/30/2015 2010   BASOSABS 0.1 08/30/2015 2010   Comprehensive Metabolic Panel:    Component Value Date/Time   NA 136 09/07/2015 0801   K 4.2 09/07/2015 0801   CL 101 09/07/2015 0801   CO2 24 09/07/2015 0801   BUN 11 09/07/2015 0801   CREATININE 1.02 09/07/2015 0801   GLUCOSE 142* 09/07/2015 0801   CALCIUM 9.0 09/07/2015 0801   AST 25 08/30/2015 2010   ALT 15* 08/30/2015 2010   ALKPHOS 57 08/30/2015 2010   BILITOT 0.4 08/30/2015 2010   PROT 7.1 08/30/2015 2010   ALBUMIN 2.5* 08/30/2015 2010     Time In: 2 Time Out: 3 Time Total: 60 Greater than 50%  of this time was spent counseling and coordinating care related to the above assessment and plan.  Signed by: Loistine Chance, MD [phone] Loistine Chance, MD  09/08/2015, 6:05 PM  Please contact Palliative Medicine Team phone at [phone] for  questions and concerns.

## 2015-09-09 DIAGNOSIS — Z7901 Long term (current) use of anticoagulants: Secondary | ICD-10-CM

## 2015-09-09 DIAGNOSIS — E785 Hyperlipidemia, unspecified: Secondary | ICD-10-CM

## 2015-09-09 DIAGNOSIS — I208 Other forms of angina pectoris: Secondary | ICD-10-CM

## 2015-09-09 DIAGNOSIS — N182 Chronic kidney disease, stage 2 (mild): Secondary | ICD-10-CM

## 2015-09-09 DIAGNOSIS — I2782 Chronic pulmonary embolism: Secondary | ICD-10-CM

## 2015-09-09 LAB — GLUCOSE, CAPILLARY
GLUCOSE-CAPILLARY: 171 mg/dL — AB (ref 65–99)
GLUCOSE-CAPILLARY: 180 mg/dL — AB (ref 65–99)
GLUCOSE-CAPILLARY: 147 mg/dL — AB (ref 65–99)
GLUCOSE-CAPILLARY: 166 mg/dL — AB (ref 65–99)

## 2015-09-09 MED ORDER — ISOSORBIDE MONONITRATE ER 30 MG PO TB24
30.0000 mg | ORAL_TABLET | Freq: Every day | ORAL | Status: DC
  Administered 2015-09-09: 30 mg via ORAL
  Filled 2015-09-09: qty 1

## 2015-09-09 MED ORDER — LOSARTAN POTASSIUM 25 MG PO TABS
25.0000 mg | ORAL_TABLET | Freq: Every day | ORAL | Status: DC
  Administered 2015-09-10: 25 mg via ORAL
  Filled 2015-09-09: qty 1

## 2015-09-09 NOTE — Plan of Care (Signed)
Problem: Pain Managment: Goal: General experience of comfort will improve Outcome: Progressing Patient has not complained of chest pain/pressure even with decreasing the IV nitroglycerin.  Patient has complained of pain in his RLE.  PRN medication(s) given per MD orders for pain (see flowsheets for pain assessment(s) and reassessment(s) after prn medication given.

## 2015-09-09 NOTE — Plan of Care (Signed)
Problem: Safety: Goal: Ability to remain free from injury will improve Outcome: Progressing Per patient report, patient has had multiple falls in the last six months.  Due to fall risk history patient is a high fall risk.  Fall risk measures in place.  Safe environment being provided for patient per staff.

## 2015-09-09 NOTE — Progress Notes (Signed)
SUBJECTIVE: Had episode of chest pain which awoke him early this morning, none at present.     Intake/Output Summary (Last 24 hours) at 09/09/15 1200 Last data filed at 09/09/15 0813  Gross per 24 hour  Intake    297 ml  Output    970 ml  Net   -673 ml    Current Facility-Administered Medications  Medication Dose Route Frequency Provider Last Rate Last Dose  . acetaminophen (TYLENOL) tablet 650 mg  650 mg Oral Q4H PRN Rhonda G Barrett, PA-C      . albuterol (PROVENTIL HFA;VENTOLIN HFA) 108 (90 Base) MCG/ACT inhaler 2 puff  2 puff Inhalation Q6H PRN Rhonda G Barrett, PA-C      . aspirin EC tablet 81 mg  81 mg Oral Daily Rhonda G Barrett, PA-C   81 mg at 09/09/15 0932  . atorvastatin (LIPITOR) tablet 80 mg  80 mg Oral Daily Evelene Croon Barrett, PA-C   80 mg at 09/09/15 0932  . budesonide-formoterol (SYMBICORT) 160-4.5 MCG/ACT inhaler 2 puff  2 puff Inhalation BID Evelene Croon Barrett, PA-C   2 puff at 09/09/15 1109  . carvedilol (COREG) tablet 37.5 mg  37.5 mg Oral BID WC Thayer Headings, MD   37.5 mg at 09/09/15 0931  . Chlorhexidine Gluconate Cloth 2 % PADS 6 each  6 each Topical Q0600 Thayer Headings, MD   6 each at 09/09/15 [phone]  . clopidogrel (PLAVIX) tablet 75 mg  75 mg Oral Daily Evelene Croon Barrett, PA-C   75 mg at 09/09/15 0932  . docusate sodium (COLACE) capsule 200 mg  200 mg Oral BID Evelene Croon Barrett, PA-C   200 mg at 09/09/15 0931  . feeding supplement (ENSURE ENLIVE) (ENSURE ENLIVE) liquid 237 mL  237 mL Oral TID BM Thayer Headings, MD   237 mL at 09/09/15 0932  . fluticasone (FLONASE) 50 MCG/ACT nasal spray 1 spray  1 spray Each Nare Daily Rhonda G Barrett, PA-C   1 spray at 09/09/15 0930  . furosemide (LASIX) tablet 20 mg  20 mg Oral Daily Rhonda G Barrett, PA-C   20 mg at 09/09/15 0931  . HYDROcodone-acetaminophen (NORCO) 7.5-325 MG per tablet 1 tablet  1 tablet Oral Q6H PRN Evelene Croon Barrett, PA-C   1 tablet at 09/09/15 1058  . hydrogen peroxide 3 % 10 application in  sodium chloride irrigation 0.9 % 500 mL irrigation   Irrigation q morning - 10a Mcarthur Rossetti, MD      . insulin aspart (novoLOG) injection 0-15 Units  0-15 Units Subcutaneous TID WC Evelene Croon Barrett, PA-C   3 Units at 09/09/15 [id number]  . insulin aspart (novoLOG) injection 0-5 Units  0-5 Units Subcutaneous QHS Rhonda G Barrett, PA-C   0 Units at 09/07/15 2200  . isosorbide mononitrate (IMDUR) 24 hr tablet 30 mg  30 mg Oral Daily Herminio Commons, MD      . lactulose (CHRONULAC) 10 GM/15ML solution 15 g  15 g Oral Daily Rhonda G Barrett, PA-C   15 g at 09/08/15 1118  . linagliptin (TRADJENTA) tablet 5 mg  5 mg Oral Daily Rhonda G Barrett, PA-C   5 mg at 09/09/15 0931  . LORazepam (ATIVAN) tablet 0.5 mg  0.5 mg Oral Q4H PRN Loistine Chance, MD      . Derrill Memo ON 09/10/2015] losartan (COZAAR) tablet 25 mg  25 mg Oral Daily Herminio Commons, MD      . magnesium hydroxide (  MILK OF MAGNESIA) suspension 15 mL  15 mL Oral Daily PRN Rhonda G Barrett, PA-C      . montelukast (SINGULAIR) tablet 10 mg  10 mg Oral QHS Rhonda G Barrett, PA-C   10 mg at 09/08/15 2230  . morphine 2 MG/ML injection 2 mg  2 mg Intravenous Q4H PRN Loistine Chance, MD   2 mg at 09/09/15 0528  . mupirocin ointment (BACTROBAN) 2 % 1 application  1 application Nasal BID Thayer Headings, MD   1 application at [id number] [phone]  . omega-3 acid ethyl esters (LOVAZA) capsule 2 g  2 g Oral q morning - 10a Rhonda G Barrett, PA-C   2 g at 09/09/15 0931  . ondansetron (ZOFRAN) injection 4 mg  4 mg Intravenous Q6H PRN Evelene Croon Barrett, PA-C   4 mg at 09/07/15 2241  . oxyCODONE (Oxy IR/ROXICODONE) immediate release tablet 5 mg  5 mg Oral Q6H PRN Evelene Croon Barrett, PA-C   5 mg at 09/09/15 [id number]  . pantoprazole (PROTONIX) EC tablet 40 mg  40 mg Oral QHS Rhonda G Barrett, PA-C   40 mg at 09/08/15 2233  . polyethylene glycol (MIRALAX / GLYCOLAX) packet 17 g  17 g Oral BID Rhonda G Barrett, PA-C   17 g at 09/08/15 1123  . potassium chloride SA (K-DUR,KLOR-CON)  CR tablet 10 mEq  10 mEq Oral Daily Evelene Croon Barrett, PA-C   10 mEq at 09/09/15 0931  . ranolazine (RANEXA) 12 hr tablet 1,000 mg  1,000 mg Oral BID Evelene Croon Barrett, PA-C   1,000 mg at 09/09/15 0931  . rivaroxaban (XARELTO) tablet 20 mg  20 mg Oral Q supper Evelene Croon Barrett, PA-C   20 mg at 09/08/15 1757  . sertraline (ZOLOFT) tablet 25 mg  25 mg Oral Daily Rhonda G Barrett, PA-C   25 mg at 09/09/15 0932  . sodium chloride (OCEAN) 0.65 % nasal spray 1 spray  1 spray Each Nare QID Evelene Croon Barrett, PA-C   1 spray at 09/08/15 2231  . sodium phosphate (FLEET) 7-19 GM/118ML enema 1 enema  1 enema Rectal BID PRN Evelene Croon Barrett, PA-C      . tiotropium (SPIRIVA) inhalation capsule 18 mcg  18 mcg Inhalation Daily Rhonda G Barrett, PA-C   18 mcg at 09/09/15 1108  . zolpidem (AMBIEN) tablet 5 mg  5 mg Oral QHS PRN Thayer Headings, MD        Filed Vitals:   09/09/15 [id number] 09/09/15 0715 09/09/15 1110 09/09/15 1150  BP: 126/74 135/71  108/66  Pulse: 77 80  77  Temp:  98.7 F (37.1 C)  98.2 F (36.8 C)  TempSrc:  Oral  Oral  Resp: 20 14  13   Height:      Weight:      SpO2: 96% 94% 94% 94%    PHYSICAL EXAM General: NAD HEENT: Normal. Neck: No JVD, no thyromegaly.  Lungs: Clear to auscultation bilaterally with normal respiratory effort. CV: Nondisplaced PMI.  Regular rate and rhythm, normal S1/S2, no S3/S4, no murmur. Right leg in external fixation. Abdomen: Soft, nontender, no distention.  Neurologic: Alert and oriented x 3.  Psych: Normal affect.   TELEMETRY: Reviewed telemetry pt in sinus rhythm.  LABS: Basic Metabolic Panel:  Recent Labs  09/07/15 0801  NA 136  K 4.2  CL 101  CO2 24  GLUCOSE 142*  BUN 11  CREATININE 1.02  CALCIUM 9.0   Liver Function Tests: No results for input(s): AST,  ALT, ALKPHOS, BILITOT, PROT, ALBUMIN in the last 72 hours. No results for input(s): LIPASE, AMYLASE in the last 72 hours. CBC:  Recent Labs  09/07/15 0801  WBC 11.6*  HGB 10.4*  HCT  32.9*  MCV 84.1  PLT 543*   Cardiac Enzymes:  Recent Labs  09/07/15 1323 09/07/15 2039 09/08/15 0021  TROPONINI 0.05* 0.06* 0.06*   BNP: Invalid input(s): POCBNP D-Dimer: No results for input(s): DDIMER in the last 72 hours. Hemoglobin A1C: No results for input(s): HGBA1C in the last 72 hours. Fasting Lipid Panel: No results for input(s): CHOL, HDL, LDLCALC, TRIG, CHOLHDL, LDLDIRECT in the last 72 hours. Thyroid Function Tests: No results for input(s): TSH, T4TOTAL, T3FREE, THYROIDAB in the last 72 hours.  Invalid input(s): FREET3 Anemia Panel: No results for input(s): VITAMINB12, FOLATE, FERRITIN, TIBC, IRON, RETICCTPCT in the last 72 hours.  RADIOLOGY: Ct Angio Head W/cm &/or Wo Cm  08/30/2015  CLINICAL DATA:  LEFT-sided weakness today.  Code stroke. EXAM: CT ANGIOGRAPHY HEAD AND NECK TECHNIQUE: Multidetector CT imaging of the head and neck was performed using the standard protocol during bolus administration of intravenous contrast. Multiplanar CT image reconstructions and MIPs were obtained to evaluate the vascular anatomy. Carotid stenosis measurements (when applicable) are obtained utilizing NASCET criteria, using the distal internal carotid diameter as the denominator. CONTRAST:  180mL OMNIPAQUE IOHEXOL 350 MG/ML SOLN COMPARISON:  CT head August 30, 2015 at 2042 hours. FINDINGS: CTA NECK Aortic arch: Normal appearance of the thoracic arch, normal branch pattern. Mild calcific atherosclerosis. The origins of the innominate, left Common carotid artery and subclavian artery are widely patent. Right carotid system: Common carotid artery is patent, coursing in a straight line fashion. Eccentric intimal thickening and calcific atherosclerosis results in very mild stenosis mid RIGHT Common carotid artery. Eccentric calcific atherosclerosis results in 65-70% stenosis RIGHT internal carotid artery, 17 mm segment. Mild poststenotic dilatation. Normal appearance of the included internal  carotid artery. Left carotid system: Common carotid artery is widely patent, coursing in a straight line fashion. Eccentric calcific atherosclerosis results in less than 50% stenosis. Normal appearance of the mid to distal cervical internal carotid artery. Vertebral arteries:Calcific atherosclerosis results in severe stenosis bilateral vertebral artery origins, with mild poststenotic dilatation distal LEFT V4 segment. LEFT vertebral artery is dominant. Extrinsic calcific atherosclerosis results in moderate tandem stenosis LEFT V4 segment. Skeleton: No acute osseous process though bone windows have not been submitted. Poor dentition with multiple dental caries, and absent teeth. C5-6 intradiscal calcifications, and findings of auto interbody arthrodesis. Severe C6-7 degenerative disc. Severe LEFT upper cervical facet arthropathy. Status post median sternotomy. Other neck: Soft tissues of the neck are nonacute though, not tailored for evaluation. CTA HEAD Anterior circulation: Patent cervical internal carotid arteries, petrous, cavernous and supra clinoid internal carotid arteries. Calcific atherosclerosis of the carotid siphons results in at least moderate stenosis of bilateral supraclinoid internal carotid arteries. Widely patent anterior communicating artery. Patent anterior and middle cerebral arteries. Mild stenosis proximal RIGHT A1 segment. Posterior circulation: RIGHT vertebral artery terminates in the posterior inferior cerebellar artery. Heavy calcification of distal LEFT V4 segment resulting at least moderate long segment of stenosis to proximal basilar artery which is patent. Bilateral small posterior communicating arteries are present. High-grade stenosis RIGHT P1 segment. Bilateral posterior cerebral arteries are patent. Mild tandem stenosis LEFT posterior cerebral artery compatible with atherosclerosis. No large vessel occlusion, dissection, contrast extravasation or aneurysm within the anterior nor  posterior circulation. No abnormal intracranial enhancement. IMPRESSION: CTA NECK: Atherosclerosis resulting  in 65-70% stenosis RIGHT internal carotid artery origin. Less than 50% stenosis LEFT internal carotid artery origin. High-grade stenosis bilateral vertebral artery origins, which are patent. CTA HEAD:  No acute large vessel occlusion. Moderate stenosis of bilateral supraclinoid internal carotid artery due to calcific atherosclerosis. Moderate stenosis LEFT V4 segment. High-grade stenosis RIGHT P1 segment, complete circle of Willis. Electronically Signed   By: Elon Alas M.D.   On: 08/30/2015 22:15   Dg Chest 2 View  09/07/2015  CLINICAL DATA:  Chest pain and shortness of breath since last evening. EXAM: CHEST  2 VIEW COMPARISON:  08/31/2015 FINDINGS: The heart is mildly enlarged but stable. There is mild tortuosity and calcification of the thoracic aorta. Stable surgical changes from bypass surgery. There is mild vascular congestion without overt pulmonary edema. There are small bilateral pleural effusions without definite infiltrates. Stable eventration of the right hemidiaphragm. IMPRESSION: Mild cardiac enlargement and moderate vascular congestion without overt pulmonary edema. Small bilateral pleural effusions with overlying atelectasis. Electronically Signed   By: Marijo Sanes M.D.   On: 09/07/2015 08:18   Dg Ankle 2 Views Right  09/02/2015  CLINICAL DATA:  Ankle fracture. EXAM: RIGHT ANKLE - 2 VIEW COMPARISON:  02/05/2015 right tibia/fibula radiographs. FINDINGS: External fixation hardware overlies the right midfoot. No fracture, dislocation or suspicious focal osseous lesion is seen in the right ankle. There is diffuse osteopenia. Vascular calcifications are seen throughout the soft tissues. Small Achilles right calcaneal spur. Staples overlie the posterior distal right lower extremity at the level of the distal right tibial shaft. IMPRESSION: External fixation hardware overlies the right  midfoot. No fracture or dislocation is seen in the right ankle. Diffuse osteopenia. Electronically Signed   By: Ilona Sorrel M.D.   On: 09/02/2015 18:12   Ct Head Wo Contrast  09/01/2015  CLINICAL DATA:  Left facial droop.  Blurred vision 1 week ago EXAM: CT HEAD WITHOUT CONTRAST TECHNIQUE: Contiguous axial images were obtained from the base of the skull through the vertex without intravenous contrast. COMPARISON:  08/30/2015 FINDINGS: There is atrophy and chronic small vessel disease changes. No acute intracranial abnormality. Specifically, no hemorrhage, hydrocephalus, mass lesion, acute infarction, or significant intracranial injury. No acute calvarial abnormality. Visualized paranasal sinuses and mastoids clear. Orbital soft tissues unremarkable. IMPRESSION: No acute intracranial abnormality. Atrophy, chronic microvascular disease. Electronically Signed   By: Rolm Baptise M.D.   On: 09/01/2015 10:30   Ct Head Wo Contrast  08/30/2015  CLINICAL DATA:  Code stroke.  Left facial droop. EXAM: CT HEAD WITHOUT CONTRAST TECHNIQUE: Contiguous axial images were obtained from the base of the skull through the vertex without intravenous contrast. COMPARISON:  04/03/2015. FINDINGS: There is no evidence for acute hemorrhage, hydrocephalus, mass lesion, or abnormal extra-axial fluid collection. No definite CT evidence for acute infarction. Diffuse loss of parenchymal volume is consistent with atrophy. Patchy low attenuation in the deep hemispheric and periventricular white matter is nonspecific, but likely reflects chronic microvascular ischemic demyelination. The visualized paranasal sinuses and mastoid air cells are clear. IMPRESSION: Stable.  No acute intracranial abnormality. Atrophy with chronic small vessel white matter ischemic disease. Critical Value/emergent results were called by me at the time of interpretation on 08/30/2015 at 8:23 pm to Dr. Milton Ferguson , who verbally acknowledged these results.  Electronically Signed   By: Misty Stanley M.D.   On: 08/30/2015 20:23   Ct Angio Neck W/cm &/or Wo/cm  08/30/2015  CLINICAL DATA:  LEFT-sided weakness today.  Code stroke. EXAM: CT ANGIOGRAPHY HEAD  AND NECK TECHNIQUE: Multidetector CT imaging of the head and neck was performed using the standard protocol during bolus administration of intravenous contrast. Multiplanar CT image reconstructions and MIPs were obtained to evaluate the vascular anatomy. Carotid stenosis measurements (when applicable) are obtained utilizing NASCET criteria, using the distal internal carotid diameter as the denominator. CONTRAST:  139mL OMNIPAQUE IOHEXOL 350 MG/ML SOLN COMPARISON:  CT head August 30, 2015 at 2042 hours. FINDINGS: CTA NECK Aortic arch: Normal appearance of the thoracic arch, normal branch pattern. Mild calcific atherosclerosis. The origins of the innominate, left Common carotid artery and subclavian artery are widely patent. Right carotid system: Common carotid artery is patent, coursing in a straight line fashion. Eccentric intimal thickening and calcific atherosclerosis results in very mild stenosis mid RIGHT Common carotid artery. Eccentric calcific atherosclerosis results in 65-70% stenosis RIGHT internal carotid artery, 17 mm segment. Mild poststenotic dilatation. Normal appearance of the included internal carotid artery. Left carotid system: Common carotid artery is widely patent, coursing in a straight line fashion. Eccentric calcific atherosclerosis results in less than 50% stenosis. Normal appearance of the mid to distal cervical internal carotid artery. Vertebral arteries:Calcific atherosclerosis results in severe stenosis bilateral vertebral artery origins, with mild poststenotic dilatation distal LEFT V4 segment. LEFT vertebral artery is dominant. Extrinsic calcific atherosclerosis results in moderate tandem stenosis LEFT V4 segment. Skeleton: No acute osseous process though bone windows have not been  submitted. Poor dentition with multiple dental caries, and absent teeth. C5-6 intradiscal calcifications, and findings of auto interbody arthrodesis. Severe C6-7 degenerative disc. Severe LEFT upper cervical facet arthropathy. Status post median sternotomy. Other neck: Soft tissues of the neck are nonacute though, not tailored for evaluation. CTA HEAD Anterior circulation: Patent cervical internal carotid arteries, petrous, cavernous and supra clinoid internal carotid arteries. Calcific atherosclerosis of the carotid siphons results in at least moderate stenosis of bilateral supraclinoid internal carotid arteries. Widely patent anterior communicating artery. Patent anterior and middle cerebral arteries. Mild stenosis proximal RIGHT A1 segment. Posterior circulation: RIGHT vertebral artery terminates in the posterior inferior cerebellar artery. Heavy calcification of distal LEFT V4 segment resulting at least moderate long segment of stenosis to proximal basilar artery which is patent. Bilateral small posterior communicating arteries are present. High-grade stenosis RIGHT P1 segment. Bilateral posterior cerebral arteries are patent. Mild tandem stenosis LEFT posterior cerebral artery compatible with atherosclerosis. No large vessel occlusion, dissection, contrast extravasation or aneurysm within the anterior nor posterior circulation. No abnormal intracranial enhancement. IMPRESSION: CTA NECK: Atherosclerosis resulting in 65-70% stenosis RIGHT internal carotid artery origin. Less than 50% stenosis LEFT internal carotid artery origin. High-grade stenosis bilateral vertebral artery origins, which are patent. CTA HEAD:  No acute large vessel occlusion. Moderate stenosis of bilateral supraclinoid internal carotid artery due to calcific atherosclerosis. Moderate stenosis LEFT V4 segment. High-grade stenosis RIGHT P1 segment, complete circle of Willis. Electronically Signed   By: Elon Alas M.D.   On: 08/30/2015  22:15   US Carotid Bilateral  08/31/2015  CLINICAL DATA:  Stroke.  Carotid artery disease on recent CTA. EXAM: BILATERAL CAROTID DUPLEX ULTRASOUND TECHNIQUE: Pearline Cables scale imaging, color Doppler and duplex ultrasound were performed of bilateral carotid and vertebral arteries in the neck. COMPARISON:  07/17/2010 and CTA 08/30/2015 FINDINGS: Criteria: Quantification of carotid stenosis is based on velocity parameters that correlate the residual internal carotid diameter with NASCET-based stenosis levels, using the diameter of the distal internal carotid lumen as the denominator for stenosis measurement. The following velocity measurements were obtained: RIGHT ICA:  465 cm/sec  CCA:  94 cm/sec SYSTOLIC ICA/CCA RATIO:  4.9 DIASTOLIC ICA/CCA RATIO:  22 ECA:  90 cm/sec LEFT ICA:  129 cm/sec CCA:  [id number] cm/sec SYSTOLIC ICA/CCA RATIO:  1.4 DIASTOLIC ICA/CCA RATIO:  1.6 ECA:  99 cm/sec RIGHT CAROTID ARTERY: Small amount of circumferential echogenic plaque in the distal common carotid artery. Circumferential plaque at the right carotid bulb. Echogenic plaque and critical stenosis in the proximal and mid right internal carotid artery. There is a markedly elevated peak systolic velocity measuring up to 465 cm/sec in the mid internal carotid artery with turbulent flow. Peak systolic velocity in the distal internal carotid artery is also elevated measuring up to 213 cm/sec. RIGHT VERTEBRAL ARTERY: Antegrade flow and normal waveform in the right vertebral artery. LEFT CAROTID ARTERY: Small amount of echogenic plaque throughout the left common carotid artery. Small amount of plaque in the left carotid bulb. Echogenic plaque in the proximal internal carotid artery. Peak systolic velocity in the internal carotid artery is mildly elevated, measuring up to 129 cm/sec. LEFT VERTEBRAL ARTERY:  Not confidently visualized. IMPRESSION: Atherosclerotic plaque in the right internal carotid artery with a critical stenosis. Estimated degree of  stenosis is greater than 70% and suspect a near occlusion. Atherosclerotic plaque involving the left internal carotid artery. Estimated degree of stenosis in left internal carotid artery is less than 50%. Patent right vertebral artery. Left vertebral artery is not visualized. This vessel was better characterized on the recent CTA examination. These results will be called to the ordering clinician or representative by the Radiologist Assistant, and communication documented in the PACS or zVision Dashboard. Electronically Signed   By: Markus Daft M.D.   On: 08/31/2015 16:22   Nm Myocar Multi W/spect W/wall Motion / Ef  09/01/2015   ST segment depression was noted during stress. There is ST depression in inferior and lateral precordial leads at baseline that is unchanged after injection  Findings consistent with prior inferior infarction with mild peri-infarct ischemia. There is evidence of inferolateral and lateral infarct with mild to moderate peri-infact ischemia.  The left ventricular ejection fraction is severely decreased (<30%).  This is a high risk study. High risk due to low ejection fraction and multiple perfusion defects. Of note recent echo shows LVEF 60-65%, suggesting probable nuclear technical issue with volume assessment.    Dg Chest Port 1 View  08/31/2015  CLINICAL DATA:  Chest pain and shortness of Breath EXAM: PORTABLE CHEST 1 VIEW COMPARISON:  08/27/2015 FINDINGS: Cardiac shadow is stable. Postsurgical changes are again noted. Lungs are clear bilaterally. No acute bony abnormality is seen. IMPRESSION: No active disease. Electronically Signed   By: Inez Catalina M.D.   On: 08/31/2015 12:55   Dg Chest Portable 1 View  08/27/2015  CLINICAL DATA:  Chest pain EXAM: PORTABLE CHEST 1 VIEW COMPARISON:  04/03/2015 FINDINGS: Cardiac shadow is within normal limits. Postsurgical changes are noted. Aortic calcifications are again seen. Lungs are well-aerated without focal infiltrate or sizable  effusion. No acute bony abnormality is seen. IMPRESSION: No active disease. Electronically Signed   By: Inez Catalina M.D.   On: 08/27/2015 17:03      ASSESSMENT AND PLAN: 1. CAD: Has severe LCx stenosis that is not amenable to PCI. He is not a candidate for redo CABG as it is too high risk. Coreg 37.5 mg bid being given in addition to Ranexa 1000 mg bid.  On nitro drip 10 mcg/min. Will stop this and start Imdur 30 mg, and reduce losartan so as  not to precipitate hypotension. Coreg can be increased further if need be. Palliative care has seen   2. Chronic diastolic heart failure: Euvolemic. No changes.  3. Essential HTN: Controlled. Monitor given reduction in losartan to facilitate Imdur.  4. Hyperlipidemia: On statin therapy.  5. PE: On Xarelto.  Dispo: Anticipate discharge on 2/26.  Kate Sable, M.D., F.A.C.C.

## 2015-09-09 NOTE — Plan of Care (Signed)
Problem: Physical Regulation: Goal: Ability to maintain clinical measurements within normal limits will improve Outcome: Progressing Blood pressure remains better controlled (see documented blood pressures in flowsheets).  RN has been titrating nitroglycerin drip down and blood pressures remain better controlled (see documented blood pressures in flowsheets).

## 2015-09-10 DIAGNOSIS — E43 Unspecified severe protein-calorie malnutrition: Secondary | ICD-10-CM

## 2015-09-10 DIAGNOSIS — I200 Unstable angina: Secondary | ICD-10-CM

## 2015-09-10 DIAGNOSIS — Z951 Presence of aortocoronary bypass graft: Secondary | ICD-10-CM

## 2015-09-10 DIAGNOSIS — I2510 Atherosclerotic heart disease of native coronary artery without angina pectoris: Secondary | ICD-10-CM

## 2015-09-10 LAB — GLUCOSE, CAPILLARY
GLUCOSE-CAPILLARY: 138 mg/dL — AB (ref 65–99)
GLUCOSE-CAPILLARY: 175 mg/dL — AB (ref 65–99)

## 2015-09-10 MED ORDER — ISOSORBIDE MONONITRATE ER 60 MG PO TB24: 60.0000 mg | ORAL_TABLET | Freq: Every day | ORAL | Status: DC

## 2015-09-10 MED ORDER — LOSARTAN POTASSIUM 25 MG PO TABS: 25.0000 mg | ORAL_TABLET | Freq: Every day | ORAL | Status: DC

## 2015-09-10 MED ORDER — ISOSORBIDE MONONITRATE ER 60 MG PO TB24
60.0000 mg | ORAL_TABLET | Freq: Every day | ORAL | Status: DC
  Administered 2015-09-10: 60 mg via ORAL
  Filled 2015-09-10: qty 1

## 2015-09-10 MED ORDER — CARVEDILOL 25 MG PO TABS: 37.5000 mg | ORAL_TABLET | Freq: Two times a day (BID) | ORAL | Status: AC

## 2015-09-10 NOTE — Progress Notes (Signed)
[facility] is agreeable to accept pt back today.  CSW has called and left a message for his wife re: transfer.  PTAR to be called at 1:00.  RN to call report.  Sasan Wilkie,LCSW W/E coverage [id number]

## 2015-09-10 NOTE — Progress Notes (Signed)
Subjective: He reports a little CP and dizziness.  He has no appetite and when he does eat, he gets nauseated.   Objective: Vital signs in last 24 hours: Temp:  [98.1 F (36.7 C)-98.6 F (37 C)] 98.6 F (37 C) (02/26 0521) Pulse Rate:  [72-80] 80 (02/26 0515) Resp:  [13-22] 15 (02/26 0515) BP: (92-128)/(56-66) 92/57 mmHg (02/26 0515) SpO2:  [89 %-99 %] 97 % (02/26 0515) Weight:  [167 lb (75.751 kg)] 167 lb (75.751 kg) (02/26 0521) Last BM Date: 09/08/15  Intake/Output from previous day: 02/25 0701 - 02/26 0700 In: 240 [P.O.:240] Out: 1500 [Urine:1500] Intake/Output this shift:    Medications Scheduled Meds: . aspirin EC  81 mg Oral Daily  . atorvastatin  80 mg Oral Daily  . budesonide-formoterol  2 puff Inhalation BID  . carvedilol  37.5 mg Oral BID WC  . Chlorhexidine Gluconate Cloth  6 each Topical Q0600  . clopidogrel  75 mg Oral Daily  . docusate sodium  200 mg Oral BID  . feeding supplement (ENSURE ENLIVE)  237 mL Oral TID BM  . fluticasone  1 spray Each Nare Daily  . furosemide  20 mg Oral Daily  . hydrogen peroxide 1/2 strength irrigation   Irrigation q morning - 10a  . insulin aspart  0-15 Units Subcutaneous TID WC  . insulin aspart  0-5 Units Subcutaneous QHS  . isosorbide mononitrate  30 mg Oral Daily  . lactulose  15 g Oral Daily  . linagliptin  5 mg Oral Daily  . losartan  25 mg Oral Daily  . montelukast  10 mg Oral QHS  . mupirocin ointment  1 application Nasal BID  . omega-3 acid ethyl esters  2 g Oral q morning - 10a  . pantoprazole  40 mg Oral QHS  . polyethylene glycol  17 g Oral BID  . potassium chloride  10 mEq Oral Daily  . ranolazine  1,000 mg Oral BID  . rivaroxaban  20 mg Oral Q supper  . sertraline  25 mg Oral Daily  . sodium chloride  1 spray Each Nare QID  . tiotropium  18 mcg Inhalation Daily   Continuous Infusions:  PRN Meds:.acetaminophen, albuterol, HYDROcodone-acetaminophen, LORazepam, magnesium hydroxide, morphine injection,  ondansetron (ZOFRAN) IV, oxyCODONE, sodium phosphate, zolpidem  PE: General appearance: alert, cooperative and no distress Neck: no JVD Lungs: clear to auscultation bilaterally Heart: regular rate and rhythm, S1, S2 normal, no murmur, click, rub or gallop Extremities: No LEE Pulses: radials 2+  Skin: warm pale and dry Neurologic: Grossly normal  Lab Results:   Recent Labs  09/07/15 0801  WBC 11.6*  HGB 10.4*  HCT 32.9*  PLT 543*   BMET  Recent Labs  09/07/15 0801  NA 136  K 4.2  CL 101  CO2 24  GLUCOSE 142*  BUN 11  CREATININE 1.02  CALCIUM 9.0    Assessment/Plan   Hx of CABG x3   Coronary artery disease due to lipid rich plaque   Angina pectoris (HCC) Has severe LCx stenosis that is not amenable to PCI. He is not a candidate for redo CABG as it is too high risk. Coreg 37.5 mg bid being given in addition to Ranexa 1000 mg bid.  On Imdur 30 mg as of yesterday, and reduced losartan .  Increase imdur to 60 and monitor BP.  Palliative care has seen    Essential hypertension, benign  A little hypotensive at 0500 however, the last 5 readings in the  room on the monitor are 160's.   I will try increasing Imdur to 60.     HLD:   On statin     Long term current use of anticoagulant therapy   Type 2 diabetes mellitus (HCC)   CKD (chronic kidney disease), stage II   Chronic diastolic CHF (congestive heart failure) ([place])  euvolemic   Stroke (cerebrum) ([place])   Encounter for palliative care  Full code.  With instructions for no more than 72hrs of vent with aggressive therapy during that time  period.    Goals of care, counseling/discussion   Severe protein calorie malnutrition  The patient reports having no appetite.  Consider adding mertazapine for appetite.         LOS: 3 days    HAGER, BRYAN PA-C 09/10/2015 7:25 AM  The patient was seen and examined, and I agree with the assessment and plan as documented above. Case discussed in detail with B. Hager PA-C.  Will discharge to [facility] on increased dose of Imdur 60 mg.   Kate Sable, MD, [facility]  09/10/2015 9:09 AM

## 2015-09-10 NOTE — Discharge Summary (Signed)
Discharge Summary    Patient ID: Henry [name],  MRN: [id number], DOB/AGE: 76-Jul-1942 76 y.o.  Admit date: 09/07/2015 Discharge date: 09/10/2015  Primary Care Provider: Sherrie Mustache Primary Cardiologist: Domenic Polite  Discharge Diagnoses    Principal Problem:   Angina pectoris [facility]) Active Problems:   Essential hypertension, benign   Long term current use of anticoagulant therapy   Type 2 diabetes mellitus ([place])   CKD (chronic kidney disease), stage II   Chronic diastolic CHF (congestive heart failure) ([place])   Stroke (cerebrum) ([place])   Encounter for palliative care   Goals of care, counseling/discussion   Coronary artery disease due to lipid rich plaque   Hx of CABG x3   Severe protein-calorie malnutrition (HCC)   Allergies Allergies  Allergen Reactions  . Ace Inhibitors Cough    Cough  . Gabapentin Other (See Comments)    WAS BEHAVING VERY ODDLY    Diagnostic Studies/Procedures    Procedures    Left Heart Cath and Cors/Grafts Angiography    Conclusion    1. Ost LM to LM lesion, 65% stenosed. 2. Ost Cx lesion, 95% stenosed. 3. Ost Cx to Prox Cx lesion, 65% stenosed. The lesion was previously treated with a stent (unknown type) greater than two years ago. 4. Lat 1st Mrg lesion, 100% stenosed - with known occluded SVG. Ost 1st Mrg to 1st Mrg lesion, 85% stenosed. 5. Ost LAD to Mid LAD lesion, 65% stenosed. Mid LAD lesion, 100% stenosed. - Chronic 6. Dist LAD lesion, 75% stenosed. - Chronic 7. Prox RCA to Mid RCA lesion, 100% stenosed. Fills via Cx-RPL Collaterals Mid RCA to Dist RCA lesion, 55% stenosed. 8. LIMA was injected is normal in caliber, and is anatomically normal.   The patient has severe multivessel disease. The most notable change from prior catheterization is the heavily calcified ostial circumflex lesion that goes back into the left main. Restenosis in the proximal circumflex stent as well. This is a lesion that would clearly require  rotational atherectomy. Is not a guarantee that we could pass a wire down this vessel, but it deathly would require rotational atherectomy. The residual distal circumflex-OM disease is stable and therefore be treated medically.    Diagnostic Diagram           _____________   History of Present Illness     Henry [name] is a 75 y.o. 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 Other hx is COPD and Lupus.   He has a ruptured Achilles tendon and has an external fixation device in place on his right lower extremity, managed by an orthopedist at [facility].  Admit 02/12-02/14 for chest pain, med rx recommended.  Admit 02/15-02/22 after admit for CVA, Cards saw for elevated troponin and chest pain, s/p cath 02/21, results above, med rx recommended. Cath showed severe calcified LCx stenosis, a very complex lesion. The RCA is occluded and fillsfrom left to right collaterals. LIMA-LAD is his only patent graft.  Mr. Mccullick admits that his chest pain level has not been 0 for well over a week. He states he constantly has chest pressure at a 2-3/10. He went to the nursing home yesterday for rehabilitation. He has been constipated, he believes from narcotics for his leg pain. Today, he was straining at bowel, and his chest pain increased to a 10/10. He was  given sublingual nitroglycerin and a total of 4 baby aspirins between facility staff and EMS. His pain improved. He is currently having chest pain at a 6 or 7/10. He also describes some nausea and lower abdominal discomfort from constipation. He had some shortness of breath when the pain was severe but feels okay now.  Hospital Course     Consultants:  Nutrition, palliative care  She was admitted with angina pectoris. An IV nitroglycerin and Imdur was initially held. He has multiple comorbidities and redo bypass surgery is not felt  to be the best option.  Again on Plavix and Xarelto. Actos was increased to twice daily because of constipation.  He was seen by palliative care for consult.  His DNR status is full: "He would like to undergo CPR and be placed on mechanical ventilation for a total of 72 hours. He states he is instructed his wife to take him off of ventilator/life support if he does not improve after 3 days of being on aggressive measures."  Malnutrition related to chronic illness was diagnosed by nutrition consult. Patient has been drinking ensures frequently to improve. However, he's been complaining of decreased appetite for regular meals and as having some nausea.  Consider adding Mirtazapine.  He is currently getting Zoloft.  Is weaned off IV nitroglycerin and put back on indoor 30 mg he had a little bit of chest pain after that and was increased to 60 mg at discharge. He was switched from metoprolol 25 mg twice daily to Coreg 37.5 Mg twice daily with Ranexa 1000 twice a day.  It was reduced in order not to precipitate hypotension.  The patient was seen by Dr. Jacinta Shoe who felt he was stable for DC to skilled nursing facility.    Continue to titrate Imdur as blood pressure will allow in the office. _____________  Discharge Vitals Blood pressure 159/74, pulse 80, temperature 98.3 F (36.8 C), temperature source Oral, resp. rate 15, height 5\' 8"  (1.727 m), weight 167 lb (75.751 kg), SpO2 97 %.  Filed Weights   09/08/15 0524 09/09/15 0537 09/10/15 0521  Weight: 166 lb 9.6 oz (75.569 kg) 167 lb 6.4 oz (75.932 kg) 167 lb (75.751 kg)    Labs & Radiologic Studies      Recent Labs  09/07/15 1323 09/07/15 2039 09/08/15 0021  TROPONINI 0.05* 0.06* 0.06*    Ct Angio Head W/cm &/or Wo Cm  08/30/2015  CLINICAL DATA:  LEFT-sided weakness today.  Code stroke. EXAM: CT ANGIOGRAPHY HEAD AND NECK TECHNIQUE: Multidetector CT imaging of the head and neck was performed using the standard protocol during bolus  administration of intravenous contrast. Multiplanar CT image reconstructions and MIPs were obtained to evaluate the vascular anatomy. Carotid stenosis measurements (when applicable) are obtained utilizing NASCET criteria, using the distal internal carotid diameter as the denominator. CONTRAST:  137mL OMNIPAQUE IOHEXOL 350 MG/ML SOLN COMPARISON:  CT head August 30, 2015 at 2042 hours. FINDINGS: CTA NECK Aortic arch: Normal appearance of the thoracic arch, normal branch pattern. Mild calcific atherosclerosis. The origins of the innominate, left Common carotid artery and subclavian artery are widely patent. Right carotid system: Common carotid artery is patent, coursing in a straight line fashion. Eccentric intimal thickening and calcific atherosclerosis results in very mild stenosis mid RIGHT Common carotid artery. Eccentric calcific atherosclerosis results in 65-70% stenosis RIGHT internal carotid artery, 17 mm segment. Mild poststenotic dilatation. Normal appearance of the included internal carotid artery. Left carotid system: Common carotid artery is widely patent, coursing in  a straight line fashion. Eccentric calcific atherosclerosis results in less than 50% stenosis. Normal appearance of the mid to distal cervical internal carotid artery. Vertebral arteries:Calcific atherosclerosis results in severe stenosis bilateral vertebral artery origins, with mild poststenotic dilatation distal LEFT V4 segment. LEFT vertebral artery is dominant. Extrinsic calcific atherosclerosis results in moderate tandem stenosis LEFT V4 segment. Skeleton: No acute osseous process though bone windows have not been submitted. Poor dentition with multiple dental caries, and absent teeth. C5-6 intradiscal calcifications, and findings of auto interbody arthrodesis. Severe C6-7 degenerative disc. Severe LEFT upper cervical facet arthropathy. Status post median sternotomy. Other neck: Soft tissues of the neck are nonacute though, not  tailored for evaluation. CTA HEAD Anterior circulation: Patent cervical internal carotid arteries, petrous, cavernous and supra clinoid internal carotid arteries. Calcific atherosclerosis of the carotid siphons results in at least moderate stenosis of bilateral supraclinoid internal carotid arteries. Widely patent anterior communicating artery. Patent anterior and middle cerebral arteries. Mild stenosis proximal RIGHT A1 segment. Posterior circulation: RIGHT vertebral artery terminates in the posterior inferior cerebellar artery. Heavy calcification of distal LEFT V4 segment resulting at least moderate long segment of stenosis to proximal basilar artery which is patent. Bilateral small posterior communicating arteries are present. High-grade stenosis RIGHT P1 segment. Bilateral posterior cerebral arteries are patent. Mild tandem stenosis LEFT posterior cerebral artery compatible with atherosclerosis. No large vessel occlusion, dissection, contrast extravasation or aneurysm within the anterior nor posterior circulation. No abnormal intracranial enhancement. IMPRESSION: CTA NECK: Atherosclerosis resulting in 65-70% stenosis RIGHT internal carotid artery origin. Less than 50% stenosis LEFT internal carotid artery origin. High-grade stenosis bilateral vertebral artery origins, which are patent. CTA HEAD:  No acute large vessel occlusion. Moderate stenosis of bilateral supraclinoid internal carotid artery due to calcific atherosclerosis. Moderate stenosis LEFT V4 segment. High-grade stenosis RIGHT P1 segment, complete circle of Willis. Electronically Signed   By: Elon Alas M.D.   On: 08/30/2015 22:15   Dg Chest 2 View  09/07/2015  CLINICAL DATA:  Chest pain and shortness of breath since last evening. EXAM: CHEST  2 VIEW COMPARISON:  08/31/2015 FINDINGS: The heart is mildly enlarged but stable. There is mild tortuosity and calcification of the thoracic aorta. Stable surgical changes from bypass surgery. There  is mild vascular congestion without overt pulmonary edema. There are small bilateral pleural effusions without definite infiltrates. Stable eventration of the right hemidiaphragm. IMPRESSION: Mild cardiac enlargement and moderate vascular congestion without overt pulmonary edema. Small bilateral pleural effusions with overlying atelectasis. Electronically Signed   By: Marijo Sanes M.D.   On: 09/07/2015 08:18   Dg Ankle 2 Views Right  09/02/2015  CLINICAL DATA:  Ankle fracture. EXAM: RIGHT ANKLE - 2 VIEW COMPARISON:  02/05/2015 right tibia/fibula radiographs. FINDINGS: External fixation hardware overlies the right midfoot. No fracture, dislocation or suspicious focal osseous lesion is seen in the right ankle. There is diffuse osteopenia. Vascular calcifications are seen throughout the soft tissues. Small Achilles right calcaneal spur. Staples overlie the posterior distal right lower extremity at the level of the distal right tibial shaft. IMPRESSION: External fixation hardware overlies the right midfoot. No fracture or dislocation is seen in the right ankle. Diffuse osteopenia. Electronically Signed   By: Ilona Sorrel M.D.   On: 09/02/2015 18:12   Ct Head Wo Contrast  09/01/2015  CLINICAL DATA:  Left facial droop.  Blurred vision 1 week ago EXAM: CT HEAD WITHOUT CONTRAST TECHNIQUE: Contiguous axial images were obtained from the base of the skull through the vertex without  intravenous contrast. COMPARISON:  08/30/2015 FINDINGS: There is atrophy and chronic small vessel disease changes. No acute intracranial abnormality. Specifically, no hemorrhage, hydrocephalus, mass lesion, acute infarction, or significant intracranial injury. No acute calvarial abnormality. Visualized paranasal sinuses and mastoids clear. Orbital soft tissues unremarkable. IMPRESSION: No acute intracranial abnormality. Atrophy, chronic microvascular disease. Electronically Signed   By: Rolm Baptise M.D.   On: 09/01/2015 10:30   Ct Head Wo  Contrast  08/30/2015  CLINICAL DATA:  Code stroke.  Left facial droop. EXAM: CT HEAD WITHOUT CONTRAST TECHNIQUE: Contiguous axial images were obtained from the base of the skull through the vertex without intravenous contrast. COMPARISON:  04/03/2015. FINDINGS: There is no evidence for acute hemorrhage, hydrocephalus, mass lesion, or abnormal extra-axial fluid collection. No definite CT evidence for acute infarction. Diffuse loss of parenchymal volume is consistent with atrophy. Patchy low attenuation in the deep hemispheric and periventricular white matter is nonspecific, but likely reflects chronic microvascular ischemic demyelination. The visualized paranasal sinuses and mastoid air cells are clear. IMPRESSION: Stable.  No acute intracranial abnormality. Atrophy with chronic small vessel white matter ischemic disease. Critical Value/emergent results were called by me at the time of interpretation on 08/30/2015 at 8:23 pm to Dr. Milton Ferguson , who verbally acknowledged these results. Electronically Signed   By: Misty Stanley M.D.   On: 08/30/2015 20:23   Ct Angio Neck W/cm &/or Wo/cm  08/30/2015  CLINICAL DATA:  LEFT-sided weakness today.  Code stroke. EXAM: CT ANGIOGRAPHY HEAD AND NECK TECHNIQUE: Multidetector CT imaging of the head and neck was performed using the standard protocol during bolus administration of intravenous contrast. Multiplanar CT image reconstructions and MIPs were obtained to evaluate the vascular anatomy. Carotid stenosis measurements (when applicable) are obtained utilizing NASCET criteria, using the distal internal carotid diameter as the denominator. CONTRAST:  148mL OMNIPAQUE IOHEXOL 350 MG/ML SOLN COMPARISON:  CT head August 30, 2015 at 2042 hours. FINDINGS: CTA NECK Aortic arch: Normal appearance of the thoracic arch, normal branch pattern. Mild calcific atherosclerosis. The origins of the innominate, left Common carotid artery and subclavian artery are widely patent. Right  carotid system: Common carotid artery is patent, coursing in a straight line fashion. Eccentric intimal thickening and calcific atherosclerosis results in very mild stenosis mid RIGHT Common carotid artery. Eccentric calcific atherosclerosis results in 65-70% stenosis RIGHT internal carotid artery, 17 mm segment. Mild poststenotic dilatation. Normal appearance of the included internal carotid artery. Left carotid system: Common carotid artery is widely patent, coursing in a straight line fashion. Eccentric calcific atherosclerosis results in less than 50% stenosis. Normal appearance of the mid to distal cervical internal carotid artery. Vertebral arteries:Calcific atherosclerosis results in severe stenosis bilateral vertebral artery origins, with mild poststenotic dilatation distal LEFT V4 segment. LEFT vertebral artery is dominant. Extrinsic calcific atherosclerosis results in moderate tandem stenosis LEFT V4 segment. Skeleton: No acute osseous process though bone windows have not been submitted. Poor dentition with multiple dental caries, and absent teeth. C5-6 intradiscal calcifications, and findings of auto interbody arthrodesis. Severe C6-7 degenerative disc. Severe LEFT upper cervical facet arthropathy. Status post median sternotomy. Other neck: Soft tissues of the neck are nonacute though, not tailored for evaluation. CTA HEAD Anterior circulation: Patent cervical internal carotid arteries, petrous, cavernous and supra clinoid internal carotid arteries. Calcific atherosclerosis of the carotid siphons results in at least moderate stenosis of bilateral supraclinoid internal carotid arteries. Widely patent anterior communicating artery. Patent anterior and middle cerebral arteries. Mild stenosis proximal RIGHT A1 segment. Posterior circulation: RIGHT  vertebral artery terminates in the posterior inferior cerebellar artery. Heavy calcification of distal LEFT V4 segment resulting at least moderate long segment of  stenosis to proximal basilar artery which is patent. Bilateral small posterior communicating arteries are present. High-grade stenosis RIGHT P1 segment. Bilateral posterior cerebral arteries are patent. Mild tandem stenosis LEFT posterior cerebral artery compatible with atherosclerosis. No large vessel occlusion, dissection, contrast extravasation or aneurysm within the anterior nor posterior circulation. No abnormal intracranial enhancement. IMPRESSION: CTA NECK: Atherosclerosis resulting in 65-70% stenosis RIGHT internal carotid artery origin. Less than 50% stenosis LEFT internal carotid artery origin. High-grade stenosis bilateral vertebral artery origins, which are patent. CTA HEAD:  No acute large vessel occlusion. Moderate stenosis of bilateral supraclinoid internal carotid artery due to calcific atherosclerosis. Moderate stenosis LEFT V4 segment. High-grade stenosis RIGHT P1 segment, complete circle of Willis. Electronically Signed   By: Elon Alas M.D.   On: 08/30/2015 22:15   US Carotid Bilateral  08/31/2015  CLINICAL DATA:  Stroke.  Carotid artery disease on recent CTA. EXAM: BILATERAL CAROTID DUPLEX ULTRASOUND TECHNIQUE: Pearline Cables scale imaging, color Doppler and duplex ultrasound were performed of bilateral carotid and vertebral arteries in the neck. COMPARISON:  07/17/2010 and CTA 08/30/2015 FINDINGS: Criteria: Quantification of carotid stenosis is based on velocity parameters that correlate the residual internal carotid diameter with NASCET-based stenosis levels, using the diameter of the distal internal carotid lumen as the denominator for stenosis measurement. The following velocity measurements were obtained: RIGHT ICA:  465 cm/sec CCA:  94 cm/sec SYSTOLIC ICA/CCA RATIO:  4.9 DIASTOLIC ICA/CCA RATIO:  22 ECA:  90 cm/sec LEFT ICA:  129 cm/sec CCA:  [id number] cm/sec SYSTOLIC ICA/CCA RATIO:  1.4 DIASTOLIC ICA/CCA RATIO:  1.6 ECA:  99 cm/sec RIGHT CAROTID ARTERY: Small amount of circumferential echogenic  plaque in the distal common carotid artery. Circumferential plaque at the right carotid bulb. Echogenic plaque and critical stenosis in the proximal and mid right internal carotid artery. There is a markedly elevated peak systolic velocity measuring up to 465 cm/sec in the mid internal carotid artery with turbulent flow. Peak systolic velocity in the distal internal carotid artery is also elevated measuring up to 213 cm/sec. RIGHT VERTEBRAL ARTERY: Antegrade flow and normal waveform in the right vertebral artery. LEFT CAROTID ARTERY: Small amount of echogenic plaque throughout the left common carotid artery. Small amount of plaque in the left carotid bulb. Echogenic plaque in the proximal internal carotid artery. Peak systolic velocity in the internal carotid artery is mildly elevated, measuring up to 129 cm/sec. LEFT VERTEBRAL ARTERY:  Not confidently visualized. IMPRESSION: Atherosclerotic plaque in the right internal carotid artery with a critical stenosis. Estimated degree of stenosis is greater than 70% and suspect a near occlusion. Atherosclerotic plaque involving the left internal carotid artery. Estimated degree of stenosis in left internal carotid artery is less than 50%. Patent right vertebral artery. Left vertebral artery is not visualized. This vessel was better characterized on the recent CTA examination. These results will be called to the ordering clinician or representative by the Radiologist Assistant, and communication documented in the PACS or zVision Dashboard. Electronically Signed   By: Markus Daft M.D.   On: 08/31/2015 16:22   Nm Myocar Multi W/spect W/wall Motion / Ef  09/01/2015   ST segment depression was noted during stress. There is ST depression in inferior and lateral precordial leads at baseline that is unchanged after injection  Findings consistent with prior inferior infarction with mild peri-infarct ischemia. There is evidence of inferolateral and  lateral infarct with mild to  moderate peri-infact ischemia.  The left ventricular ejection fraction is severely decreased (<30%).  This is a high risk study. High risk due to low ejection fraction and multiple perfusion defects. Of note recent echo shows LVEF 60-65%, suggesting probable nuclear technical issue with volume assessment.    Dg Chest Port 1 View  08/31/2015  CLINICAL DATA:  Chest pain and shortness of Breath EXAM: PORTABLE CHEST 1 VIEW COMPARISON:  08/27/2015 FINDINGS: Cardiac shadow is stable. Postsurgical changes are again noted. Lungs are clear bilaterally. No acute bony abnormality is seen. IMPRESSION: No active disease. Electronically Signed   By: Inez Catalina M.D.   On: 08/31/2015 12:55   Dg Chest Portable 1 View  08/27/2015  CLINICAL DATA:  Chest pain EXAM: PORTABLE CHEST 1 VIEW COMPARISON:  04/03/2015 FINDINGS: Cardiac shadow is within normal limits. Postsurgical changes are noted. Aortic calcifications are again seen. Lungs are well-aerated without focal infiltrate or sizable effusion. No acute bony abnormality is seen. IMPRESSION: No active disease. Electronically Signed   By: Inez Catalina M.D.   On: 08/27/2015 17:03    Disposition   Pt is being discharged home today in good condition.  Follow-up Plans & Appointments    Follow-up Information    Follow up with Ermalinda Barrios, PA-C On 09/13/2015.   Specialty:  Cardiology   Why:  11:20 AM   Contact information:   [place] [id number] [phone]      Discharge Instructions    Diet - low sodium heart healthy    Complete by:  As directed            Discharge Medications   Current Discharge Medication List    START taking these medications   Details  carvedilol (COREG) 25 MG tablet Take 1.5 tablets (37.5 mg total) by mouth 2 (two) times daily with a meal. Qty: 90 tablet, Refills: 11      CONTINUE these medications which have CHANGED   Details  isosorbide mononitrate (IMDUR) 60 MG 24 hr tablet Take 1 tablet (60 mg total)  by mouth daily. Qty: 30 tablet, Refills: 11    losartan (COZAAR) 25 MG tablet Take 1 tablet (25 mg total) by mouth daily. Qty: 30 tablet, Refills: 11      CONTINUE these medications which have NOT CHANGED   Details  acetaminophen (TYLENOL) 325 MG tablet Take 650 mg by mouth every 4 (four) hours as needed for mild pain. For pain    albuterol (PROAIR HFA) 108 (90 BASE) MCG/ACT inhaler Inhale 2 puffs into the lungs every 6 (six) hours as needed for wheezing or shortness of breath.     atorvastatin (LIPITOR) 80 MG tablet Take 1 tablet (80 mg total) by mouth daily. Qty: 30 tablet, Refills: 0    budesonide-formoterol (SYMBICORT) 160-4.5 MCG/ACT inhaler Inhale 2 puffs into the lungs 2 (two) times daily.    clopidogrel (PLAVIX) 75 MG tablet Take 1 tablet (75 mg total) by mouth daily. Qty: 30 tablet, Refills: 2    Coenzyme Q10 (CO Q 10) 100 MG CAPS Take 100 mg by mouth daily.    docusate sodium (COLACE) 100 MG capsule Take 100 mg by mouth 2 (two) times daily.    fish oil-omega-3 fatty acids 1000 MG capsule Take 2 g by mouth every morning.     fluticasone (FLONASE) 50 MCG/ACT nasal spray Place 1 spray into both nostrils daily.     furosemide (LASIX) 20 MG tablet Take 20 mg  by mouth daily.    HYDROcodone-acetaminophen (NORCO) 7.5-325 MG tablet Take 1 tablet by mouth every 6 (six) hours as needed for moderate pain. Qty: 15 tablet, Refills: 0    lactulose (CHRONULAC) 10 GM/15ML solution Take 15 g by mouth daily.     magnesium hydroxide (MILK OF MAGNESIA) 400 MG/5ML suspension Take 15 mLs by mouth daily as needed for mild constipation.    montelukast (SINGULAIR) 10 MG tablet Take 10 mg by mouth at bedtime.    nitroGLYCERIN (NITROSTAT) 0.4 MG SL tablet Place 1 tablet (0.4 mg total) under the tongue every 5 (five) minutes x 3 doses as needed. For chest pain Qty: 25 tablet, Refills: 3    omeprazole (PRILOSEC) 40 MG capsule Take 40 mg by mouth every evening.    oxyCODONE (OXY  IR/ROXICODONE) 5 MG immediate release tablet Take 5 mg by mouth every 6 (six) hours as needed for moderate pain or severe pain.    polyethylene glycol (MIRALAX / GLYCOLAX) packet Take 17 g by mouth daily.    potassium chloride (K-DUR,KLOR-CON) 10 MEQ tablet Take 10 mEq by mouth daily.    ranolazine (RANEXA) 1000 MG SR tablet Take 1 tablet (1,000 mg total) by mouth 2 (two) times daily. Qty: 60 tablet, Refills: 1    rivaroxaban (XARELTO) 20 MG TABS tablet Take 20 mg by mouth daily.    sertraline (ZOLOFT) 25 MG tablet Take 1 tablet (25 mg total) by mouth daily. Qty: 30 tablet, Refills: 1    sitaGLIPtin (JANUVIA) 25 MG tablet Take 1 tablet (25 mg total) by mouth daily. Qty: 30 tablet, Refills: 0    sodium chloride (OCEAN) 0.65 % SOLN nasal spray Place 1 spray into both nostrils 4 (four) times daily.    tiotropium (SPIRIVA) 18 MCG inhalation capsule Place 1 capsule (18 mcg total) into inhaler and inhale daily. Qty: 30 capsule, Refills: 5    zinc oxide 20 % ointment Apply topically every Monday. Qty: 56.7 g, Refills: 0    zolpidem (AMBIEN) 10 MG tablet Take 1 tablet (10 mg total) by mouth at bedtime as needed for sleep. Qty: 30 tablet, Refills: 2      STOP taking these medications     metoprolol (LOPRESSOR) 50 MG tablet      potassium chloride (K-DUR) 10 MEQ tablet           Outstanding Labs/Studies     Duration of Discharge Encounter   Greater than 30 minutes including physician time.  Signed, Bladen Umar, Charles PAC 09/10/2015, 11:03 AM

## 2015-09-10 NOTE — Progress Notes (Signed)
Patient discharged to [facility], Report called and PTAR transporting to facility.

## 2015-09-13 ENCOUNTER — Encounter: Payer: Medicare Other | Admitting: Physician Assistant

## 2015-09-23 ENCOUNTER — Inpatient Hospital Stay (HOSPITAL_COMMUNITY)
Admission: EM | Admit: 2015-09-23 | Discharge: 2015-09-27 | DRG: 291 | Disposition: A | Discharge status: Home / Self Care | Payer: Medicare Other | Attending: Cardiovascular Disease | Admitting: Cardiovascular Disease

## 2015-09-23 ENCOUNTER — Encounter (HOSPITAL_COMMUNITY): Payer: Self-pay

## 2015-09-23 ENCOUNTER — Emergency Department (HOSPITAL_COMMUNITY): Payer: Medicare Other

## 2015-09-23 DIAGNOSIS — N183 Chronic kidney disease, stage 3 unspecified: Secondary | ICD-10-CM | POA: Diagnosis present

## 2015-09-23 DIAGNOSIS — E11621 Type 2 diabetes mellitus with foot ulcer: Secondary | ICD-10-CM | POA: Diagnosis present

## 2015-09-23 DIAGNOSIS — J9690 Respiratory failure, unspecified, unspecified whether with hypoxia or hypercapnia: Secondary | ICD-10-CM | POA: Diagnosis present

## 2015-09-23 DIAGNOSIS — I10 Essential (primary) hypertension: Secondary | ICD-10-CM | POA: Diagnosis not present

## 2015-09-23 DIAGNOSIS — D6862 Lupus anticoagulant syndrome: Secondary | ICD-10-CM | POA: Diagnosis present

## 2015-09-23 DIAGNOSIS — L97519 Non-pressure chronic ulcer of other part of right foot with unspecified severity: Secondary | ICD-10-CM | POA: Diagnosis present

## 2015-09-23 DIAGNOSIS — I214 Non-ST elevation (NSTEMI) myocardial infarction: Secondary | ICD-10-CM

## 2015-09-23 DIAGNOSIS — Z951 Presence of aortocoronary bypass graft: Secondary | ICD-10-CM

## 2015-09-23 DIAGNOSIS — I130 Hypertensive heart and chronic kidney disease with heart failure and stage 1 through stage 4 chronic kidney disease, or unspecified chronic kidney disease: Secondary | ICD-10-CM | POA: Diagnosis present

## 2015-09-23 DIAGNOSIS — Z515 Encounter for palliative care: Secondary | ICD-10-CM | POA: Diagnosis not present

## 2015-09-23 DIAGNOSIS — J9600 Acute respiratory failure, unspecified whether with hypoxia or hypercapnia: Secondary | ICD-10-CM | POA: Diagnosis not present

## 2015-09-23 DIAGNOSIS — E43 Unspecified severe protein-calorie malnutrition: Secondary | ICD-10-CM | POA: Diagnosis present

## 2015-09-23 DIAGNOSIS — R760 Raised antibody titer: Secondary | ICD-10-CM | POA: Diagnosis present

## 2015-09-23 DIAGNOSIS — I252 Old myocardial infarction: Secondary | ICD-10-CM

## 2015-09-23 DIAGNOSIS — I5033 Acute on chronic diastolic (congestive) heart failure: Secondary | ICD-10-CM | POA: Diagnosis not present

## 2015-09-23 DIAGNOSIS — Z66 Do not resuscitate: Secondary | ICD-10-CM | POA: Diagnosis present

## 2015-09-23 DIAGNOSIS — D649 Anemia, unspecified: Secondary | ICD-10-CM | POA: Diagnosis present

## 2015-09-23 DIAGNOSIS — D62 Acute posthemorrhagic anemia: Secondary | ICD-10-CM | POA: Diagnosis not present

## 2015-09-23 DIAGNOSIS — R079 Chest pain, unspecified: Secondary | ICD-10-CM

## 2015-09-23 DIAGNOSIS — E785 Hyperlipidemia, unspecified: Secondary | ICD-10-CM | POA: Diagnosis present

## 2015-09-23 DIAGNOSIS — Z8673 Personal history of transient ischemic attack (TIA), and cerebral infarction without residual deficits: Secondary | ICD-10-CM | POA: Diagnosis not present

## 2015-09-23 DIAGNOSIS — G4733 Obstructive sleep apnea (adult) (pediatric): Secondary | ICD-10-CM | POA: Diagnosis present

## 2015-09-23 DIAGNOSIS — I5032 Chronic diastolic (congestive) heart failure: Secondary | ICD-10-CM | POA: Diagnosis present

## 2015-09-23 DIAGNOSIS — I272 Other secondary pulmonary hypertension: Secondary | ICD-10-CM | POA: Diagnosis present

## 2015-09-23 DIAGNOSIS — L97529 Non-pressure chronic ulcer of other part of left foot with unspecified severity: Secondary | ICD-10-CM | POA: Diagnosis present

## 2015-09-23 DIAGNOSIS — Z6823 Body mass index (BMI) 23.0-23.9, adult: Secondary | ICD-10-CM | POA: Diagnosis not present

## 2015-09-23 DIAGNOSIS — J449 Chronic obstructive pulmonary disease, unspecified: Secondary | ICD-10-CM | POA: Diagnosis present

## 2015-09-23 DIAGNOSIS — E1159 Type 2 diabetes mellitus with other circulatory complications: Secondary | ICD-10-CM | POA: Diagnosis present

## 2015-09-23 DIAGNOSIS — Z87891 Personal history of nicotine dependence: Secondary | ICD-10-CM | POA: Diagnosis not present

## 2015-09-23 DIAGNOSIS — Z7902 Long term (current) use of antithrombotics/antiplatelets: Secondary | ICD-10-CM | POA: Diagnosis not present

## 2015-09-23 DIAGNOSIS — I25119 Atherosclerotic heart disease of native coronary artery with unspecified angina pectoris: Secondary | ICD-10-CM | POA: Diagnosis present

## 2015-09-23 DIAGNOSIS — Z7901 Long term (current) use of anticoagulants: Secondary | ICD-10-CM

## 2015-09-23 DIAGNOSIS — Z86711 Personal history of pulmonary embolism: Secondary | ICD-10-CM | POA: Diagnosis present

## 2015-09-23 DIAGNOSIS — E1122 Type 2 diabetes mellitus with diabetic chronic kidney disease: Secondary | ICD-10-CM | POA: Diagnosis present

## 2015-09-23 DIAGNOSIS — I25111 Atherosclerotic heart disease of native coronary artery with angina pectoris with documented spasm: Secondary | ICD-10-CM | POA: Diagnosis not present

## 2015-09-23 DIAGNOSIS — J810 Acute pulmonary edema: Secondary | ICD-10-CM | POA: Diagnosis present

## 2015-09-23 DIAGNOSIS — L97509 Non-pressure chronic ulcer of other part of unspecified foot with unspecified severity: Secondary | ICD-10-CM

## 2015-09-23 DIAGNOSIS — Z7984 Long term (current) use of oral hypoglycemic drugs: Secondary | ICD-10-CM

## 2015-09-23 DIAGNOSIS — K219 Gastro-esophageal reflux disease without esophagitis: Secondary | ICD-10-CM | POA: Diagnosis present

## 2015-09-23 DIAGNOSIS — Z8719 Personal history of other diseases of the digestive system: Secondary | ICD-10-CM

## 2015-09-23 DIAGNOSIS — Z7189 Other specified counseling: Secondary | ICD-10-CM | POA: Diagnosis not present

## 2015-09-23 LAB — BASIC METABOLIC PANEL
ANION GAP: 16 — AB (ref 5–15)
BUN: 8 mg/dL (ref 6–20)
CALCIUM: 9 mg/dL (ref 8.9–10.3)
CHLORIDE: 102 mmol/L (ref 101–111)
CO2: 21 mmol/L — AB (ref 22–32)
Creatinine, Ser: 0.93 mg/dL (ref 0.61–1.24)
GFR calc non Af Amer: >60 mL/min (ref >60)
Glucose, Bld: 159 mg/dL — ABNORMAL HIGH (ref 65–99)
Potassium: 3.2 mmol/L — ABNORMAL LOW (ref 3.5–5.1)
Sodium: 139 mmol/L (ref 135–145)

## 2015-09-23 LAB — CBC WITH DIFFERENTIAL/PLATELET
BASOS ABS: 0 10*3/uL (ref 0.0–0.1)
BASOS PCT: 0 %
Eosinophils Absolute: 0.1 10*3/uL (ref 0.0–0.7)
Eosinophils Relative: 1 %
HEMATOCRIT: 29.3 % — AB (ref 39.0–52.0)
HEMOGLOBIN: 9.3 g/dL — AB (ref 13.0–17.0)
Lymphocytes Relative: 16 %
Lymphs Abs: 1.9 10*3/uL (ref 0.7–4.0)
MCH: 25.7 pg — ABNORMAL LOW (ref 26.0–34.0)
MCHC: 31.7 g/dL (ref 30.0–36.0)
MCV: 80.9 fL (ref 78.0–100.0)
Monocytes Absolute: 0.6 10*3/uL (ref 0.1–1.0)
Monocytes Relative: 5 %
NEUTROS ABS: 9.3 10*3/uL — AB (ref 1.7–7.7)
NEUTROS PCT: 78 %
Platelets: 505 10*3/uL — ABNORMAL HIGH (ref 150–400)
RBC: 3.62 MIL/uL — ABNORMAL LOW (ref 4.22–5.81)
RDW: 17.4 % — ABNORMAL HIGH (ref 11.5–15.5)
WBC: 11.9 10*3/uL — ABNORMAL HIGH (ref 4.0–10.5)

## 2015-09-23 LAB — GLUCOSE, CAPILLARY: GLUCOSE-CAPILLARY: 237 mg/dL — AB (ref 65–99)

## 2015-09-23 LAB — I-STAT TROPONIN, ED: TROPONIN I, POC: 0.06 ng/mL (ref 0.00–0.08)

## 2015-09-23 LAB — BRAIN NATRIURETIC PEPTIDE: B NATRIURETIC PEPTIDE 5: 1089.3 pg/mL — AB (ref 0.0–100.0)

## 2015-09-23 LAB — TROPONIN I: Troponin I: 0.05 ng/mL — ABNORMAL HIGH (ref <0.031)

## 2015-09-23 MED ORDER — FUROSEMIDE 10 MG/ML IJ SOLN
40.0000 mg | Freq: Once | INTRAMUSCULAR | Status: AC
  Administered 2015-09-23: 40 mg via INTRAVENOUS
  Filled 2015-09-23: qty 4

## 2015-09-23 MED ORDER — ISOSORBIDE MONONITRATE ER 60 MG PO TB24
120.0000 mg | ORAL_TABLET | Freq: Every day | ORAL | Status: DC
Start: 2015-09-23 — End: 2015-09-23

## 2015-09-23 MED ORDER — PANTOPRAZOLE SODIUM 40 MG PO TBEC
40.0000 mg | DELAYED_RELEASE_TABLET | Freq: Every day | ORAL | Status: DC
Start: 2015-09-23 — End: 2015-09-27
  Administered 2015-09-23 – 2015-09-27 (×5): 40 mg via ORAL
  Filled 2015-09-23 (×5): qty 1

## 2015-09-23 MED ORDER — LINAGLIPTIN 5 MG PO TABS
5.0000 mg | ORAL_TABLET | Freq: Every day | ORAL | Status: DC
Start: 2015-09-23 — End: 2015-09-27
  Administered 2015-09-24 – 2015-09-27 (×4): 5 mg via ORAL
  Filled 2015-09-23 (×7): qty 1

## 2015-09-23 MED ORDER — MOMETASONE FURO-FORMOTEROL FUM 200-5 MCG/ACT IN AERO
2.0000 | INHALATION_SPRAY | Freq: Two times a day (BID) | RESPIRATORY_TRACT | Status: DC
  Administered 2015-09-24 – 2015-09-27 (×7): 2 via RESPIRATORY_TRACT
  Filled 2015-09-23 (×2): qty 8.8

## 2015-09-23 MED ORDER — FERROUS SULFATE 325 (65 FE) MG PO TABS
325.0000 mg | ORAL_TABLET | Freq: Every day | ORAL | Status: DC
  Administered 2015-09-24 – 2015-09-27 (×4): 325 mg via ORAL
  Filled 2015-09-23 (×5): qty 1

## 2015-09-23 MED ORDER — MONTELUKAST SODIUM 10 MG PO TABS
10.0000 mg | ORAL_TABLET | Freq: Every day | ORAL | Status: DC
  Administered 2015-09-23 – 2015-09-26 (×4): 10 mg via ORAL
  Filled 2015-09-23 (×5): qty 1

## 2015-09-23 MED ORDER — TIOTROPIUM BROMIDE MONOHYDRATE 18 MCG IN CAPS
18.0000 ug | ORAL_CAPSULE | Freq: Every day | RESPIRATORY_TRACT | Status: DC
  Administered 2015-09-24 – 2015-09-25 (×2): 18 ug via RESPIRATORY_TRACT
  Filled 2015-09-23: qty 5

## 2015-09-23 MED ORDER — NITROGLYCERIN 0.4 MG SL SUBL
0.4000 mg | SUBLINGUAL_TABLET | SUBLINGUAL | Status: DC | PRN
  Administered 2015-09-26 – 2015-09-27 (×3): 0.4 mg via SUBLINGUAL
  Filled 2015-09-23 (×3): qty 1

## 2015-09-23 MED ORDER — IOHEXOL 350 MG/ML SOLN
100.0000 mL | Freq: Once | INTRAVENOUS | Status: AC | PRN
  Administered 2015-09-23: 100 mL via INTRAVENOUS

## 2015-09-23 MED ORDER — ONDANSETRON HCL 4 MG/2ML IJ SOLN
4.0000 mg | Freq: Once | INTRAMUSCULAR | Status: AC
  Administered 2015-09-23: 4 mg via INTRAVENOUS
  Filled 2015-09-23: qty 2

## 2015-09-23 MED ORDER — SERTRALINE HCL 25 MG PO TABS
25.0000 mg | ORAL_TABLET | Freq: Every day | ORAL | Status: DC
  Administered 2015-09-24 – 2015-09-27 (×4): 25 mg via ORAL
  Filled 2015-09-23 (×5): qty 1

## 2015-09-23 MED ORDER — ZOLPIDEM TARTRATE 5 MG PO TABS: 10.0000 mg | ORAL_TABLET | Freq: Every evening | ORAL | Status: DC | PRN

## 2015-09-23 MED ORDER — CETYLPYRIDINIUM CHLORIDE 0.05 % MT LIQD
7.0000 mL | Freq: Two times a day (BID) | OROMUCOSAL | Status: DC
  Administered 2015-09-24 (×2): 7 mL via OROMUCOSAL

## 2015-09-23 MED ORDER — NITROGLYCERIN 0.4 MG SL SUBL
0.4000 mg | SUBLINGUAL_TABLET | SUBLINGUAL | Status: DC | PRN
  Administered 2015-09-23 (×2): 0.4 mg via SUBLINGUAL
  Filled 2015-09-23: qty 1

## 2015-09-23 MED ORDER — NITROGLYCERIN IN D5W 200-5 MCG/ML-% IV SOLN
10.0000 ug/min | INTRAVENOUS | Status: DC
  Administered 2015-09-23: 10 ug/min via INTRAVENOUS
  Administered 2015-09-23: 150 ug/min via INTRAVENOUS
  Administered 2015-09-23: 170 ug/min via INTRAVENOUS
  Administered 2015-09-24: 75 ug/min via INTRAVENOUS
  Filled 2015-09-23 (×4): qty 250

## 2015-09-23 MED ORDER — POTASSIUM CHLORIDE CRYS ER 10 MEQ PO TBCR
10.0000 meq | EXTENDED_RELEASE_TABLET | Freq: Every day | ORAL | Status: DC
  Administered 2015-09-23 – 2015-09-24 (×2): 10 meq via ORAL
  Filled 2015-09-23 (×2): qty 1

## 2015-09-23 MED ORDER — ATORVASTATIN CALCIUM 80 MG PO TABS
80.0000 mg | ORAL_TABLET | Freq: Every day | ORAL | Status: DC
  Administered 2015-09-23 – 2015-09-27 (×5): 80 mg via ORAL
  Filled 2015-09-23 (×5): qty 1

## 2015-09-23 MED ORDER — MAGNESIUM HYDROXIDE 400 MG/5ML PO SUSP
15.0000 mL | Freq: Every day | ORAL | Status: DC | PRN
  Administered 2015-09-24: 15 mL via ORAL
  Filled 2015-09-23 (×2): qty 30

## 2015-09-23 MED ORDER — RIVAROXABAN 20 MG PO TABS
20.0000 mg | ORAL_TABLET | Freq: Every day | ORAL | Status: DC
  Administered 2015-09-23 – 2015-09-27 (×5): 20 mg via ORAL
  Filled 2015-09-23 (×5): qty 1

## 2015-09-23 MED ORDER — LACTULOSE 10 GM/15ML PO SOLN
15.0000 g | Freq: Every day | ORAL | Status: DC
  Administered 2015-09-26: 15 g via ORAL
  Filled 2015-09-23 (×3): qty 30

## 2015-09-23 MED ORDER — FUROSEMIDE 10 MG/ML IJ SOLN
80.0000 mg | Freq: Two times a day (BID) | INTRAMUSCULAR | Status: DC
  Administered 2015-09-23 – 2015-09-25 (×5): 80 mg via INTRAVENOUS
  Filled 2015-09-23 (×5): qty 8

## 2015-09-23 MED ORDER — DOCUSATE SODIUM 100 MG PO CAPS
100.0000 mg | ORAL_CAPSULE | Freq: Two times a day (BID) | ORAL | Status: DC
  Administered 2015-09-23 – 2015-09-27 (×6): 100 mg via ORAL
  Filled 2015-09-23 (×6): qty 1

## 2015-09-23 MED ORDER — FENTANYL CITRATE (PF) 100 MCG/2ML IJ SOLN
50.0000 ug | Freq: Once | INTRAMUSCULAR | Status: AC
  Administered 2015-09-23: 50 ug via INTRAVENOUS
  Filled 2015-09-23: qty 2

## 2015-09-23 MED ORDER — CO Q 10 100 MG PO CAPS: 100.0000 mg | ORAL_CAPSULE | Freq: Every day | ORAL | Status: DC

## 2015-09-23 MED ORDER — CLOPIDOGREL BISULFATE 75 MG PO TABS
75.0000 mg | ORAL_TABLET | Freq: Every day | ORAL | Status: DC
  Administered 2015-09-23 – 2015-09-27 (×5): 75 mg via ORAL
  Filled 2015-09-23 (×5): qty 1

## 2015-09-23 MED ORDER — SALINE SPRAY 0.65 % NA SOLN
1.0000 | Freq: Four times a day (QID) | NASAL | Status: DC
  Administered 2015-09-24 – 2015-09-27 (×10): 1 via NASAL
  Filled 2015-09-23 (×3): qty 44

## 2015-09-23 MED ORDER — CHLORHEXIDINE GLUCONATE 0.12 % MT SOLN
15.0000 mL | Freq: Two times a day (BID) | OROMUCOSAL | Status: DC
  Administered 2015-09-23 – 2015-09-25 (×4): 15 mL via OROMUCOSAL
  Filled 2015-09-23 (×4): qty 15

## 2015-09-23 MED ORDER — OMEGA-3-ACID ETHYL ESTERS 1 G PO CAPS
1.0000 g | ORAL_CAPSULE | Freq: Every day | ORAL | Status: DC
Start: 2015-09-24 — End: 2015-09-27
  Administered 2015-09-23 – 2015-09-27 (×3): 1 g via ORAL
  Filled 2015-09-23 (×4): qty 1

## 2015-09-23 MED ORDER — HYDROCODONE-ACETAMINOPHEN 7.5-325 MG PO TABS
1.0000 | ORAL_TABLET | Freq: Four times a day (QID) | ORAL | Status: DC | PRN
  Administered 2015-09-24 – 2015-09-27 (×8): 1 via ORAL
  Filled 2015-09-23 (×8): qty 1

## 2015-09-23 MED ORDER — RANOLAZINE ER 500 MG PO TB12
1000.0000 mg | ORAL_TABLET | Freq: Two times a day (BID) | ORAL | Status: DC
  Administered 2015-09-23 – 2015-09-27 (×8): 1000 mg via ORAL
  Filled 2015-09-23 (×8): qty 2

## 2015-09-23 MED ORDER — ALBUTEROL SULFATE (2.5 MG/3ML) 0.083% IN NEBU: 2.5000 mg | INHALATION_SOLUTION | Freq: Four times a day (QID) | RESPIRATORY_TRACT | Status: DC | PRN

## 2015-09-23 MED ORDER — CHLORHEXIDINE GLUCONATE CLOTH 2 % EX PADS
6.0000 | MEDICATED_PAD | Freq: Every day | CUTANEOUS | Status: DC
  Administered 2015-09-24 – 2015-09-27 (×4): 6 via TOPICAL

## 2015-09-23 MED ORDER — ZOLPIDEM TARTRATE 5 MG PO TABS: 5.0000 mg | ORAL_TABLET | Freq: Every evening | ORAL | Status: DC | PRN

## 2015-09-23 MED ORDER — FUROSEMIDE 10 MG/ML IJ SOLN
80.0000 mg | Freq: Once | INTRAMUSCULAR | Status: AC
  Administered 2015-09-23: 80 mg via INTRAVENOUS
  Filled 2015-09-23: qty 8

## 2015-09-23 MED ORDER — FLUTICASONE PROPIONATE 50 MCG/ACT NA SUSP
1.0000 | Freq: Every day | NASAL | Status: DC
  Administered 2015-09-24 – 2015-09-27 (×4): 1 via NASAL
  Filled 2015-09-23 (×2): qty 16

## 2015-09-23 MED ORDER — ISOSORBIDE MONONITRATE ER 60 MG PO TB24: 120.0000 mg | ORAL_TABLET | Freq: Every day | ORAL | Status: DC

## 2015-09-23 MED ORDER — ASPIRIN 81 MG PO CHEW: 324.0000 mg | CHEWABLE_TABLET | Freq: Once | ORAL | Status: AC

## 2015-09-23 MED ORDER — HEPARIN BOLUS VIA INFUSION: 60.0000 [IU]/kg | Freq: Once | INTRAVENOUS | Status: DC

## 2015-09-23 MED ORDER — ENSURE ENLIVE PO LIQD
237.0000 mL | Freq: Two times a day (BID) | ORAL | Status: DC
  Administered 2015-09-25 – 2015-09-27 (×4): 237 mL via ORAL

## 2015-09-23 MED ORDER — HEPARIN (PORCINE) IN NACL 100-0.45 UNIT/ML-% IJ SOLN
900.0000 [IU]/h | INTRAMUSCULAR | Status: DC
  Administered 2015-09-23: 900 [IU]/h via INTRAVENOUS
  Filled 2015-09-23: qty 250

## 2015-09-23 MED ORDER — LOSARTAN POTASSIUM 50 MG PO TABS
50.0000 mg | ORAL_TABLET | Freq: Every day | ORAL | Status: DC
  Administered 2015-09-23 – 2015-09-27 (×5): 50 mg via ORAL
  Filled 2015-09-23 (×5): qty 1

## 2015-09-23 MED ORDER — POLYETHYLENE GLYCOL 3350 17 G PO PACK
17.0000 g | PACK | Freq: Every day | ORAL | Status: DC
  Administered 2015-09-23 – 2015-09-26 (×2): 17 g via ORAL
  Filled 2015-09-23 (×3): qty 1

## 2015-09-23 MED ORDER — ALBUTEROL SULFATE HFA 108 (90 BASE) MCG/ACT IN AERS: 2.0000 | INHALATION_SPRAY | Freq: Four times a day (QID) | RESPIRATORY_TRACT | Status: DC | PRN

## 2015-09-23 MED ORDER — ACETAMINOPHEN 325 MG PO TABS
650.0000 mg | ORAL_TABLET | ORAL | Status: DC | PRN
  Administered 2015-09-25 – 2015-09-26 (×2): 650 mg via ORAL
  Filled 2015-09-23 (×2): qty 2

## 2015-09-23 MED ORDER — ZINC OXIDE 20 % EX OINT
TOPICAL_OINTMENT | CUTANEOUS | Status: DC
  Filled 2015-09-23: qty 28.35

## 2015-09-23 MED ORDER — CARVEDILOL 25 MG PO TABS
37.5000 mg | ORAL_TABLET | Freq: Two times a day (BID) | ORAL | Status: DC
  Administered 2015-09-23 – 2015-09-27 (×8): 37.5 mg via ORAL
  Filled 2015-09-23 (×9): qty 1

## 2015-09-23 MED ORDER — HEPARIN (PORCINE) IN NACL 100-0.45 UNIT/ML-% IJ SOLN: 12.0000 [IU]/kg/h | INTRAMUSCULAR | Status: DC

## 2015-09-23 MED ORDER — MUPIROCIN 2 % EX OINT
1.0000 "application " | TOPICAL_OINTMENT | Freq: Two times a day (BID) | CUTANEOUS | Status: DC
  Administered 2015-09-23 – 2015-09-27 (×8): 1 via NASAL
  Filled 2015-09-23 (×5): qty 22

## 2015-09-23 MED ORDER — OMEGA-3 FATTY ACIDS 1000 MG PO CAPS: 2.0000 g | ORAL_CAPSULE | Freq: Every morning | ORAL | Status: DC

## 2015-09-23 NOTE — ED Notes (Signed)
Pt. Screaming out in pain when attempting to transport to Xray. RN changed rate on nitro to 4mcg and informed MD/cardiology. EDP advised changing nitro to 68mcg and ordered fentanyl. Pt. Still screaming out in pain and that he cannot breathe. Respiratory at bedside setting up for bipap at this time.

## 2015-09-23 NOTE — ED Notes (Signed)
Pt. Coming from [place] facility via [facility] c/o chest pain that started last  Night. Pt. sts pain in center chest/left chest that feels like a brick is laying on his chest. Pt. D/c yesterday for MRSA infection and external fixation to left lower leg. Pt. Reports strong cough and red mucus noted upon arrival. Pt. Feels like he cannot cough up what's in his chest. Pt. Appears diaphoretic and sts his chest pressure is 6/10. Pt. Given 324 ASA and 2 nitroglycerin en route with some relief. Pt. Extensive cardiac hx. Pt. Has no had his daily meds and is diabetic with CBG 155 per EMS. Pt.Aox4 and in no obvious distress at this time.

## 2015-09-23 NOTE — H&P (Signed)
ADMISSION HISTORY AND PHYSICAL   Date: 09/23/2015               Patient Name:  Henry [name] MRN: [id number]  DOB: 19-Nov-1940 Age / Sex: 75 y.o., male        PCP: Sherrie Mustache Primary Cardiologist: [place]         History of Present Illness: Patient is a 75 y.o. male with a PMHx of Inoperable coronary artery disease,  Congestive heart failure, hypertension, who was admitted to [facility] on 09/23/2015 for evaluation of florid pulmonary edema and respiratory failure.Marland Kitchen    Henry [name] is a 75 y.o. 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 Other hx is COPD and Lupus.    Admit 02/12-02/14 for chest pain, med rx recommended.  Admit 02/15-02/22 after admit for CVA, Cards saw for elevated troponin and chest pain, s/p cath 02/21, results below, med rx recommended. Cath showed severe calcified LCx stenosis, a very complex lesion. The RCA is occluded and fillsfrom left to right collaterals. LIMA-LAD is his only patent graft.  He was admitted again Feb. 23 for chest pain and congestive heart failure.  I reviewed his angiogram with multiple interventions list and his circumflex stenosis is not approachable by PCI.  Since that admission he's also had an admission at [facility] for his leg issues.  He is now admitted on March 11 with chest discomfort/pressure, congestive heart failure and respiratory failure. We had discussed hospice and palate care during his last admission. He still wanted to be intubated if needed for at least 2-3 days.  He was seen in the emergency room. ER doctor has placed him on BiPAP and has given him IV Lasix in an effort to stabilize him. He continues to have chest discomfort.     Medications: Outpatient medications:  (Not in a hospital admission)  Allergies  Allergen Reactions  . Ace Inhibitors Cough    Cough  . Gabapentin  Other (See Comments)    WAS BEHAVING VERY ODDLY     Past Medical History  Diagnosis Date  . Coronary atherosclerosis of native coronary artery     a. NSTEMI 2005 s/p CABG (LIMA - LAD, SVG - OM, SVG - PDA). b. DES to Cx 02/2005. c. NSTEMI 07/2010 due to occ Cx - med rx. Known occluded vein grafts.  . Essential hypertension, benign   . Type 2 diabetes mellitus ([place])   . Dyslipidemia   . ACE inhibitor intolerance   . Asthma   . Recurrent pulmonary embolism ([place])     a. 01/2011. b. Bilateral PE 11/2011 - + lupus anticoagulant preliminary testing. Felt to require lifelong Coumadin.  . Pulmonary nodule     RML by CT 11/2011, stable compared to 2012.  Marland Kitchen Cholelithiasis   . Pulmonary HTN ([place])     Echo 11/2011 at time of PE. PA pressure not assessed on 03/2012 echo but RV was back to normal.  . Lupus anticoagulant positive   . GERD (gastroesophageal reflux disease)   . COPD (chronic obstructive pulmonary disease) ([place])   . OSA (obstructive sleep apnea)     Past Surgical History  Procedure Laterality Date  . Coronary artery bypass graft  2005     LIMA to LAD, SVG to OM, SVG to RCA  . L4-l5 laminectomy    . Colonoscopy    .  Esophagogastroduodenoscopy N/A 12/19/2013    Procedure: ESOPHAGOGASTRODUODENOSCOPY (EGD);  Surgeon: Lafayette Dragon, MD;  Location: [facility] ENDOSCOPY;  Service: Endoscopy;  Laterality: N/A;  . Kidney stone removed    . Cardiac catheterization N/A 09/05/2015    Procedure: Left Heart Cath and Cors/Grafts Angiography;  Surgeon: Leonie Man, MD;  Location: [place] CV LAB;  Service: Cardiovascular;  Laterality: N/A;    Family History  Problem Relation Age of Onset  . Heart disease Father   . Heart disease Sister   . Breast cancer Sister   . Heart disease Brother     Social History:  reports that he quit smoking about 43 years ago. His smoking use included Cigarettes. He started smoking about 70 years ago. He has a 9 pack-year smoking history. He quit smokeless tobacco use  about 4 months ago. His smokeless tobacco use included Chew. He reports that he does not drink alcohol or use illicit drugs.   Review of Systems: Constitutional:  denies fever, chills, diaphoresis, appetite change and fatigue.  HEENT: denies photophobia, eye pain, redness, hearing loss, ear pain, congestion, sore throat, rhinorrhea, sneezing, neck pain, neck stiffness and tinnitus.  Respiratory: admits to SOB, DOE,    Cardiovascular: admits to chest pain, palpitations and leg swelling.  Gastrointestinal: denies nausea, vomiting, abdominal pain, diarrhea, constipation, blood in stool.  Genitourinary: denies dysuria, urgency, frequency, hematuria, flank pain and difficulty urinating.  Musculoskeletal: denies  myalgias, back pain, joint swelling, arthralgias and gait problem.   Skin: denies pallor, rash and wound.  Neurological: denies dizziness, seizures, syncope, weakness, light-headedness, numbness and headaches.   Hematological: denies adenopathy, easy bruising, personal or family bleeding history.  Psychiatric/ Behavioral: denies suicidal ideation, mood changes, confusion, nervousness, sleep disturbance and agitation.    Physical Exam: BP 186/128 mmHg  Pulse 147  Temp(Src) 98.8 F (37.1 C) (Oral)  Resp 38  Ht 5\' 8"  (1.727 m)  Wt 167 lb (75.751 kg)  BMI 25.40 kg/m2  SpO2 99%  Wt Readings from Last 3 Encounters:  09/23/15 167 lb (75.751 kg)  09/10/15 167 lb (75.751 kg)  09/06/15 174 lb 3.2 oz (79.017 kg)    General: Vital signs reviewed and noted. He is acutely ill. He has marked respiratory distress   Head: Normocephalic, atraumatic, sclera anicteric,   Neck: Supple. Negative for carotid bruits. J   Lungs:  Wheezes and rhonchi bilaterally.   Heart: RRR with S1 S2.He's tachycardic.   Abdomen:  Soft, non-tender, non-distended with normoactive bowel sounds. No hepatomegaly. No rebound/guarding. No obvious abdominal masses   MSK: Strength and the appear normal for age. The  external fixator devices are no longer on his right foot. Both legs are in casts    Extremities: No clubbing or cyanosis.   Neurologic: Alert and oriented X 3. Moves all extremities spontaneously   Psych:  normal     Lab results: Basic Metabolic Panel:  Recent Labs Lab 09/23/15 0906  NA 139  K 3.2*  CL 102  CO2 21*  GLUCOSE 159*  BUN 8  CREATININE 0.93  CALCIUM 9.0    Liver Function Tests: No results for input(s): AST, ALT, ALKPHOS, BILITOT, PROT, ALBUMIN in the last 168 hours. No results for input(s): LIPASE, AMYLASE in the last 168 hours.  CBC:  Recent Labs Lab 09/23/15 0906  WBC 11.9*  NEUTROABS 9.3*  HGB 9.3*  HCT 29.3*  MCV 80.9  PLT 505*    Cardiac Enzymes:  Recent Labs Lab 09/23/15 0906  TROPONINI 0.05*  BNP: Invalid input(s): POCBNP  CBG: No results for input(s): GLUCAP in the last 168 hours.  Coagulation Studies: No results for input(s): LABPROT, INR in the last 72 hours.   Other results: EKG - reviewed by me  Sinus tachycardia. He has significant ST segment depression in the lateral  leads.  Imaging: Ct Angio Chest Pe W/cm &/or Wo Cm  09/23/2015  CLINICAL DATA:  Chest pain. EXAM: CT ANGIOGRAPHY CHEST WITH CONTRAST TECHNIQUE: Multidetector CT imaging of the chest was performed using the standard protocol during bolus administration of intravenous contrast. Multiplanar CT image reconstructions and MIPs were obtained to evaluate the vascular anatomy. CONTRAST:  171mL OMNIPAQUE IOHEXOL 350 MG/ML SOLN COMPARISON:  Dec 08, 2011 FINDINGS: The trachea and mainstem bronchi are within normal limits. There is bronchial wall thickening more peripherally, particularly in the bases. No pneumothorax. Bilateral pulmonary opacities are identified, most prominent in the upper lobes but also seen in the lower lobes. The opacities are primarily ground-glass. There is also interlobular septal thickening identified in addition to bilateral small to moderate  pleural effusions. The pulmonary opacities limit evaluation of the underlying lung. No other infiltrates identified. No masses. Previously described pulmonary nodules are not well assessed due to the pulmonary opacities. No suspicious pulmonary nodule seen on this study. Opacities underlying the bilateral effusions are consistent with compressive atelectasis. The ascending thoracic aorta is borderline measuring 4.1 cm. There are coronary artery calcifications. The heart is unchanged. No pericardial effusion. Shotty nodes in the mediastinum. The largest in the subcarinal region are unchanged. These are probably reactive in nature. No pulmonary emboli. Cholelithiasis seen in the upper abdomen. Small hiatal hernia. Upper abdomen otherwise unremarkable. Visualized bones unremarkable. Review of the MIP images confirms the above findings. IMPRESSION: 1. No pulmonary emboli 2. Bilateral pulmonary opacities and interlobular septal thickening is most consistent with edema. Small to moderate bilateral pleural effusions. Electronically Signed   By: Dorise Bullion III M.D   On: 09/23/2015 10:57        Assessment & Plan:  1. Coronary artery disease. I know  Henry [name] from previous admissions. He has history of coronary artery disease and status post coronary artery bypass grafting. He has severe disease in his left circumflex and is right coronary artery is occluded and receives collateral filling from the circumflex proximal artery.  I have reviewed his angiogram with multiple interventions list and his circumflex coronary artery disease is not amenable to percutaneous treatment. He's not a candidate for redo coronary artery bypass grafting.  I've discussed this at great length with Henry [name] and his wife. We discussed hospice and positive care. At this point he still wants to remain a code and what would like to be intubated for at least 2-3 days if needed.   At this point we'll keep him on BiPAP and treated  with Lasix.   He's not a candidate for urgent cardiac catheterization.    Thayer Headings, Brooke Bonito., MD, [facility] 09/23/2015, 11:47 AM

## 2015-09-23 NOTE — ED Notes (Signed)
Cardiology at bedside.

## 2015-09-23 NOTE — Progress Notes (Signed)
PHARMACIST - PHYSICIAN ORDER COMMUNICATION  CONCERNING: P&T Medication Policy on Herbal Medications  DESCRIPTION:  This patient's order for:  Co Q 10  has been noted.  This product(s) is classified as an "herbal" or natural product. Due to a lack of definitive safety studies or FDA approval, nonstandard manufacturing practices, plus the potential risk of unknown drug-drug interactions while on inpatient medications, the Pharmacy and Therapeutics Committee does not permit the use of "herbal" or natural products of this type within [facility].   ACTION TAKEN: The pharmacy department is unable to verify this order at this time and your patient has been informed of this safety policy. Please reevaluate patient's clinical condition at discharge and address if the herbal or natural product(s) should be resumed at that time.   Dia Sitter, PharmD, BCPS 09/23/2015 3:36 PM

## 2015-09-23 NOTE — ED Provider Notes (Signed)
CSN: [id number]     Arrival date & time 09/23/15  [id number] History   First MD Initiated Contact with Patient 09/23/15 [phone]     Chief Complaint  Patient presents with  . Chest Pain  . Shortness of Breath     (Consider location/radiation/quality/duration/timing/severity/associated sxs/prior Treatment) Patient is a 75 y.o. male presenting with chest pain and shortness of breath.  Chest Pain Pain location:  Substernal area and L chest Pain quality: crushing and pressure   Pain radiates to:  Does not radiate Pain radiates to the back: no   Pain severity:  Mild Onset quality:  Sudden Duration:  14 hours Timing:  Constant Progression:  Waxing and waning Chronicity:  Recurrent Context: not breathing, no movement and not raising an arm   Relieved by:  None tried Worsened by:  Nothing tried Ineffective treatments:  None tried Associated symptoms: diaphoresis, heartburn, shortness of breath and weakness   Associated symptoms: no abdominal pain, no anxiety, no fatigue, no fever, no nausea, no numbness, no syncope and not vomiting   Shortness of Breath Associated symptoms: chest pain and diaphoresis   Associated symptoms: no abdominal pain, no fever, no rash, no syncope and no vomiting     Past Medical History  Diagnosis Date  . Coronary atherosclerosis of native coronary artery     a. NSTEMI 2005 s/p CABG (LIMA - LAD, SVG - OM, SVG - PDA). b. DES to Cx 02/2005. c. NSTEMI 07/2010 due to occ Cx - med rx. Known occluded vein grafts.  . Essential hypertension, benign   . Type 2 diabetes mellitus ([place])   . Dyslipidemia   . ACE inhibitor intolerance   . Asthma   . Recurrent pulmonary embolism ([place])     a. 01/2011. b. Bilateral PE 11/2011 - + lupus anticoagulant preliminary testing. Felt to require lifelong Coumadin.  . Pulmonary nodule     RML by CT 11/2011, stable compared to 2012.  Marland Kitchen Cholelithiasis   . Pulmonary HTN ([place])     Echo 11/2011 at time of PE. PA pressure not assessed on 03/2012 echo  but RV was back to normal.  . Lupus anticoagulant positive   . GERD (gastroesophageal reflux disease)   . COPD (chronic obstructive pulmonary disease) ([place])   . OSA (obstructive sleep apnea)    Past Surgical History  Procedure Laterality Date  . Coronary artery bypass graft  2005     LIMA to LAD, SVG to OM, SVG to RCA  . L4-l5 laminectomy    . Colonoscopy    . Esophagogastroduodenoscopy N/A 12/19/2013    Procedure: ESOPHAGOGASTRODUODENOSCOPY (EGD);  Surgeon: Lafayette Dragon, MD;  Location: [facility] ENDOSCOPY;  Service: Endoscopy;  Laterality: N/A;  . Kidney stone removed    . Cardiac catheterization N/A 09/05/2015    Procedure: Left Heart Cath and Cors/Grafts Angiography;  Surgeon: Leonie Man, MD;  Location: [place] CV LAB;  Service: Cardiovascular;  Laterality: N/A;   Family History  Problem Relation Age of Onset  . Heart disease Father   . Heart disease Sister   . Breast cancer Sister   . Heart disease Brother    Social History  Substance Use Topics  . Smoking status: Former Smoker -- 0.30 packs/day for 30 years    Types: Cigarettes    Start date: 09/28/1945    Quit date: 07/15/1972  . Smokeless tobacco: Former Systems developer    Types: [place] date: 05/16/2015     Comment: chews 1/2 pack tobacco  per day  . Alcohol Use: No    Review of Systems  Constitutional: Positive for diaphoresis. Negative for fever and fatigue.  Eyes: Negative for itching.  Respiratory: Positive for shortness of breath.   Cardiovascular: Positive for chest pain. Negative for syncope.  Gastrointestinal: Positive for heartburn. Negative for nausea, vomiting and abdominal pain.  Endocrine: Negative for polydipsia.  Skin: Negative for rash and wound.  Neurological: Positive for weakness. Negative for numbness.  All other systems reviewed and are negative.     Allergies  Ace inhibitors and Gabapentin  Home Medications   Prior to Admission medications   Medication Sig Start Date End Date Taking?  Authorizing Provider  acetaminophen (TYLENOL) 325 MG tablet Take 650 mg by mouth every 4 (four) hours as needed for mild pain. For pain   Yes Historical Provider, MD  albuterol (PROAIR HFA) 108 (90 BASE) MCG/ACT inhaler Inhale 2 puffs into the lungs every 6 (six) hours as needed for wheezing or shortness of breath.  02/11/14 02/11/16 Yes Historical Provider, MD  atorvastatin (LIPITOR) 80 MG tablet Take 1 tablet (80 mg total) by mouth daily. 02/09/15  Yes Geradine Girt, DO  budesonide-formoterol (SYMBICORT) 160-4.5 MCG/ACT inhaler Inhale 2 puffs into the lungs 2 (two) times daily.   Yes Historical Provider, MD  clopidogrel (PLAVIX) 75 MG tablet Take 1 tablet (75 mg total) by mouth daily. 09/06/15  Yes Oswald Hillock, MD  Coenzyme Q10 (CO Q 10) 100 MG CAPS Take 100 mg by mouth daily.   Yes Historical Provider, MD  docusate sodium (COLACE) 100 MG capsule Take 100 mg by mouth 2 (two) times daily.   Yes Historical Provider, MD  ferrous sulfate 325 (65 FE) MG tablet Take 325 mg by mouth daily with breakfast.   Yes Historical Provider, MD  fish oil-omega-3 fatty acids 1000 MG capsule Take 2 g by mouth every morning.    Yes Historical Provider, MD  fluticasone (FLONASE) 50 MCG/ACT nasal spray Place 1 spray into both nostrils daily.    Yes Historical Provider, MD  furosemide (LASIX) 20 MG tablet Take 20 mg by mouth daily.   Yes Historical Provider, MD  HYDROcodone-acetaminophen (NORCO) 7.5-325 MG tablet Take 1 tablet by mouth every 6 (six) hours as needed for moderate pain. 09/06/15  Yes Oswald Hillock, MD  isosorbide mononitrate (IMDUR) 120 MG 24 hr tablet Take 1 tablet by mouth daily.   Yes Historical Provider, MD  lactulose (CHRONULAC) 10 GM/15ML solution Take 15 g by mouth daily.    Yes Historical Provider, MD  losartan (COZAAR) 50 MG tablet Take 50 mg by mouth daily.   Yes Historical Provider, MD  montelukast (SINGULAIR) 10 MG tablet Take 10 mg by mouth at bedtime.   Yes Historical Provider, MD  nitroGLYCERIN  (NITROSTAT) 0.4 MG SL tablet Place 1 tablet (0.4 mg total) under the tongue every 5 (five) minutes x 3 doses as needed. For chest pain 04/05/14  Yes Satira Sark, MD  omeprazole (PRILOSEC) 40 MG capsule Take 40 mg by mouth every evening.   Yes Historical Provider, MD  polyethylene glycol (MIRALAX / GLYCOLAX) packet Take 17 g by mouth daily.   Yes Historical Provider, MD  potassium chloride (K-DUR,KLOR-CON) 10 MEQ tablet Take 10 mEq by mouth daily.   Yes Historical Provider, MD  ranolazine (RANEXA) 1000 MG SR tablet Take 1 tablet (1,000 mg total) by mouth 2 (two) times daily. 08/30/15  Yes Kelvin Cellar, MD  rivaroxaban (XARELTO) 20 MG TABS tablet Take  20 mg by mouth daily.   Yes Historical Provider, MD  sertraline (ZOLOFT) 25 MG tablet Take 1 tablet (25 mg total) by mouth daily. 08/30/15  Yes Kelvin Cellar, MD  sitaGLIPtin (JANUVIA) 25 MG tablet Take 1 tablet (25 mg total) by mouth daily. 02/09/15  Yes Geradine Girt, DO  sodium chloride (OCEAN) 0.65 % SOLN nasal spray Place 1 spray into both nostrils 4 (four) times daily.   Yes Historical Provider, MD  tiotropium (SPIRIVA) 18 MCG inhalation capsule Place 1 capsule (18 mcg total) into inhaler and inhale daily. 11/16/14  Yes Juanito Doom, MD  zolpidem (AMBIEN) 10 MG tablet Take 1 tablet (10 mg total) by mouth at bedtime as needed for sleep. 09/06/15  Yes Oswald Hillock, MD  carvedilol (COREG) 25 MG tablet Take 1.5 tablets (37.5 mg total) by mouth 2 (two) times daily with a meal. Patient not taking: Reported on 09/23/2015 09/10/15   Brett Canales, PA-C  magnesium hydroxide (MILK OF MAGNESIA) 400 MG/5ML suspension Take 15 mLs by mouth daily as needed for mild constipation.    Historical Provider, MD  zinc oxide 20 % ointment Apply topically every Monday. 09/06/15   Oswald Hillock, MD   BP 143/100 mmHg  Pulse 119  Temp(Src) 98.8 F (37.1 C) (Oral)  Resp 20  Ht 5\' 8"  (1.727 m)  Wt 167 lb (75.751 kg)  BMI 25.40 kg/m2  SpO2 100% Physical Exam   Constitutional: He appears well-developed and well-nourished. He appears distressed.  HENT:  Head: Normocephalic and atraumatic.  Neck: Normal range of motion.  Cardiovascular: Tachycardia present.   Pulmonary/Chest: Tachypnea noted. No respiratory distress. He has no decreased breath sounds. He has rhonchi. He has rales.  Abdominal: He exhibits no distension.  Musculoskeletal: Normal range of motion.  Neurological: He is alert.  Nursing note and vitals reviewed.   ED Course  Procedures (including critical care time)  CRITICAL CARE Performed by: Merrily Pew  Total critical care time: 45 minutes Critical care time was exclusive of separately billable procedures and treating other patients. Critical care was necessary to treat or prevent imminent or life-threatening deterioration. Critical care was time spent personally by me on the following activities: development of treatment plan with patient and/or surrogate as well as nursing, discussions with consultants, evaluation of patient's response to treatment, examination of patient, obtaining history from patient or surrogate, ordering and performing treatments and interventions, ordering and review of laboratory studies, ordering and review of radiographic studies, pulse oximetry and re-evaluation of patient's condition.  Labs Review Labs Reviewed  CBC WITH DIFFERENTIAL/PLATELET - Abnormal; Notable for the following:    WBC 11.9 (*)    RBC 3.62 (*)    Hemoglobin 9.3 (*)    HCT 29.3 (*)    MCH 25.7 (*)    RDW 17.4 (*)    Platelets 505 (*)    Neutro Abs 9.3 (*)    All other components within normal limits  BASIC METABOLIC PANEL - Abnormal; Notable for the following:    Potassium 3.2 (*)    CO2 21 (*)    Glucose, Bld 159 (*)    Anion gap 16 (*)    All other components within normal limits  TROPONIN I - Abnormal; Notable for the following:    Troponin I 0.05 (*)    All other components within normal limits  BRAIN NATRIURETIC  PEPTIDE - Abnormal; Notable for the following:    B Natriuretic Peptide 1089.3 (*)    All  other components within normal limits  APTT  I-STAT TROPOININ, ED    Imaging Review Ct Angio Chest Pe W/cm &/or Wo Cm  09/23/2015  CLINICAL DATA:  Chest pain. EXAM: CT ANGIOGRAPHY CHEST WITH CONTRAST TECHNIQUE: Multidetector CT imaging of the chest was performed using the standard protocol during bolus administration of intravenous contrast. Multiplanar CT image reconstructions and MIPs were obtained to evaluate the vascular anatomy. CONTRAST:  188mL OMNIPAQUE IOHEXOL 350 MG/ML SOLN COMPARISON:  Dec 08, 2011 FINDINGS: The trachea and mainstem bronchi are within normal limits. There is bronchial wall thickening more peripherally, particularly in the bases. No pneumothorax. Bilateral pulmonary opacities are identified, most prominent in the upper lobes but also seen in the lower lobes. The opacities are primarily ground-glass. There is also interlobular septal thickening identified in addition to bilateral small to moderate pleural effusions. The pulmonary opacities limit evaluation of the underlying lung. No other infiltrates identified. No masses. Previously described pulmonary nodules are not well assessed due to the pulmonary opacities. No suspicious pulmonary nodule seen on this study. Opacities underlying the bilateral effusions are consistent with compressive atelectasis. The ascending thoracic aorta is borderline measuring 4.1 cm. There are coronary artery calcifications. The heart is unchanged. No pericardial effusion. Shotty nodes in the mediastinum. The largest in the subcarinal region are unchanged. These are probably reactive in nature. No pulmonary emboli. Cholelithiasis seen in the upper abdomen. Small hiatal hernia. Upper abdomen otherwise unremarkable. Visualized bones unremarkable. Review of the MIP images confirms the above findings. IMPRESSION: 1. No pulmonary emboli 2. Bilateral pulmonary opacities  and interlobular septal thickening is most consistent with edema. Small to moderate bilateral pleural effusions. Electronically Signed   By: Dorise Bullion III M.D   On: 09/23/2015 10:57   I have personally reviewed and evaluated these images and lab results as part of my medical decision-making.   EKG Interpretation   Date/Time:  Saturday September 23 2015 08:49:55 EST Ventricular Rate:  103 PR Interval:  139 QRS Duration: 100 QT Interval:  354 QTC Calculation: 463 R Axis:   68 Text Interpretation:  Sinus tachycardia Probable left atrial enlargement  Nonspecific repol abnormality, lateral leads new ST depressions inferiorly  and  laterally from 2/24 Confirmed by [facility] MD, Corene Cornea [id number]) on  09/23/2015 9:04:00 AM      MDM   Final diagnoses:  Chest pain, unspecified chest pain type  Flash pulmonary edema ([place])   Does not appear well. The patient is diaphoretic and tachycardic. Concern for possible ACS with his known severe CAD, but with his recent hospitalization and surgery and would also consider pulmonary embolus and pneumonia. We'll evaluate for all at this time. We'll start by given nitroglycerin. Hes already had some aspirin. We'll reevaluate and likely admission to cardiology unless something comes up on his CT scan to offer alternative explanation.  ntg improved symptoms, started on a drip. Also started on heparin drip with concern for unstable angina in setting of severe disease.  After CT scan patient had a Q onset of worsening dyspnea and worsening chest pain. Reevaluation patient was to Neck, hypoxic tachycardic and hypertensive. His lungs had worsening crackles and wheezing consistent with likely acute pulmonary edema. His intervals and was increased to a final rate of 250 and heparin was continued. BiPAP was started and patient subsequently had better over the next 2-3 hours. Discussed case with cardiology who stated as no acute interventions for his heart disease, any likely  need hospice care for the same however they wouldn't  admit to stepdown and manage until patient was more stable.    Merrily Pew, MD 09/23/15 [phone]

## 2015-09-23 NOTE — Progress Notes (Signed)
ANTICOAGULATION CONSULT NOTE - Initial Consult  Pharmacy Consult for heparin Indication: chest pain/ACS  Allergies  Allergen Reactions  . Ace Inhibitors Cough    Cough  . Gabapentin Other (See Comments)    WAS BEHAVING VERY ODDLY    Patient Measurements: Height: 5\' 8"  (172.7 cm) Weight: 167 lb (75.751 kg) IBW/kg (Calculated) : 68.4 Heparin Dosing Weight:   Vital Signs: Temp: 98.8 F (37.1 C) (03/11 0857) Temp Source: Oral (03/11 0857) BP: 150/86 mmHg (03/11 0945) Pulse Rate: 106 (03/11 0945)  Labs:  Recent Labs  09/23/15 0906  HGB 9.3*  HCT 29.3*  PLT 505*  CREATININE 0.93  TROPONINI 0.05*    Estimated Creatinine Clearance: 66.4 mL/min (by C-G formula based on Cr of 0.93).   Medical History: Past Medical History  Diagnosis Date  . Coronary atherosclerosis of native coronary artery     a. NSTEMI 2005 s/p CABG (LIMA - LAD, SVG - OM, SVG - PDA). b. DES to Cx 02/2005. c. NSTEMI 07/2010 due to occ Cx - med rx. Known occluded vein grafts.  . Essential hypertension, benign   . Type 2 diabetes mellitus ([place])   . Dyslipidemia   . ACE inhibitor intolerance   . Asthma   . Recurrent pulmonary embolism ([place])     a. 01/2011. b. Bilateral PE 11/2011 - + lupus anticoagulant preliminary testing. Felt to require lifelong Coumadin.  . Pulmonary nodule     RML by CT 11/2011, stable compared to 2012.  Marland Kitchen Cholelithiasis   . Pulmonary HTN ([place])     Echo 11/2011 at time of PE. PA pressure not assessed on 03/2012 echo but RV was back to normal.  . Lupus anticoagulant positive   . GERD (gastroesophageal reflux disease)   . COPD (chronic obstructive pulmonary disease) ([place])   . OSA (obstructive sleep apnea)     Medications:  Scheduled:    Assessment: 75yo male with known severe CAD presenting to ED with chest pain and SOB.  He is on Xarelto at home with last dose at 5PM last night, for hx recurrent PE & Lupus AC(+).  He also had a recent ruptured Achilles tendon with external  fixation device in place on RLE; planned to be removed this week at [facility].    Goal of Therapy:  Heparin level 0.3-0.7 units/ml  PTT 66-102 sec Monitor platelets by anticoagulation protocol: Yes   Plan:  Heparin 900 units/hr (12 units/kg/hr) with no bolus due to recent Xarelto APTT in 8hr Daily HL and APTT until Xarelto effect resolves Daily CBC Watch for s/s of bleeding  Gracy Bruins, PharmD Clinical Pharmacist [facility]

## 2015-09-24 DIAGNOSIS — I5033 Acute on chronic diastolic (congestive) heart failure: Secondary | ICD-10-CM

## 2015-09-24 DIAGNOSIS — I10 Essential (primary) hypertension: Secondary | ICD-10-CM

## 2015-09-24 LAB — CBC
HEMATOCRIT: 29 % — AB (ref 39.0–52.0)
HEMOGLOBIN: 8.7 g/dL — AB (ref 13.0–17.0)
MCH: 24 pg — ABNORMAL LOW (ref 26.0–34.0)
MCHC: 30 g/dL (ref 30.0–36.0)
MCV: 80.1 fL (ref 78.0–100.0)
Platelets: 571 10*3/uL — ABNORMAL HIGH (ref 150–400)
RBC: 3.62 MIL/uL — AB (ref 4.22–5.81)
RDW: 17.8 % — ABNORMAL HIGH (ref 11.5–15.5)
WBC: 15 10*3/uL — AB (ref 4.0–10.5)

## 2015-09-24 LAB — GLUCOSE, CAPILLARY
GLUCOSE-CAPILLARY: 173 mg/dL — AB (ref 65–99)
Glucose-Capillary: 162 mg/dL — ABNORMAL HIGH (ref 65–99)
Glucose-Capillary: 172 mg/dL — ABNORMAL HIGH (ref 65–99)
Glucose-Capillary: 154 mg/dL — ABNORMAL HIGH (ref 65–99)

## 2015-09-24 MED ORDER — ISOSORBIDE MONONITRATE ER 60 MG PO TB24
120.0000 mg | ORAL_TABLET | Freq: Every day | ORAL | Status: DC
  Administered 2015-09-24 – 2015-09-27 (×4): 120 mg via ORAL
  Filled 2015-09-24 (×4): qty 2

## 2015-09-24 MED ORDER — ONDANSETRON HCL 4 MG/2ML IJ SOLN
4.0000 mg | Freq: Four times a day (QID) | INTRAMUSCULAR | Status: DC | PRN
  Administered 2015-09-24: 4 mg via INTRAVENOUS
  Filled 2015-09-24: qty 2

## 2015-09-24 MED ORDER — ALPRAZOLAM 0.25 MG PO TABS
0.2500 mg | ORAL_TABLET | Freq: Two times a day (BID) | ORAL | Status: DC | PRN
  Administered 2015-09-24 – 2015-09-27 (×5): 0.25 mg via ORAL
  Filled 2015-09-24 (×5): qty 1

## 2015-09-24 MED ORDER — POTASSIUM CHLORIDE CRYS ER 20 MEQ PO TBCR
40.0000 meq | EXTENDED_RELEASE_TABLET | Freq: Once | ORAL | Status: AC
  Administered 2015-09-24: 40 meq via ORAL
  Filled 2015-09-24: qty 2

## 2015-09-24 MED ORDER — POTASSIUM CHLORIDE CRYS ER 20 MEQ PO TBCR
20.0000 meq | EXTENDED_RELEASE_TABLET | Freq: Two times a day (BID) | ORAL | Status: DC
Start: 2015-09-24 — End: 2015-09-27
  Administered 2015-09-24 – 2015-09-27 (×6): 20 meq via ORAL
  Filled 2015-09-24 (×6): qty 1

## 2015-09-24 MED ORDER — ALUM & MAG HYDROXIDE-SIMETH 200-200-20 MG/5ML PO SUSP
30.0000 mL | ORAL | Status: DC | PRN
  Administered 2015-09-24: 30 mL via ORAL
  Filled 2015-09-24: qty 30

## 2015-09-24 NOTE — Significant Event (Signed)
Notified Dr. Berneice Gandy patient c/o that he is nauseated.  Resp Rate increased to 40's, Rhonchi auscultated to bil. Lungs.  Patient coughing up thick tan blood tinged sputum.  C/o mid sternal chest pain of 7/10 on pain scale.  Patient currently on Nitro gtt.  Zofran and Lasix ordered and given.  Will continue to monitor closely.

## 2015-09-24 NOTE — Progress Notes (Signed)
PROGRESS NOTE  Subjective:   Pt was admitted with angina and CHF yesterday  Troponins appear to be chronically elevated    Not a typical pattern for ACS  Objective:    Vital Signs:   Temp:  [98.3 F (36.8 C)-98.7 F (37.1 C)] 98.6 F (37 C) (03/12 0700) Pulse Rate:  [88-160] 88 (03/12 1000) Resp:  [15-43] 17 (03/12 1000) BP: (101-198)/(63-130) 107/65 mmHg (03/12 1000) SpO2:  [91 %-100 %] 95 % (03/12 1000) FiO2 (%):  [50 %-80 %] 50 % (03/11 2300) Weight:  [162 lb 7.7 oz (73.7 kg)] 162 lb 7.7 oz (73.7 kg) (03/11 1530)  Last BM Date: 09/24/15   24-hour weight change: Weight change:   Weight trends: Filed Weights   09/23/15 0857 09/23/15 1530  Weight: 167 lb (75.751 kg) 162 lb 7.7 oz (73.7 kg)    Intake/Output:  03/11 0701 - 03/12 0700 In: 845.3 [P.O.:80; I.V.:765.3] Out: [id number] [Urine:1775] Total I/O In: 88.8 [I.V.:88.8] Out: 250 [Urine:250]   Physical Exam: BP 107/65 mmHg  Pulse 88  Temp(Src) 98.6 F (37 C) (Oral)  Resp 17  Ht 5\' 8"  (1.727 m)  Wt 162 lb 7.7 oz (73.7 kg)  BMI 24.71 kg/m2  SpO2 95%  Wt Readings from Last 3 Encounters:  09/23/15 162 lb 7.7 oz (73.7 kg)  09/10/15 167 lb (75.751 kg)  09/06/15 174 lb 3.2 oz (79.017 kg)    General: Vital signs reviewed and noted. Doing much better today   Head: Normocephalic, atraumatic.  Eyes: conjunctivae/corneas clear.  EOM's intact.   Throat: normal  Neck:    Lungs:    clesar   Heart:  RR   Abdomen:  Soft, non-tender, non-distended    Extremities: Bilateral casts    Neurologic: A&O X3, CN II - XII are grossly intact.   Psych: Normal     Labs: BMET:  Recent Labs  09/23/15 0906  NA 139  K 3.2*  CL 102  CO2 21*  GLUCOSE 159*  BUN 8  CREATININE 0.93  CALCIUM 9.0    Liver function tests: No results for input(s): AST, ALT, ALKPHOS, BILITOT, PROT, ALBUMIN in the last 72 hours. No results for input(s): LIPASE, AMYLASE in the last 72 hours.  CBC:  Recent Labs  09/23/15 0906  09/24/15 0438  WBC 11.9* 15.0*  NEUTROABS 9.3*  --   HGB 9.3* 8.7*  HCT 29.3* 29.0*  MCV 80.9 80.1  PLT 505* 571*    Cardiac Enzymes:  Recent Labs  09/23/15 0906  TROPONINI 0.05*    Coagulation Studies: No results for input(s): LABPROT, INR in the last 72 hours.  Other: Invalid input(s): POCBNP No results for input(s): DDIMER in the last 72 hours. No results for input(s): HGBA1C in the last 72 hours. No results for input(s): CHOL, HDL, LDLCALC, TRIG, CHOLHDL in the last 72 hours. No results for input(s): TSH, T4TOTAL, T3FREE, THYROIDAB in the last 72 hours.  Invalid input(s): FREET3 No results for input(s): VITAMINB12, FOLATE, FERRITIN, TIBC, IRON, RETICCTPCT in the last 72 hours.   Other results: EKG  ( personally reviewed )  -  Sinus tach, mild  ST depression   Medications:    Infusions: . nitroGLYCERIN 40 mcg/min (09/24/15 0835)    Scheduled Medications: . antiseptic oral rinse  7 mL Mouth Rinse q12n4p  . atorvastatin  80 mg Oral Daily  . carvedilol  37.5 mg Oral BID WC  . chlorhexidine  15 mL Mouth Rinse BID  . Chlorhexidine Gluconate  Cloth  6 each Topical [id number]  . clopidogrel  75 mg Oral Daily  . docusate sodium  100 mg Oral BID  . feeding supplement (ENSURE ENLIVE)  237 mL Oral BID BM  . ferrous sulfate  325 mg Oral Q breakfast  . fluticasone  1 spray Each Nare Daily  . furosemide  80 mg Intravenous Q12H  . lactulose  15 g Oral Daily  . linagliptin  5 mg Oral Daily  . losartan  50 mg Oral Daily  . mometasone-formoterol  2 puff Inhalation BID  . montelukast  10 mg Oral QHS  . mupirocin ointment  1 application Nasal BID  . omega-3 acid ethyl esters  1 g Oral Daily  . pantoprazole  40 mg Oral Daily  . polyethylene glycol  17 g Oral Daily  . potassium chloride  10 mEq Oral Daily  . ranolazine  1,000 mg Oral BID  . rivaroxaban  20 mg Oral Q supper  . sertraline  25 mg Oral Daily  . sodium chloride  1 spray Each Nare QID  . tiotropium  18 mcg  Inhalation Daily  . [START ON 09/25/2015] zinc oxide   Topical Q Mon    Assessment/ Plan:   Active Problems:   NSTEMI (non-ST elevated myocardial infarction) ([place])  1. CAD :   Troponin curve is flat.   Not suggestive of ACs.   I suspect he has chronic ischemia which is exacerbated when he gets fluid overloaded  Will DC NTG drip.   Start back on Imdur 120  Consider transfer to tele tomorrow if he remains stable  2. Acute on chronic diastolic chf Better after IV lasix Will increase his scheduled diuretic.  3. DM  4.  Essential HTN  - BP is well controlled.     Disposition: keep in step down 1 more day   Length of Stay: 1  Thayer Headings, Brooke Bonito., MD, [facility] 09/24/2015, 10:37 AM Office [phone] Pager [phone]

## 2015-09-24 NOTE — Progress Notes (Signed)
Utilization review completed.  

## 2015-09-24 NOTE — Significant Event (Signed)
Called to patients room for c/o "heartburn" patient states that he "feels like he needs to cough something up"  Patient encouraged to cough and take deep breaths, sputum large thick tan/bloody mucus.  Milk of mag given for heartburn with some relief.  Patient on Nasal canula at this time,BP 128/77 mmHg  Pulse 111  Temp(Src) 98.3 F (36.8 C) (Oral)  Resp 21  Ht 5\' 8"  (1.727 m)  Wt 73.7 kg (162 lb 7.7 oz)  BMI 24.71 kg/m2  SpO2 98% Will continue to monitor closely.

## 2015-09-24 NOTE — Progress Notes (Signed)
PROGRESS NOTE  Subjective:   Pt was admitted with angina and CHF yesterday  Troponins appear to be chronically elevated    Not a typical pattern for ACS  Objective:    Vital Signs:   Temp:  [98.3 F (36.8 C)-98.7 F (37.1 C)] 98.6 F (37 C) (03/12 0700) Pulse Rate:  [88-160] 88 (03/12 1000) Resp:  [15-43] 17 (03/12 1000) BP: (101-198)/(63-130) 107/65 mmHg (03/12 1000) SpO2:  [91 %-100 %] 95 % (03/12 1000) FiO2 (%):  [50 %-80 %] 50 % (03/11 2300) Weight:  [162 lb 7.7 oz (73.7 kg)] 162 lb 7.7 oz (73.7 kg) (03/11 1530)  Last BM Date: 09/24/15   24-hour weight change: Weight change:   Weight trends: Filed Weights   09/23/15 0857 09/23/15 1530  Weight: 167 lb (75.751 kg) 162 lb 7.7 oz (73.7 kg)    Intake/Output:  03/11 0701 - 03/12 0700 In: 845.3 [P.O.:80; I.V.:765.3] Out: [id number] [Urine:1775] Total I/O In: 88.8 [I.V.:88.8] Out: 250 [Urine:250]   Physical Exam: BP 107/65 mmHg  Pulse 88  Temp(Src) 98.6 F (37 C) (Oral)  Resp 17  Ht 5\' 8"  (1.727 m)  Wt 162 lb 7.7 oz (73.7 kg)  BMI 24.71 kg/m2  SpO2 95%  Wt Readings from Last 3 Encounters:  09/23/15 162 lb 7.7 oz (73.7 kg)  09/10/15 167 lb (75.751 kg)  09/06/15 174 lb 3.2 oz (79.017 kg)    General: Vital signs reviewed and noted. Doing much better today   Head: Normocephalic, atraumatic.  Eyes: conjunctivae/corneas clear.  EOM's intact.   Throat: normal  Neck:    Lungs:    clesar   Heart:  RR   Abdomen:  Soft, non-tender, non-distended    Extremities: Bilateral casts    Neurologic: A&O X3, CN II - XII are grossly intact.   Psych: Normal     Labs: BMET:  Recent Labs  09/23/15 0906  NA 139  K 3.2*  CL 102  CO2 21*  GLUCOSE 159*  BUN 8  CREATININE 0.93  CALCIUM 9.0    Liver function tests: No results for input(s): AST, ALT, ALKPHOS, BILITOT, PROT, ALBUMIN in the last 72 hours. No results for input(s): LIPASE, AMYLASE in the last 72 hours.  CBC:  Recent Labs  09/23/15 0906  09/24/15 0438  WBC 11.9* 15.0*  NEUTROABS 9.3*  --   HGB 9.3* 8.7*  HCT 29.3* 29.0*  MCV 80.9 80.1  PLT 505* 571*    Cardiac Enzymes:  Recent Labs  09/23/15 0906  TROPONINI 0.05*    Coagulation Studies: No results for input(s): LABPROT, INR in the last 72 hours.  Other: Invalid input(s): POCBNP No results for input(s): DDIMER in the last 72 hours. No results for input(s): HGBA1C in the last 72 hours. No results for input(s): CHOL, HDL, LDLCALC, TRIG, CHOLHDL in the last 72 hours. No results for input(s): TSH, T4TOTAL, T3FREE, THYROIDAB in the last 72 hours.  Invalid input(s): FREET3 No results for input(s): VITAMINB12, FOLATE, FERRITIN, TIBC, IRON, RETICCTPCT in the last 72 hours.   Other results: EKG  ( personally reviewed )  -  Sinus tach, mild  ST depression   Medications:    Infusions:    Scheduled Medications: . antiseptic oral rinse  7 mL Mouth Rinse q12n4p  . atorvastatin  80 mg Oral Daily  . carvedilol  37.5 mg Oral BID WC  . chlorhexidine  15 mL Mouth Rinse BID  . Chlorhexidine Gluconate Cloth  6 each Topical Q0600  .  clopidogrel  75 mg Oral Daily  . docusate sodium  100 mg Oral BID  . feeding supplement (ENSURE ENLIVE)  237 mL Oral BID BM  . ferrous sulfate  325 mg Oral Q breakfast  . fluticasone  1 spray Each Nare Daily  . furosemide  80 mg Intravenous Q12H  . isosorbide mononitrate  120 mg Oral Daily  . lactulose  15 g Oral Daily  . linagliptin  5 mg Oral Daily  . losartan  50 mg Oral Daily  . mometasone-formoterol  2 puff Inhalation BID  . montelukast  10 mg Oral QHS  . mupirocin ointment  1 application Nasal BID  . omega-3 acid ethyl esters  1 g Oral Daily  . pantoprazole  40 mg Oral Daily  . polyethylene glycol  17 g Oral Daily  . potassium chloride  20 mEq Oral BID  . potassium chloride  40 mEq Oral Once  . ranolazine  1,000 mg Oral BID  . rivaroxaban  20 mg Oral Q supper  . sertraline  25 mg Oral Daily  . sodium chloride  1 spray  Each Nare QID  . tiotropium  18 mcg Inhalation Daily  . [START ON 09/25/2015] zinc oxide   Topical Q Mon    Assessment/ Plan:   Active Problems:   NSTEMI (non-ST elevated myocardial infarction) ([place])  1. CAD :   Troponin curve is flat.   Not suggestive of ACs.   I suspect he has chronic ischemia which is exacerbated when he gets fluid overloaded  Will DC NTG drip.   Start back on Imdur 120  Consider transfer to tele tomorrow if he remains stable  2. Acute on chronic diastolic chf Better after IV lasix Will increase his scheduled diuretic.  3. DM  4.  Essential HTN  - BP is well controlled.   5. hyplkalemia - will give 40 meq today and increase scheduled kdur to 20 BID   Disposition: keep in step down 1 more day   Length of Stay: 1  Thayer Headings, Brooke Bonito., MD, [facility] 09/24/2015, 10:50 AM Office [phone] Pager [phone]

## 2015-09-25 DIAGNOSIS — D62 Acute posthemorrhagic anemia: Secondary | ICD-10-CM

## 2015-09-25 LAB — BASIC METABOLIC PANEL
Anion gap: 12 (ref 5–15)
BUN: 23 mg/dL — ABNORMAL HIGH (ref 6–20)
CHLORIDE: 95 mmol/L — AB (ref 101–111)
CO2: 28 mmol/L (ref 22–32)
CREATININE: 1.37 mg/dL — AB (ref 0.61–1.24)
Calcium: 8.6 mg/dL — ABNORMAL LOW (ref 8.9–10.3)
GFR calc non Af Amer: 49 mL/min — ABNORMAL LOW (ref >60)
GFR, EST AFRICAN AMERICAN: 57 mL/min — AB (ref >60)
Glucose, Bld: 182 mg/dL — ABNORMAL HIGH (ref 65–99)
POTASSIUM: 3 mmol/L — AB (ref 3.5–5.1)
Sodium: 135 mmol/L (ref 135–145)

## 2015-09-25 LAB — CBC
HCT: 26.4 % — ABNORMAL LOW (ref 39.0–52.0)
Hemoglobin: 7.8 g/dL — ABNORMAL LOW (ref 13.0–17.0)
MCH: 23.9 pg — ABNORMAL LOW (ref 26.0–34.0)
MCHC: 29.5 g/dL — ABNORMAL LOW (ref 30.0–36.0)
MCV: 81 fL (ref 78.0–100.0)
PLATELETS: 491 10*3/uL — AB (ref 150–400)
RBC: 3.26 MIL/uL — AB (ref 4.22–5.81)
RDW: 17.8 % — AB (ref 11.5–15.5)
WBC: 13.5 10*3/uL — AB (ref 4.0–10.5)

## 2015-09-25 MED ORDER — TORSEMIDE 20 MG PO TABS
20.0000 mg | ORAL_TABLET | Freq: Every day | ORAL | Status: DC
  Administered 2015-09-25 – 2015-09-27 (×3): 20 mg via ORAL
  Filled 2015-09-25 (×3): qty 1

## 2015-09-25 MED ORDER — ADULT MULTIVITAMIN W/MINERALS CH
1.0000 | ORAL_TABLET | Freq: Every day | ORAL | Status: DC
Start: 2015-09-25 — End: 2015-09-27
  Administered 2015-09-25 – 2015-09-27 (×3): 1 via ORAL
  Filled 2015-09-25 (×3): qty 1

## 2015-09-25 NOTE — Progress Notes (Signed)
PROGRESS NOTE  Subjective:   Pt was admitted with angina and CHF yesterday  Troponins appear to be chronically elevated    Not a typical pattern for ACS  Doing better today    Objective:    Vital Signs:   Temp:  [98 F (36.7 C)-99.2 F (37.3 C)] 98.7 F (37.1 C) (03/13 0314) Pulse Rate:  [75-91] 87 (03/13 0549) Resp:  [11-33] 16 (03/13 0549) BP: (85-118)/(49-68) 109/64 mmHg (03/13 0549) SpO2:  [95 %-100 %] 100 % (03/13 0549) Weight:  [153 lb 14.1 oz (69.8 kg)] 153 lb 14.1 oz (69.8 kg) (03/13 0219)  Last BM Date: 09/24/15   24-hour weight change: Weight change: -13 lb 1.9 oz (-5.951 kg)  Weight trends: Filed Weights   09/23/15 0857 09/23/15 1530 09/25/15 0219  Weight: 167 lb (75.751 kg) 162 lb 7.7 oz (73.7 kg) 153 lb 14.1 oz (69.8 kg)    Intake/Output:  03/12 0701 - 03/13 0700 In: 488 [P.O.:360; I.V.:128] Out: 925 [Urine:925]     Physical Exam: BP 109/64 mmHg  Pulse 87  Temp(Src) 98.7 F (37.1 C) (Oral)  Resp 16  Ht 5\' 8"  (1.727 m)  Wt 153 lb 14.1 oz (69.8 kg)  BMI 23.40 kg/m2  SpO2 100%  Wt Readings from Last 3 Encounters:  09/25/15 153 lb 14.1 oz (69.8 kg)  09/10/15 167 lb (75.751 kg)  09/06/15 174 lb 3.2 oz (79.017 kg)    General: Vital signs reviewed and noted. Doing much better today   Head: Normocephalic, atraumatic.  Eyes: conjunctivae/corneas clear.  EOM's intact.   Throat: normal  Neck:    Lungs:    clesar   Heart:  RR   Abdomen:  Soft, non-tender, non-distended    Extremities: Bilateral casts    Neurologic: A&O X3, CN II - XII are grossly intact.   Psych: Normal     Labs: BMET:  Recent Labs  09/23/15 0906  NA 139  K 3.2*  CL 102  CO2 21*  GLUCOSE 159*  BUN 8  CREATININE 0.93  CALCIUM 9.0    Liver function tests: No results for input(s): AST, ALT, ALKPHOS, BILITOT, PROT, ALBUMIN in the last 72 hours. No results for input(s): LIPASE, AMYLASE in the last 72 hours.  CBC:  Recent Labs  09/23/15 0906  09/24/15 0438 09/25/15 0245  WBC 11.9* 15.0* 13.5*  NEUTROABS 9.3*  --   --   HGB 9.3* 8.7* 7.8*  HCT 29.3* 29.0* 26.4*  MCV 80.9 80.1 81.0  PLT 505* 571* 491*    Cardiac Enzymes:  Recent Labs  09/23/15 0906  TROPONINI 0.05*    Coagulation Studies: No results for input(s): LABPROT, INR in the last 72 hours.  Other: Invalid input(s): POCBNP No results for input(s): DDIMER in the last 72 hours. No results for input(s): HGBA1C in the last 72 hours. No results for input(s): CHOL, HDL, LDLCALC, TRIG, CHOLHDL in the last 72 hours. No results for input(s): TSH, T4TOTAL, T3FREE, THYROIDAB in the last 72 hours.  Invalid input(s): FREET3 No results for input(s): VITAMINB12, FOLATE, FERRITIN, TIBC, IRON, RETICCTPCT in the last 72 hours.   Other results: EKG  ( personally reviewed )  -  Sinus tach, mild  ST depression   Medications:    Infusions:    Scheduled Medications: . antiseptic oral rinse  7 mL Mouth Rinse q12n4p  . atorvastatin  80 mg Oral Daily  . carvedilol  37.5 mg Oral BID WC  . chlorhexidine  15 mL  Mouth Rinse BID  . Chlorhexidine Gluconate Cloth  6 each Topical Q0600  . clopidogrel  75 mg Oral Daily  . docusate sodium  100 mg Oral BID  . feeding supplement (ENSURE ENLIVE)  237 mL Oral BID BM  . ferrous sulfate  325 mg Oral Q breakfast  . fluticasone  1 spray Each Nare Daily  . furosemide  80 mg Intravenous Q12H  . isosorbide mononitrate  120 mg Oral Daily  . lactulose  15 g Oral Daily  . linagliptin  5 mg Oral Daily  . losartan  50 mg Oral Daily  . mometasone-formoterol  2 puff Inhalation BID  . montelukast  10 mg Oral QHS  . mupirocin ointment  1 application Nasal BID  . omega-3 acid ethyl esters  1 g Oral Daily  . pantoprazole  40 mg Oral Daily  . polyethylene glycol  17 g Oral Daily  . potassium chloride  20 mEq Oral BID  . ranolazine  1,000 mg Oral BID  . rivaroxaban  20 mg Oral Q supper  . sertraline  25 mg Oral Daily  . sodium chloride  1  spray Each Nare QID  . tiotropium  18 mcg Inhalation Daily  . zinc oxide   Topical Q Mon    Assessment/ Plan:   Active Problems:   NSTEMI (non-ST elevated myocardial infarction) ([place])  1. CAD :   Troponin curve is flat.   Not suggestive of ACs.   I suspect he has chronic ischemia which is exacerbated when he gets fluid overloaded  Will DC NTG drip.   Start back on Imdur 120    transfer to tele  Will get a palliative care consult. The wife will be here later in the AM.    It would be beneficial for her to be present for the palliative care consult  2. Acute on chronic diastolic chf Better after IV lasix Will increase his scheduled diuretic.  3. DM  4.  Essential HTN  - BP is well controlled.   5. hyplkalemia -    6. Anemia:   Will guiac stools.   Disposition: transfer to tele. Palliative care consult   Length of Stay: 2  Ramond Dial., MD, [facility] 09/25/2015, 8:14 AM Office [phone] Pager [phone]

## 2015-09-25 NOTE — Clinical Social Work Note (Signed)
CSW received phone call from [facility] staying that patient is from SNF.  CSW to continue to follow patient's group.  Jones Broom. [place], MSW, [place] 09/25/2015 6:19 PM

## 2015-09-25 NOTE — Progress Notes (Signed)
Initial Nutrition Assessment  DOCUMENTATION CODES:   Severe malnutrition in context of chronic illness  INTERVENTION:   -Continue Ensure Enlive po BID, each supplement provides 350 kcal and 20 grams of protein -MVI daily  NUTRITION DIAGNOSIS:   Malnutrition related to chronic illness as evidenced by severe depletion of body fat, severe depletion of muscle mass.  GOAL:   Patient will meet greater than or equal to 90% of their needs  MONITOR:   PO intake, Supplement acceptance, Labs, Weight trends, Skin, I & O's  REASON FOR ASSESSMENT:   Malnutrition Screening Tool    ASSESSMENT:   Patient is a 75 y.o. male with a PMHx of Inoperable coronary artery disease, Congestive heart failure, hypertension, who was admitted to [facility] on 09/23/2015 for evaluation of florid pulmonary edema and respiratory failure..   Pt admitted with angina and CHF.   Hx obtained from pt and family at bedside. All confirm a general decline in health over the past 4-5 weeks. Pt reports that appetite varies day by day; wife confirms that pt will ear fairly well on some days, but others he will consume next to nothing. Lunch tray completion observed; pt consumed a few bites of beef stew and about 50% of fruit cup. He had consumed 75% of bottle of Ensure prior to RD arrival.   Pt reports UBW around 187#. He reveals he last weighed this about 4-5 weeks ago, however, not consistent with documented weight hx. Noted a 41# (21%) wt loss over the past 6 months, which is significant for time frame, however, wt changes difficult to assess due to hx of CHF and currently receiving diuretic therapy.   CWOCN note reviewed; pt with full thickness post-op wound on rt leg skin graft site and partial thickness stasis ulcers on lt leg.   Nutrition-Focused physical exam completed. Findings are moderate to severe fat depletion, moderate to severe muscle depletion, and no edema.   Discussed importance of good meal and supplement  intake to promote wound healing. Pt reports he recently started Ensure supplements during previous hospitalization and is amenable to continuing supplements.   Case discussed with RN. She confirms pt with very poor appetite, however, has been accepting Ensure supplements.   Palliative care team consult pending for goals of care.   Labs reviewed: K: 3.0. CBGS: 154-173.  Diet Order:  Diet heart healthy/carb modified Room service appropriate?: Yes; Fluid consistency:: Thin  Skin:  Wound (see comment) (partial thickness stasis ulcer lt leg, full thickness rt leg)  Last BM:  09/24/15  Height:   Ht Readings from Last 1 Encounters:  09/23/15 5\' 8"  (1.727 m)    Weight:   Wt Readings from Last 1 Encounters:  09/25/15 153 lb 14.1 oz (69.8 kg)    Ideal Body Weight:  70 kg  BMI:  Body mass index is 23.4 kg/(m^2).  Estimated Nutritional Needs:   Kcal:  2000-2200  Protein:  100-115 grams  Fluid:  2.0-2.2 L  EDUCATION NEEDS:   Education needs addressed  Beauregard Jarrells A. Jimmye Norman, RD, LDN, CDE Pager: [phone] After hours Pager: [phone]

## 2015-09-25 NOTE — Care Management Note (Signed)
Case Management Note  Patient Details  Name: Henry [name] MRN: [id number] Date of Birth: 07-25-1940  Subjective/Objective:         Adm w mi           Action/Plan: from [place], sw aware   Expected Discharge Date:                Expected Discharge Plan:  [place]  In-House Referral:  Clinical Social Work  Discharge planning Services     Post Acute Care Choice:    Choice offered to:     DME Arranged:    DME Agency:     HH Arranged:    [place] Agency:     Status of Service:     Medicare Important Message Given:    Date Medicare IM Given:    Medicare IM give by:    Date Additional Medicare IM Given:    Additional Medicare Important Message give by:     If discussed at [place] of Stay Meetings, dates discussed:    Additional Comments:sw alerted cm that pt from snf  Lacretia Leigh, RN 09/25/2015, 11:21 AM

## 2015-09-25 NOTE — Progress Notes (Signed)
Report given to Lauren RN at this time.  Pt has no c/o pain or s/s of any acute distress.  BP soft 90/51 but pt alert without s/s of hypotension.

## 2015-09-25 NOTE — Consult Note (Addendum)
WOC wound consult note Reason for Consult: Consult requested for bilat legs.  Left leg with patchy areas of partial thickness stasis ulcers; 2X1X.1cm, 1X1X.1cm, 3X2X.1cm, 13X.3X.1cm   Wound bed: All are scattered areas of red dry wound beds with some scabs surrounding Drainage (amount, consistency, odor) Scant amt tan drainage, no odor Dressing procedure/placement/frequency: Zinc ointment has been previously ordered to promote healing, and pt was having ABD pads, kerlex, and ace wrap applied prior to admission.  Continue present plan of care.  Pt states right leg has recently had a skin graft performed at [facility]; no records are available in the EMR regarding topical treatment.  Upon removing outer dressings; the graft site and full thickness post-op wound with staples are visible underneath a layer of Xeroform; usual plan of care is to leave in place and not disturb the cover dressing over the graft site. Full thickness post-op wound is approx 15X8cm and appears to be beefy red when visualized through intact dressing.  Changed ABD pads and kerlex and ace wrap which are soiled with large amt tan drainage, no odor.  Outer dressings can be changed 3 times a week, pt should resume follow up with his surgeon as soon as possible after discharge for follow up assessment dn plan of care. No family present. Please re-consult if further assistance is needed.  Thank-you,  Julien Girt MSN, [place], [place], [place], [place]

## 2015-09-26 DIAGNOSIS — Z515 Encounter for palliative care: Secondary | ICD-10-CM

## 2015-09-26 DIAGNOSIS — R079 Chest pain, unspecified: Secondary | ICD-10-CM

## 2015-09-26 DIAGNOSIS — Z7189 Other specified counseling: Secondary | ICD-10-CM

## 2015-09-26 LAB — BASIC METABOLIC PANEL
Anion gap: 14 (ref 5–15)
BUN: 28 mg/dL — AB (ref 6–20)
CHLORIDE: 93 mmol/L — AB (ref 101–111)
CO2: 29 mmol/L (ref 22–32)
Calcium: 8.5 mg/dL — ABNORMAL LOW (ref 8.9–10.3)
Creatinine, Ser: 1.3 mg/dL — ABNORMAL HIGH (ref 0.61–1.24)
GFR calc Af Amer: >60 mL/min (ref >60)
GFR, EST NON AFRICAN AMERICAN: 52 mL/min — AB (ref >60)
GLUCOSE: 128 mg/dL — AB (ref 65–99)
POTASSIUM: 3.3 mmol/L — AB (ref 3.5–5.1)
Sodium: 136 mmol/L (ref 135–145)

## 2015-09-26 LAB — CBC
HEMATOCRIT: 26.8 % — AB (ref 39.0–52.0)
Hemoglobin: 8.1 g/dL — ABNORMAL LOW (ref 13.0–17.0)
MCH: 24.4 pg — AB (ref 26.0–34.0)
MCHC: 30.2 g/dL (ref 30.0–36.0)
MCV: 80.7 fL (ref 78.0–100.0)
PLATELETS: 463 10*3/uL — AB (ref 150–400)
RBC: 3.32 MIL/uL — AB (ref 4.22–5.81)
RDW: 17.7 % — ABNORMAL HIGH (ref 11.5–15.5)
WBC: 11.5 10*3/uL — ABNORMAL HIGH (ref 4.0–10.5)

## 2015-09-26 NOTE — Clinical Documentation Improvement (Addendum)
Cardiology  (Query responses must be documented in the current medical record, not on the CDI BPA form. Thank you.)  Query 1 of 2   (please scroll down) "NSTEMI" is documented in the current medical record and entered on the current hospital problem list as POA and dated 09/23/15 by Dr. Acie Fredrickson   To assist with accurate code assignment, please clarify if the patient has or has not had a NSTEMI this admission.  Clinical information/Indicators: "NSTEMI (non-ST elevated myocardial infarction) ([place]) 1. CAD : Troponin curve is flat. Not suggestive of ACs. I suspect he has chronic ischemia which is exacerbated when he gets fluid overloaded  Will DC NTG drip. Start back on Imdur 120  Consider transfer to tele tomorrow if he remains stable  2. Acute on chronic diastolic chf  Better after IV lasix  Will increase his scheduled diuretic.  3. DM 4. Essential HTN - BP is well controlled" has been dictated in the daily progress notes since 09/24/14.  Troponins drawn this admission Component     Latest Ref Rng 09/23/2015  Troponin i, poc     0.00 - 0.08 ng/mL 0.06  Comment 3        Troponin I     <0.031 ng/mL 0.05 (H)   Query 2 of 2 "Respiratory Failure" is documented in the current medical record.  In order to provide greater specificity, please document the Acuity and Type of respiratory failure in the progress notes and discharge summary:  - Acute;  Acute on Chronic;  Chronic  - Hypoxic;  Hypercarbic;  Combined hypoxic and hypercapneic  - Other acuity and type  - Unable to clinically determine the acuity or type  Clinical Information/Indicators: "Patient is a 75 y.o. male with a PMHx of Inoperable coronary artery disease, Congestive heart failure, hypertension, who was admitted to [facility] on 09/23/2015 for evaluation of florid pulmonary edema and respiratory failure." documented in H&P "He is acutely ill. He has marked respiratory distress" is documented in H&P  Bipap was started and  continued over the next 2-3 hours per ED provider. Respiratory rate in ED ranged in the mid 20's to upper 30's per flowsheets O2 was started at 2 liters and was titrated up to 80% Bipap per flowsheets  Please exercise your independent, professional judgment when responding. A specific answer is not anticipated or expected.   Thank You, Erling Conte   RN BSN CCDS [phone] Health Information Management [place]

## 2015-09-26 NOTE — Consult Note (Signed)
Consultation Note Date: 09/26/2015   Patient Name: Henry [name]  DOB: Apr 08, 1941  MRN: [id number]  Age / Sex: 75 y.o., male  PCP: Dione Housekeeper, MD Referring Physician: Thayer Headings, MD  Reason for Consultation: Establishing goals of care  Clinical Assessment/Narrative: 75 y.o. year old male with a history of CAD, NSTEMI in 07/2010 with DES to the Cx, 3 v CABG 2005 w/ known graft disease, HTN, DM, recurrent PE on Xarelto, COPD, Lupus, .  Has been admitted with severe angina at rest multiple times in the last couple months. Has known critical CAD with stenosis not amenable to PCI.  Palliative consulted for goals of care discussions.  I met this morning with Henry [name] and his nephew. He reports the most important things to him are continuing to feel as well as possible for as long as possible, spending time with his family, and hopefully getting back to his home.  He reports that his physicians have been doing a good job speaking with him and he understands the critical nature of the situation. He states knowing that he has severe vessel disease that cannot be "fixed." We talked at length about his goals in light of this and he asked me to return in the afternoon when his wife was present.  I returned later on in the afternoon and spoke again with the patient and his wife. His wife is insistent that he is going to go to their home once he is discharged from the hospital. We talked about hospice and the support that they could provide as well as the fact they do not provide 24-hour round-the-clock care in the home. She states that this is something that they would work out between herself, family, and friends from church.  Contacts/Participants in Discussion: Patient, his nephew, and his wife. Primary Decision Maker: Patient   Relationship to Patient self POA: None on chart  SUMMARY OF RECOMMENDATIONS - Patient's  goal is to live as long as possible as well as possible. He wants to go back to his home on discharge.  We discussed hospice and he and his wife are in agreement that this aligns well with his goals moving forward. He would like to continue working with cardiology this admission to develop medication regimen that will allow him the best quality of life moving forward. He would then like to discharge home with hospice support. -  We discussed CODE STATUS at length. He and his wife are in agreement that aggressive measures at the end-of-life would not be a pathway for him being well enough to return to his home, which is his goal.  He is in agreement with changing CODE STATUS to DO NOT RESUSCITATE. I also completed a durable DO NOT RESUSCITATE and placed on his chart. - I placed a consult for care management to begin discussing options for home hospice as he is discharged. His wife expressed preference for hospice of [facility]. - Discussed with Kerin Ransom, [facility] (on call provider for his cardiology team).   Code Status/Advance Care Planning: DNR    Code Status Orders        Start     Ordered   09/26/15 1416  Do not attempt resuscitation (DNR)   Continuous    Question Answer Comment  In the event of cardiac or respiratory ARREST Do not call a "code blue"   In the event of cardiac or respiratory ARREST Do not perform Intubation, CPR, defibrillation or ACLS   In the  event of cardiac or respiratory ARREST Use medication by any route, position, wound care, and other measures to relive pain and suffering. May use oxygen, suction and manual treatment of airway obstruction as needed for comfort.      09/26/15 1428    Code Status History    Date Active Date Inactive Code Status Order ID Comments User Context   09/07/2015  7:51 PM 09/10/2015  5:00 PM Full Code [id number]  Lonn Georgia, PA-C ED   08/31/2015  1:03 AM 09/06/2015  9:23 PM Full Code [id number]  Vianne Bulls, MD Inpatient   08/27/2015  10:13 PM 08/30/2015  9:25 PM Full Code [id number]  Kelvin Cellar, MD Inpatient   02/05/2015  7:55 PM 02/09/2015  6:22 PM Full Code [id number]  Caren Griffins, MD ED   12/18/2013  1:45 AM 12/20/2013  4:51 PM Full Code [id number]  Merton Border, MD Inpatient   12/08/2011 12:06 PM 12/14/2011  7:32 PM Full Code [id number]  Ezra Sites, MD ED     Symptom Management:   Chest pain: Cardiology has been adjusting medications to maximize symptom relief in light of CAD and fluid overload. In addition to this, on discharge, would recommend scripts for: - Morphine Concentrate '10mg'$ /0.31m: '5mg'$  (0.241m sublingual every 1 hour as needed for pain or shortness of breath: Disp 3023m Lorazepam '2mg'$ /ml concentrated solution: '1mg'$  (0.5ml22mublingual every 4 hours as needed for anxiety: Disp 30ml37maldol '2mg'$ /ml solution: 0.'5mg'$  (0.25ml)43mlingual every 4 hours as needed for agitation or nausea: Disp 30ml  79mliative Prophylaxis:   Aspiration, Bowel Regimen and Frequent Pain Assessment  Additional Recommendations (Limitations, Scope, Preferences):  Avoid Hospitalization  Psycho-social/Spiritual:  Support System: Strong Additional Recommendations: Education on Hospice: I left copy of [facility] for their review.  Prognosis: Less than 6 months if his disease follows its natural course  Discharge Planning: Home with Hospice   Chief Complaint/ Primary Diagnoses: Present on Admission:  . NSTEMI (non-ST elevated myocardial infarction) (HCC)  IBruleve reviewed the medical record, interviewed the patient and family, and examined the patient. The following aspects are pertinent.  Past Medical History  Diagnosis Date  . Coronary atherosclerosis of native coronary artery     a. NSTEMI 2005 s/p CABG (LIMA - LAD, SVG - OM, SVG - PDA). b. DES to Cx 02/2005. c. NSTEMI 07/2010 due to occ Cx - med rx. Known occluded vein grafts.  . Essential hypertension, benign   . Type 2 diabetes mellitus (HCC)   [place]yslipidemia   . ACE inhibitor intolerance   . Asthma   . Recurrent pulmonary embolism (HCC)   [place]. 01/2011. b. Bilateral PE 11/2011 - + lupus anticoagulant preliminary testing. Felt to require lifelong Coumadin.  . Pulmonary nodule     RML by CT 11/2011, stable compared to 2012.  . CholeMarland Kitchenithiasis   . Pulmonary HTN (HCC)   [place] 11/2011 at time of PE. PA pressure not assessed on 03/2012 echo but RV was back to normal.  . Lupus anticoagulant positive   . GERD (gastroesophageal reflux disease)   . COPD (chronic obstructive pulmonary disease) (HCC)   RomneySA (obstructive sleep apnea)    Social History   Social History  . Marital Status: Married    Spouse Name: N/A  . Number of Children: N/A  . Years of Education: N/A   Occupational History  . Retired    Social History Main Topics  .  Smoking status: Former Smoker -- 0.30 packs/day for 30 years    Types: Cigarettes    Start date: 09/28/1945    Quit date: 07/15/1972  . Smokeless tobacco: Former Systems developer    Types: Chew    Quit date: 05/16/2015     Comment: chews 1/2 pack tobacco per day  . Alcohol Use: No  . Drug Use: No  . Sexual Activity: Not Asked   Other Topics Concern  . None   Social History Narrative   Married, retired.    Family History  Problem Relation Age of Onset  . Heart disease Father   . Heart disease Sister   . Breast cancer Sister   . Heart disease Brother    Scheduled Meds: . atorvastatin  80 mg Oral Daily  . carvedilol  37.5 mg Oral BID WC  . Chlorhexidine Gluconate Cloth  6 each Topical Q0600  . clopidogrel  75 mg Oral Daily  . docusate sodium  100 mg Oral BID  . feeding supplement (ENSURE ENLIVE)  237 mL Oral BID BM  . ferrous sulfate  325 mg Oral Q breakfast  . fluticasone  1 spray Each Nare Daily  . isosorbide mononitrate  120 mg Oral Daily  . lactulose  15 g Oral Daily  . linagliptin  5 mg Oral Daily  . losartan  50 mg Oral Daily  . mometasone-formoterol  2 puff Inhalation BID  . montelukast   10 mg Oral QHS  . multivitamin with minerals  1 tablet Oral Daily  . mupirocin ointment  1 application Nasal BID  . omega-3 acid ethyl esters  1 g Oral Daily  . pantoprazole  40 mg Oral Daily  . polyethylene glycol  17 g Oral Daily  . potassium chloride  20 mEq Oral BID  . ranolazine  1,000 mg Oral BID  . rivaroxaban  20 mg Oral Q supper  . sertraline  25 mg Oral Daily  . sodium chloride  1 spray Each Nare QID  . tiotropium  18 mcg Inhalation Daily  . torsemide  20 mg Oral Daily  . zinc oxide   Topical Q Mon   Continuous Infusions:  PRN Meds:.acetaminophen, albuterol, ALPRAZolam, alum & mag hydroxide-simeth, HYDROcodone-acetaminophen, magnesium hydroxide, nitroGLYCERIN, ondansetron (ZOFRAN) IV, zolpidem Medications Prior to Admission:  Prior to Admission medications   Medication Sig Start Date End Date Taking? Authorizing Provider  acetaminophen (TYLENOL) 325 MG tablet Take 650 mg by mouth every 4 (four) hours as needed for mild pain. For pain   Yes Historical Provider, MD  albuterol (PROAIR HFA) 108 (90 BASE) MCG/ACT inhaler Inhale 2 puffs into the lungs every 6 (six) hours as needed for wheezing or shortness of breath.  02/11/14 02/11/16 Yes Historical Provider, MD  atorvastatin (LIPITOR) 80 MG tablet Take 1 tablet (80 mg total) by mouth daily. 02/09/15  Yes Geradine Girt, DO  budesonide-formoterol (SYMBICORT) 160-4.5 MCG/ACT inhaler Inhale 2 puffs into the lungs 2 (two) times daily.   Yes Historical Provider, MD  clopidogrel (PLAVIX) 75 MG tablet Take 1 tablet (75 mg total) by mouth daily. 09/06/15  Yes Oswald Hillock, MD  Coenzyme Q10 (CO Q 10) 100 MG CAPS Take 100 mg by mouth daily.   Yes Historical Provider, MD  docusate sodium (COLACE) 100 MG capsule Take 100 mg by mouth 2 (two) times daily.   Yes Historical Provider, MD  ferrous sulfate 325 (65 FE) MG tablet Take 325 mg by mouth daily with breakfast.   Yes Historical Provider,  MD  fish oil-omega-3 fatty acids 1000 MG capsule Take 2 g  by mouth every morning.    Yes Historical Provider, MD  fluticasone (FLONASE) 50 MCG/ACT nasal spray Place 1 spray into both nostrils daily.    Yes Historical Provider, MD  furosemide (LASIX) 20 MG tablet Take 20 mg by mouth daily.   Yes Historical Provider, MD  HYDROcodone-acetaminophen (NORCO) 7.5-325 MG tablet Take 1 tablet by mouth every 6 (six) hours as needed for moderate pain. 09/06/15  Yes Oswald Hillock, MD  isosorbide mononitrate (IMDUR) 120 MG 24 hr tablet Take 1 tablet by mouth daily.   Yes Historical Provider, MD  lactulose (CHRONULAC) 10 GM/15ML solution Take 15 g by mouth daily.    Yes Historical Provider, MD  losartan (COZAAR) 50 MG tablet Take 50 mg by mouth daily.   Yes Historical Provider, MD  montelukast (SINGULAIR) 10 MG tablet Take 10 mg by mouth at bedtime.   Yes Historical Provider, MD  nitroGLYCERIN (NITROSTAT) 0.4 MG SL tablet Place 1 tablet (0.4 mg total) under the tongue every 5 (five) minutes x 3 doses as needed. For chest pain 04/05/14  Yes Satira Sark, MD  omeprazole (PRILOSEC) 40 MG capsule Take 40 mg by mouth every evening.   Yes Historical Provider, MD  polyethylene glycol (MIRALAX / GLYCOLAX) packet Take 17 g by mouth daily.   Yes Historical Provider, MD  potassium chloride (K-DUR,KLOR-CON) 10 MEQ tablet Take 10 mEq by mouth daily.   Yes Historical Provider, MD  ranolazine (RANEXA) 1000 MG SR tablet Take 1 tablet (1,000 mg total) by mouth 2 (two) times daily. 08/30/15  Yes Kelvin Cellar, MD  rivaroxaban (XARELTO) 20 MG TABS tablet Take 20 mg by mouth daily.   Yes Historical Provider, MD  sertraline (ZOLOFT) 25 MG tablet Take 1 tablet (25 mg total) by mouth daily. 08/30/15  Yes Kelvin Cellar, MD  sitaGLIPtin (JANUVIA) 25 MG tablet Take 1 tablet (25 mg total) by mouth daily. 02/09/15  Yes Geradine Girt, DO  sodium chloride (OCEAN) 0.65 % SOLN nasal spray Place 1 spray into both nostrils 4 (four) times daily.   Yes Historical Provider, MD  tiotropium (SPIRIVA) 18  MCG inhalation capsule Place 1 capsule (18 mcg total) into inhaler and inhale daily. 11/16/14  Yes Juanito Doom, MD  zolpidem (AMBIEN) 10 MG tablet Take 1 tablet (10 mg total) by mouth at bedtime as needed for sleep. 09/06/15  Yes Oswald Hillock, MD  carvedilol (COREG) 25 MG tablet Take 1.5 tablets (37.5 mg total) by mouth 2 (two) times daily with a meal. Patient not taking: Reported on 09/23/2015 09/10/15   Brett Canales, PA-C  magnesium hydroxide (MILK OF MAGNESIA) 400 MG/5ML suspension Take 15 mLs by mouth daily as needed for mild constipation.    Historical Provider, MD  zinc oxide 20 % ointment Apply topically every Monday. 09/06/15   Oswald Hillock, MD   Allergies  Allergen Reactions  . Ace Inhibitors Cough    Cough  . Gabapentin Other (See Comments)    WAS BEHAVING VERY ODDLY    Review of Systems  Constitutional: Positive for fatigue.  Respiratory: Positive for chest tightness and shortness of breath.   Cardiovascular: Positive for chest pain and leg swelling.  Musculoskeletal: Positive for back pain and arthralgias.  All other systems reviewed and are negative.   Physical Exam  General: Alert, awake, in no acute distress.  HEENT: MM moist Heart: Regular rate and rhythm. No murmur appreciated.  Lungs: Good air movement, clear Abdomen: Soft, nontender, nondistended, positive bowel sounds.  Ext: Casts present Skin: Warm and dry Neuro: Grossly intact, nonfocal.   Vital Signs: BP 82/48 mmHg  Pulse 76  Temp(Src) 98.3 F (36.8 C) (Oral)  Resp 16  Ht '5\' 8"'$  (1.727 m)  Wt 69.8 kg (153 lb 14.1 oz)  BMI 23.40 kg/m2  SpO2 99%  SpO2: SpO2: 99 % O2 Device:SpO2: 99 % O2 Flow Rate: .O2 Flow Rate (L/min): 2 L/min (weaned to RA)  IO: Intake/output summary:  Intake/Output Summary (Last 24 hours) at 09/26/15 1613 Last data filed at 09/26/15 1435  Gross per 24 hour  Intake    360 ml  Output    975 ml  Net   -615 ml    LBM: Last BM Date: 09/24/15 Baseline Weight: Weight: 75.751  kg (167 lb) Most recent weight: Weight: 69.8 kg (153 lb 14.1 oz)      Palliative Assessment/Data:  Flowsheet Rows        Most Recent Value   Intake Tab    Referral Department  Cardiology   Unit at Time of Referral  Cardiac/Telemetry Unit   Palliative Care Primary Diagnosis  Cardiac   Date Notified  09/25/15   Palliative Care Type  Return patient Palliative Care   Reason for referral  Clarify Goals of Care, Counsel Regarding Hospice   Date of Admission  09/23/15   Date first seen by Palliative Care  09/26/15   # of days Palliative referral response time  1 Day(s)   # of days IP prior to Palliative referral  2   Clinical Assessment    Palliative Performance Scale Score  40%   Pain Max last 24 hours  6   Pain Min Last 24 hours  3   Dyspnea Max Last 24 Hours  5   Dyspnea Min Last 24 hours  3   Psychosocial & Spiritual Assessment    Palliative Care Outcomes    Patient/Family meeting held?  Yes   Who was at the meeting?  Patient, nephew, and wife   Palliative Care Outcomes  Clarified goals of care, Counseled regarding hospice, Changed CPR status   Palliative Care follow-up planned  Yes, Home      Additional Data Reviewed:  CBC:    Component Value Date/Time   WBC 11.5* 09/26/2015 0218   HGB 8.1* 09/26/2015 0218   HCT 26.8* 09/26/2015 0218   PLT 463* 09/26/2015 0218   MCV 80.7 09/26/2015 0218   NEUTROABS 9.3* 09/23/2015 0906   LYMPHSABS 1.9 09/23/2015 0906   MONOABS 0.6 09/23/2015 0906   EOSABS 0.1 09/23/2015 0906   BASOSABS 0.0 09/23/2015 0906   Comprehensive Metabolic Panel:    Component Value Date/Time   NA 136 09/26/2015 0218   K 3.3* 09/26/2015 0218   CL 93* 09/26/2015 0218   CO2 29 09/26/2015 0218   BUN 28* 09/26/2015 0218   CREATININE 1.30* 09/26/2015 0218   GLUCOSE 128* 09/26/2015 0218   CALCIUM 8.5* 09/26/2015 0218   AST 25 08/30/2015 2010   ALT 15* 08/30/2015 2010   ALKPHOS 57 08/30/2015 2010   BILITOT 0.4 08/30/2015 2010   PROT 7.1 08/30/2015 2010    ALBUMIN 2.5* 08/30/2015 2010     Time In: 0940 Time Out: 1055 Time Total: 75 Greater than 50%  of this time was spent counseling and coordinating care related to the above assessment and plan.  Signed by: Micheline Rough, MD  Micheline Rough, MD  09/26/2015, 4:13 PM  Please contact Palliative Medicine Team phone at [phone] for questions and concerns.

## 2015-09-26 NOTE — Progress Notes (Signed)
Utilization review completed.  

## 2015-09-26 NOTE — Progress Notes (Signed)
PROGRESS NOTE  Subjective:   Pt was admitted with angina and CHF    Troponins appear to be chronically elevated    Not a typical pattern for ACS  Doing better today  I have requested a Palliative care consult.   Objective:    Vital Signs:   Temp:  [98.2 F (36.8 C)-98.6 F (37 C)] 98.6 F (37 C) (03/14 0657) Pulse Rate:  [72-86] 79 (03/14 0917) Resp:  [12-20] 18 (03/14 0657) BP: (83-123)/(47-72) 123/66 mmHg (03/14 0917) SpO2:  [92 %-100 %] 99 % (03/14 0657)  Last BM Date: 09/24/15   24-hour weight change: Weight change:   Weight trends: Filed Weights   09/23/15 0857 09/23/15 1530 09/25/15 0219  Weight: 167 lb (75.751 kg) 162 lb 7.7 oz (73.7 kg) 153 lb 14.1 oz (69.8 kg)    Intake/Output:  03/13 0701 - 03/14 0700 In: 360 [P.O.:360] Out: 1550 [Urine:1550]     Physical Exam: BP 123/66 mmHg  Pulse 79  Temp(Src) 98.6 F (37 C) (Oral)  Resp 18  Ht 5\' 8"  (1.727 m)  Wt 153 lb 14.1 oz (69.8 kg)  BMI 23.40 kg/m2  SpO2 99%  Wt Readings from Last 3 Encounters:  09/25/15 153 lb 14.1 oz (69.8 kg)  09/10/15 167 lb (75.751 kg)  09/06/15 174 lb 3.2 oz (79.017 kg)    General: Vital signs reviewed and noted. Doing much better today   Head: Normocephalic, atraumatic.  Eyes: conjunctivae/corneas clear.  EOM's intact.   Throat: normal  Neck:    Lungs:    clesar   Heart:  RR   Abdomen:  Soft, non-tender, non-distended    Extremities: Bilateral casts    Neurologic: A&O X3, CN II - XII are grossly intact.   Psych: Normal     Labs: BMET:  Recent Labs  09/25/15 0954 09/26/15 0218  NA 135 136  K 3.0* 3.3*  CL 95* 93*  CO2 28 29  GLUCOSE 182* 128*  BUN 23* 28*  CREATININE 1.37* 1.30*  CALCIUM 8.6* 8.5*    Liver function tests: No results for input(s): AST, ALT, ALKPHOS, BILITOT, PROT, ALBUMIN in the last 72 hours. No results for input(s): LIPASE, AMYLASE in the last 72 hours.  CBC:  Recent Labs  09/25/15 0245 09/26/15 0218  WBC 13.5* 11.5*    HGB 7.8* 8.1*  HCT 26.4* 26.8*  MCV 81.0 80.7  PLT 491* 463*    Cardiac Enzymes: No results for input(s): CKTOTAL, CKMB, TROPONINI in the last 72 hours.  Coagulation Studies: No results for input(s): LABPROT, INR in the last 72 hours.  Other: Invalid input(s): POCBNP No results for input(s): DDIMER in the last 72 hours. No results for input(s): HGBA1C in the last 72 hours. No results for input(s): CHOL, HDL, LDLCALC, TRIG, CHOLHDL in the last 72 hours. No results for input(s): TSH, T4TOTAL, T3FREE, THYROIDAB in the last 72 hours.  Invalid input(s): FREET3 No results for input(s): VITAMINB12, FOLATE, FERRITIN, TIBC, IRON, RETICCTPCT in the last 72 hours.   Other results: EKG  ( personally reviewed )  -  Sinus tach, mild  ST depression   Medications:    Infusions:    Scheduled Medications: . atorvastatin  80 mg Oral Daily  . carvedilol  37.5 mg Oral BID WC  . Chlorhexidine Gluconate Cloth  6 each Topical Q0600  . clopidogrel  75 mg Oral Daily  . docusate sodium  100 mg Oral BID  . feeding supplement (ENSURE ENLIVE)  237  mL Oral BID BM  . ferrous sulfate  325 mg Oral Q breakfast  . fluticasone  1 spray Each Nare Daily  . isosorbide mononitrate  120 mg Oral Daily  . lactulose  15 g Oral Daily  . linagliptin  5 mg Oral Daily  . losartan  50 mg Oral Daily  . mometasone-formoterol  2 puff Inhalation BID  . montelukast  10 mg Oral QHS  . multivitamin with minerals  1 tablet Oral Daily  . mupirocin ointment  1 application Nasal BID  . omega-3 acid ethyl esters  1 g Oral Daily  . pantoprazole  40 mg Oral Daily  . polyethylene glycol  17 g Oral Daily  . potassium chloride  20 mEq Oral BID  . ranolazine  1,000 mg Oral BID  . rivaroxaban  20 mg Oral Q supper  . sertraline  25 mg Oral Daily  . sodium chloride  1 spray Each Nare QID  . tiotropium  18 mcg Inhalation Daily  . torsemide  20 mg Oral Daily  . zinc oxide   Topical Q Mon    Assessment/ Plan:   Active  Problems:   NSTEMI (non-ST elevated myocardial infarction) ([place])  1. CAD :   Troponin curve is flat.   Not suggestive of ACs.   I suspect he has chronic ischemia which is exacerbated when he gets fluid overloaded  Will DC NTG drip.   Start back on Imdur 120   Will get a palliative care consult. The wife will be here later in the AM.    It would be beneficial for her to be present for the palliative care consult  2. Acute on chronic diastolic chf Better after IV lasix Continue torsemide  3. DM  4.  Essential HTN  - BP is well controlled  5. hyplkalemia -    Wife has asked that I write a letter so that her grandson ( who is in prison) can have a day pass to come visit his grandfather .   Disposition: Palliative care consult   Length of Stay: 3  Thayer Headings, Brooke Bonito., MD, [facility] 09/26/2015, 10:21 AM Office [phone] Pager [phone]

## 2015-09-26 NOTE — Care Management Note (Signed)
Case Management Note Previous CM note initiated by Donne Anon RN, CM  Patient Details  Name: Henry [name] MRN: [id number] Date of Birth: [birth year]  Subjective/Objective:         Adm w mi           Action/Plan: from [place], sw aware   Expected Discharge Date:                Expected Discharge Plan:  Home w Hospice Care  In-House Referral:  Clinical Social Work  Discharge planning Services  CM Consult  Post Acute Care Choice:  Home Health, Durable Medical Equipment Choice offered to:  Patient, Spouse  DME Arranged:    DME Agency:     HH Arranged:  Disease Management, RN [place] Agency:  [facility]  Status of Service:  In process, will continue to follow  Medicare Important Message Given:    Date Medicare IM Given:    Medicare IM give by:    Date Additional Medicare IM Given:    Additional Medicare Important Message give by:     If discussed at [place] of Stay Meetings, dates discussed:    Additional Comments:sw alerted cm that pt from snf  09/26/15- 1600- Marvetta Gibbons RN, BSN- referral received for home hospice- in to speak with pt- wife had already left to return home for the day- per conversation with pt he states that he and wife have decided for pt to return home with hospice- pt somewhat tearful but states that he "is ready"- pt states that his wife is at home "getting things ready for him to come home". Pt has been at [facility]- so pt will need DME for home, would like hospital bed, and will need home 02- discussed home hospice choice- pt states that they would like to use [facility] out [facility] [place]- asked pt if he would like CM to call his wife- he states that she will be back in the AM- pt gives permission for CM to go ahead and make referral to [facility] will return in the AM to further discuss d/c needs when wife returns- call made to [facility] and referral made for [place]-  spoke with [place]- faxed H&P and facesheet to [phone]- will fax completed PC note in am.   Dawayne Patricia, RN 09/26/2015, 4:41 PM

## 2015-09-27 DIAGNOSIS — E11621 Type 2 diabetes mellitus with foot ulcer: Secondary | ICD-10-CM | POA: Diagnosis present

## 2015-09-27 DIAGNOSIS — I25111 Atherosclerotic heart disease of native coronary artery with angina pectoris with documented spasm: Secondary | ICD-10-CM

## 2015-09-27 DIAGNOSIS — Z66 Do not resuscitate: Secondary | ICD-10-CM | POA: Diagnosis present

## 2015-09-27 DIAGNOSIS — Z515 Encounter for palliative care: Secondary | ICD-10-CM | POA: Insufficient documentation

## 2015-09-27 DIAGNOSIS — L97509 Non-pressure chronic ulcer of other part of unspecified foot with unspecified severity: Secondary | ICD-10-CM

## 2015-09-27 DIAGNOSIS — R079 Chest pain, unspecified: Secondary | ICD-10-CM | POA: Insufficient documentation

## 2015-09-27 DIAGNOSIS — J810 Acute pulmonary edema: Secondary | ICD-10-CM | POA: Diagnosis present

## 2015-09-27 DIAGNOSIS — I5033 Acute on chronic diastolic (congestive) heart failure: Secondary | ICD-10-CM | POA: Diagnosis present

## 2015-09-27 LAB — CBC
HEMATOCRIT: 25.8 % — AB (ref 39.0–52.0)
HEMOGLOBIN: 7.8 g/dL — AB (ref 13.0–17.0)
MCH: 24.3 pg — ABNORMAL LOW (ref 26.0–34.0)
MCHC: 30.2 g/dL (ref 30.0–36.0)
MCV: 80.4 fL (ref 78.0–100.0)
Platelets: 459 10*3/uL — ABNORMAL HIGH (ref 150–400)
RBC: 3.21 MIL/uL — ABNORMAL LOW (ref 4.22–5.81)
RDW: 17.4 % — ABNORMAL HIGH (ref 11.5–15.5)
WBC: 10 10*3/uL (ref 4.0–10.5)

## 2015-09-27 LAB — BASIC METABOLIC PANEL
ANION GAP: 11 (ref 5–15)
BUN: 31 mg/dL — ABNORMAL HIGH (ref 6–20)
CHLORIDE: 95 mmol/L — AB (ref 101–111)
CO2: 28 mmol/L (ref 22–32)
Calcium: 8.4 mg/dL — ABNORMAL LOW (ref 8.9–10.3)
Creatinine, Ser: 1.31 mg/dL — ABNORMAL HIGH (ref 0.61–1.24)
GFR calc non Af Amer: 52 mL/min — ABNORMAL LOW (ref >60)
GFR, EST AFRICAN AMERICAN: 60 mL/min — AB (ref >60)
Glucose, Bld: 138 mg/dL — ABNORMAL HIGH (ref 65–99)
Potassium: 3.6 mmol/L (ref 3.5–5.1)
Sodium: 134 mmol/L — ABNORMAL LOW (ref 135–145)

## 2015-09-27 MED ORDER — ENSURE ENLIVE PO LIQD: 237.0000 mL | Freq: Two times a day (BID) | ORAL | Status: AC

## 2015-09-27 MED ORDER — HALOPERIDOL LACTATE 2 MG/ML PO CONC: 1.0000 mg | ORAL | Status: AC | PRN

## 2015-09-27 MED ORDER — LORAZEPAM 2 MG/ML PO CONC: 1.0000 mg | ORAL | Status: AC | PRN

## 2015-09-27 MED ORDER — TORSEMIDE 20 MG PO TABS: 20.0000 mg | ORAL_TABLET | Freq: Every day | ORAL | Status: AC

## 2015-09-27 MED ORDER — POTASSIUM CHLORIDE CRYS ER 10 MEQ PO TBCR: 20.0000 meq | EXTENDED_RELEASE_TABLET | Freq: Every day | ORAL | Status: AC

## 2015-09-27 MED ORDER — MORPHINE SULFATE (CONCENTRATE) 10 MG /0.5 ML PO SOLN: 5.0000 mg | ORAL | Status: AC | PRN

## 2015-09-27 NOTE — Progress Notes (Signed)
Subjective:  Still a little SOB and still having some chest pain. Unable to ambulate secondary to bilateral diabetic foot ulcers  Objective:  Vital Signs in the last 24 hours: Temp:  [98 F (36.7 C)-98.3 F (36.8 C)] 98 F (36.7 C) (03/15 0420) Pulse Rate:  [69-79] 72 (03/15 0420) Resp:  [16-18] 18 (03/15 0420) BP: (82-123)/(45-66) 105/54 mmHg (03/15 0420) SpO2:  [99 %-100 %] 100 % (03/15 0420)  Intake/Output from previous day:  Intake/Output Summary (Last 24 hours) at 09/27/15 0838 Last data filed at 09/27/15 0815  Gross per 24 hour  Intake    720 ml  Output    650 ml  Net     70 ml    Physical Exam: General appearance: alert, cooperative, no distress and on O2 Lungs: decreased at bases, no rales Heart: regular rate and rhythm Extremities: bilateral leg dressings   Rate: 78  Rhythm: normal sinus rhythm  Lab Results:  Recent Labs  09/26/15 0218 09/27/15 0250  WBC 11.5* 10.0  HGB 8.1* 7.8*  PLT 463* 459*    Recent Labs  09/26/15 0218 09/27/15 0250  NA 136 134*  K 3.3* 3.6  CL 93* 95*  CO2 29 28  GLUCOSE 128* 138*  BUN 28* 31*  CREATININE 1.30* 1.31*   No results for input(s): TROPONINI in the last 72 hours.  Invalid input(s): CK, MB No results for input(s): INR in the last 72 hours.  Scheduled Meds: . atorvastatin  80 mg Oral Daily  . carvedilol  37.5 mg Oral BID WC  . Chlorhexidine Gluconate Cloth  6 each Topical Q0600  . clopidogrel  75 mg Oral Daily  . docusate sodium  100 mg Oral BID  . feeding supplement (ENSURE ENLIVE)  237 mL Oral BID BM  . ferrous sulfate  325 mg Oral Q breakfast  . fluticasone  1 spray Each Nare Daily  . isosorbide mononitrate  120 mg Oral Daily  . lactulose  15 g Oral Daily  . linagliptin  5 mg Oral Daily  . losartan  50 mg Oral Daily  . mometasone-formoterol  2 puff Inhalation BID  . montelukast  10 mg Oral QHS  . multivitamin with minerals  1 tablet Oral Daily  . mupirocin ointment  1 application Nasal BID   . omega-3 acid ethyl esters  1 g Oral Daily  . pantoprazole  40 mg Oral Daily  . polyethylene glycol  17 g Oral Daily  . potassium chloride  20 mEq Oral BID  . ranolazine  1,000 mg Oral BID  . rivaroxaban  20 mg Oral Q supper  . sertraline  25 mg Oral Daily  . sodium chloride  1 spray Each Nare QID  . tiotropium  18 mcg Inhalation Daily  . torsemide  20 mg Oral Daily  . zinc oxide   Topical Q Mon   Continuous Infusions:  PRN Meds:.acetaminophen, albuterol, ALPRAZolam, alum & mag hydroxide-simeth, HYDROcodone-acetaminophen, magnesium hydroxide, nitroGLYCERIN, ondansetron (ZOFRAN) IV, zolpidem   Imaging: Imaging results have been reviewed   Assessment/Plan:   Principal Problem:   Acute on chronic diastolic heart failure (HCC) Active Problems:   DNR (do not resuscitate)   Anemia   CKD (chronic kidney disease), stage III   Type 2 diabetes mellitus with circulatory disorder (HCC)   Chronic diastolic CHF (congestive heart failure) (HCC)   Hx of CABG x3   Diabetic foot ulcers ([place])   Essential hypertension, benign   Dyslipidemia   Long term current  use of anticoagulant therapy   History of pulmonary embolism   Lupus anticoagulant positive   PLAN: Per Dr Acie Fredrickson- his B/P is running low- we may need to cut back on his medications. Home with Hospice when medications stabilized.   Kerin Ransom PA-C 09/27/2015, 8:38 AM [phone]  Attending Note:   The patient was seen and examined.  Agree with assessment and plan as noted above.  Changes made to the above note as needed.  1. Acute on chronic  diastolic CHF: His Troponin levels are flat and so this was probably troponin elevation caused by CHF exacerbation and not a NSTEMI. 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    Has been seen by Palliative care.      Thayer Headings, Brooke Bonito., MD, [facility] 09/27/2015, 9:59 AM [address]  [place] Pager [phone]

## 2015-09-27 NOTE — Progress Notes (Signed)
Pt O2 sat on RA remained 95% or better.  Pt remained on RA.  Will continue to monitor.

## 2015-09-27 NOTE — Care Management Note (Addendum)
Case Management Note Previous CM note initiated by Donne Anon RN, CM  Patient Details  Name: Henry [name] MRN: [id number] Date of Birth: [birth year]  Subjective/Objective:         Adm w mi           Action/Plan: from [place], sw aware   Expected Discharge Date:  09/27/15              Expected Discharge Plan:  Home w Hospice Care  In-House Referral:  Clinical Social Work  Discharge planning Services  CM Consult  Post Acute Care Choice:  Home Health, Durable Medical Equipment Choice offered to:  Patient, Spouse  DME Arranged:  Hospital bed, Oxygen DME Agency:  [place] [facility]  HH Arranged:  Disease Management, RN [facility] Agency:  [facility]  Status of Service:  Completed, signed off  Medicare Important Message Given:    Date Medicare IM Given:    Medicare IM give by:    Date Additional Medicare IM Given:    Additional Medicare Important Message give by:     If discussed at [place] of Stay Meetings, dates discussed:  09/26/15  Discharge Disposition: Home/home hospice   Additional Comments:sw alerted cm that pt from snf  09/27/15- 1630- Marvetta Gibbons RN, bSN- [facility]- notified Hospice that transport had been called- d/c summary faxed to Hospice.   09/27/15- [place] RN, BSN- pt for d/c home today with Hospice- in to speak with pt and wife at bedside- DME to be delivered today by [employer] this afternoon- wife to return home for DME delivery- have spoken with Olean Ree at [facility] to notify of discharge for today- they will be ready to admit pt once home and have made arrangements for DME- have asked for any scripts to be faxed to [place]- have spoken with PA- Kerin Ransom- scripts to be done by [facility]- gold DNR has been signed- plan for pt to transport home via ambulance- prescriptions have been faxed to pharmacy at [phone]- will call for transport  once DME has been delivered - estimated time for delivery is around 3:00 pm.   09/26/15- 1600- Marvetta Gibbons RN, BSN- referral received for home hospice- in to speak with pt- wife had already left to return home for the day- per conversation with pt he states that he and wife have decided for pt to return home with hospice- pt somewhat tearful but states that he "is ready"- pt states that his wife is at home "getting things ready for him to come home". Pt has been at [facility]- so pt will need DME for home, would like hospital bed, and will need home 02- discussed home hospice choice- pt states that they would like to use [facility] out of [place]- asked pt if he would like CM to call his wife- he states that she will be back in the AM- pt gives permission for CM to go ahead and make referral to [facility] will return in the AM to further discuss d/c needs when wife returns- call made to [facility] and referral made for [place]- spoke with [place]- faxed H&P and facesheet to [phone]- will fax completed PC note in am.   Dawayne Patricia, RN 09/27/2015, 2:20 PM

## 2015-09-27 NOTE — Discharge Summary (Signed)
Patient ID: Henry [name],  MRN: [id number], DOB/AGE: 1941/06/04 75 y.o.  Admit date: 09/23/2015 Discharge date: 09/27/2015  Primary Care Provider: Sherrie Mustache, MD Primary Cardiologist: Dr Acie Fredrickson  Discharge Diagnoses Principal Problem:   Coronary artery disease involving native coronary artery of native heart with angina pectoris with documented spasm [facility]) Active Problems:   DNR (do not resuscitate)   Acute on chronic diastolic heart failure ([place])   Flash pulmonary edema (HCC)   Anemia   CKD (chronic kidney disease), stage III   Type 2 diabetes mellitus with circulatory disorder (HCC)   Chronic diastolic CHF (congestive heart failure) (HCC)   Hx of CABG x3   Diabetic foot ulcers (HCC)   Essential hypertension, benign   Dyslipidemia   Long term current use of anticoagulant therapy   History of pulmonary embolism   Lupus anticoagulant positive   History of gastrointestinal bleeding   History of CVA (cerebrovascular accident)     Hospital Course:  75 y.o. year old male with a history of CAD, 3 v CABG 2005 w/ known graft disease, NSTEMI in 07/2010 with DES to the Cx,  HTN, DM and recurrent PE on Xarelto. Other hx is COPD and Lupus. He was admitted 02/12-02/14 for chest pain and medical med rx was recommended. He was admitted 02/15-02/22/17 for CVA.  Cards saw for elevated troponin and chest pain. The pt had a cath done 09/05/15, results below, med rx recommended. Cath showed severe calcified LCx stenosis, a very complex lesion. The RCA is occluded and fillsfrom left to right collaterals. LIMA-LAD is his only patent graft.  He was admitted again Feb. 24-2/26 for chest pain and congestive heart failure. Dr Acie Fredrickson has reviewed his angiogram with multiple internventionalist and his circumflex stenosis is not approachable by PCI. Since then he has also had an admission to [facility] for his diabetic leg ulcers.           He is now admitted on March 11 with chest  discomfort/pressure, congestive heart failure and respiratory failure. We had discussed hospice and paliative care during his last admission. The pts BNP was 1089. His Troponin appeared now to be chronically elevated. We adjusted his medications to try and relieve his angina. He was seen by Palliative Care and the plan is for Hospice at home. He is a DNR. Dr Acie Fredrickson feels he can be discharged 09/27/15.   Discharge Vitals:  Blood pressure 93/49, pulse 73, temperature 98.2 F (36.8 C), temperature source Oral, resp. rate 18, height 5\' 8"  (1.727 m), weight 153 lb 14.1 oz (69.8 kg), SpO2 95 %.    Labs: Results for orders placed or performed during the hospital encounter of 09/23/15 (from the past 24 hour(s))  CBC     Status: Abnormal   Collection Time: 09/27/15  2:50 AM  Result Value Ref Range   WBC 10.0 4.0 - 10.5 K/uL   RBC 3.21 (L) 4.22 - 5.81 MIL/uL   Hemoglobin 7.8 (L) 13.0 - 17.0 g/dL   HCT 25.8 (L) 39.0 - 52.0 %   MCV 80.4 78.0 - 100.0 fL   MCH 24.3 (L) 26.0 - 34.0 pg   MCHC 30.2 30.0 - 36.0 g/dL   RDW 17.4 (H) 11.5 - 15.5 %   Platelets 459 (H) 150 - 400 K/uL  Basic metabolic panel     Status: Abnormal   Collection Time: 09/27/15  2:50 AM  Result Value Ref Range   Sodium 134 (L) 135 - 145 mmol/L  Potassium 3.6 3.5 - 5.1 mmol/L   Chloride 95 (L) 101 - 111 mmol/L   CO2 28 22 - 32 mmol/L   Glucose, Bld 138 (H) 65 - 99 mg/dL   BUN 31 (H) 6 - 20 mg/dL   Creatinine, Ser 1.31 (H) 0.61 - 1.24 mg/dL   Calcium 8.4 (L) 8.9 - 10.3 mg/dL   GFR calc non Af Amer 52 (L) >60 mL/min   GFR calc Af Amer 60 (L) >60 mL/min   Anion gap 11 5 - 15    Disposition:  Follow-up Information    Follow up with [facility].   Contact information:   [address] [place] [address] [phone]       Discharge Medications:    Medication List    STOP taking these medications        furosemide 20 MG tablet  Commonly known as:  LASIX     isosorbide mononitrate 120 MG 24 hr  tablet  Commonly known as:  IMDUR      TAKE these medications        acetaminophen 325 MG tablet  Commonly known as:  TYLENOL  Take 650 mg by mouth every 4 (four) hours as needed for mild pain. For pain     atorvastatin 80 MG tablet  Commonly known as:  LIPITOR  Take 1 tablet (80 mg total) by mouth daily.     budesonide-formoterol 160-4.5 MCG/ACT inhaler  Commonly known as:  SYMBICORT  Inhale 2 puffs into the lungs 2 (two) times daily.     carvedilol 25 MG tablet  Commonly known as:  COREG  Take 1.5 tablets (37.5 mg total) by mouth 2 (two) times daily with a meal.     clopidogrel 75 MG tablet  Commonly known as:  PLAVIX  Take 1 tablet (75 mg total) by mouth daily.     Co Q 10 100 MG Caps  Take 100 mg by mouth daily.     docusate sodium 100 MG capsule  Commonly known as:  COLACE  Take 100 mg by mouth 2 (two) times daily.     feeding supplement (ENSURE ENLIVE) Liqd  Take 237 mLs by mouth 2 (two) times daily between meals.     ferrous sulfate 325 (65 FE) MG tablet  Take 325 mg by mouth daily with breakfast.     fish oil-omega-3 fatty acids 1000 MG capsule  Take 2 g by mouth every morning.     fluticasone 50 MCG/ACT nasal spray  Commonly known as:  FLONASE  Place 1 spray into both nostrils daily.     haloperidol 2 MG/ML solution  Commonly known as:  HALDOL  Take 0.5 mLs (1 mg total) by mouth every 4 (four) hours as needed for agitation.     HYDROcodone-acetaminophen 7.5-325 MG tablet  Commonly known as:  NORCO  Take 1 tablet by mouth every 6 (six) hours as needed for moderate pain.     lactulose 10 GM/15ML solution  Commonly known as:  CHRONULAC  Take 15 g by mouth daily.     LORazepam 2 MG/ML concentrated solution  Commonly known as:  LORAZEPAM INTENSOL  Take 0.5 mLs (1 mg total) by mouth every 4 (four) hours as needed for anxiety.     losartan 50 MG tablet  Commonly known as:  COZAAR  Take 50 mg by mouth daily.     magnesium hydroxide 400 MG/5ML  suspension  Commonly known as:  MILK OF MAGNESIA  Take  15 mLs by mouth daily as needed for mild constipation.     montelukast 10 MG tablet  Commonly known as:  SINGULAIR  Take 10 mg by mouth at bedtime.     morphine CONCENTRATE 10 mg / 0.5 ml concentrated solution  Take 0.25 mLs (5 mg total) by mouth every 2 (two) hours as needed for severe pain or shortness of breath.     nitroGLYCERIN 0.4 MG SL tablet  Commonly known as:  NITROSTAT  Place 1 tablet (0.4 mg total) under the tongue every 5 (five) minutes x 3 doses as needed. For chest pain     omeprazole 40 MG capsule  Commonly known as:  PRILOSEC  Take 40 mg by mouth every evening.     polyethylene glycol packet  Commonly known as:  MIRALAX / GLYCOLAX  Take 17 g by mouth daily.     potassium chloride 10 MEQ tablet  Commonly known as:  K-DUR,KLOR-CON  Take 2 tablets (20 mEq total) by mouth daily.     PROAIR HFA 108 (90 Base) MCG/ACT inhaler  Generic drug:  albuterol  Inhale 2 puffs into the lungs every 6 (six) hours as needed for wheezing or shortness of breath.     ranolazine 1000 MG SR tablet  Commonly known as:  RANEXA  Take 1 tablet (1,000 mg total) by mouth 2 (two) times daily.     rivaroxaban 20 MG Tabs tablet  Commonly known as:  XARELTO  Take 20 mg by mouth daily.     sertraline 25 MG tablet  Commonly known as:  ZOLOFT  Take 1 tablet (25 mg total) by mouth daily.     sitaGLIPtin 25 MG tablet  Commonly known as:  JANUVIA  Take 1 tablet (25 mg total) by mouth daily.     sodium chloride 0.65 % Soln nasal spray  Commonly known as:  OCEAN  Place 1 spray into both nostrils 4 (four) times daily.     tiotropium 18 MCG inhalation capsule  Commonly known as:  SPIRIVA  Place 1 capsule (18 mcg total) into inhaler and inhale daily.     torsemide 20 MG tablet  Commonly known as:  DEMADEX  Take 1 tablet (20 mg total) by mouth daily.     zinc oxide 20 % ointment  Apply topically every Monday.     zolpidem 10 MG  tablet  Commonly known as:  AMBIEN  Take 1 tablet (10 mg total) by mouth at bedtime as needed for sleep.         Duration of Discharge Encounter: Greater than 30 minutes including physician time.  Angelena Form PA-C 09/27/2015 2:42 PM   Attending Note:   The patient was seen and examined.  Agree with assessment and plan as noted above.  Changes made to the above note as needed.  Pt was seen on the day of DC.   He will go home with Palliative care and home Hospice. i have spent 40 minutes of face to face time with family and patient.   Thayer Headings, Brooke Bonito., MD, [facility] 10/02/2015, 1:14 PM [address]  [place] Pager [phone]

## 2015-09-27 NOTE — Clinical Documentation Improvement (Signed)
Cardiology  "Respiratory Failure" is documented in the current medical record.  In order to provide greater specificity, please document the Acuity and Type of respiratory failure in the progress notes and discharge summary: - Acute; Acute on Chronic; Chronic - Hypoxic; Hypercarbic; Combined hypoxic and hypercapneic - Other acuity and type - Unable to clinically determine the acuity or type  Clinical Information/Indicators: "Patient is a 75 y.o. male with a PMHx of Inoperable coronary artery disease, Congestive heart failure, hypertension, who was admitted to [facility] on 09/23/2015 for evaluation of florid pulmonary edema and respiratory failure." documented in H&P "He is acutely ill. He has marked respiratory distress" is documented in H&P  Bipap was started and continued over the next 2-3 hours per ED provider. Respiratory rate in ED ranged in the mid 20's to upper 30's per flowsheets O2 was started at 2 liters and was titrated up to 80% Bipap per flowsheets  Please exercise your independent, professional judgment when responding. A specific answer is not anticipated or expected.   Thank You, Erling Conte  RN BSN CCDS [phone] Health Information Management [place]

## 2015-09-27 NOTE — Progress Notes (Signed)
Daily Progress Note   Patient Name: Henry [name]       Date: 09/27/2015 DOB: 1940/09/11  Age: 75 y.o. MRN#: [id number] Attending Physician: Thayer Headings, MD Primary Care Physician: Sherrie Mustache, MD Admit Date: 09/23/2015  Reason for Consultation/Follow-up: Establishing goals of care  Subjective: Henry [name] reports that he is feeling fairly well this morning is excited about the possibility of going home later today.  He states that he currently is feeling well in regard to his chest pain and breathing. He denies any other needs at this time.  Length of Stay: 4 days  Current Medications: Scheduled Meds:  . atorvastatin  80 mg Oral Daily  . carvedilol  37.5 mg Oral BID WC  . Chlorhexidine Gluconate Cloth  6 each Topical Q0600  . clopidogrel  75 mg Oral Daily  . docusate sodium  100 mg Oral BID  . feeding supplement (ENSURE ENLIVE)  237 mL Oral BID BM  . ferrous sulfate  325 mg Oral Q breakfast  . fluticasone  1 spray Each Nare Daily  . isosorbide mononitrate  120 mg Oral Daily  . lactulose  15 g Oral Daily  . linagliptin  5 mg Oral Daily  . losartan  50 mg Oral Daily  . mometasone-formoterol  2 puff Inhalation BID  . montelukast  10 mg Oral QHS  . multivitamin with minerals  1 tablet Oral Daily  . mupirocin ointment  1 application Nasal BID  . omega-3 acid ethyl esters  1 g Oral Daily  . pantoprazole  40 mg Oral Daily  . polyethylene glycol  17 g Oral Daily  . potassium chloride  20 mEq Oral BID  . ranolazine  1,000 mg Oral BID  . rivaroxaban  20 mg Oral Q supper  . sertraline  25 mg Oral Daily  . sodium chloride  1 spray Each Nare QID  . tiotropium  18 mcg Inhalation Daily  . torsemide  20 mg Oral Daily  . zinc oxide   Topical Q Mon    Continuous  Infusions:    PRN Meds: acetaminophen, albuterol, ALPRAZolam, alum & mag hydroxide-simeth, HYDROcodone-acetaminophen, magnesium hydroxide, nitroGLYCERIN, ondansetron (ZOFRAN) IV, zolpidem  Physical Exam: Physical Exam    General: Alert, awake, in no acute distress.  HEENT: No bruits, no goiter, no JVD Heart:  Regular rate and rhythm. No murmur appreciated. Lungs: Good air movement, clear Abdomen: Soft, nontender, nondistended, positive bowel sounds.  Ext: No significant edema Skin: Warm and dry Neuro: Grossly intact, nonfocal.            Vital Signs: BP 93/49 mmHg  Pulse 73  Temp(Src) 98.2 F (36.8 C) (Oral)  Resp 18  Ht 5\' 8"  (1.727 m)  Wt 69.8 kg (153 lb 14.1 oz)  BMI 23.40 kg/m2  SpO2 95% SpO2: SpO2: 95 % O2 Device: O2 Device: Not Delivered O2 Flow Rate: O2 Flow Rate (L/min): 2 L/min  Intake/output summary:  Intake/Output Summary (Last 24 hours) at 09/27/15 1629 Last data filed at 09/27/15 1300  Gross per 24 hour  Intake    600 ml  Output   1025 ml  Net   -425 ml   LBM: Last BM Date: 09/24/15 Baseline Weight: Weight: 75.751 kg (167 lb) Most recent weight: Weight: 69.8 kg (153 lb 14.1 oz)       Palliative Assessment/Data: Flowsheet Rows        Most Recent Value   Intake Tab    Referral Department  Cardiology   Unit at Time of Referral  Cardiac/Telemetry Unit   Palliative Care Primary Diagnosis  Cardiac   Date Notified  09/25/15   Palliative Care Type  Return patient Palliative Care   Reason for referral  Clarify Goals of Care, Counsel Regarding Hospice   Date of Admission  09/23/15   Date first seen by Palliative Care  09/26/15   # of days Palliative referral response time  1 Day(s)   # of days IP prior to Palliative referral  2   Clinical Assessment    Palliative Performance Scale Score  40%   Pain Max last 24 hours  6   Pain Min Last 24 hours  3   Dyspnea Max Last 24 Hours  5   Dyspnea Min Last 24 hours  3   Psychosocial & Spiritual Assessment     Palliative Care Outcomes    Patient/Family meeting held?  Yes   Who was at the meeting?  Patient, nephew, and wife   Palliative Care Outcomes  Clarified goals of care, Counseled regarding hospice, Changed CPR status   Palliative Care follow-up planned  Yes, Home      Additional Data Reviewed: CBC    Component Value Date/Time   WBC 10.0 09/27/2015 0250   RBC 3.21* 09/27/2015 0250   HGB 7.8* 09/27/2015 0250   HCT 25.8* 09/27/2015 0250   PLT 459* 09/27/2015 0250   MCV 80.4 09/27/2015 0250   MCH 24.3* 09/27/2015 0250   MCHC 30.2 09/27/2015 0250   RDW 17.4* 09/27/2015 0250   LYMPHSABS 1.9 09/23/2015 0906   MONOABS 0.6 09/23/2015 0906   EOSABS 0.1 09/23/2015 0906   BASOSABS 0.0 09/23/2015 0906    CMP     Component Value Date/Time   NA 134* 09/27/2015 0250   K 3.6 09/27/2015 0250   CL 95* 09/27/2015 0250   CO2 28 09/27/2015 0250   GLUCOSE 138* 09/27/2015 0250   BUN 31* 09/27/2015 0250   CREATININE 1.31* 09/27/2015 0250   CALCIUM 8.4* 09/27/2015 0250   PROT 7.1 08/30/2015 2010   ALBUMIN 2.5* 08/30/2015 2010   AST 25 08/30/2015 2010   ALT 15* 08/30/2015 2010   ALKPHOS 57 08/30/2015 2010   BILITOT 0.4 08/30/2015 2010   GFRNONAA 52* 09/27/2015 0250   GFRAA 60* 09/27/2015 0250       Problem  List:  Patient Active Problem List   Diagnosis Date Noted  . DNR (do not resuscitate) 09/27/2015  . Acute on chronic diastolic heart failure ([place]) 09/27/2015  . Diabetic foot ulcers ([place]) 09/27/2015  . Flash pulmonary edema ([place])   . CAD with angina   . NSTEMI (non-ST elevated myocardial infarction) ([place]) 09/23/2015  . Hx of CABG x3 09/10/2015  . Severe protein-calorie malnutrition ([place]) 09/10/2015  . Encounter for palliative care   . Goals of care, counseling/discussion   . Angina pectoris ([place]) 09/07/2015  . History of CVA (cerebrovascular accident)   . ACS (acute coronary syndrome) ([place])   . Thrombocytosis ([place]) 08/31/2015  . Hyponatremia 08/31/2015  . Chronic diastolic  CHF (congestive heart failure) ([place]) 08/31/2015  . Acute ischemic stroke ([place])   . Type 2 diabetes mellitus with circulatory disorder ([place])   . Chest pain 08/27/2015  . Lactic acidosis   . Troponin level elevated   . AKI (acute kidney injury) ([place]) 02/05/2015  . Elevated lactic acid level 02/05/2015  . History of gastrointestinal bleeding 02/05/2015  . COPD exacerbation ([place]) 04/12/2014  . COPD, moderate ([place]) 03/15/2014  . 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 Right carotid bruit 12/18/2013  . CKD (chronic kidney disease), stage III 12/18/2013  . History of pulmonary embolism   . Pulmonary nodule   . Lupus anticoagulant positive   . Anemia 12/17/2013  . Long term current use of anticoagulant therapy 12/17/2011  . Dyslipidemia 12/14/2011  . Essential hypertension, benign 04/11/2009     Palliative Care Assessment & Plan    1.Code Status:  DNR    Code Status Orders        Start     Ordered   09/26/15 1416  Do not attempt resuscitation (DNR)   Continuous    Question Answer Comment  In the event of cardiac or respiratory ARREST Do not call a "code blue"   In the event of cardiac or respiratory ARREST Do not perform Intubation, CPR, defibrillation or ACLS   In the event of cardiac or respiratory ARREST Use medication by any route, position, wound care, and other measures to relive pain and suffering. May use oxygen, suction and manual treatment of airway obstruction as needed for comfort.      09/26/15 1428    Code Status History    Date Active Date Inactive Code Status Order ID Comments User Context   09/07/2015  7:51 PM 09/10/2015  5:00 PM Full Code [id number]  Lonn Georgia, PA-C ED   08/31/2015  1:03 AM 09/06/2015  9:23 PM Full Code [id number]  Vianne Bulls, MD Inpatient   08/27/2015 10:13 PM 08/30/2015  9:25 PM Full Code [id number]  Kelvin Cellar, MD Inpatient   02/05/2015  7:55 PM 02/09/2015  6:22 PM Full Code [id number]  Caren Griffins, MD ED   12/18/2013  1:45 AM  12/20/2013  4:51 PM Full Code [id number]  Merton Border, MD Inpatient   12/08/2011 12:06 PM 12/14/2011  7:32 PM Full Code [id number]  Ezra Sites, MD ED       2. Goals of Care/Additional Recommendations:  Plan for discharge home with hospice support  Limitations on Scope of Treatment: Avoid Hospitalization  3. Symptom Management:      1. I placed scripts on the chart for: - Morphine Concentrate 10mg /0.16ml: 5mg  (0.60ml) sublingual every 1 hour as needed for pain or shortness of breath: Disp 59ml - Lorazepam 2mg /ml concentrated solution: 1mg  (0.60ml) sublingual every 4 hours  as needed for anxiety: Disp 57ml - Haldol 2mg /ml solution: 0.5mg  (0.40ml) sublingual every 4 hours as needed for agitation or nausea: Disp 79ml    4. Palliative Prophylaxis:   Aspiration and Frequent Pain Assessment  5. Prognosis:< 6 months  6. Discharge Planning:  Home with Hospice   Care plan was discussed with patient, his wife, his nephew, and Dr. Cathie Olden  Thank you for allowing the Palliative Medicine Team to assist in the care of this patient.   Time In: 1000 Time Out: 1020 Total Time 20 Prolonged Time Billed No        Micheline Rough, MD  09/27/2015, 4:29 PM  Please contact Palliative Medicine Team phone at [phone] for questions and concerns.

## 2015-09-27 NOTE — Discharge Instructions (Signed)
Information on my medicine - XARELTO® (rivaroxaban) ° °This medication education was reviewed with me or my healthcare representative as part of my discharge preparation.  The pharmacist that spoke with me during my hospital stay was:  Yohannes Waibel Dien, RPH ° °WHY WAS XARELTO® PRESCRIBED FOR YOU? °Xarelto® was prescribed to treat blood clots that may have been found in the veins of your legs (deep vein thrombosis) or in your lungs (pulmonary embolism) and to reduce the risk of them occurring again. ° °What do you need to know about Xarelto®? °The dose is 20 mg tablet taken ONCE A DAY with your evening meal. ° °DO NOT stop taking Xarelto® without talking to the health care provider who prescribed the medication.  Refill your prescription for 20 mg tablets before you run out. ° °After discharge, you should have regular check-up appointments with your healthcare provider that is prescribing your Xarelto®.  In the future your dose may need to be changed if your kidney function changes by a significant amount. ° °What do you do if you miss a dose? °If you are taking Xarelto® TWICE DAILY and you miss a dose, take it as soon as you remember. You may take two 15 mg tablets (total 30 mg) at the same time then resume your regularly scheduled 15 mg twice daily the next day. ° °If you are taking Xarelto® ONCE DAILY and you miss a dose, take it as soon as you remember on the same day then continue your regularly scheduled once daily regimen the next day. Do not take two doses of Xarelto® at the same time.  ° °Important Safety Information °Xarelto® is a blood thinner medicine that can cause bleeding. You should call your healthcare provider right away if you experience any of the following: °? Bleeding from an injury or your nose that does not stop. °? Unusual colored urine (red or dark brown) or unusual colored stools (red or black). °? Unusual bruising for unknown reasons. °? A serious fall or if you hit your head (even if there  is no bleeding). ° °Some medicines may interact with Xarelto® and might increase your risk of bleeding while on Xarelto®. To help avoid this, consult your healthcare provider or pharmacist prior to using any new prescription or non-prescription medications, including herbals, vitamins, non-steroidal anti-inflammatory drugs (NSAIDs) and supplements. ° °This website has more information on Xarelto®: www.xarelto.com. ° °

## 2015-09-27 NOTE — Care Management Important Message (Signed)
Important Message  Patient Details  Name: FAMOUS SPELLER MRN: [id number] Date of Birth: Sep 05, 1940   Medicare Important Message Given:  Yes    Dawayne Patricia, RN 09/27/2015, 7:54 PM

## 2015-10-18 ENCOUNTER — Encounter: Payer: Medicare Other | Admitting: Cardiovascular Disease

## 2015-12-14 DEATH — deceased
